# Patient Record
Sex: Female | Born: 1945 | Race: White | Hispanic: No | Marital: Single | Smoking: Current every day smoker
Health system: Southern US, Community
[De-identification: ages and names within clinical notes are randomized; demographics above are authoritative.]

## PROBLEM LIST (undated history)

## (undated) DIAGNOSIS — C439 Malignant melanoma of skin, unspecified: Secondary | ICD-10-CM

## (undated) DIAGNOSIS — I4891 Unspecified atrial fibrillation: Secondary | ICD-10-CM

## (undated) DIAGNOSIS — K589 Irritable bowel syndrome without diarrhea: Secondary | ICD-10-CM

## (undated) DIAGNOSIS — K219 Gastro-esophageal reflux disease without esophagitis: Secondary | ICD-10-CM

## (undated) DIAGNOSIS — I7091 Generalized atherosclerosis: Secondary | ICD-10-CM

## (undated) DIAGNOSIS — E048 Other specified nontoxic goiter: Secondary | ICD-10-CM

## (undated) DIAGNOSIS — F32A Depression, unspecified: Secondary | ICD-10-CM

## (undated) DIAGNOSIS — I270 Primary pulmonary hypertension: Secondary | ICD-10-CM

## (undated) DIAGNOSIS — F329 Major depressive disorder, single episode, unspecified: Secondary | ICD-10-CM

## (undated) DIAGNOSIS — E119 Type 2 diabetes mellitus without complications: Secondary | ICD-10-CM

## (undated) DIAGNOSIS — I2699 Other pulmonary embolism without acute cor pulmonale: Secondary | ICD-10-CM

## (undated) DIAGNOSIS — I5022 Chronic systolic (congestive) heart failure: Secondary | ICD-10-CM

## (undated) DIAGNOSIS — I10 Essential (primary) hypertension: Secondary | ICD-10-CM

## (undated) DIAGNOSIS — R002 Palpitations: Secondary | ICD-10-CM

## (undated) DIAGNOSIS — I639 Cerebral infarction, unspecified: Secondary | ICD-10-CM

## (undated) DIAGNOSIS — G894 Chronic pain syndrome: Secondary | ICD-10-CM

## (undated) DIAGNOSIS — M353 Polymyalgia rheumatica: Secondary | ICD-10-CM

## (undated) DIAGNOSIS — I471 Supraventricular tachycardia, unspecified: Secondary | ICD-10-CM

## (undated) HISTORY — DX: Irritable bowel syndrome, unspecified: K58.9

## (undated) HISTORY — DX: Chronic systolic (congestive) heart failure: I50.22

## (undated) HISTORY — DX: Essential (primary) hypertension: I10

## (undated) HISTORY — DX: Supraventricular tachycardia, unspecified: I47.10

## (undated) HISTORY — DX: Type 2 diabetes mellitus without complications: E11.9

## (undated) HISTORY — DX: Malignant melanoma of skin, unspecified: C43.9

## (undated) HISTORY — DX: Depression, unspecified: F32.A

## (undated) HISTORY — DX: Polymyalgia rheumatica: M35.3

## (undated) HISTORY — PX: ABDOMINAL HYSTERECTOMY: SUR658

## (undated) HISTORY — DX: Chronic pain syndrome: G89.4

## (undated) HISTORY — PX: CORONARY ANGIOPLASTY WITH STENT PLACEMENT: SHX49

## (undated) HISTORY — PX: CATARACT EXTRACTION: SUR2

## (undated) HISTORY — DX: Generalized atherosclerosis: I70.91

## (undated) HISTORY — DX: Unspecified atrial fibrillation: I48.91

## (undated) HISTORY — DX: Primary pulmonary hypertension: I27.0

## (undated) HISTORY — DX: Other specified nontoxic goiter: E04.8

## (undated) HISTORY — DX: Supraventricular tachycardia: I47.1

## (undated) HISTORY — DX: Other pulmonary embolism without acute cor pulmonale: I26.99

## (undated) HISTORY — DX: Gastro-esophageal reflux disease without esophagitis: K21.9

## (undated) HISTORY — DX: Palpitations: R00.2

## (undated) HISTORY — PX: APPENDECTOMY: SHX54

## (undated) HISTORY — DX: Cerebral infarction, unspecified: I63.9

---
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
## 2001-02-17 ENCOUNTER — Encounter: Payer: Self-pay | Admitting: Emergency Medicine

## 2001-02-17 ENCOUNTER — Encounter: Admission: RE | Admit: 2001-02-17 | Discharge: 2001-02-17 | Payer: Self-pay | Admitting: Emergency Medicine

## 2011-06-24 DIAGNOSIS — H40019 Open angle with borderline findings, low risk, unspecified eye: Secondary | ICD-10-CM | POA: Diagnosis not present

## 2011-06-24 DIAGNOSIS — H2589 Other age-related cataract: Secondary | ICD-10-CM | POA: Diagnosis not present

## 2011-07-01 DIAGNOSIS — H40019 Open angle with borderline findings, low risk, unspecified eye: Secondary | ICD-10-CM | POA: Diagnosis not present

## 2011-10-11 DIAGNOSIS — R1084 Generalized abdominal pain: Secondary | ICD-10-CM | POA: Diagnosis not present

## 2011-10-12 DIAGNOSIS — K5909 Other constipation: Secondary | ICD-10-CM | POA: Diagnosis not present

## 2011-10-12 DIAGNOSIS — R1032 Left lower quadrant pain: Secondary | ICD-10-CM | POA: Diagnosis not present

## 2011-10-12 DIAGNOSIS — K5730 Diverticulosis of large intestine without perforation or abscess without bleeding: Secondary | ICD-10-CM | POA: Diagnosis not present

## 2011-10-12 DIAGNOSIS — D1809 Hemangioma of other sites: Secondary | ICD-10-CM | POA: Diagnosis not present

## 2011-10-13 DIAGNOSIS — K5732 Diverticulitis of large intestine without perforation or abscess without bleeding: Secondary | ICD-10-CM | POA: Diagnosis not present

## 2011-10-20 DIAGNOSIS — K5732 Diverticulitis of large intestine without perforation or abscess without bleeding: Secondary | ICD-10-CM | POA: Diagnosis not present

## 2011-11-18 DIAGNOSIS — K5732 Diverticulitis of large intestine without perforation or abscess without bleeding: Secondary | ICD-10-CM | POA: Diagnosis not present

## 2011-11-18 DIAGNOSIS — R935 Abnormal findings on diagnostic imaging of other abdominal regions, including retroperitoneum: Secondary | ICD-10-CM | POA: Diagnosis not present

## 2011-11-18 DIAGNOSIS — R1084 Generalized abdominal pain: Secondary | ICD-10-CM | POA: Diagnosis not present

## 2011-11-23 DIAGNOSIS — D129 Benign neoplasm of anus and anal canal: Secondary | ICD-10-CM | POA: Diagnosis not present

## 2011-11-23 DIAGNOSIS — D126 Benign neoplasm of colon, unspecified: Secondary | ICD-10-CM | POA: Diagnosis not present

## 2011-11-23 DIAGNOSIS — D128 Benign neoplasm of rectum: Secondary | ICD-10-CM | POA: Diagnosis not present

## 2011-11-23 DIAGNOSIS — Z1211 Encounter for screening for malignant neoplasm of colon: Secondary | ICD-10-CM | POA: Diagnosis not present

## 2011-11-23 DIAGNOSIS — K5730 Diverticulosis of large intestine without perforation or abscess without bleeding: Secondary | ICD-10-CM | POA: Diagnosis not present

## 2011-11-30 LAB — HM COLONOSCOPY

## 2011-12-23 DIAGNOSIS — K589 Irritable bowel syndrome without diarrhea: Secondary | ICD-10-CM | POA: Diagnosis not present

## 2011-12-23 DIAGNOSIS — R1084 Generalized abdominal pain: Secondary | ICD-10-CM | POA: Diagnosis not present

## 2011-12-23 DIAGNOSIS — K5730 Diverticulosis of large intestine without perforation or abscess without bleeding: Secondary | ICD-10-CM | POA: Diagnosis not present

## 2012-01-08 DIAGNOSIS — H40019 Open angle with borderline findings, low risk, unspecified eye: Secondary | ICD-10-CM | POA: Diagnosis not present

## 2012-01-08 DIAGNOSIS — H5200 Hypermetropia, unspecified eye: Secondary | ICD-10-CM | POA: Diagnosis not present

## 2012-01-20 DIAGNOSIS — R112 Nausea with vomiting, unspecified: Secondary | ICD-10-CM | POA: Diagnosis not present

## 2012-01-20 DIAGNOSIS — R1013 Epigastric pain: Secondary | ICD-10-CM | POA: Diagnosis not present

## 2012-01-20 DIAGNOSIS — K589 Irritable bowel syndrome without diarrhea: Secondary | ICD-10-CM | POA: Diagnosis not present

## 2012-02-10 DIAGNOSIS — K2900 Acute gastritis without bleeding: Secondary | ICD-10-CM | POA: Diagnosis not present

## 2012-02-10 DIAGNOSIS — R1013 Epigastric pain: Secondary | ICD-10-CM | POA: Diagnosis not present

## 2012-02-10 DIAGNOSIS — K2960 Other gastritis without bleeding: Secondary | ICD-10-CM | POA: Diagnosis not present

## 2012-02-12 DIAGNOSIS — R1013 Epigastric pain: Secondary | ICD-10-CM | POA: Diagnosis not present

## 2012-05-06 DIAGNOSIS — M545 Low back pain, unspecified: Secondary | ICD-10-CM | POA: Diagnosis not present

## 2012-05-16 DIAGNOSIS — K5732 Diverticulitis of large intestine without perforation or abscess without bleeding: Secondary | ICD-10-CM | POA: Diagnosis not present

## 2012-05-16 DIAGNOSIS — K589 Irritable bowel syndrome without diarrhea: Secondary | ICD-10-CM | POA: Diagnosis not present

## 2012-05-16 DIAGNOSIS — K2900 Acute gastritis without bleeding: Secondary | ICD-10-CM | POA: Diagnosis not present

## 2012-05-23 DIAGNOSIS — K5732 Diverticulitis of large intestine without perforation or abscess without bleeding: Secondary | ICD-10-CM | POA: Diagnosis not present

## 2012-07-27 DIAGNOSIS — H40019 Open angle with borderline findings, low risk, unspecified eye: Secondary | ICD-10-CM | POA: Diagnosis not present

## 2013-01-27 DIAGNOSIS — H40019 Open angle with borderline findings, low risk, unspecified eye: Secondary | ICD-10-CM | POA: Diagnosis not present

## 2013-02-07 DIAGNOSIS — Z23 Encounter for immunization: Secondary | ICD-10-CM | POA: Diagnosis not present

## 2013-02-21 DIAGNOSIS — K589 Irritable bowel syndrome without diarrhea: Secondary | ICD-10-CM | POA: Diagnosis not present

## 2013-02-21 DIAGNOSIS — L908 Other atrophic disorders of skin: Secondary | ICD-10-CM | POA: Diagnosis not present

## 2013-02-21 DIAGNOSIS — M51379 Other intervertebral disc degeneration, lumbosacral region without mention of lumbar back pain or lower extremity pain: Secondary | ICD-10-CM | POA: Diagnosis not present

## 2013-02-21 DIAGNOSIS — Z13220 Encounter for screening for lipoid disorders: Secondary | ICD-10-CM | POA: Diagnosis not present

## 2013-02-21 DIAGNOSIS — R5381 Other malaise: Secondary | ICD-10-CM | POA: Diagnosis not present

## 2013-02-21 DIAGNOSIS — M2550 Pain in unspecified joint: Secondary | ICD-10-CM | POA: Diagnosis not present

## 2013-02-21 DIAGNOSIS — Z23 Encounter for immunization: Secondary | ICD-10-CM | POA: Diagnosis not present

## 2013-02-21 DIAGNOSIS — Z136 Encounter for screening for cardiovascular disorders: Secondary | ICD-10-CM | POA: Diagnosis not present

## 2013-02-21 DIAGNOSIS — K5730 Diverticulosis of large intestine without perforation or abscess without bleeding: Secondary | ICD-10-CM | POA: Diagnosis not present

## 2013-02-21 DIAGNOSIS — M25519 Pain in unspecified shoulder: Secondary | ICD-10-CM | POA: Diagnosis not present

## 2013-02-21 DIAGNOSIS — M545 Low back pain, unspecified: Secondary | ICD-10-CM | POA: Diagnosis not present

## 2013-02-21 DIAGNOSIS — M5137 Other intervertebral disc degeneration, lumbosacral region: Secondary | ICD-10-CM | POA: Diagnosis not present

## 2013-02-21 DIAGNOSIS — IMO0001 Reserved for inherently not codable concepts without codable children: Secondary | ICD-10-CM | POA: Diagnosis not present

## 2013-03-20 DIAGNOSIS — M25519 Pain in unspecified shoulder: Secondary | ICD-10-CM | POA: Diagnosis not present

## 2013-03-20 DIAGNOSIS — E7800 Pure hypercholesterolemia, unspecified: Secondary | ICD-10-CM | POA: Diagnosis not present

## 2013-03-20 DIAGNOSIS — M545 Low back pain, unspecified: Secondary | ICD-10-CM | POA: Diagnosis not present

## 2013-03-20 DIAGNOSIS — R5381 Other malaise: Secondary | ICD-10-CM | POA: Diagnosis not present

## 2013-03-20 DIAGNOSIS — R42 Dizziness and giddiness: Secondary | ICD-10-CM | POA: Diagnosis not present

## 2013-04-04 DIAGNOSIS — Z13820 Encounter for screening for osteoporosis: Secondary | ICD-10-CM | POA: Diagnosis not present

## 2013-04-04 DIAGNOSIS — Z0000 Encounter for general adult medical examination without abnormal findings: Secondary | ICD-10-CM | POA: Diagnosis not present

## 2013-04-04 DIAGNOSIS — Z1289 Encounter for screening for malignant neoplasm of other sites: Secondary | ICD-10-CM | POA: Diagnosis not present

## 2013-04-07 DIAGNOSIS — M545 Low back pain, unspecified: Secondary | ICD-10-CM | POA: Diagnosis not present

## 2013-04-07 DIAGNOSIS — M2550 Pain in unspecified joint: Secondary | ICD-10-CM | POA: Diagnosis not present

## 2013-04-07 DIAGNOSIS — IMO0001 Reserved for inherently not codable concepts without codable children: Secondary | ICD-10-CM | POA: Diagnosis not present

## 2013-04-07 DIAGNOSIS — M5030 Other cervical disc degeneration, unspecified cervical region: Secondary | ICD-10-CM | POA: Diagnosis not present

## 2013-04-07 DIAGNOSIS — M949 Disorder of cartilage, unspecified: Secondary | ICD-10-CM | POA: Diagnosis not present

## 2013-04-07 DIAGNOSIS — M259 Joint disorder, unspecified: Secondary | ICD-10-CM | POA: Diagnosis not present

## 2013-04-07 DIAGNOSIS — M899 Disorder of bone, unspecified: Secondary | ICD-10-CM | POA: Diagnosis not present

## 2013-04-26 DIAGNOSIS — R404 Transient alteration of awareness: Secondary | ICD-10-CM | POA: Diagnosis not present

## 2013-04-26 DIAGNOSIS — I4891 Unspecified atrial fibrillation: Secondary | ICD-10-CM | POA: Diagnosis not present

## 2013-04-26 DIAGNOSIS — R55 Syncope and collapse: Secondary | ICD-10-CM | POA: Diagnosis not present

## 2013-04-26 DIAGNOSIS — R51 Headache: Secondary | ICD-10-CM | POA: Diagnosis not present

## 2013-04-26 DIAGNOSIS — I739 Peripheral vascular disease, unspecified: Secondary | ICD-10-CM | POA: Diagnosis not present

## 2013-04-26 DIAGNOSIS — R0789 Other chest pain: Secondary | ICD-10-CM | POA: Diagnosis not present

## 2013-04-26 DIAGNOSIS — I10 Essential (primary) hypertension: Secondary | ICD-10-CM | POA: Diagnosis not present

## 2013-04-26 DIAGNOSIS — R079 Chest pain, unspecified: Secondary | ICD-10-CM | POA: Diagnosis not present

## 2013-04-26 DIAGNOSIS — I498 Other specified cardiac arrhythmias: Secondary | ICD-10-CM | POA: Diagnosis not present

## 2013-04-26 DIAGNOSIS — I471 Supraventricular tachycardia: Secondary | ICD-10-CM | POA: Diagnosis not present

## 2013-04-26 DIAGNOSIS — F17200 Nicotine dependence, unspecified, uncomplicated: Secondary | ICD-10-CM | POA: Diagnosis not present

## 2013-04-26 DIAGNOSIS — S0990XA Unspecified injury of head, initial encounter: Secondary | ICD-10-CM | POA: Diagnosis not present

## 2013-04-26 DIAGNOSIS — J449 Chronic obstructive pulmonary disease, unspecified: Secondary | ICD-10-CM | POA: Diagnosis not present

## 2013-04-27 DIAGNOSIS — I739 Peripheral vascular disease, unspecified: Secondary | ICD-10-CM | POA: Diagnosis not present

## 2013-04-27 DIAGNOSIS — I10 Essential (primary) hypertension: Secondary | ICD-10-CM | POA: Diagnosis not present

## 2013-04-27 DIAGNOSIS — I498 Other specified cardiac arrhythmias: Secondary | ICD-10-CM | POA: Diagnosis not present

## 2013-04-27 DIAGNOSIS — F17200 Nicotine dependence, unspecified, uncomplicated: Secondary | ICD-10-CM | POA: Diagnosis not present

## 2013-04-27 DIAGNOSIS — I359 Nonrheumatic aortic valve disorder, unspecified: Secondary | ICD-10-CM | POA: Diagnosis not present

## 2013-04-27 DIAGNOSIS — I369 Nonrheumatic tricuspid valve disorder, unspecified: Secondary | ICD-10-CM | POA: Diagnosis not present

## 2013-05-02 DIAGNOSIS — M1990 Unspecified osteoarthritis, unspecified site: Secondary | ICD-10-CM | POA: Diagnosis not present

## 2013-05-02 DIAGNOSIS — M353 Polymyalgia rheumatica: Secondary | ICD-10-CM | POA: Diagnosis not present

## 2013-05-02 DIAGNOSIS — M2550 Pain in unspecified joint: Secondary | ICD-10-CM | POA: Diagnosis not present

## 2013-05-02 DIAGNOSIS — M545 Low back pain, unspecified: Secondary | ICD-10-CM | POA: Diagnosis not present

## 2013-05-04 DIAGNOSIS — I471 Supraventricular tachycardia: Secondary | ICD-10-CM | POA: Diagnosis not present

## 2013-05-04 DIAGNOSIS — E782 Mixed hyperlipidemia: Secondary | ICD-10-CM | POA: Diagnosis not present

## 2013-05-04 DIAGNOSIS — M353 Polymyalgia rheumatica: Secondary | ICD-10-CM | POA: Diagnosis not present

## 2013-05-04 DIAGNOSIS — R0989 Other specified symptoms and signs involving the circulatory and respiratory systems: Secondary | ICD-10-CM | POA: Diagnosis not present

## 2013-05-16 DIAGNOSIS — E785 Hyperlipidemia, unspecified: Secondary | ICD-10-CM | POA: Diagnosis not present

## 2013-05-16 DIAGNOSIS — I498 Other specified cardiac arrhythmias: Secondary | ICD-10-CM | POA: Diagnosis not present

## 2013-05-16 DIAGNOSIS — F17200 Nicotine dependence, unspecified, uncomplicated: Secondary | ICD-10-CM | POA: Diagnosis not present

## 2013-05-26 DIAGNOSIS — I498 Other specified cardiac arrhythmias: Secondary | ICD-10-CM | POA: Diagnosis not present

## 2013-05-29 DIAGNOSIS — M1990 Unspecified osteoarthritis, unspecified site: Secondary | ICD-10-CM | POA: Diagnosis not present

## 2013-05-29 DIAGNOSIS — M353 Polymyalgia rheumatica: Secondary | ICD-10-CM | POA: Diagnosis not present

## 2013-05-29 DIAGNOSIS — M545 Low back pain, unspecified: Secondary | ICD-10-CM | POA: Diagnosis not present

## 2013-05-29 DIAGNOSIS — M2550 Pain in unspecified joint: Secondary | ICD-10-CM | POA: Diagnosis not present

## 2013-05-30 DIAGNOSIS — K589 Irritable bowel syndrome without diarrhea: Secondary | ICD-10-CM | POA: Diagnosis not present

## 2013-05-30 DIAGNOSIS — K2900 Acute gastritis without bleeding: Secondary | ICD-10-CM | POA: Diagnosis not present

## 2013-06-02 DIAGNOSIS — I471 Supraventricular tachycardia: Secondary | ICD-10-CM | POA: Diagnosis not present

## 2013-06-02 DIAGNOSIS — E782 Mixed hyperlipidemia: Secondary | ICD-10-CM | POA: Diagnosis not present

## 2013-06-02 DIAGNOSIS — R0989 Other specified symptoms and signs involving the circulatory and respiratory systems: Secondary | ICD-10-CM | POA: Diagnosis not present

## 2013-06-02 DIAGNOSIS — M353 Polymyalgia rheumatica: Secondary | ICD-10-CM | POA: Diagnosis not present

## 2013-06-13 DIAGNOSIS — R079 Chest pain, unspecified: Secondary | ICD-10-CM | POA: Diagnosis not present

## 2013-06-26 DIAGNOSIS — M2550 Pain in unspecified joint: Secondary | ICD-10-CM | POA: Diagnosis not present

## 2013-06-26 DIAGNOSIS — M545 Low back pain, unspecified: Secondary | ICD-10-CM | POA: Diagnosis not present

## 2013-06-26 DIAGNOSIS — M353 Polymyalgia rheumatica: Secondary | ICD-10-CM | POA: Diagnosis not present

## 2013-06-26 DIAGNOSIS — M1990 Unspecified osteoarthritis, unspecified site: Secondary | ICD-10-CM | POA: Diagnosis not present

## 2013-06-29 DIAGNOSIS — I10 Essential (primary) hypertension: Secondary | ICD-10-CM | POA: Diagnosis not present

## 2013-06-29 DIAGNOSIS — R079 Chest pain, unspecified: Secondary | ICD-10-CM | POA: Diagnosis not present

## 2013-06-29 DIAGNOSIS — I498 Other specified cardiac arrhythmias: Secondary | ICD-10-CM | POA: Diagnosis not present

## 2013-06-29 DIAGNOSIS — E785 Hyperlipidemia, unspecified: Secondary | ICD-10-CM | POA: Diagnosis not present

## 2013-07-13 DIAGNOSIS — I471 Supraventricular tachycardia: Secondary | ICD-10-CM | POA: Diagnosis not present

## 2013-07-13 DIAGNOSIS — Z79899 Other long term (current) drug therapy: Secondary | ICD-10-CM | POA: Diagnosis not present

## 2013-07-13 DIAGNOSIS — M353 Polymyalgia rheumatica: Secondary | ICD-10-CM | POA: Diagnosis not present

## 2013-07-13 DIAGNOSIS — E782 Mixed hyperlipidemia: Secondary | ICD-10-CM | POA: Diagnosis not present

## 2013-07-13 DIAGNOSIS — Z13820 Encounter for screening for osteoporosis: Secondary | ICD-10-CM | POA: Diagnosis not present

## 2013-07-13 DIAGNOSIS — R0989 Other specified symptoms and signs involving the circulatory and respiratory systems: Secondary | ICD-10-CM | POA: Diagnosis not present

## 2013-07-13 DIAGNOSIS — I739 Peripheral vascular disease, unspecified: Secondary | ICD-10-CM | POA: Diagnosis not present

## 2013-07-13 DIAGNOSIS — M5137 Other intervertebral disc degeneration, lumbosacral region: Secondary | ICD-10-CM | POA: Diagnosis not present

## 2013-07-13 DIAGNOSIS — E7800 Pure hypercholesterolemia, unspecified: Secondary | ICD-10-CM | POA: Diagnosis not present

## 2013-07-13 DIAGNOSIS — M545 Low back pain, unspecified: Secondary | ICD-10-CM | POA: Diagnosis not present

## 2013-07-13 DIAGNOSIS — M47817 Spondylosis without myelopathy or radiculopathy, lumbosacral region: Secondary | ICD-10-CM | POA: Diagnosis not present

## 2013-07-20 DIAGNOSIS — M353 Polymyalgia rheumatica: Secondary | ICD-10-CM | POA: Diagnosis not present

## 2013-07-21 DIAGNOSIS — R0989 Other specified symptoms and signs involving the circulatory and respiratory systems: Secondary | ICD-10-CM | POA: Diagnosis not present

## 2013-07-21 DIAGNOSIS — I745 Embolism and thrombosis of iliac artery: Secondary | ICD-10-CM | POA: Diagnosis not present

## 2013-07-21 DIAGNOSIS — I743 Embolism and thrombosis of arteries of the lower extremities: Secondary | ICD-10-CM | POA: Diagnosis not present

## 2013-07-21 DIAGNOSIS — I739 Peripheral vascular disease, unspecified: Secondary | ICD-10-CM | POA: Diagnosis not present

## 2013-07-21 DIAGNOSIS — I708 Atherosclerosis of other arteries: Secondary | ICD-10-CM | POA: Diagnosis not present

## 2013-07-21 DIAGNOSIS — I6529 Occlusion and stenosis of unspecified carotid artery: Secondary | ICD-10-CM | POA: Diagnosis not present

## 2013-07-24 DIAGNOSIS — M810 Age-related osteoporosis without current pathological fracture: Secondary | ICD-10-CM | POA: Diagnosis not present

## 2013-07-24 DIAGNOSIS — R0989 Other specified symptoms and signs involving the circulatory and respiratory systems: Secondary | ICD-10-CM | POA: Diagnosis not present

## 2013-07-24 DIAGNOSIS — Z13820 Encounter for screening for osteoporosis: Secondary | ICD-10-CM | POA: Diagnosis not present

## 2013-07-25 DIAGNOSIS — K219 Gastro-esophageal reflux disease without esophagitis: Secondary | ICD-10-CM | POA: Diagnosis not present

## 2013-07-25 DIAGNOSIS — Z886 Allergy status to analgesic agent status: Secondary | ICD-10-CM | POA: Diagnosis not present

## 2013-07-25 DIAGNOSIS — Z85828 Personal history of other malignant neoplasm of skin: Secondary | ICD-10-CM | POA: Diagnosis not present

## 2013-07-25 DIAGNOSIS — R55 Syncope and collapse: Secondary | ICD-10-CM | POA: Diagnosis not present

## 2013-07-25 DIAGNOSIS — M353 Polymyalgia rheumatica: Secondary | ICD-10-CM | POA: Diagnosis not present

## 2013-07-25 DIAGNOSIS — E785 Hyperlipidemia, unspecified: Secondary | ICD-10-CM | POA: Diagnosis not present

## 2013-07-25 DIAGNOSIS — I10 Essential (primary) hypertension: Secondary | ICD-10-CM | POA: Diagnosis not present

## 2013-07-25 DIAGNOSIS — Z79899 Other long term (current) drug therapy: Secondary | ICD-10-CM | POA: Diagnosis not present

## 2013-07-25 DIAGNOSIS — J449 Chronic obstructive pulmonary disease, unspecified: Secondary | ICD-10-CM | POA: Diagnosis not present

## 2013-07-25 DIAGNOSIS — I519 Heart disease, unspecified: Secondary | ICD-10-CM | POA: Diagnosis not present

## 2013-07-25 DIAGNOSIS — Z7982 Long term (current) use of aspirin: Secondary | ICD-10-CM | POA: Diagnosis not present

## 2013-07-25 DIAGNOSIS — M1990 Unspecified osteoarthritis, unspecified site: Secondary | ICD-10-CM | POA: Diagnosis not present

## 2013-07-25 DIAGNOSIS — I498 Other specified cardiac arrhythmias: Secondary | ICD-10-CM | POA: Diagnosis not present

## 2013-07-25 DIAGNOSIS — F17200 Nicotine dependence, unspecified, uncomplicated: Secondary | ICD-10-CM | POA: Diagnosis not present

## 2013-07-25 DIAGNOSIS — F411 Generalized anxiety disorder: Secondary | ICD-10-CM | POA: Diagnosis not present

## 2013-07-26 DIAGNOSIS — K219 Gastro-esophageal reflux disease without esophagitis: Secondary | ICD-10-CM | POA: Diagnosis not present

## 2013-07-26 DIAGNOSIS — Z79899 Other long term (current) drug therapy: Secondary | ICD-10-CM | POA: Diagnosis not present

## 2013-07-26 DIAGNOSIS — J449 Chronic obstructive pulmonary disease, unspecified: Secondary | ICD-10-CM | POA: Diagnosis not present

## 2013-07-26 DIAGNOSIS — I498 Other specified cardiac arrhythmias: Secondary | ICD-10-CM | POA: Diagnosis not present

## 2013-07-26 DIAGNOSIS — R55 Syncope and collapse: Secondary | ICD-10-CM | POA: Diagnosis not present

## 2013-07-26 DIAGNOSIS — E785 Hyperlipidemia, unspecified: Secondary | ICD-10-CM | POA: Diagnosis not present

## 2013-07-26 DIAGNOSIS — I471 Supraventricular tachycardia: Secondary | ICD-10-CM | POA: Diagnosis not present

## 2013-07-26 DIAGNOSIS — M1990 Unspecified osteoarthritis, unspecified site: Secondary | ICD-10-CM | POA: Diagnosis not present

## 2013-07-26 DIAGNOSIS — I10 Essential (primary) hypertension: Secondary | ICD-10-CM | POA: Diagnosis not present

## 2013-07-27 DIAGNOSIS — R55 Syncope and collapse: Secondary | ICD-10-CM | POA: Diagnosis not present

## 2013-07-27 DIAGNOSIS — I471 Supraventricular tachycardia: Secondary | ICD-10-CM | POA: Diagnosis not present

## 2013-08-10 DIAGNOSIS — E785 Hyperlipidemia, unspecified: Secondary | ICD-10-CM | POA: Diagnosis not present

## 2013-08-10 DIAGNOSIS — I70219 Atherosclerosis of native arteries of extremities with intermittent claudication, unspecified extremity: Secondary | ICD-10-CM | POA: Diagnosis not present

## 2013-08-10 DIAGNOSIS — I771 Stricture of artery: Secondary | ICD-10-CM | POA: Diagnosis not present

## 2013-08-10 DIAGNOSIS — I10 Essential (primary) hypertension: Secondary | ICD-10-CM | POA: Diagnosis not present

## 2013-08-10 DIAGNOSIS — I6529 Occlusion and stenosis of unspecified carotid artery: Secondary | ICD-10-CM | POA: Diagnosis not present

## 2013-08-14 DIAGNOSIS — H40019 Open angle with borderline findings, low risk, unspecified eye: Secondary | ICD-10-CM | POA: Diagnosis not present

## 2013-08-22 DIAGNOSIS — M545 Low back pain, unspecified: Secondary | ICD-10-CM | POA: Diagnosis not present

## 2013-08-22 DIAGNOSIS — M353 Polymyalgia rheumatica: Secondary | ICD-10-CM | POA: Diagnosis not present

## 2013-08-22 DIAGNOSIS — M1990 Unspecified osteoarthritis, unspecified site: Secondary | ICD-10-CM | POA: Diagnosis not present

## 2013-08-22 DIAGNOSIS — M2550 Pain in unspecified joint: Secondary | ICD-10-CM | POA: Diagnosis not present

## 2013-08-29 DIAGNOSIS — I471 Supraventricular tachycardia: Secondary | ICD-10-CM | POA: Diagnosis not present

## 2013-08-29 DIAGNOSIS — M353 Polymyalgia rheumatica: Secondary | ICD-10-CM | POA: Diagnosis not present

## 2013-08-31 DIAGNOSIS — I70219 Atherosclerosis of native arteries of extremities with intermittent claudication, unspecified extremity: Secondary | ICD-10-CM | POA: Diagnosis not present

## 2013-08-31 DIAGNOSIS — I739 Peripheral vascular disease, unspecified: Secondary | ICD-10-CM | POA: Diagnosis not present

## 2013-09-05 DIAGNOSIS — E785 Hyperlipidemia, unspecified: Secondary | ICD-10-CM | POA: Diagnosis not present

## 2013-09-05 DIAGNOSIS — I498 Other specified cardiac arrhythmias: Secondary | ICD-10-CM | POA: Diagnosis not present

## 2013-09-05 DIAGNOSIS — I70219 Atherosclerosis of native arteries of extremities with intermittent claudication, unspecified extremity: Secondary | ICD-10-CM | POA: Diagnosis not present

## 2013-09-05 DIAGNOSIS — I10 Essential (primary) hypertension: Secondary | ICD-10-CM | POA: Diagnosis not present

## 2013-09-05 DIAGNOSIS — R079 Chest pain, unspecified: Secondary | ICD-10-CM | POA: Diagnosis not present

## 2013-09-05 DIAGNOSIS — J449 Chronic obstructive pulmonary disease, unspecified: Secondary | ICD-10-CM | POA: Diagnosis not present

## 2013-09-11 DIAGNOSIS — I739 Peripheral vascular disease, unspecified: Secondary | ICD-10-CM | POA: Diagnosis not present

## 2013-09-11 DIAGNOSIS — K922 Gastrointestinal hemorrhage, unspecified: Secondary | ICD-10-CM | POA: Diagnosis not present

## 2013-09-11 DIAGNOSIS — I6529 Occlusion and stenosis of unspecified carotid artery: Secondary | ICD-10-CM | POA: Diagnosis not present

## 2013-09-11 DIAGNOSIS — I771 Stricture of artery: Secondary | ICD-10-CM | POA: Diagnosis not present

## 2013-09-11 DIAGNOSIS — Z8719 Personal history of other diseases of the digestive system: Secondary | ICD-10-CM | POA: Diagnosis not present

## 2013-09-11 DIAGNOSIS — I70219 Atherosclerosis of native arteries of extremities with intermittent claudication, unspecified extremity: Secondary | ICD-10-CM | POA: Diagnosis not present

## 2013-09-18 DIAGNOSIS — M545 Low back pain, unspecified: Secondary | ICD-10-CM | POA: Diagnosis not present

## 2013-09-18 DIAGNOSIS — M5412 Radiculopathy, cervical region: Secondary | ICD-10-CM | POA: Diagnosis not present

## 2013-09-18 DIAGNOSIS — M79609 Pain in unspecified limb: Secondary | ICD-10-CM | POA: Diagnosis not present

## 2013-09-18 DIAGNOSIS — M542 Cervicalgia: Secondary | ICD-10-CM | POA: Diagnosis not present

## 2013-09-19 DIAGNOSIS — I771 Stricture of artery: Secondary | ICD-10-CM | POA: Diagnosis not present

## 2013-09-19 DIAGNOSIS — R935 Abnormal findings on diagnostic imaging of other abdominal regions, including retroperitoneum: Secondary | ICD-10-CM | POA: Diagnosis not present

## 2013-09-19 DIAGNOSIS — I2699 Other pulmonary embolism without acute cor pulmonale: Secondary | ICD-10-CM | POA: Diagnosis not present

## 2013-09-19 DIAGNOSIS — I658 Occlusion and stenosis of other precerebral arteries: Secondary | ICD-10-CM | POA: Diagnosis not present

## 2013-09-19 DIAGNOSIS — I700 Atherosclerosis of aorta: Secondary | ICD-10-CM | POA: Diagnosis not present

## 2013-09-21 DIAGNOSIS — I771 Stricture of artery: Secondary | ICD-10-CM | POA: Diagnosis not present

## 2013-09-21 DIAGNOSIS — I10 Essential (primary) hypertension: Secondary | ICD-10-CM | POA: Diagnosis not present

## 2013-09-21 DIAGNOSIS — E785 Hyperlipidemia, unspecified: Secondary | ICD-10-CM | POA: Diagnosis not present

## 2013-09-28 DIAGNOSIS — E785 Hyperlipidemia, unspecified: Secondary | ICD-10-CM | POA: Diagnosis not present

## 2013-09-28 DIAGNOSIS — M353 Polymyalgia rheumatica: Secondary | ICD-10-CM | POA: Diagnosis not present

## 2013-09-28 DIAGNOSIS — J449 Chronic obstructive pulmonary disease, unspecified: Secondary | ICD-10-CM | POA: Diagnosis not present

## 2013-09-28 DIAGNOSIS — I10 Essential (primary) hypertension: Secondary | ICD-10-CM | POA: Diagnosis not present

## 2013-09-28 DIAGNOSIS — I771 Stricture of artery: Secondary | ICD-10-CM | POA: Diagnosis not present

## 2013-09-28 DIAGNOSIS — I498 Other specified cardiac arrhythmias: Secondary | ICD-10-CM | POA: Diagnosis not present

## 2013-10-04 DIAGNOSIS — J449 Chronic obstructive pulmonary disease, unspecified: Secondary | ICD-10-CM | POA: Diagnosis not present

## 2013-10-04 DIAGNOSIS — K219 Gastro-esophageal reflux disease without esophagitis: Secondary | ICD-10-CM | POA: Diagnosis not present

## 2013-10-04 DIAGNOSIS — M1990 Unspecified osteoarthritis, unspecified site: Secondary | ICD-10-CM | POA: Diagnosis not present

## 2013-10-04 DIAGNOSIS — F411 Generalized anxiety disorder: Secondary | ICD-10-CM | POA: Diagnosis not present

## 2013-10-04 DIAGNOSIS — E785 Hyperlipidemia, unspecified: Secondary | ICD-10-CM | POA: Diagnosis not present

## 2013-10-04 DIAGNOSIS — I70219 Atherosclerosis of native arteries of extremities with intermittent claudication, unspecified extremity: Secondary | ICD-10-CM | POA: Diagnosis not present

## 2013-10-04 DIAGNOSIS — M353 Polymyalgia rheumatica: Secondary | ICD-10-CM | POA: Diagnosis not present

## 2013-10-04 DIAGNOSIS — I771 Stricture of artery: Secondary | ICD-10-CM | POA: Diagnosis not present

## 2013-10-11 DIAGNOSIS — I771 Stricture of artery: Secondary | ICD-10-CM | POA: Diagnosis not present

## 2013-10-11 DIAGNOSIS — M353 Polymyalgia rheumatica: Secondary | ICD-10-CM | POA: Diagnosis present

## 2013-10-11 DIAGNOSIS — Z85820 Personal history of malignant melanoma of skin: Secondary | ICD-10-CM | POA: Diagnosis not present

## 2013-10-11 DIAGNOSIS — E785 Hyperlipidemia, unspecified: Secondary | ICD-10-CM | POA: Diagnosis present

## 2013-10-11 DIAGNOSIS — I70219 Atherosclerosis of native arteries of extremities with intermittent claudication, unspecified extremity: Secondary | ICD-10-CM | POA: Diagnosis not present

## 2013-10-11 DIAGNOSIS — M1990 Unspecified osteoarthritis, unspecified site: Secondary | ICD-10-CM | POA: Diagnosis present

## 2013-10-11 DIAGNOSIS — J449 Chronic obstructive pulmonary disease, unspecified: Secondary | ICD-10-CM | POA: Diagnosis present

## 2013-10-11 DIAGNOSIS — K219 Gastro-esophageal reflux disease without esophagitis: Secondary | ICD-10-CM | POA: Diagnosis present

## 2013-10-11 DIAGNOSIS — F411 Generalized anxiety disorder: Secondary | ICD-10-CM | POA: Diagnosis present

## 2013-10-17 DIAGNOSIS — R0602 Shortness of breath: Secondary | ICD-10-CM | POA: Diagnosis not present

## 2013-10-17 DIAGNOSIS — R5381 Other malaise: Secondary | ICD-10-CM | POA: Diagnosis not present

## 2013-10-17 DIAGNOSIS — R5383 Other fatigue: Secondary | ICD-10-CM | POA: Diagnosis not present

## 2013-10-17 DIAGNOSIS — R609 Edema, unspecified: Secondary | ICD-10-CM | POA: Diagnosis not present

## 2013-10-23 DIAGNOSIS — I70219 Atherosclerosis of native arteries of extremities with intermittent claudication, unspecified extremity: Secondary | ICD-10-CM | POA: Diagnosis not present

## 2013-10-23 DIAGNOSIS — I739 Peripheral vascular disease, unspecified: Secondary | ICD-10-CM | POA: Diagnosis not present

## 2013-10-23 DIAGNOSIS — I6529 Occlusion and stenosis of unspecified carotid artery: Secondary | ICD-10-CM | POA: Diagnosis not present

## 2013-10-23 DIAGNOSIS — I771 Stricture of artery: Secondary | ICD-10-CM | POA: Diagnosis not present

## 2013-11-01 DIAGNOSIS — M545 Low back pain, unspecified: Secondary | ICD-10-CM | POA: Diagnosis not present

## 2013-11-01 DIAGNOSIS — R5383 Other fatigue: Secondary | ICD-10-CM | POA: Diagnosis not present

## 2013-11-01 DIAGNOSIS — M1990 Unspecified osteoarthritis, unspecified site: Secondary | ICD-10-CM | POA: Diagnosis not present

## 2013-11-01 DIAGNOSIS — M353 Polymyalgia rheumatica: Secondary | ICD-10-CM | POA: Diagnosis not present

## 2013-11-01 DIAGNOSIS — M2550 Pain in unspecified joint: Secondary | ICD-10-CM | POA: Diagnosis not present

## 2013-11-01 DIAGNOSIS — R5381 Other malaise: Secondary | ICD-10-CM | POA: Diagnosis not present

## 2013-11-07 DIAGNOSIS — I70219 Atherosclerosis of native arteries of extremities with intermittent claudication, unspecified extremity: Secondary | ICD-10-CM | POA: Diagnosis not present

## 2013-11-07 DIAGNOSIS — J449 Chronic obstructive pulmonary disease, unspecified: Secondary | ICD-10-CM | POA: Diagnosis not present

## 2013-11-07 DIAGNOSIS — I771 Stricture of artery: Secondary | ICD-10-CM | POA: Diagnosis not present

## 2013-11-07 DIAGNOSIS — M353 Polymyalgia rheumatica: Secondary | ICD-10-CM | POA: Diagnosis not present

## 2013-11-07 DIAGNOSIS — I10 Essential (primary) hypertension: Secondary | ICD-10-CM | POA: Diagnosis not present

## 2013-11-07 DIAGNOSIS — I498 Other specified cardiac arrhythmias: Secondary | ICD-10-CM | POA: Diagnosis not present

## 2013-11-12 DIAGNOSIS — S42309A Unspecified fracture of shaft of humerus, unspecified arm, initial encounter for closed fracture: Secondary | ICD-10-CM | POA: Diagnosis not present

## 2013-11-15 DIAGNOSIS — S42209A Unspecified fracture of upper end of unspecified humerus, initial encounter for closed fracture: Secondary | ICD-10-CM | POA: Diagnosis not present

## 2013-11-15 DIAGNOSIS — M25519 Pain in unspecified shoulder: Secondary | ICD-10-CM | POA: Diagnosis not present

## 2013-11-15 DIAGNOSIS — M25619 Stiffness of unspecified shoulder, not elsewhere classified: Secondary | ICD-10-CM | POA: Diagnosis not present

## 2013-11-15 DIAGNOSIS — S40019A Contusion of unspecified shoulder, initial encounter: Secondary | ICD-10-CM | POA: Diagnosis not present

## 2013-11-21 DIAGNOSIS — I771 Stricture of artery: Secondary | ICD-10-CM | POA: Diagnosis not present

## 2013-11-21 DIAGNOSIS — I10 Essential (primary) hypertension: Secondary | ICD-10-CM | POA: Diagnosis not present

## 2013-11-21 DIAGNOSIS — I739 Peripheral vascular disease, unspecified: Secondary | ICD-10-CM | POA: Diagnosis not present

## 2013-11-21 DIAGNOSIS — M353 Polymyalgia rheumatica: Secondary | ICD-10-CM | POA: Diagnosis not present

## 2013-11-21 DIAGNOSIS — E785 Hyperlipidemia, unspecified: Secondary | ICD-10-CM | POA: Diagnosis not present

## 2013-11-21 DIAGNOSIS — J449 Chronic obstructive pulmonary disease, unspecified: Secondary | ICD-10-CM | POA: Diagnosis not present

## 2013-11-28 DIAGNOSIS — R0602 Shortness of breath: Secondary | ICD-10-CM | POA: Diagnosis not present

## 2013-11-28 DIAGNOSIS — E7800 Pure hypercholesterolemia, unspecified: Secondary | ICD-10-CM | POA: Diagnosis not present

## 2013-11-28 DIAGNOSIS — M353 Polymyalgia rheumatica: Secondary | ICD-10-CM | POA: Diagnosis not present

## 2013-11-28 DIAGNOSIS — R0601 Orthopnea: Secondary | ICD-10-CM | POA: Diagnosis not present

## 2013-11-28 DIAGNOSIS — Z79899 Other long term (current) drug therapy: Secondary | ICD-10-CM | POA: Diagnosis not present

## 2013-11-28 DIAGNOSIS — W010XXA Fall on same level from slipping, tripping and stumbling without subsequent striking against object, initial encounter: Secondary | ICD-10-CM | POA: Diagnosis not present

## 2013-11-28 DIAGNOSIS — R609 Edema, unspecified: Secondary | ICD-10-CM | POA: Diagnosis not present

## 2013-11-28 DIAGNOSIS — IMO0001 Reserved for inherently not codable concepts without codable children: Secondary | ICD-10-CM | POA: Diagnosis not present

## 2013-11-29 DIAGNOSIS — M25519 Pain in unspecified shoulder: Secondary | ICD-10-CM | POA: Diagnosis not present

## 2013-11-29 DIAGNOSIS — M25619 Stiffness of unspecified shoulder, not elsewhere classified: Secondary | ICD-10-CM | POA: Diagnosis not present

## 2013-11-29 DIAGNOSIS — Z5189 Encounter for other specified aftercare: Secondary | ICD-10-CM | POA: Diagnosis not present

## 2013-11-29 DIAGNOSIS — S42309D Unspecified fracture of shaft of humerus, unspecified arm, subsequent encounter for fracture with routine healing: Secondary | ICD-10-CM | POA: Diagnosis not present

## 2013-12-20 DIAGNOSIS — M25619 Stiffness of unspecified shoulder, not elsewhere classified: Secondary | ICD-10-CM | POA: Diagnosis not present

## 2013-12-20 DIAGNOSIS — S42309D Unspecified fracture of shaft of humerus, unspecified arm, subsequent encounter for fracture with routine healing: Secondary | ICD-10-CM | POA: Diagnosis not present

## 2013-12-20 DIAGNOSIS — M25519 Pain in unspecified shoulder: Secondary | ICD-10-CM | POA: Diagnosis not present

## 2013-12-30 DIAGNOSIS — M25519 Pain in unspecified shoulder: Secondary | ICD-10-CM | POA: Diagnosis not present

## 2014-01-02 DIAGNOSIS — M353 Polymyalgia rheumatica: Secondary | ICD-10-CM | POA: Diagnosis not present

## 2014-01-02 DIAGNOSIS — M2550 Pain in unspecified joint: Secondary | ICD-10-CM | POA: Diagnosis not present

## 2014-01-02 DIAGNOSIS — M1990 Unspecified osteoarthritis, unspecified site: Secondary | ICD-10-CM | POA: Diagnosis not present

## 2014-01-02 DIAGNOSIS — M545 Low back pain, unspecified: Secondary | ICD-10-CM | POA: Diagnosis not present

## 2014-01-04 DIAGNOSIS — M25512 Pain in left shoulder: Secondary | ICD-10-CM | POA: Diagnosis not present

## 2014-01-04 DIAGNOSIS — S40012D Contusion of left shoulder, subsequent encounter: Secondary | ICD-10-CM | POA: Diagnosis not present

## 2014-01-04 DIAGNOSIS — Z23 Encounter for immunization: Secondary | ICD-10-CM | POA: Diagnosis not present

## 2014-01-04 DIAGNOSIS — M25612 Stiffness of left shoulder, not elsewhere classified: Secondary | ICD-10-CM | POA: Diagnosis not present

## 2014-01-04 DIAGNOSIS — S42202D Unspecified fracture of upper end of left humerus, subsequent encounter for fracture with routine healing: Secondary | ICD-10-CM | POA: Diagnosis not present

## 2014-01-09 DIAGNOSIS — M25512 Pain in left shoulder: Secondary | ICD-10-CM | POA: Diagnosis not present

## 2014-01-09 DIAGNOSIS — S40012D Contusion of left shoulder, subsequent encounter: Secondary | ICD-10-CM | POA: Diagnosis not present

## 2014-01-09 DIAGNOSIS — M25612 Stiffness of left shoulder, not elsewhere classified: Secondary | ICD-10-CM | POA: Diagnosis not present

## 2014-01-09 DIAGNOSIS — S42202D Unspecified fracture of upper end of left humerus, subsequent encounter for fracture with routine healing: Secondary | ICD-10-CM | POA: Diagnosis not present

## 2014-01-11 DIAGNOSIS — S42202D Unspecified fracture of upper end of left humerus, subsequent encounter for fracture with routine healing: Secondary | ICD-10-CM | POA: Diagnosis not present

## 2014-01-11 DIAGNOSIS — M25612 Stiffness of left shoulder, not elsewhere classified: Secondary | ICD-10-CM | POA: Diagnosis not present

## 2014-01-11 DIAGNOSIS — M25512 Pain in left shoulder: Secondary | ICD-10-CM | POA: Diagnosis not present

## 2014-01-11 DIAGNOSIS — S40012D Contusion of left shoulder, subsequent encounter: Secondary | ICD-10-CM | POA: Diagnosis not present

## 2014-01-16 DIAGNOSIS — S40012D Contusion of left shoulder, subsequent encounter: Secondary | ICD-10-CM | POA: Diagnosis not present

## 2014-01-16 DIAGNOSIS — M25612 Stiffness of left shoulder, not elsewhere classified: Secondary | ICD-10-CM | POA: Diagnosis not present

## 2014-01-16 DIAGNOSIS — S42202D Unspecified fracture of upper end of left humerus, subsequent encounter for fracture with routine healing: Secondary | ICD-10-CM | POA: Diagnosis not present

## 2014-01-16 DIAGNOSIS — M25512 Pain in left shoulder: Secondary | ICD-10-CM | POA: Diagnosis not present

## 2014-01-18 DIAGNOSIS — S40012D Contusion of left shoulder, subsequent encounter: Secondary | ICD-10-CM | POA: Diagnosis not present

## 2014-01-18 DIAGNOSIS — M25612 Stiffness of left shoulder, not elsewhere classified: Secondary | ICD-10-CM | POA: Diagnosis not present

## 2014-01-18 DIAGNOSIS — S42202D Unspecified fracture of upper end of left humerus, subsequent encounter for fracture with routine healing: Secondary | ICD-10-CM | POA: Diagnosis not present

## 2014-01-18 DIAGNOSIS — M25512 Pain in left shoulder: Secondary | ICD-10-CM | POA: Diagnosis not present

## 2014-01-23 DIAGNOSIS — S40012D Contusion of left shoulder, subsequent encounter: Secondary | ICD-10-CM | POA: Diagnosis not present

## 2014-01-23 DIAGNOSIS — M25512 Pain in left shoulder: Secondary | ICD-10-CM | POA: Diagnosis not present

## 2014-01-23 DIAGNOSIS — S42202D Unspecified fracture of upper end of left humerus, subsequent encounter for fracture with routine healing: Secondary | ICD-10-CM | POA: Diagnosis not present

## 2014-01-23 DIAGNOSIS — M25612 Stiffness of left shoulder, not elsewhere classified: Secondary | ICD-10-CM | POA: Diagnosis not present

## 2014-01-25 DIAGNOSIS — S42202D Unspecified fracture of upper end of left humerus, subsequent encounter for fracture with routine healing: Secondary | ICD-10-CM | POA: Diagnosis not present

## 2014-01-25 DIAGNOSIS — M25612 Stiffness of left shoulder, not elsewhere classified: Secondary | ICD-10-CM | POA: Diagnosis not present

## 2014-01-25 DIAGNOSIS — S40012D Contusion of left shoulder, subsequent encounter: Secondary | ICD-10-CM | POA: Diagnosis not present

## 2014-01-25 DIAGNOSIS — M25512 Pain in left shoulder: Secondary | ICD-10-CM | POA: Diagnosis not present

## 2014-01-30 DIAGNOSIS — S40012D Contusion of left shoulder, subsequent encounter: Secondary | ICD-10-CM | POA: Diagnosis not present

## 2014-01-30 DIAGNOSIS — S42202D Unspecified fracture of upper end of left humerus, subsequent encounter for fracture with routine healing: Secondary | ICD-10-CM | POA: Diagnosis not present

## 2014-01-30 DIAGNOSIS — M25612 Stiffness of left shoulder, not elsewhere classified: Secondary | ICD-10-CM | POA: Diagnosis not present

## 2014-01-30 DIAGNOSIS — M25512 Pain in left shoulder: Secondary | ICD-10-CM | POA: Diagnosis not present

## 2014-02-01 DIAGNOSIS — M25512 Pain in left shoulder: Secondary | ICD-10-CM | POA: Diagnosis not present

## 2014-02-01 DIAGNOSIS — M6281 Muscle weakness (generalized): Secondary | ICD-10-CM | POA: Diagnosis not present

## 2014-02-01 DIAGNOSIS — M25612 Stiffness of left shoulder, not elsewhere classified: Secondary | ICD-10-CM | POA: Diagnosis not present

## 2014-02-01 DIAGNOSIS — S42202D Unspecified fracture of upper end of left humerus, subsequent encounter for fracture with routine healing: Secondary | ICD-10-CM | POA: Diagnosis not present

## 2014-02-19 DIAGNOSIS — H40013 Open angle with borderline findings, low risk, bilateral: Secondary | ICD-10-CM | POA: Diagnosis not present

## 2014-02-28 DIAGNOSIS — M25512 Pain in left shoulder: Secondary | ICD-10-CM | POA: Diagnosis not present

## 2014-02-28 DIAGNOSIS — G5622 Lesion of ulnar nerve, left upper limb: Secondary | ICD-10-CM | POA: Diagnosis not present

## 2014-02-28 DIAGNOSIS — M25522 Pain in left elbow: Secondary | ICD-10-CM | POA: Diagnosis not present

## 2014-02-28 DIAGNOSIS — M25612 Stiffness of left shoulder, not elsewhere classified: Secondary | ICD-10-CM | POA: Diagnosis not present

## 2014-03-06 DIAGNOSIS — I739 Peripheral vascular disease, unspecified: Secondary | ICD-10-CM | POA: Diagnosis not present

## 2014-03-06 DIAGNOSIS — M353 Polymyalgia rheumatica: Secondary | ICD-10-CM | POA: Diagnosis not present

## 2014-03-06 DIAGNOSIS — M2550 Pain in unspecified joint: Secondary | ICD-10-CM | POA: Diagnosis not present

## 2014-03-06 DIAGNOSIS — M150 Primary generalized (osteo)arthritis: Secondary | ICD-10-CM | POA: Diagnosis not present

## 2014-03-20 DIAGNOSIS — E780 Pure hypercholesterolemia: Secondary | ICD-10-CM | POA: Diagnosis not present

## 2014-03-20 DIAGNOSIS — R600 Localized edema: Secondary | ICD-10-CM | POA: Diagnosis not present

## 2014-03-20 DIAGNOSIS — M353 Polymyalgia rheumatica: Secondary | ICD-10-CM | POA: Diagnosis not present

## 2014-03-20 DIAGNOSIS — Z23 Encounter for immunization: Secondary | ICD-10-CM | POA: Diagnosis not present

## 2014-03-27 DIAGNOSIS — M25612 Stiffness of left shoulder, not elsewhere classified: Secondary | ICD-10-CM | POA: Diagnosis not present

## 2014-03-27 DIAGNOSIS — M75102 Unspecified rotator cuff tear or rupture of left shoulder, not specified as traumatic: Secondary | ICD-10-CM | POA: Diagnosis not present

## 2014-03-27 DIAGNOSIS — G5622 Lesion of ulnar nerve, left upper limb: Secondary | ICD-10-CM | POA: Diagnosis not present

## 2014-03-27 DIAGNOSIS — M25512 Pain in left shoulder: Secondary | ICD-10-CM | POA: Diagnosis not present

## 2014-03-27 DIAGNOSIS — M25522 Pain in left elbow: Secondary | ICD-10-CM | POA: Diagnosis not present

## 2014-04-07 DIAGNOSIS — M25512 Pain in left shoulder: Secondary | ICD-10-CM | POA: Diagnosis not present

## 2014-04-16 DIAGNOSIS — G5622 Lesion of ulnar nerve, left upper limb: Secondary | ICD-10-CM | POA: Diagnosis not present

## 2014-04-16 DIAGNOSIS — M25522 Pain in left elbow: Secondary | ICD-10-CM | POA: Diagnosis not present

## 2014-04-16 DIAGNOSIS — M25512 Pain in left shoulder: Secondary | ICD-10-CM | POA: Diagnosis not present

## 2014-05-04 DIAGNOSIS — R238 Other skin changes: Secondary | ICD-10-CM | POA: Diagnosis not present

## 2014-05-04 DIAGNOSIS — K219 Gastro-esophageal reflux disease without esophagitis: Secondary | ICD-10-CM | POA: Diagnosis not present

## 2014-05-04 DIAGNOSIS — R233 Spontaneous ecchymoses: Secondary | ICD-10-CM | POA: Diagnosis not present

## 2014-05-04 DIAGNOSIS — K5732 Diverticulitis of large intestine without perforation or abscess without bleeding: Secondary | ICD-10-CM | POA: Diagnosis not present

## 2014-05-04 DIAGNOSIS — K580 Irritable bowel syndrome with diarrhea: Secondary | ICD-10-CM | POA: Diagnosis not present

## 2014-05-04 DIAGNOSIS — R062 Wheezing: Secondary | ICD-10-CM | POA: Diagnosis not present

## 2014-05-04 DIAGNOSIS — R0602 Shortness of breath: Secondary | ICD-10-CM | POA: Diagnosis not present

## 2014-05-04 DIAGNOSIS — R5383 Other fatigue: Secondary | ICD-10-CM | POA: Diagnosis not present

## 2014-05-04 DIAGNOSIS — E048 Other specified nontoxic goiter: Secondary | ICD-10-CM | POA: Diagnosis not present

## 2014-05-04 DIAGNOSIS — M353 Polymyalgia rheumatica: Secondary | ICD-10-CM | POA: Diagnosis not present

## 2014-05-04 DIAGNOSIS — R05 Cough: Secondary | ICD-10-CM | POA: Diagnosis not present

## 2014-05-07 DIAGNOSIS — M353 Polymyalgia rheumatica: Secondary | ICD-10-CM | POA: Diagnosis not present

## 2014-05-07 DIAGNOSIS — I739 Peripheral vascular disease, unspecified: Secondary | ICD-10-CM | POA: Diagnosis not present

## 2014-05-07 DIAGNOSIS — M150 Primary generalized (osteo)arthritis: Secondary | ICD-10-CM | POA: Diagnosis not present

## 2014-05-07 DIAGNOSIS — M2550 Pain in unspecified joint: Secondary | ICD-10-CM | POA: Diagnosis not present

## 2014-05-10 DIAGNOSIS — E042 Nontoxic multinodular goiter: Secondary | ICD-10-CM | POA: Diagnosis not present

## 2014-05-10 DIAGNOSIS — E048 Other specified nontoxic goiter: Secondary | ICD-10-CM | POA: Diagnosis not present

## 2014-05-25 DIAGNOSIS — K5732 Diverticulitis of large intestine without perforation or abscess without bleeding: Secondary | ICD-10-CM | POA: Diagnosis not present

## 2014-05-25 DIAGNOSIS — E876 Hypokalemia: Secondary | ICD-10-CM | POA: Diagnosis not present

## 2014-05-25 DIAGNOSIS — R5383 Other fatigue: Secondary | ICD-10-CM | POA: Diagnosis not present

## 2014-05-25 DIAGNOSIS — E048 Other specified nontoxic goiter: Secondary | ICD-10-CM | POA: Diagnosis not present

## 2014-05-25 DIAGNOSIS — M353 Polymyalgia rheumatica: Secondary | ICD-10-CM | POA: Diagnosis not present

## 2014-05-28 DIAGNOSIS — G5622 Lesion of ulnar nerve, left upper limb: Secondary | ICD-10-CM | POA: Diagnosis not present

## 2014-06-25 DIAGNOSIS — E780 Pure hypercholesterolemia: Secondary | ICD-10-CM | POA: Diagnosis not present

## 2014-06-25 DIAGNOSIS — E782 Mixed hyperlipidemia: Secondary | ICD-10-CM | POA: Diagnosis not present

## 2014-06-25 DIAGNOSIS — R5383 Other fatigue: Secondary | ICD-10-CM | POA: Diagnosis not present

## 2014-06-25 DIAGNOSIS — F418 Other specified anxiety disorders: Secondary | ICD-10-CM | POA: Diagnosis not present

## 2014-06-25 DIAGNOSIS — M353 Polymyalgia rheumatica: Secondary | ICD-10-CM | POA: Diagnosis not present

## 2014-07-09 DIAGNOSIS — Z79899 Other long term (current) drug therapy: Secondary | ICD-10-CM | POA: Diagnosis not present

## 2014-07-09 DIAGNOSIS — M150 Primary generalized (osteo)arthritis: Secondary | ICD-10-CM | POA: Diagnosis not present

## 2014-07-09 DIAGNOSIS — I739 Peripheral vascular disease, unspecified: Secondary | ICD-10-CM | POA: Diagnosis not present

## 2014-07-09 DIAGNOSIS — M353 Polymyalgia rheumatica: Secondary | ICD-10-CM | POA: Diagnosis not present

## 2014-07-09 DIAGNOSIS — M2550 Pain in unspecified joint: Secondary | ICD-10-CM | POA: Diagnosis not present

## 2014-07-10 DIAGNOSIS — L570 Actinic keratosis: Secondary | ICD-10-CM | POA: Diagnosis not present

## 2014-07-10 DIAGNOSIS — L821 Other seborrheic keratosis: Secondary | ICD-10-CM | POA: Diagnosis not present

## 2014-07-10 DIAGNOSIS — L853 Xerosis cutis: Secondary | ICD-10-CM | POA: Diagnosis not present

## 2014-07-25 DIAGNOSIS — M5442 Lumbago with sciatica, left side: Secondary | ICD-10-CM | POA: Diagnosis not present

## 2014-07-25 DIAGNOSIS — M4310 Spondylolisthesis, site unspecified: Secondary | ICD-10-CM | POA: Diagnosis not present

## 2014-07-25 DIAGNOSIS — I70209 Unspecified atherosclerosis of native arteries of extremities, unspecified extremity: Secondary | ICD-10-CM | POA: Diagnosis not present

## 2014-07-25 DIAGNOSIS — M5441 Lumbago with sciatica, right side: Secondary | ICD-10-CM | POA: Diagnosis not present

## 2014-07-26 DIAGNOSIS — R5383 Other fatigue: Secondary | ICD-10-CM | POA: Diagnosis not present

## 2014-07-26 DIAGNOSIS — F418 Other specified anxiety disorders: Secondary | ICD-10-CM | POA: Diagnosis not present

## 2014-08-03 DIAGNOSIS — M4317 Spondylolisthesis, lumbosacral region: Secondary | ICD-10-CM | POA: Diagnosis not present

## 2014-08-03 DIAGNOSIS — M47816 Spondylosis without myelopathy or radiculopathy, lumbar region: Secondary | ICD-10-CM | POA: Diagnosis not present

## 2014-08-03 DIAGNOSIS — M4316 Spondylolisthesis, lumbar region: Secondary | ICD-10-CM | POA: Diagnosis not present

## 2014-08-03 DIAGNOSIS — M545 Low back pain: Secondary | ICD-10-CM | POA: Diagnosis not present

## 2014-08-03 DIAGNOSIS — M5442 Lumbago with sciatica, left side: Secondary | ICD-10-CM | POA: Diagnosis not present

## 2014-08-17 DIAGNOSIS — M5441 Lumbago with sciatica, right side: Secondary | ICD-10-CM | POA: Diagnosis not present

## 2014-08-17 DIAGNOSIS — I70209 Unspecified atherosclerosis of native arteries of extremities, unspecified extremity: Secondary | ICD-10-CM | POA: Diagnosis not present

## 2014-08-17 DIAGNOSIS — M5442 Lumbago with sciatica, left side: Secondary | ICD-10-CM | POA: Diagnosis not present

## 2014-08-17 DIAGNOSIS — M4310 Spondylolisthesis, site unspecified: Secondary | ICD-10-CM | POA: Diagnosis not present

## 2014-08-27 DIAGNOSIS — H539 Unspecified visual disturbance: Secondary | ICD-10-CM | POA: Diagnosis not present

## 2014-08-27 DIAGNOSIS — H40013 Open angle with borderline findings, low risk, bilateral: Secondary | ICD-10-CM | POA: Diagnosis not present

## 2014-09-11 DIAGNOSIS — M545 Low back pain: Secondary | ICD-10-CM | POA: Diagnosis not present

## 2014-09-11 DIAGNOSIS — M47817 Spondylosis without myelopathy or radiculopathy, lumbosacral region: Secondary | ICD-10-CM | POA: Diagnosis not present

## 2014-09-26 DIAGNOSIS — H40013 Open angle with borderline findings, low risk, bilateral: Secondary | ICD-10-CM | POA: Diagnosis not present

## 2014-09-26 DIAGNOSIS — H2513 Age-related nuclear cataract, bilateral: Secondary | ICD-10-CM | POA: Diagnosis not present

## 2014-10-04 DIAGNOSIS — Q278 Other specified congenital malformations of peripheral vascular system: Secondary | ICD-10-CM | POA: Diagnosis not present

## 2014-10-04 DIAGNOSIS — I10 Essential (primary) hypertension: Secondary | ICD-10-CM | POA: Diagnosis not present

## 2014-10-04 DIAGNOSIS — I471 Supraventricular tachycardia: Secondary | ICD-10-CM | POA: Diagnosis not present

## 2014-10-04 DIAGNOSIS — I70219 Atherosclerosis of native arteries of extremities with intermittent claudication, unspecified extremity: Secondary | ICD-10-CM | POA: Diagnosis not present

## 2014-10-16 DIAGNOSIS — I471 Supraventricular tachycardia: Secondary | ICD-10-CM | POA: Diagnosis not present

## 2014-10-16 DIAGNOSIS — R079 Chest pain, unspecified: Secondary | ICD-10-CM | POA: Diagnosis not present

## 2014-10-22 DIAGNOSIS — R55 Syncope and collapse: Secondary | ICD-10-CM | POA: Diagnosis not present

## 2014-10-22 DIAGNOSIS — I471 Supraventricular tachycardia: Secondary | ICD-10-CM | POA: Diagnosis not present

## 2014-10-25 DIAGNOSIS — E780 Pure hypercholesterolemia: Secondary | ICD-10-CM | POA: Diagnosis not present

## 2014-10-25 DIAGNOSIS — M545 Low back pain: Secondary | ICD-10-CM | POA: Diagnosis not present

## 2014-10-25 DIAGNOSIS — K219 Gastro-esophageal reflux disease without esophagitis: Secondary | ICD-10-CM | POA: Diagnosis not present

## 2014-10-25 DIAGNOSIS — M353 Polymyalgia rheumatica: Secondary | ICD-10-CM | POA: Diagnosis not present

## 2014-10-25 DIAGNOSIS — R05 Cough: Secondary | ICD-10-CM | POA: Diagnosis not present

## 2014-10-26 DIAGNOSIS — R05 Cough: Secondary | ICD-10-CM | POA: Diagnosis not present

## 2014-10-26 DIAGNOSIS — J449 Chronic obstructive pulmonary disease, unspecified: Secondary | ICD-10-CM | POA: Diagnosis not present

## 2014-10-31 DIAGNOSIS — M353 Polymyalgia rheumatica: Secondary | ICD-10-CM | POA: Diagnosis not present

## 2014-10-31 DIAGNOSIS — R7301 Impaired fasting glucose: Secondary | ICD-10-CM | POA: Diagnosis not present

## 2014-10-31 DIAGNOSIS — E782 Mixed hyperlipidemia: Secondary | ICD-10-CM | POA: Diagnosis not present

## 2014-11-08 DIAGNOSIS — F17210 Nicotine dependence, cigarettes, uncomplicated: Secondary | ICD-10-CM | POA: Diagnosis not present

## 2014-11-08 DIAGNOSIS — J449 Chronic obstructive pulmonary disease, unspecified: Secondary | ICD-10-CM | POA: Diagnosis not present

## 2014-11-08 DIAGNOSIS — R7301 Impaired fasting glucose: Secondary | ICD-10-CM | POA: Diagnosis not present

## 2014-11-20 DIAGNOSIS — H25812 Combined forms of age-related cataract, left eye: Secondary | ICD-10-CM | POA: Diagnosis not present

## 2014-11-21 DIAGNOSIS — H2512 Age-related nuclear cataract, left eye: Secondary | ICD-10-CM | POA: Diagnosis not present

## 2014-11-21 DIAGNOSIS — H25812 Combined forms of age-related cataract, left eye: Secondary | ICD-10-CM | POA: Diagnosis not present

## 2014-12-13 DIAGNOSIS — H2511 Age-related nuclear cataract, right eye: Secondary | ICD-10-CM | POA: Diagnosis not present

## 2014-12-13 DIAGNOSIS — H25811 Combined forms of age-related cataract, right eye: Secondary | ICD-10-CM | POA: Diagnosis not present

## 2014-12-25 DIAGNOSIS — F418 Other specified anxiety disorders: Secondary | ICD-10-CM | POA: Diagnosis not present

## 2014-12-25 DIAGNOSIS — Z23 Encounter for immunization: Secondary | ICD-10-CM | POA: Diagnosis not present

## 2014-12-25 DIAGNOSIS — W19XXXA Unspecified fall, initial encounter: Secondary | ICD-10-CM | POA: Diagnosis not present

## 2014-12-25 DIAGNOSIS — H9313 Tinnitus, bilateral: Secondary | ICD-10-CM | POA: Diagnosis not present

## 2014-12-25 DIAGNOSIS — R42 Dizziness and giddiness: Secondary | ICD-10-CM | POA: Diagnosis not present

## 2014-12-25 DIAGNOSIS — J449 Chronic obstructive pulmonary disease, unspecified: Secondary | ICD-10-CM | POA: Diagnosis not present

## 2014-12-25 DIAGNOSIS — F17210 Nicotine dependence, cigarettes, uncomplicated: Secondary | ICD-10-CM | POA: Diagnosis not present

## 2014-12-25 DIAGNOSIS — R2681 Unsteadiness on feet: Secondary | ICD-10-CM | POA: Diagnosis not present

## 2015-01-01 DIAGNOSIS — R2681 Unsteadiness on feet: Secondary | ICD-10-CM | POA: Diagnosis not present

## 2015-01-01 DIAGNOSIS — R42 Dizziness and giddiness: Secondary | ICD-10-CM | POA: Diagnosis not present

## 2015-01-10 DIAGNOSIS — H524 Presbyopia: Secondary | ICD-10-CM | POA: Diagnosis not present

## 2015-01-16 ENCOUNTER — Encounter: Payer: Self-pay | Admitting: Neurology

## 2015-01-16 ENCOUNTER — Ambulatory Visit (INDEPENDENT_AMBULATORY_CARE_PROVIDER_SITE_OTHER): Payer: Medicare Other | Admitting: Neurology

## 2015-01-16 VITALS — BP 130/68 | HR 60 | Ht 64.0 in | Wt 161.9 lb

## 2015-01-16 DIAGNOSIS — R296 Repeated falls: Secondary | ICD-10-CM

## 2015-01-16 DIAGNOSIS — G4486 Cervicogenic headache: Secondary | ICD-10-CM

## 2015-01-16 DIAGNOSIS — F17200 Nicotine dependence, unspecified, uncomplicated: Secondary | ICD-10-CM | POA: Insufficient documentation

## 2015-01-16 DIAGNOSIS — I10 Essential (primary) hypertension: Secondary | ICD-10-CM

## 2015-01-16 DIAGNOSIS — M5481 Occipital neuralgia: Secondary | ICD-10-CM

## 2015-01-16 DIAGNOSIS — R2681 Unsteadiness on feet: Secondary | ICD-10-CM

## 2015-01-16 DIAGNOSIS — E785 Hyperlipidemia, unspecified: Secondary | ICD-10-CM | POA: Diagnosis not present

## 2015-01-16 DIAGNOSIS — R292 Abnormal reflex: Secondary | ICD-10-CM

## 2015-01-16 DIAGNOSIS — I679 Cerebrovascular disease, unspecified: Secondary | ICD-10-CM | POA: Insufficient documentation

## 2015-01-16 DIAGNOSIS — I110 Hypertensive heart disease with heart failure: Secondary | ICD-10-CM | POA: Insufficient documentation

## 2015-01-16 DIAGNOSIS — Z720 Tobacco use: Secondary | ICD-10-CM | POA: Insufficient documentation

## 2015-01-16 DIAGNOSIS — R51 Headache: Secondary | ICD-10-CM

## 2015-01-16 MED ORDER — GABAPENTIN 100 MG PO CAPS: ORAL_CAPSULE | ORAL | Status: DC
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
## 2015-01-16 NOTE — Patient Instructions (Addendum)
Continue plavix Will check MRI of cervical spine to look for pinching of spinal cord that may be causing balance problems    01/25/2015 at [place] Will check carotid doppler       01/25/2015 at 1pm at [place] disc of MRI and bring to me to look at. For headache, start gabapentin 100mg  twice daily for 7 days, then 200mg  twice daily for 7 days, then 300mg  twice daily.  If headaches persist, call us and we can bring you in for steroid injection in the back of head Quit smoking Mediterranean diet Follow up in 3 months.

## 2015-01-25 ENCOUNTER — Ambulatory Visit (HOSPITAL_BASED_OUTPATIENT_CLINIC_OR_DEPARTMENT_OTHER)
Admission: RE | Admit: 2015-01-25 | Discharge: 2015-01-25 | Disposition: A | Discharge status: Home / Self Care | Payer: Medicare Other | Source: Ambulatory Visit | Attending: Neurology | Admitting: Neurology

## 2015-01-25 ENCOUNTER — Ambulatory Visit (HOSPITAL_COMMUNITY)
Admission: RE | Admit: 2015-01-25 | Discharge: 2015-01-25 | Disposition: A | Discharge status: Home / Self Care | Payer: Medicare Other | Source: Ambulatory Visit | Attending: Neurology | Admitting: Neurology

## 2015-01-25 ENCOUNTER — Encounter (HOSPITAL_COMMUNITY): Payer: Self-pay | Admitting: *Deleted

## 2015-01-25 DIAGNOSIS — Z720 Tobacco use: Secondary | ICD-10-CM

## 2015-01-25 DIAGNOSIS — M5481 Occipital neuralgia: Secondary | ICD-10-CM | POA: Diagnosis not present

## 2015-01-25 DIAGNOSIS — E785 Hyperlipidemia, unspecified: Secondary | ICD-10-CM

## 2015-01-25 DIAGNOSIS — G4486 Cervicogenic headache: Secondary | ICD-10-CM

## 2015-01-25 DIAGNOSIS — M4802 Spinal stenosis, cervical region: Secondary | ICD-10-CM | POA: Diagnosis not present

## 2015-01-25 DIAGNOSIS — I679 Cerebrovascular disease, unspecified: Secondary | ICD-10-CM

## 2015-01-25 DIAGNOSIS — R51 Headache: Secondary | ICD-10-CM | POA: Insufficient documentation

## 2015-01-25 DIAGNOSIS — R296 Repeated falls: Secondary | ICD-10-CM | POA: Insufficient documentation

## 2015-01-25 DIAGNOSIS — I10 Essential (primary) hypertension: Secondary | ICD-10-CM

## 2015-01-25 DIAGNOSIS — R292 Abnormal reflex: Secondary | ICD-10-CM | POA: Diagnosis not present

## 2015-01-25 DIAGNOSIS — M542 Cervicalgia: Secondary | ICD-10-CM | POA: Diagnosis not present

## 2015-01-25 DIAGNOSIS — R2681 Unsteadiness on feet: Secondary | ICD-10-CM

## 2015-01-25 DIAGNOSIS — S199XXA Unspecified injury of neck, initial encounter: Secondary | ICD-10-CM | POA: Diagnosis not present

## 2015-01-25 DIAGNOSIS — M50322 Other cervical disc degeneration at C5-C6 level: Secondary | ICD-10-CM | POA: Diagnosis not present

## 2015-01-25 NOTE — Progress Notes (Signed)
*  PRELIMINARY RESULTS* Vascular Ultrasound Carotid Duplex (Doppler) has been completed.  Preliminary findings: Right = 1-39% ICA stenosis. Left = 40-59% ICA stenosis, based on systolic velocity and ratio; 1-39% upper end of scale, based on diastolic velocity. Atypical right vertebral. Per pt, history of right arm stenting. Antegrade left vertebral.   Landry Mellow, RDMS, RVT  01/25/2015, 2:08 PM

## 2015-01-28 ENCOUNTER — Telehealth: Payer: Self-pay

## 2015-01-28 NOTE — Telephone Encounter (Signed)
-----   Message from Pieter Partridge, DO sent at 01/28/2015  3:30 PM EDT ----- Carotid doppler shows some narrowing of the left internal carotid artery.  Therefore, I we will need to monitor and repeat this study in one year.  MRI of the cervical spine does not reveal anything significant that would cause headache.

## 2015-01-28 NOTE — Telephone Encounter (Signed)
Pt notified of results

## 2015-02-06 DIAGNOSIS — R42 Dizziness and giddiness: Secondary | ICD-10-CM | POA: Diagnosis not present

## 2015-02-06 DIAGNOSIS — R2681 Unsteadiness on feet: Secondary | ICD-10-CM | POA: Diagnosis not present

## 2015-02-06 DIAGNOSIS — M353 Polymyalgia rheumatica: Secondary | ICD-10-CM | POA: Diagnosis not present

## 2015-02-06 DIAGNOSIS — E784 Other hyperlipidemia: Secondary | ICD-10-CM | POA: Diagnosis not present

## 2015-02-06 DIAGNOSIS — R600 Localized edema: Secondary | ICD-10-CM | POA: Diagnosis not present

## 2015-02-06 DIAGNOSIS — K219 Gastro-esophageal reflux disease without esophagitis: Secondary | ICD-10-CM | POA: Diagnosis not present

## 2015-02-06 DIAGNOSIS — F17210 Nicotine dependence, cigarettes, uncomplicated: Secondary | ICD-10-CM | POA: Diagnosis not present

## 2015-02-06 DIAGNOSIS — I10 Essential (primary) hypertension: Secondary | ICD-10-CM | POA: Diagnosis not present

## 2015-02-06 DIAGNOSIS — E782 Mixed hyperlipidemia: Secondary | ICD-10-CM | POA: Diagnosis not present

## 2015-02-06 DIAGNOSIS — R7301 Impaired fasting glucose: Secondary | ICD-10-CM | POA: Diagnosis not present

## 2015-04-02 ENCOUNTER — Telehealth: Payer: Self-pay | Admitting: Neurology

## 2015-04-02 NOTE — Telephone Encounter (Signed)
Pt has some questions about the Gabapentin she needs to know if he still wants her to keep taking it if so she will need a refill please call patient on [phone]

## 2015-04-04 MED ORDER — GABAPENTIN 100 MG PO CAPS: 300.0000 mg | ORAL_CAPSULE | Freq: Two times a day (BID) | ORAL | Status: DC

## 2015-04-04 NOTE — Telephone Encounter (Signed)
[facility] patient.  

## 2015-04-07 HISTORY — PX: OTHER SURGICAL HISTORY: SHX169

## 2015-04-24 DIAGNOSIS — D1803 Hemangioma of intra-abdominal structures: Secondary | ICD-10-CM | POA: Diagnosis not present

## 2015-04-24 DIAGNOSIS — R0902 Hypoxemia: Secondary | ICD-10-CM | POA: Diagnosis not present

## 2015-04-24 DIAGNOSIS — M62830 Muscle spasm of back: Secondary | ICD-10-CM | POA: Diagnosis not present

## 2015-04-24 DIAGNOSIS — K6389 Other specified diseases of intestine: Secondary | ICD-10-CM | POA: Diagnosis not present

## 2015-04-24 DIAGNOSIS — Z8673 Personal history of transient ischemic attack (TIA), and cerebral infarction without residual deficits: Secondary | ICD-10-CM | POA: Diagnosis not present

## 2015-04-24 DIAGNOSIS — Z7902 Long term (current) use of antithrombotics/antiplatelets: Secondary | ICD-10-CM | POA: Diagnosis not present

## 2015-04-24 DIAGNOSIS — J9811 Atelectasis: Secondary | ICD-10-CM | POA: Diagnosis not present

## 2015-04-24 DIAGNOSIS — K5900 Constipation, unspecified: Secondary | ICD-10-CM | POA: Diagnosis not present

## 2015-04-24 DIAGNOSIS — J432 Centrilobular emphysema: Secondary | ICD-10-CM | POA: Diagnosis not present

## 2015-04-24 DIAGNOSIS — M4806 Spinal stenosis, lumbar region: Secondary | ICD-10-CM | POA: Diagnosis not present

## 2015-04-24 DIAGNOSIS — D649 Anemia, unspecified: Secondary | ICD-10-CM | POA: Diagnosis present

## 2015-04-24 DIAGNOSIS — Z7952 Long term (current) use of systemic steroids: Secondary | ICD-10-CM | POA: Diagnosis not present

## 2015-04-24 DIAGNOSIS — R079 Chest pain, unspecified: Secondary | ICD-10-CM | POA: Diagnosis not present

## 2015-04-24 DIAGNOSIS — S22008A Other fracture of unspecified thoracic vertebra, initial encounter for closed fracture: Secondary | ICD-10-CM | POA: Diagnosis not present

## 2015-04-24 DIAGNOSIS — S22089A Unspecified fracture of T11-T12 vertebra, initial encounter for closed fracture: Secondary | ICD-10-CM | POA: Diagnosis not present

## 2015-04-24 DIAGNOSIS — M47812 Spondylosis without myelopathy or radiculopathy, cervical region: Secondary | ICD-10-CM | POA: Diagnosis not present

## 2015-04-24 DIAGNOSIS — K219 Gastro-esophageal reflux disease without esophagitis: Secondary | ICD-10-CM | POA: Diagnosis present

## 2015-04-24 DIAGNOSIS — M5136 Other intervertebral disc degeneration, lumbar region: Secondary | ICD-10-CM | POA: Diagnosis not present

## 2015-04-24 DIAGNOSIS — R0989 Other specified symptoms and signs involving the circulatory and respiratory systems: Secondary | ICD-10-CM | POA: Diagnosis not present

## 2015-04-24 DIAGNOSIS — J439 Emphysema, unspecified: Secondary | ICD-10-CM | POA: Diagnosis not present

## 2015-04-24 DIAGNOSIS — M069 Rheumatoid arthritis, unspecified: Secondary | ICD-10-CM | POA: Diagnosis present

## 2015-04-24 DIAGNOSIS — D721 Eosinophilia: Secondary | ICD-10-CM | POA: Diagnosis not present

## 2015-04-24 DIAGNOSIS — I10 Essential (primary) hypertension: Secondary | ICD-10-CM | POA: Diagnosis present

## 2015-04-24 DIAGNOSIS — I5033 Acute on chronic diastolic (congestive) heart failure: Secondary | ICD-10-CM | POA: Diagnosis not present

## 2015-04-24 DIAGNOSIS — R51 Headache: Secondary | ICD-10-CM | POA: Diagnosis not present

## 2015-04-24 DIAGNOSIS — M47814 Spondylosis without myelopathy or radiculopathy, thoracic region: Secondary | ICD-10-CM | POA: Diagnosis not present

## 2015-04-24 DIAGNOSIS — F17210 Nicotine dependence, cigarettes, uncomplicated: Secondary | ICD-10-CM | POA: Diagnosis present

## 2015-04-24 DIAGNOSIS — I700 Atherosclerosis of aorta: Secondary | ICD-10-CM | POA: Diagnosis not present

## 2015-04-24 DIAGNOSIS — K449 Diaphragmatic hernia without obstruction or gangrene: Secondary | ICD-10-CM | POA: Diagnosis not present

## 2015-04-24 DIAGNOSIS — M4854XA Collapsed vertebra, not elsewhere classified, thoracic region, initial encounter for fracture: Secondary | ICD-10-CM | POA: Diagnosis not present

## 2015-04-24 DIAGNOSIS — E871 Hypo-osmolality and hyponatremia: Secondary | ICD-10-CM | POA: Diagnosis not present

## 2015-04-24 DIAGNOSIS — Z981 Arthrodesis status: Secondary | ICD-10-CM | POA: Diagnosis not present

## 2015-04-24 DIAGNOSIS — R0602 Shortness of breath: Secondary | ICD-10-CM | POA: Diagnosis not present

## 2015-04-24 DIAGNOSIS — K567 Ileus, unspecified: Secondary | ICD-10-CM | POA: Diagnosis not present

## 2015-04-24 DIAGNOSIS — J209 Acute bronchitis, unspecified: Secondary | ICD-10-CM | POA: Diagnosis not present

## 2015-04-24 DIAGNOSIS — R0600 Dyspnea, unspecified: Secondary | ICD-10-CM | POA: Diagnosis not present

## 2015-04-24 DIAGNOSIS — R918 Other nonspecific abnormal finding of lung field: Secondary | ICD-10-CM | POA: Diagnosis not present

## 2015-04-24 DIAGNOSIS — M549 Dorsalgia, unspecified: Secondary | ICD-10-CM | POA: Diagnosis not present

## 2015-04-24 DIAGNOSIS — J90 Pleural effusion, not elsewhere classified: Secondary | ICD-10-CM | POA: Diagnosis not present

## 2015-04-24 DIAGNOSIS — J9601 Acute respiratory failure with hypoxia: Secondary | ICD-10-CM | POA: Diagnosis not present

## 2015-04-24 DIAGNOSIS — M419 Scoliosis, unspecified: Secondary | ICD-10-CM | POA: Diagnosis not present

## 2015-04-24 DIAGNOSIS — S22082A Unstable burst fracture of T11-T12 vertebra, initial encounter for closed fracture: Secondary | ICD-10-CM | POA: Diagnosis not present

## 2015-04-24 DIAGNOSIS — J440 Chronic obstructive pulmonary disease with acute lower respiratory infection: Secondary | ICD-10-CM | POA: Diagnosis present

## 2015-04-24 DIAGNOSIS — R000 Tachycardia, unspecified: Secondary | ICD-10-CM | POA: Diagnosis not present

## 2015-04-24 DIAGNOSIS — M542 Cervicalgia: Secondary | ICD-10-CM | POA: Diagnosis not present

## 2015-04-30 ENCOUNTER — Ambulatory Visit: Payer: Medicare Other | Admitting: Neurology

## 2015-05-06 DIAGNOSIS — Z5189 Encounter for other specified aftercare: Secondary | ICD-10-CM | POA: Diagnosis not present

## 2015-05-06 DIAGNOSIS — Z8673 Personal history of transient ischemic attack (TIA), and cerebral infarction without residual deficits: Secondary | ICD-10-CM | POA: Diagnosis not present

## 2015-05-06 DIAGNOSIS — F17210 Nicotine dependence, cigarettes, uncomplicated: Secondary | ICD-10-CM | POA: Diagnosis present

## 2015-05-06 DIAGNOSIS — K219 Gastro-esophageal reflux disease without esophagitis: Secondary | ICD-10-CM | POA: Diagnosis present

## 2015-05-06 DIAGNOSIS — J441 Chronic obstructive pulmonary disease with (acute) exacerbation: Secondary | ICD-10-CM | POA: Diagnosis not present

## 2015-05-06 DIAGNOSIS — S22082A Unstable burst fracture of T11-T12 vertebra, initial encounter for closed fracture: Secondary | ICD-10-CM | POA: Diagnosis not present

## 2015-05-06 DIAGNOSIS — R079 Chest pain, unspecified: Secondary | ICD-10-CM | POA: Diagnosis not present

## 2015-05-06 DIAGNOSIS — I509 Heart failure, unspecified: Secondary | ICD-10-CM | POA: Diagnosis present

## 2015-05-06 DIAGNOSIS — Z955 Presence of coronary angioplasty implant and graft: Secondary | ICD-10-CM | POA: Diagnosis not present

## 2015-05-06 DIAGNOSIS — I2510 Atherosclerotic heart disease of native coronary artery without angina pectoris: Secondary | ICD-10-CM | POA: Diagnosis present

## 2015-05-06 DIAGNOSIS — J9601 Acute respiratory failure with hypoxia: Secondary | ICD-10-CM | POA: Diagnosis not present

## 2015-05-06 DIAGNOSIS — Z7902 Long term (current) use of antithrombotics/antiplatelets: Secondary | ICD-10-CM | POA: Diagnosis not present

## 2015-05-06 DIAGNOSIS — M7989 Other specified soft tissue disorders: Secondary | ICD-10-CM | POA: Diagnosis not present

## 2015-05-06 DIAGNOSIS — R2689 Other abnormalities of gait and mobility: Secondary | ICD-10-CM | POA: Diagnosis not present

## 2015-05-06 DIAGNOSIS — I959 Hypotension, unspecified: Secondary | ICD-10-CM | POA: Diagnosis present

## 2015-05-06 DIAGNOSIS — M069 Rheumatoid arthritis, unspecified: Secondary | ICD-10-CM | POA: Diagnosis present

## 2015-05-06 DIAGNOSIS — S22081D Stable burst fracture of T11-T12 vertebra, subsequent encounter for fracture with routine healing: Secondary | ICD-10-CM | POA: Diagnosis not present

## 2015-05-06 DIAGNOSIS — S22002A Unstable burst fracture of unspecified thoracic vertebra, initial encounter for closed fracture: Secondary | ICD-10-CM | POA: Diagnosis not present

## 2015-05-06 DIAGNOSIS — I10 Essential (primary) hypertension: Secondary | ICD-10-CM | POA: Diagnosis present

## 2015-05-06 DIAGNOSIS — R0902 Hypoxemia: Secondary | ICD-10-CM | POA: Diagnosis not present

## 2015-05-06 DIAGNOSIS — F39 Unspecified mood [affective] disorder: Secondary | ICD-10-CM | POA: Diagnosis present

## 2015-05-06 DIAGNOSIS — Z7952 Long term (current) use of systemic steroids: Secondary | ICD-10-CM | POA: Diagnosis not present

## 2015-05-23 DIAGNOSIS — Z09 Encounter for follow-up examination after completed treatment for conditions other than malignant neoplasm: Secondary | ICD-10-CM | POA: Diagnosis not present

## 2015-05-23 DIAGNOSIS — M4854XD Collapsed vertebra, not elsewhere classified, thoracic region, subsequent encounter for fracture with routine healing: Secondary | ICD-10-CM | POA: Diagnosis not present

## 2015-05-23 DIAGNOSIS — M419 Scoliosis, unspecified: Secondary | ICD-10-CM | POA: Diagnosis not present

## 2015-05-23 DIAGNOSIS — M4317 Spondylolisthesis, lumbosacral region: Secondary | ICD-10-CM | POA: Diagnosis not present

## 2015-05-24 DIAGNOSIS — S22072A Unstable burst fracture of T9-T10 vertebra, initial encounter for closed fracture: Secondary | ICD-10-CM | POA: Diagnosis not present

## 2015-05-24 DIAGNOSIS — I5022 Chronic systolic (congestive) heart failure: Secondary | ICD-10-CM | POA: Diagnosis not present

## 2015-05-24 DIAGNOSIS — J410 Simple chronic bronchitis: Secondary | ICD-10-CM | POA: Diagnosis not present

## 2015-05-24 DIAGNOSIS — I10 Essential (primary) hypertension: Secondary | ICD-10-CM | POA: Diagnosis not present

## 2015-05-24 DIAGNOSIS — S32011A Stable burst fracture of first lumbar vertebra, initial encounter for closed fracture: Secondary | ICD-10-CM | POA: Diagnosis not present

## 2015-05-27 DIAGNOSIS — H26492 Other secondary cataract, left eye: Secondary | ICD-10-CM | POA: Diagnosis not present

## 2015-09-10 DIAGNOSIS — R7301 Impaired fasting glucose: Secondary | ICD-10-CM | POA: Diagnosis not present

## 2015-09-10 DIAGNOSIS — E782 Mixed hyperlipidemia: Secondary | ICD-10-CM | POA: Diagnosis not present

## 2015-09-10 DIAGNOSIS — J410 Simple chronic bronchitis: Secondary | ICD-10-CM | POA: Diagnosis not present

## 2015-09-10 DIAGNOSIS — M545 Low back pain: Secondary | ICD-10-CM | POA: Diagnosis not present

## 2015-09-10 DIAGNOSIS — F418 Other specified anxiety disorders: Secondary | ICD-10-CM | POA: Diagnosis not present

## 2015-11-19 DIAGNOSIS — Z0001 Encounter for general adult medical examination with abnormal findings: Secondary | ICD-10-CM | POA: Diagnosis not present

## 2015-11-19 DIAGNOSIS — E663 Overweight: Secondary | ICD-10-CM | POA: Diagnosis not present

## 2015-11-19 DIAGNOSIS — M545 Low back pain: Secondary | ICD-10-CM | POA: Diagnosis not present

## 2015-11-19 DIAGNOSIS — Z6827 Body mass index (BMI) 27.0-27.9, adult: Secondary | ICD-10-CM | POA: Diagnosis not present

## 2015-11-19 DIAGNOSIS — N958 Other specified menopausal and perimenopausal disorders: Secondary | ICD-10-CM | POA: Diagnosis not present

## 2015-12-13 DIAGNOSIS — E119 Type 2 diabetes mellitus without complications: Secondary | ICD-10-CM | POA: Diagnosis not present

## 2015-12-13 DIAGNOSIS — R0789 Other chest pain: Secondary | ICD-10-CM | POA: Diagnosis not present

## 2015-12-13 DIAGNOSIS — F320 Major depressive disorder, single episode, mild: Secondary | ICD-10-CM | POA: Diagnosis not present

## 2015-12-13 DIAGNOSIS — Z23 Encounter for immunization: Secondary | ICD-10-CM | POA: Diagnosis not present

## 2015-12-13 DIAGNOSIS — R7301 Impaired fasting glucose: Secondary | ICD-10-CM | POA: Diagnosis not present

## 2015-12-13 DIAGNOSIS — M545 Low back pain: Secondary | ICD-10-CM | POA: Diagnosis not present

## 2015-12-13 DIAGNOSIS — E782 Mixed hyperlipidemia: Secondary | ICD-10-CM | POA: Diagnosis not present

## 2015-12-13 DIAGNOSIS — J410 Simple chronic bronchitis: Secondary | ICD-10-CM | POA: Diagnosis not present

## 2015-12-16 DIAGNOSIS — R0781 Pleurodynia: Secondary | ICD-10-CM | POA: Diagnosis not present

## 2015-12-16 DIAGNOSIS — M810 Age-related osteoporosis without current pathological fracture: Secondary | ICD-10-CM | POA: Diagnosis not present

## 2015-12-16 DIAGNOSIS — N959 Unspecified menopausal and perimenopausal disorder: Secondary | ICD-10-CM | POA: Diagnosis not present

## 2015-12-24 DIAGNOSIS — Z981 Arthrodesis status: Secondary | ICD-10-CM | POA: Diagnosis not present

## 2015-12-24 DIAGNOSIS — M4856XA Collapsed vertebra, not elsewhere classified, lumbar region, initial encounter for fracture: Secondary | ICD-10-CM | POA: Diagnosis not present

## 2015-12-24 DIAGNOSIS — M4854XA Collapsed vertebra, not elsewhere classified, thoracic region, initial encounter for fracture: Secondary | ICD-10-CM | POA: Diagnosis not present

## 2015-12-24 DIAGNOSIS — G8929 Other chronic pain: Secondary | ICD-10-CM | POA: Diagnosis not present

## 2015-12-24 DIAGNOSIS — M545 Low back pain: Secondary | ICD-10-CM | POA: Diagnosis not present

## 2016-01-15 DIAGNOSIS — M47816 Spondylosis without myelopathy or radiculopathy, lumbar region: Secondary | ICD-10-CM | POA: Diagnosis not present

## 2016-01-15 DIAGNOSIS — M545 Low back pain: Secondary | ICD-10-CM | POA: Diagnosis not present

## 2016-01-15 DIAGNOSIS — R202 Paresthesia of skin: Secondary | ICD-10-CM | POA: Diagnosis not present

## 2016-01-15 DIAGNOSIS — M4854XA Collapsed vertebra, not elsewhere classified, thoracic region, initial encounter for fracture: Secondary | ICD-10-CM | POA: Diagnosis not present

## 2016-01-15 DIAGNOSIS — M5440 Lumbago with sciatica, unspecified side: Secondary | ICD-10-CM | POA: Diagnosis not present

## 2016-01-15 DIAGNOSIS — Z981 Arthrodesis status: Secondary | ICD-10-CM | POA: Diagnosis not present

## 2016-01-15 DIAGNOSIS — M5126 Other intervertebral disc displacement, lumbar region: Secondary | ICD-10-CM | POA: Diagnosis not present

## 2016-01-15 DIAGNOSIS — G8929 Other chronic pain: Secondary | ICD-10-CM | POA: Diagnosis not present

## 2016-01-17 DIAGNOSIS — I471 Supraventricular tachycardia: Secondary | ICD-10-CM | POA: Diagnosis not present

## 2016-01-17 DIAGNOSIS — I7091 Generalized atherosclerosis: Secondary | ICD-10-CM | POA: Diagnosis not present

## 2016-01-17 DIAGNOSIS — M545 Low back pain: Secondary | ICD-10-CM | POA: Diagnosis not present

## 2016-01-23 DIAGNOSIS — M810 Age-related osteoporosis without current pathological fracture: Secondary | ICD-10-CM | POA: Diagnosis not present

## 2016-01-31 DIAGNOSIS — I70219 Atherosclerosis of native arteries of extremities with intermittent claudication, unspecified extremity: Secondary | ICD-10-CM | POA: Diagnosis not present

## 2016-01-31 DIAGNOSIS — E784 Other hyperlipidemia: Secondary | ICD-10-CM | POA: Diagnosis not present

## 2016-01-31 DIAGNOSIS — Z6826 Body mass index (BMI) 26.0-26.9, adult: Secondary | ICD-10-CM | POA: Diagnosis not present

## 2016-01-31 DIAGNOSIS — I10 Essential (primary) hypertension: Secondary | ICD-10-CM | POA: Diagnosis not present

## 2016-01-31 DIAGNOSIS — I471 Supraventricular tachycardia: Secondary | ICD-10-CM | POA: Diagnosis not present

## 2016-01-31 DIAGNOSIS — F17200 Nicotine dependence, unspecified, uncomplicated: Secondary | ICD-10-CM | POA: Diagnosis not present

## 2016-01-31 DIAGNOSIS — J449 Chronic obstructive pulmonary disease, unspecified: Secondary | ICD-10-CM | POA: Diagnosis not present

## 2016-01-31 DIAGNOSIS — Q278 Other specified congenital malformations of peripheral vascular system: Secondary | ICD-10-CM | POA: Diagnosis not present

## 2016-02-17 DIAGNOSIS — F331 Major depressive disorder, recurrent, moderate: Secondary | ICD-10-CM | POA: Diagnosis not present

## 2016-02-17 DIAGNOSIS — S22001G Stable burst fracture of unspecified thoracic vertebra, subsequent encounter for fracture with delayed healing: Secondary | ICD-10-CM | POA: Diagnosis not present

## 2016-02-17 DIAGNOSIS — M545 Low back pain: Secondary | ICD-10-CM | POA: Diagnosis not present

## 2016-02-18 DIAGNOSIS — I70219 Atherosclerosis of native arteries of extremities with intermittent claudication, unspecified extremity: Secondary | ICD-10-CM | POA: Diagnosis not present

## 2016-02-18 DIAGNOSIS — I6523 Occlusion and stenosis of bilateral carotid arteries: Secondary | ICD-10-CM | POA: Diagnosis not present

## 2016-03-16 DIAGNOSIS — E119 Type 2 diabetes mellitus without complications: Secondary | ICD-10-CM | POA: Diagnosis not present

## 2016-03-16 DIAGNOSIS — M545 Low back pain: Secondary | ICD-10-CM | POA: Diagnosis not present

## 2016-03-16 DIAGNOSIS — K219 Gastro-esophageal reflux disease without esophagitis: Secondary | ICD-10-CM | POA: Diagnosis not present

## 2016-03-16 DIAGNOSIS — M353 Polymyalgia rheumatica: Secondary | ICD-10-CM | POA: Diagnosis not present

## 2016-03-16 DIAGNOSIS — E782 Mixed hyperlipidemia: Secondary | ICD-10-CM | POA: Diagnosis not present

## 2016-03-16 DIAGNOSIS — J410 Simple chronic bronchitis: Secondary | ICD-10-CM | POA: Diagnosis not present

## 2016-03-16 DIAGNOSIS — F320 Major depressive disorder, single episode, mild: Secondary | ICD-10-CM | POA: Diagnosis not present

## 2016-03-29 DIAGNOSIS — K529 Noninfective gastroenteritis and colitis, unspecified: Secondary | ICD-10-CM | POA: Diagnosis not present

## 2016-03-29 DIAGNOSIS — R1110 Vomiting, unspecified: Secondary | ICD-10-CM | POA: Diagnosis not present

## 2016-03-29 DIAGNOSIS — R197 Diarrhea, unspecified: Secondary | ICD-10-CM | POA: Diagnosis not present

## 2016-04-07 DIAGNOSIS — M5440 Lumbago with sciatica, unspecified side: Secondary | ICD-10-CM | POA: Diagnosis not present

## 2016-04-07 DIAGNOSIS — M545 Low back pain: Secondary | ICD-10-CM | POA: Diagnosis not present

## 2016-04-07 DIAGNOSIS — G8929 Other chronic pain: Secondary | ICD-10-CM | POA: Diagnosis not present

## 2016-04-21 DIAGNOSIS — M545 Low back pain: Secondary | ICD-10-CM | POA: Diagnosis not present

## 2016-04-21 DIAGNOSIS — M546 Pain in thoracic spine: Secondary | ICD-10-CM | POA: Diagnosis not present

## 2016-05-04 DIAGNOSIS — M545 Low back pain: Secondary | ICD-10-CM | POA: Diagnosis not present

## 2016-05-14 IMAGING — MR MR CERVICAL SPINE W/O CM
4 of 5 series · 19 of 48 positions shown · non-contrast
Comparison: None.

CLINICAL DATA: Neck pain and headaches. Gait instability. Frequent
falls. Hyper reflexia of the lower extremity. Bilateral occipital
neuralgia.

EXAM:
MRI CERVICAL SPINE WITHOUT CONTRAST
TECHNIQUE: Multiplanar, multisequence MR imaging of the cervical spine was
performed. No intravenous contrast was administered.

[Series 2: T2 · sagittal · 3.0mm · 0.43mm/px · 6 of 13 slices shown (1 of 2)]
[im 1/13]
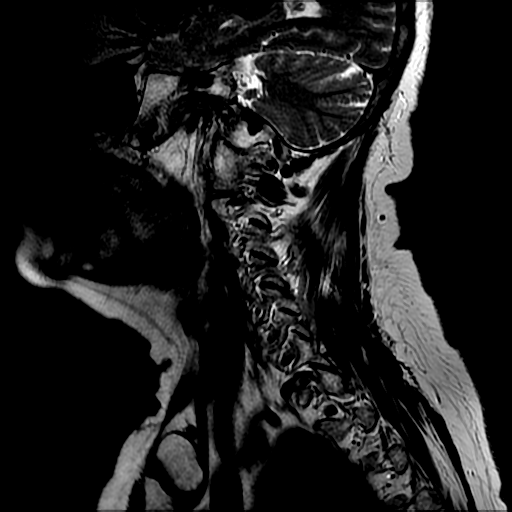
[im 3/13]
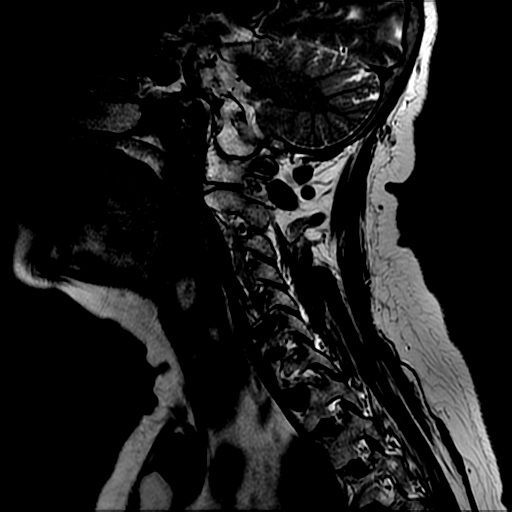
[im 5/13]
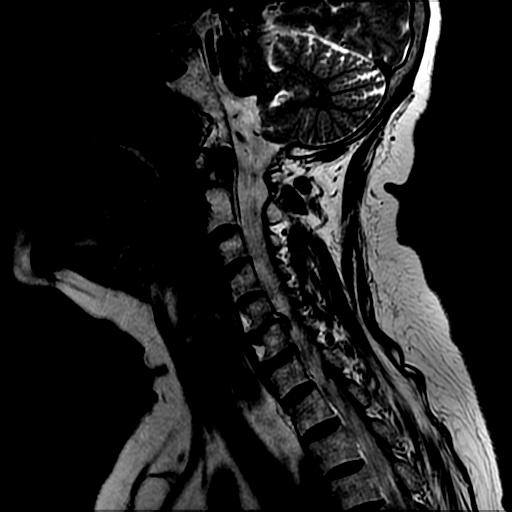
[im 8/13]
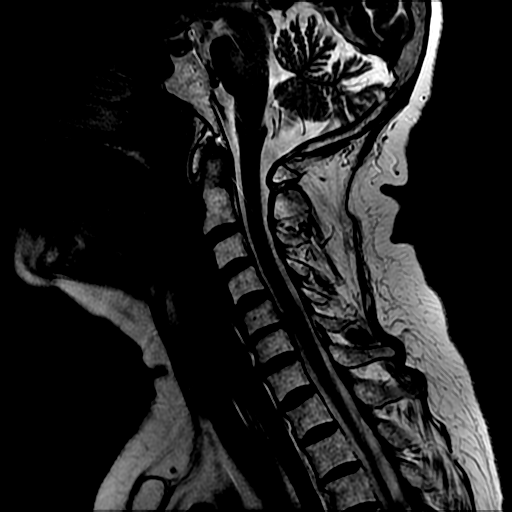
[im 10/13]
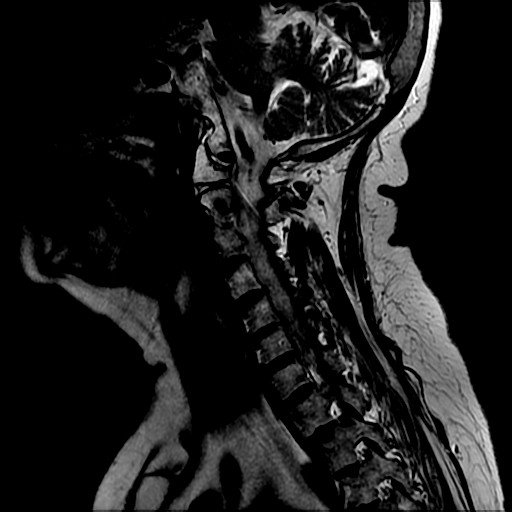
[im 13/13]
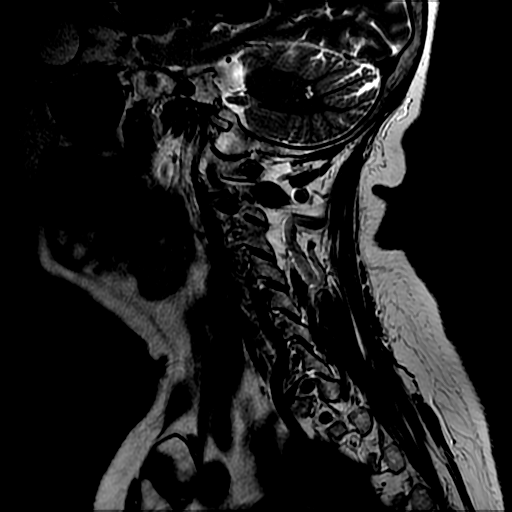

[Series 3: T1 · sagittal · 3.0mm · 0.43mm/px · 3 of 13 slices shown]
[im 3/13]
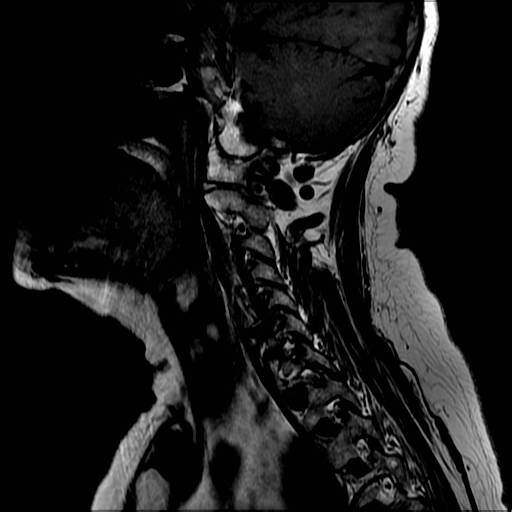
[im 7/13]
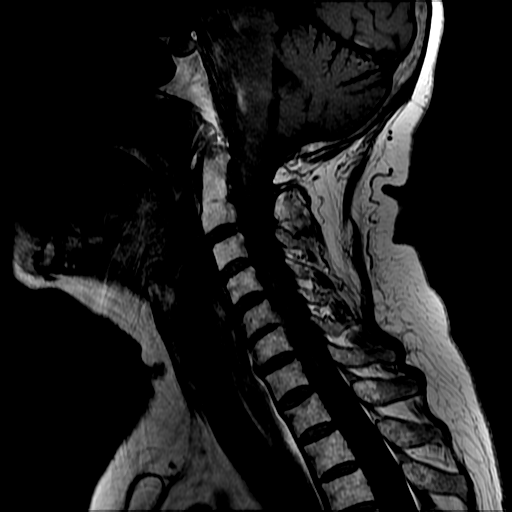
[im 11/13]
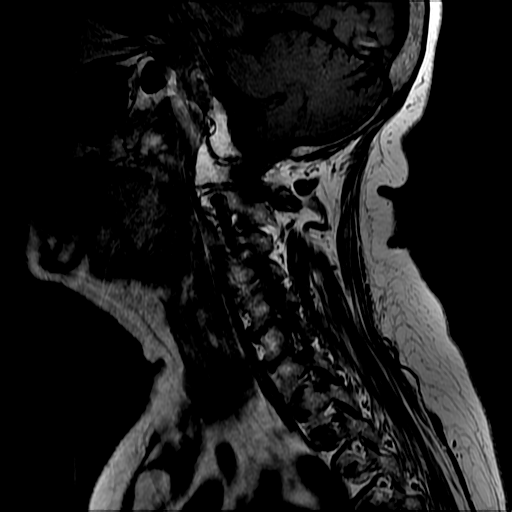

[Series 5: ax 2d merge · axial · 3.0mm · 0.39mm/px · z∈[-49,+9]mm · 3 of 26 slices shown]
[im 4/26]
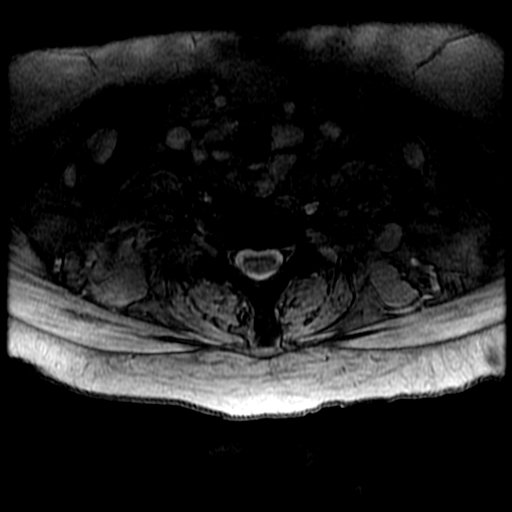
[im 14/26]
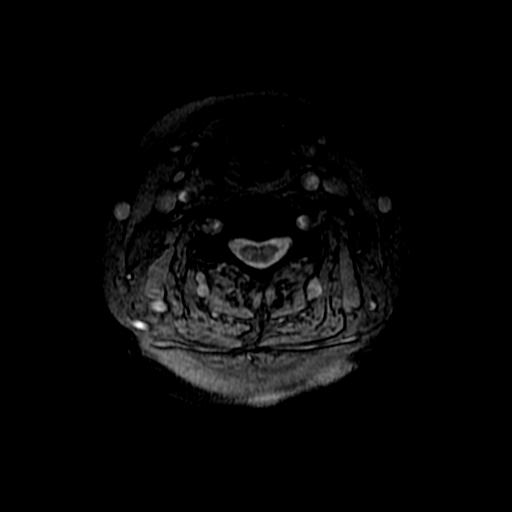
[im 22/26]
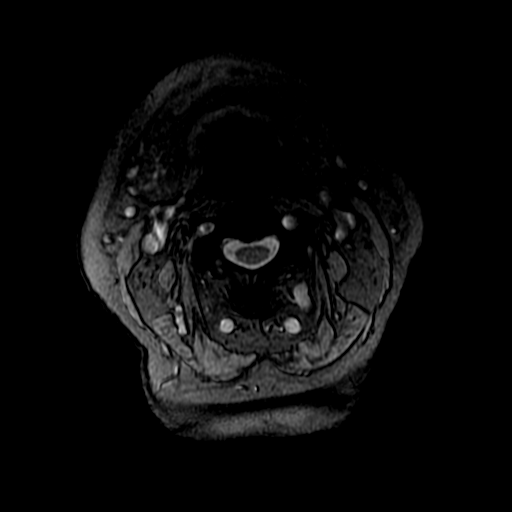

[Series 6: T2 · axial · 3.0mm · 0.39mm/px · z∈[-59,+9]mm · 7 of 26 slices shown (2 of 2)]
[im 1/26]
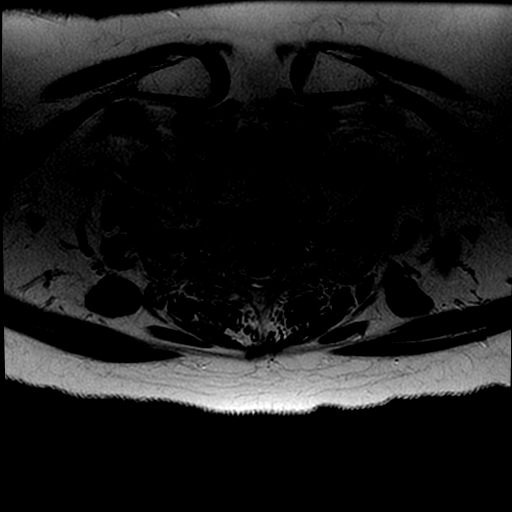
[im 4/26]
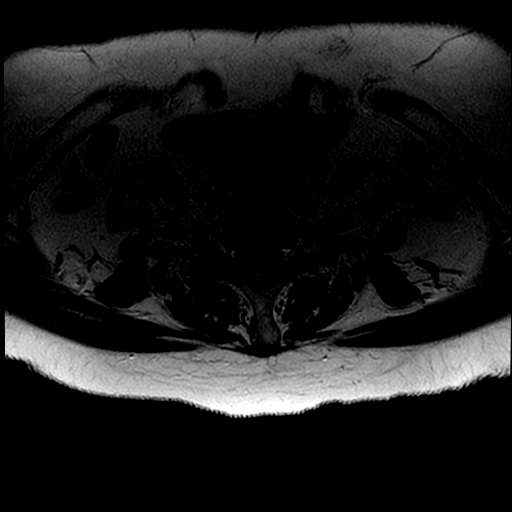
[im 8/26]
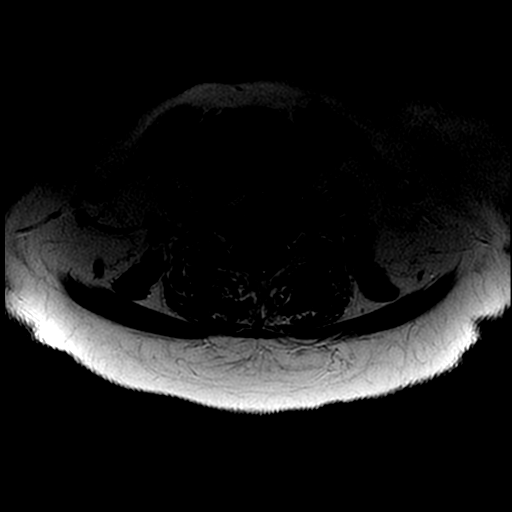
[im 12/26]
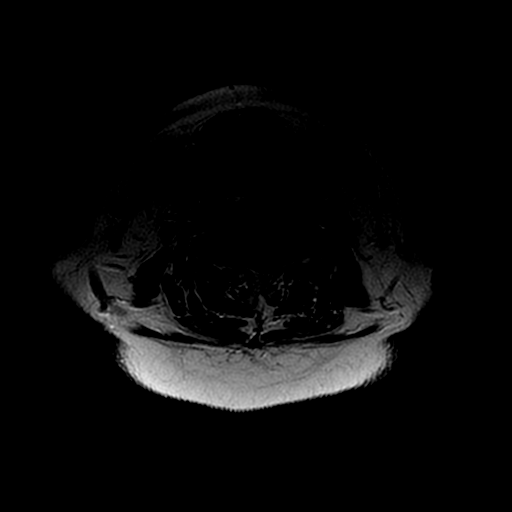
[im 14/26]
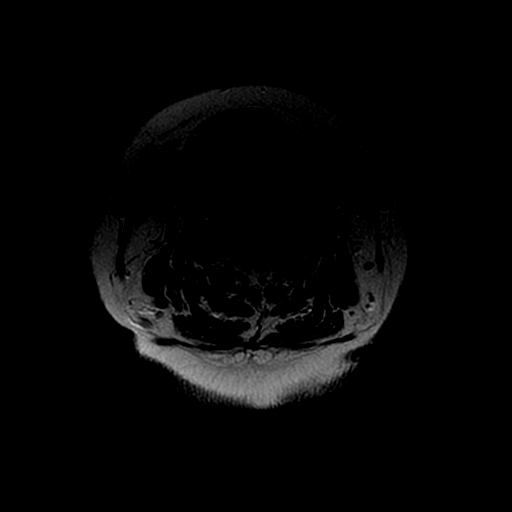
[im 18/26]
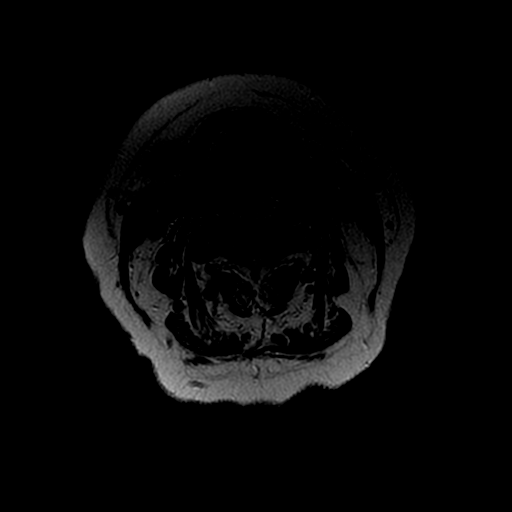
[im 22/26]
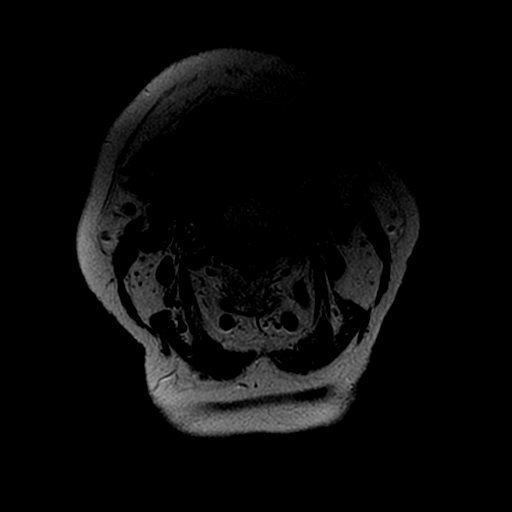

[19 of 48 positions shown; findings below may reference images not displayed]

FINDINGS: There are chronic microvascular ischemic changes in the pons. The
other visualized intracranial structures are normal. Paraspinal soft
tissues are normal. Cervical spinal cord demonstrates no mass or
myelopathy or spinal cord compression.

There is no facet arthritis in the cervical spine.

Craniocervical junction through C4-5:  Normal.

C5-6: Disc space narrowing with a small broad-based disc osteophyte
complex with more prominent osteophytes extending into both neural
foramina. The spinal cord is slightly deviated posteriorly but not
compressed.

C6-7: Tiny disc bulges to the right and left of midline with no
neural impingement.

C7-T1 through T2-3:  Normal.
IMPRESSION: 1. Degenerative disc disease at C5-6 with bilateral foraminal
narrowing which could affect the C6 nerves.
2. Otherwise, essentially normal CT scan of the cervical spine.

## 2016-05-15 ENCOUNTER — Other Ambulatory Visit: Payer: Self-pay | Admitting: Neurological Surgery

## 2016-05-15 DIAGNOSIS — M545 Low back pain: Secondary | ICD-10-CM

## 2016-05-20 ENCOUNTER — Ambulatory Visit
Admission: RE | Admit: 2016-05-20 | Discharge: 2016-05-20 | Disposition: A | Discharge status: Home / Self Care | Payer: Medicare Other | Source: Ambulatory Visit | Attending: Neurological Surgery | Admitting: Neurological Surgery

## 2016-05-20 DIAGNOSIS — M5124 Other intervertebral disc displacement, thoracic region: Secondary | ICD-10-CM | POA: Diagnosis not present

## 2016-05-20 DIAGNOSIS — M5126 Other intervertebral disc displacement, lumbar region: Secondary | ICD-10-CM | POA: Diagnosis not present

## 2016-05-20 DIAGNOSIS — M545 Low back pain: Secondary | ICD-10-CM

## 2016-05-29 DIAGNOSIS — M545 Low back pain: Secondary | ICD-10-CM | POA: Diagnosis not present

## 2016-06-22 DIAGNOSIS — H40013 Open angle with borderline findings, low risk, bilateral: Secondary | ICD-10-CM | POA: Diagnosis not present

## 2016-06-22 DIAGNOSIS — H26493 Other secondary cataract, bilateral: Secondary | ICD-10-CM | POA: Diagnosis not present

## 2016-07-30 DIAGNOSIS — E785 Hyperlipidemia, unspecified: Secondary | ICD-10-CM | POA: Diagnosis not present

## 2016-07-30 DIAGNOSIS — Z6825 Body mass index (BMI) 25.0-25.9, adult: Secondary | ICD-10-CM | POA: Diagnosis not present

## 2016-07-30 DIAGNOSIS — R002 Palpitations: Secondary | ICD-10-CM | POA: Diagnosis not present

## 2016-07-30 DIAGNOSIS — I70219 Atherosclerosis of native arteries of extremities with intermittent claudication, unspecified extremity: Secondary | ICD-10-CM | POA: Diagnosis not present

## 2016-07-30 DIAGNOSIS — I471 Supraventricular tachycardia: Secondary | ICD-10-CM | POA: Diagnosis not present

## 2016-07-30 DIAGNOSIS — I10 Essential (primary) hypertension: Secondary | ICD-10-CM | POA: Diagnosis not present

## 2016-08-05 DIAGNOSIS — M25562 Pain in left knee: Secondary | ICD-10-CM | POA: Diagnosis not present

## 2016-08-05 DIAGNOSIS — E782 Mixed hyperlipidemia: Secondary | ICD-10-CM | POA: Diagnosis not present

## 2016-08-05 DIAGNOSIS — E119 Type 2 diabetes mellitus without complications: Secondary | ICD-10-CM | POA: Diagnosis not present

## 2016-08-05 DIAGNOSIS — M545 Low back pain: Secondary | ICD-10-CM | POA: Diagnosis not present

## 2016-08-05 DIAGNOSIS — J410 Simple chronic bronchitis: Secondary | ICD-10-CM | POA: Diagnosis not present

## 2016-09-11 DIAGNOSIS — M810 Age-related osteoporosis without current pathological fracture: Secondary | ICD-10-CM | POA: Diagnosis not present

## 2016-10-26 ENCOUNTER — Other Ambulatory Visit: Payer: Self-pay

## 2016-11-09 DIAGNOSIS — Z79891 Long term (current) use of opiate analgesic: Secondary | ICD-10-CM | POA: Diagnosis not present

## 2016-11-09 DIAGNOSIS — J410 Simple chronic bronchitis: Secondary | ICD-10-CM | POA: Diagnosis not present

## 2016-11-09 DIAGNOSIS — I471 Supraventricular tachycardia: Secondary | ICD-10-CM | POA: Diagnosis not present

## 2016-11-09 DIAGNOSIS — E119 Type 2 diabetes mellitus without complications: Secondary | ICD-10-CM | POA: Diagnosis not present

## 2016-11-09 DIAGNOSIS — M545 Low back pain: Secondary | ICD-10-CM | POA: Diagnosis not present

## 2016-11-09 DIAGNOSIS — E782 Mixed hyperlipidemia: Secondary | ICD-10-CM | POA: Diagnosis not present

## 2016-11-09 DIAGNOSIS — J302 Other seasonal allergic rhinitis: Secondary | ICD-10-CM | POA: Diagnosis not present

## 2016-11-23 DIAGNOSIS — J208 Acute bronchitis due to other specified organisms: Secondary | ICD-10-CM | POA: Diagnosis not present

## 2016-11-23 DIAGNOSIS — J018 Other acute sinusitis: Secondary | ICD-10-CM | POA: Diagnosis not present

## 2016-11-26 DIAGNOSIS — T849XXA Unspecified complication of internal orthopedic prosthetic device, implant and graft, initial encounter: Secondary | ICD-10-CM | POA: Diagnosis not present

## 2016-11-26 DIAGNOSIS — S22080A Wedge compression fracture of T11-T12 vertebra, initial encounter for closed fracture: Secondary | ICD-10-CM | POA: Diagnosis not present

## 2016-11-26 DIAGNOSIS — Z95820 Peripheral vascular angioplasty status with implants and grafts: Secondary | ICD-10-CM | POA: Diagnosis not present

## 2016-11-26 DIAGNOSIS — I998 Other disorder of circulatory system: Secondary | ICD-10-CM | POA: Diagnosis not present

## 2016-11-26 DIAGNOSIS — M5135 Other intervertebral disc degeneration, thoracolumbar region: Secondary | ICD-10-CM | POA: Diagnosis not present

## 2016-11-26 DIAGNOSIS — T84296A Other mechanical complication of internal fixation device of vertebrae, initial encounter: Secondary | ICD-10-CM | POA: Diagnosis not present

## 2016-11-26 DIAGNOSIS — Z981 Arthrodesis status: Secondary | ICD-10-CM | POA: Diagnosis not present

## 2016-11-26 DIAGNOSIS — M4184 Other forms of scoliosis, thoracic region: Secondary | ICD-10-CM | POA: Diagnosis not present

## 2016-11-26 DIAGNOSIS — M8580 Other specified disorders of bone density and structure, unspecified site: Secondary | ICD-10-CM | POA: Diagnosis not present

## 2016-11-26 DIAGNOSIS — I6522 Occlusion and stenosis of left carotid artery: Secondary | ICD-10-CM | POA: Diagnosis not present

## 2016-12-14 DIAGNOSIS — Z23 Encounter for immunization: Secondary | ICD-10-CM | POA: Diagnosis not present

## 2016-12-22 DIAGNOSIS — L570 Actinic keratosis: Secondary | ICD-10-CM | POA: Diagnosis not present

## 2016-12-29 DIAGNOSIS — E785 Hyperlipidemia, unspecified: Secondary | ICD-10-CM | POA: Diagnosis not present

## 2016-12-29 DIAGNOSIS — J449 Chronic obstructive pulmonary disease, unspecified: Secondary | ICD-10-CM | POA: Diagnosis not present

## 2016-12-29 DIAGNOSIS — I9589 Other hypotension: Secondary | ICD-10-CM | POA: Diagnosis not present

## 2016-12-29 DIAGNOSIS — I509 Heart failure, unspecified: Secondary | ICD-10-CM | POA: Diagnosis not present

## 2016-12-29 DIAGNOSIS — G8918 Other acute postprocedural pain: Secondary | ICD-10-CM | POA: Diagnosis not present

## 2016-12-29 DIAGNOSIS — G8929 Other chronic pain: Secondary | ICD-10-CM | POA: Diagnosis not present

## 2016-12-29 DIAGNOSIS — T8489XA Other specified complication of internal orthopedic prosthetic devices, implants and grafts, initial encounter: Secondary | ICD-10-CM | POA: Diagnosis not present

## 2016-12-29 DIAGNOSIS — M40204 Unspecified kyphosis, thoracic region: Secondary | ICD-10-CM | POA: Diagnosis not present

## 2016-12-29 DIAGNOSIS — R9431 Abnormal electrocardiogram [ECG] [EKG]: Secondary | ICD-10-CM | POA: Diagnosis not present

## 2016-12-29 DIAGNOSIS — Z8673 Personal history of transient ischemic attack (TIA), and cerebral infarction without residual deficits: Secondary | ICD-10-CM | POA: Diagnosis not present

## 2016-12-29 DIAGNOSIS — M549 Dorsalgia, unspecified: Secondary | ICD-10-CM | POA: Diagnosis not present

## 2016-12-29 DIAGNOSIS — I2699 Other pulmonary embolism without acute cor pulmonale: Secondary | ICD-10-CM | POA: Diagnosis not present

## 2016-12-29 DIAGNOSIS — I70219 Atherosclerosis of native arteries of extremities with intermittent claudication, unspecified extremity: Secondary | ICD-10-CM | POA: Diagnosis not present

## 2016-12-29 DIAGNOSIS — F329 Major depressive disorder, single episode, unspecified: Secondary | ICD-10-CM | POA: Diagnosis not present

## 2016-12-29 DIAGNOSIS — S22088K Other fracture of T11-T12 vertebra, subsequent encounter for fracture with nonunion: Secondary | ICD-10-CM | POA: Diagnosis not present

## 2016-12-29 DIAGNOSIS — I5032 Chronic diastolic (congestive) heart failure: Secondary | ICD-10-CM | POA: Diagnosis not present

## 2016-12-29 DIAGNOSIS — I110 Hypertensive heart disease with heart failure: Secondary | ICD-10-CM | POA: Diagnosis not present

## 2016-12-29 DIAGNOSIS — I2510 Atherosclerotic heart disease of native coronary artery without angina pectoris: Secondary | ICD-10-CM | POA: Diagnosis not present

## 2016-12-29 DIAGNOSIS — R0902 Hypoxemia: Secondary | ICD-10-CM | POA: Diagnosis not present

## 2017-01-08 DIAGNOSIS — R109 Unspecified abdominal pain: Secondary | ICD-10-CM | POA: Diagnosis not present

## 2017-01-08 DIAGNOSIS — T849XXS Unspecified complication of internal orthopedic prosthetic device, implant and graft, sequela: Secondary | ICD-10-CM | POA: Diagnosis not present

## 2017-01-08 DIAGNOSIS — Z472 Encounter for removal of internal fixation device: Secondary | ICD-10-CM | POA: Diagnosis not present

## 2017-01-08 DIAGNOSIS — Z8673 Personal history of transient ischemic attack (TIA), and cerebral infarction without residual deficits: Secondary | ICD-10-CM | POA: Diagnosis not present

## 2017-01-08 DIAGNOSIS — Z4789 Encounter for other orthopedic aftercare: Secondary | ICD-10-CM | POA: Diagnosis not present

## 2017-01-08 DIAGNOSIS — G8918 Other acute postprocedural pain: Secondary | ICD-10-CM | POA: Diagnosis not present

## 2017-01-08 DIAGNOSIS — I509 Heart failure, unspecified: Secondary | ICD-10-CM | POA: Diagnosis present

## 2017-01-08 DIAGNOSIS — I70219 Atherosclerosis of native arteries of extremities with intermittent claudication, unspecified extremity: Secondary | ICD-10-CM | POA: Diagnosis present

## 2017-01-08 DIAGNOSIS — F329 Major depressive disorder, single episode, unspecified: Secondary | ICD-10-CM | POA: Diagnosis present

## 2017-01-08 DIAGNOSIS — S22088K Other fracture of T11-T12 vertebra, subsequent encounter for fracture with nonunion: Secondary | ICD-10-CM | POA: Diagnosis not present

## 2017-01-08 DIAGNOSIS — J9811 Atelectasis: Secondary | ICD-10-CM | POA: Diagnosis not present

## 2017-01-08 DIAGNOSIS — T8489XA Other specified complication of internal orthopedic prosthetic devices, implants and grafts, initial encounter: Secondary | ICD-10-CM | POA: Diagnosis present

## 2017-01-08 DIAGNOSIS — I9589 Other hypotension: Secondary | ICD-10-CM | POA: Diagnosis present

## 2017-01-08 DIAGNOSIS — R0602 Shortness of breath: Secondary | ICD-10-CM | POA: Diagnosis not present

## 2017-01-08 DIAGNOSIS — I5032 Chronic diastolic (congestive) heart failure: Secondary | ICD-10-CM | POA: Diagnosis present

## 2017-01-08 DIAGNOSIS — I2510 Atherosclerotic heart disease of native coronary artery without angina pectoris: Secondary | ICD-10-CM | POA: Diagnosis present

## 2017-01-08 DIAGNOSIS — J9601 Acute respiratory failure with hypoxia: Secondary | ICD-10-CM | POA: Diagnosis not present

## 2017-01-08 DIAGNOSIS — S22080D Wedge compression fracture of T11-T12 vertebra, subsequent encounter for fracture with routine healing: Secondary | ICD-10-CM | POA: Diagnosis not present

## 2017-01-08 DIAGNOSIS — E785 Hyperlipidemia, unspecified: Secondary | ICD-10-CM | POA: Diagnosis present

## 2017-01-08 DIAGNOSIS — I110 Hypertensive heart disease with heart failure: Secondary | ICD-10-CM | POA: Diagnosis present

## 2017-01-08 DIAGNOSIS — M532X5 Spinal instabilities, thoracolumbar region: Secondary | ICD-10-CM | POA: Diagnosis not present

## 2017-01-08 DIAGNOSIS — Z981 Arthrodesis status: Secondary | ICD-10-CM | POA: Diagnosis not present

## 2017-01-08 DIAGNOSIS — R1013 Epigastric pain: Secondary | ICD-10-CM | POA: Diagnosis not present

## 2017-01-08 DIAGNOSIS — M47816 Spondylosis without myelopathy or radiculopathy, lumbar region: Secondary | ICD-10-CM | POA: Diagnosis not present

## 2017-01-08 DIAGNOSIS — Z7902 Long term (current) use of antithrombotics/antiplatelets: Secondary | ICD-10-CM | POA: Diagnosis not present

## 2017-01-08 DIAGNOSIS — I272 Pulmonary hypertension, unspecified: Secondary | ICD-10-CM | POA: Diagnosis not present

## 2017-01-08 DIAGNOSIS — R918 Other nonspecific abnormal finding of lung field: Secondary | ICD-10-CM | POA: Diagnosis not present

## 2017-01-08 DIAGNOSIS — Z95828 Presence of other vascular implants and grafts: Secondary | ICD-10-CM | POA: Diagnosis not present

## 2017-01-08 DIAGNOSIS — M40204 Unspecified kyphosis, thoracic region: Secondary | ICD-10-CM | POA: Diagnosis present

## 2017-01-08 DIAGNOSIS — J90 Pleural effusion, not elsewhere classified: Secondary | ICD-10-CM | POA: Diagnosis not present

## 2017-01-08 DIAGNOSIS — R079 Chest pain, unspecified: Secondary | ICD-10-CM | POA: Diagnosis not present

## 2017-01-08 DIAGNOSIS — R0902 Hypoxemia: Secondary | ICD-10-CM | POA: Diagnosis not present

## 2017-01-08 DIAGNOSIS — I959 Hypotension, unspecified: Secondary | ICD-10-CM | POA: Diagnosis not present

## 2017-01-08 DIAGNOSIS — G8929 Other chronic pain: Secondary | ICD-10-CM | POA: Diagnosis not present

## 2017-01-08 DIAGNOSIS — J449 Chronic obstructive pulmonary disease, unspecified: Secondary | ICD-10-CM | POA: Diagnosis not present

## 2017-01-08 DIAGNOSIS — I10 Essential (primary) hypertension: Secondary | ICD-10-CM | POA: Diagnosis not present

## 2017-01-08 DIAGNOSIS — M549 Dorsalgia, unspecified: Secondary | ICD-10-CM | POA: Diagnosis present

## 2017-01-08 DIAGNOSIS — T849XXA Unspecified complication of internal orthopedic prosthetic device, implant and graft, initial encounter: Secondary | ICD-10-CM | POA: Diagnosis not present

## 2017-01-08 DIAGNOSIS — I998 Other disorder of circulatory system: Secondary | ICD-10-CM | POA: Diagnosis not present

## 2017-01-08 DIAGNOSIS — R1011 Right upper quadrant pain: Secondary | ICD-10-CM | POA: Diagnosis not present

## 2017-01-08 DIAGNOSIS — M960 Pseudarthrosis after fusion or arthrodesis: Secondary | ICD-10-CM | POA: Diagnosis not present

## 2017-01-08 DIAGNOSIS — I071 Rheumatic tricuspid insufficiency: Secondary | ICD-10-CM | POA: Diagnosis not present

## 2017-01-08 DIAGNOSIS — Z7901 Long term (current) use of anticoagulants: Secondary | ICD-10-CM | POA: Diagnosis not present

## 2017-01-08 DIAGNOSIS — I471 Supraventricular tachycardia: Secondary | ICD-10-CM | POA: Diagnosis not present

## 2017-01-08 DIAGNOSIS — I2699 Other pulmonary embolism without acute cor pulmonale: Secondary | ICD-10-CM | POA: Diagnosis not present

## 2017-01-08 DIAGNOSIS — M8580 Other specified disorders of bone density and structure, unspecified site: Secondary | ICD-10-CM | POA: Diagnosis not present

## 2017-01-12 DIAGNOSIS — I2699 Other pulmonary embolism without acute cor pulmonale: Secondary | ICD-10-CM | POA: Insufficient documentation

## 2017-01-18 DIAGNOSIS — I10 Essential (primary) hypertension: Secondary | ICD-10-CM | POA: Diagnosis not present

## 2017-01-18 DIAGNOSIS — T849XXD Unspecified complication of internal orthopedic prosthetic device, implant and graft, subsequent encounter: Secondary | ICD-10-CM | POA: Diagnosis not present

## 2017-01-19 DIAGNOSIS — Z4789 Encounter for other orthopedic aftercare: Secondary | ICD-10-CM | POA: Diagnosis not present

## 2017-01-19 DIAGNOSIS — Z981 Arthrodesis status: Secondary | ICD-10-CM | POA: Diagnosis not present

## 2017-01-20 DIAGNOSIS — T849XXD Unspecified complication of internal orthopedic prosthetic device, implant and graft, subsequent encounter: Secondary | ICD-10-CM | POA: Diagnosis not present

## 2017-01-20 DIAGNOSIS — I10 Essential (primary) hypertension: Secondary | ICD-10-CM | POA: Diagnosis not present

## 2017-01-21 DIAGNOSIS — M6281 Muscle weakness (generalized): Secondary | ICD-10-CM | POA: Diagnosis not present

## 2017-01-21 DIAGNOSIS — I951 Orthostatic hypotension: Secondary | ICD-10-CM | POA: Diagnosis not present

## 2017-01-21 DIAGNOSIS — I10 Essential (primary) hypertension: Secondary | ICD-10-CM | POA: Diagnosis not present

## 2017-01-21 DIAGNOSIS — I471 Supraventricular tachycardia: Secondary | ICD-10-CM | POA: Diagnosis not present

## 2017-01-21 DIAGNOSIS — M4324 Fusion of spine, thoracic region: Secondary | ICD-10-CM | POA: Diagnosis not present

## 2017-01-21 DIAGNOSIS — I2699 Other pulmonary embolism without acute cor pulmonale: Secondary | ICD-10-CM | POA: Diagnosis not present

## 2017-01-21 DIAGNOSIS — T849XXD Unspecified complication of internal orthopedic prosthetic device, implant and graft, subsequent encounter: Secondary | ICD-10-CM | POA: Diagnosis not present

## 2017-01-21 DIAGNOSIS — I270 Primary pulmonary hypertension: Secondary | ICD-10-CM | POA: Diagnosis not present

## 2017-01-21 DIAGNOSIS — R1011 Right upper quadrant pain: Secondary | ICD-10-CM | POA: Diagnosis not present

## 2017-01-22 DIAGNOSIS — I2699 Other pulmonary embolism without acute cor pulmonale: Secondary | ICD-10-CM | POA: Diagnosis not present

## 2017-01-22 DIAGNOSIS — J9620 Acute and chronic respiratory failure, unspecified whether with hypoxia or hypercapnia: Secondary | ICD-10-CM | POA: Diagnosis not present

## 2017-01-22 DIAGNOSIS — I951 Orthostatic hypotension: Secondary | ICD-10-CM | POA: Diagnosis not present

## 2017-01-22 DIAGNOSIS — M6281 Muscle weakness (generalized): Secondary | ICD-10-CM | POA: Diagnosis not present

## 2017-01-22 DIAGNOSIS — I270 Primary pulmonary hypertension: Secondary | ICD-10-CM | POA: Diagnosis not present

## 2017-01-22 DIAGNOSIS — M4324 Fusion of spine, thoracic region: Secondary | ICD-10-CM | POA: Diagnosis not present

## 2017-01-22 DIAGNOSIS — Z4889 Encounter for other specified surgical aftercare: Secondary | ICD-10-CM | POA: Diagnosis not present

## 2017-01-22 DIAGNOSIS — I471 Supraventricular tachycardia: Secondary | ICD-10-CM | POA: Diagnosis not present

## 2017-01-22 DIAGNOSIS — G8918 Other acute postprocedural pain: Secondary | ICD-10-CM | POA: Diagnosis not present

## 2017-01-22 DIAGNOSIS — R262 Difficulty in walking, not elsewhere classified: Secondary | ICD-10-CM | POA: Diagnosis not present

## 2017-01-26 DIAGNOSIS — G8918 Other acute postprocedural pain: Secondary | ICD-10-CM | POA: Diagnosis not present

## 2017-01-26 DIAGNOSIS — I2699 Other pulmonary embolism without acute cor pulmonale: Secondary | ICD-10-CM | POA: Diagnosis not present

## 2017-01-26 DIAGNOSIS — J9620 Acute and chronic respiratory failure, unspecified whether with hypoxia or hypercapnia: Secondary | ICD-10-CM | POA: Diagnosis not present

## 2017-01-26 DIAGNOSIS — R262 Difficulty in walking, not elsewhere classified: Secondary | ICD-10-CM | POA: Diagnosis not present

## 2017-02-06 DIAGNOSIS — I10 Essential (primary) hypertension: Secondary | ICD-10-CM | POA: Diagnosis not present

## 2017-02-06 DIAGNOSIS — T849XXD Unspecified complication of internal orthopedic prosthetic device, implant and graft, subsequent encounter: Secondary | ICD-10-CM | POA: Diagnosis not present

## 2017-02-08 DIAGNOSIS — R51 Headache: Secondary | ICD-10-CM | POA: Diagnosis not present

## 2017-02-08 DIAGNOSIS — F321 Major depressive disorder, single episode, moderate: Secondary | ICD-10-CM | POA: Diagnosis not present

## 2017-02-08 DIAGNOSIS — I2699 Other pulmonary embolism without acute cor pulmonale: Secondary | ICD-10-CM | POA: Diagnosis not present

## 2017-02-08 DIAGNOSIS — M545 Low back pain: Secondary | ICD-10-CM | POA: Diagnosis not present

## 2017-02-08 DIAGNOSIS — F5102 Adjustment insomnia: Secondary | ICD-10-CM | POA: Diagnosis not present

## 2017-02-09 DIAGNOSIS — I10 Essential (primary) hypertension: Secondary | ICD-10-CM | POA: Diagnosis not present

## 2017-02-09 DIAGNOSIS — T849XXD Unspecified complication of internal orthopedic prosthetic device, implant and graft, subsequent encounter: Secondary | ICD-10-CM | POA: Diagnosis not present

## 2017-02-11 DIAGNOSIS — T849XXD Unspecified complication of internal orthopedic prosthetic device, implant and graft, subsequent encounter: Secondary | ICD-10-CM | POA: Diagnosis not present

## 2017-02-11 DIAGNOSIS — I10 Essential (primary) hypertension: Secondary | ICD-10-CM | POA: Diagnosis not present

## 2017-02-12 DIAGNOSIS — I10 Essential (primary) hypertension: Secondary | ICD-10-CM | POA: Diagnosis not present

## 2017-02-12 DIAGNOSIS — T849XXD Unspecified complication of internal orthopedic prosthetic device, implant and graft, subsequent encounter: Secondary | ICD-10-CM | POA: Diagnosis not present

## 2017-02-15 DIAGNOSIS — I10 Essential (primary) hypertension: Secondary | ICD-10-CM | POA: Diagnosis not present

## 2017-02-15 DIAGNOSIS — T849XXD Unspecified complication of internal orthopedic prosthetic device, implant and graft, subsequent encounter: Secondary | ICD-10-CM | POA: Diagnosis not present

## 2017-02-16 DIAGNOSIS — I10 Essential (primary) hypertension: Secondary | ICD-10-CM | POA: Diagnosis not present

## 2017-02-16 DIAGNOSIS — T849XXD Unspecified complication of internal orthopedic prosthetic device, implant and graft, subsequent encounter: Secondary | ICD-10-CM | POA: Diagnosis not present

## 2017-02-17 DIAGNOSIS — T849XXD Unspecified complication of internal orthopedic prosthetic device, implant and graft, subsequent encounter: Secondary | ICD-10-CM | POA: Diagnosis not present

## 2017-02-17 DIAGNOSIS — I10 Essential (primary) hypertension: Secondary | ICD-10-CM | POA: Diagnosis not present

## 2017-02-18 DIAGNOSIS — T849XXD Unspecified complication of internal orthopedic prosthetic device, implant and graft, subsequent encounter: Secondary | ICD-10-CM | POA: Diagnosis not present

## 2017-02-18 DIAGNOSIS — I10 Essential (primary) hypertension: Secondary | ICD-10-CM | POA: Diagnosis not present

## 2017-02-19 DIAGNOSIS — S22080D Wedge compression fracture of T11-T12 vertebra, subsequent encounter for fracture with routine healing: Secondary | ICD-10-CM | POA: Diagnosis not present

## 2017-02-19 DIAGNOSIS — Z95828 Presence of other vascular implants and grafts: Secondary | ICD-10-CM | POA: Diagnosis not present

## 2017-02-19 DIAGNOSIS — Z981 Arthrodesis status: Secondary | ICD-10-CM | POA: Diagnosis not present

## 2017-02-19 DIAGNOSIS — I700 Atherosclerosis of aorta: Secondary | ICD-10-CM | POA: Diagnosis not present

## 2017-02-19 DIAGNOSIS — M47816 Spondylosis without myelopathy or radiculopathy, lumbar region: Secondary | ICD-10-CM | POA: Diagnosis not present

## 2017-02-19 DIAGNOSIS — Z4789 Encounter for other orthopedic aftercare: Secondary | ICD-10-CM | POA: Diagnosis not present

## 2017-02-19 DIAGNOSIS — M47812 Spondylosis without myelopathy or radiculopathy, cervical region: Secondary | ICD-10-CM | POA: Diagnosis not present

## 2017-02-22 DIAGNOSIS — T849XXD Unspecified complication of internal orthopedic prosthetic device, implant and graft, subsequent encounter: Secondary | ICD-10-CM | POA: Diagnosis not present

## 2017-02-22 DIAGNOSIS — I10 Essential (primary) hypertension: Secondary | ICD-10-CM | POA: Diagnosis not present

## 2017-02-23 DIAGNOSIS — T849XXD Unspecified complication of internal orthopedic prosthetic device, implant and graft, subsequent encounter: Secondary | ICD-10-CM | POA: Diagnosis not present

## 2017-02-23 DIAGNOSIS — I10 Essential (primary) hypertension: Secondary | ICD-10-CM | POA: Diagnosis not present

## 2017-02-24 DIAGNOSIS — T849XXD Unspecified complication of internal orthopedic prosthetic device, implant and graft, subsequent encounter: Secondary | ICD-10-CM | POA: Diagnosis not present

## 2017-02-24 DIAGNOSIS — I10 Essential (primary) hypertension: Secondary | ICD-10-CM | POA: Diagnosis not present

## 2017-03-01 DIAGNOSIS — I10 Essential (primary) hypertension: Secondary | ICD-10-CM | POA: Diagnosis not present

## 2017-03-01 DIAGNOSIS — T849XXD Unspecified complication of internal orthopedic prosthetic device, implant and graft, subsequent encounter: Secondary | ICD-10-CM | POA: Diagnosis not present

## 2017-03-02 DIAGNOSIS — J410 Simple chronic bronchitis: Secondary | ICD-10-CM | POA: Diagnosis not present

## 2017-03-02 DIAGNOSIS — I471 Supraventricular tachycardia: Secondary | ICD-10-CM | POA: Diagnosis not present

## 2017-03-02 DIAGNOSIS — E782 Mixed hyperlipidemia: Secondary | ICD-10-CM | POA: Diagnosis not present

## 2017-03-02 DIAGNOSIS — M545 Low back pain: Secondary | ICD-10-CM | POA: Diagnosis not present

## 2017-03-02 DIAGNOSIS — F320 Major depressive disorder, single episode, mild: Secondary | ICD-10-CM | POA: Diagnosis not present

## 2017-03-02 DIAGNOSIS — J302 Other seasonal allergic rhinitis: Secondary | ICD-10-CM | POA: Diagnosis not present

## 2017-03-02 DIAGNOSIS — E119 Type 2 diabetes mellitus without complications: Secondary | ICD-10-CM | POA: Diagnosis not present

## 2017-03-03 DIAGNOSIS — I10 Essential (primary) hypertension: Secondary | ICD-10-CM | POA: Diagnosis not present

## 2017-03-03 DIAGNOSIS — T849XXD Unspecified complication of internal orthopedic prosthetic device, implant and graft, subsequent encounter: Secondary | ICD-10-CM | POA: Diagnosis not present

## 2017-03-08 DIAGNOSIS — Z0001 Encounter for general adult medical examination with abnormal findings: Secondary | ICD-10-CM | POA: Diagnosis not present

## 2017-03-08 DIAGNOSIS — J441 Chronic obstructive pulmonary disease with (acute) exacerbation: Secondary | ICD-10-CM | POA: Diagnosis not present

## 2017-03-08 DIAGNOSIS — Z6824 Body mass index (BMI) 24.0-24.9, adult: Secondary | ICD-10-CM | POA: Diagnosis not present

## 2017-03-09 DIAGNOSIS — I10 Essential (primary) hypertension: Secondary | ICD-10-CM | POA: Diagnosis not present

## 2017-03-09 DIAGNOSIS — T849XXD Unspecified complication of internal orthopedic prosthetic device, implant and graft, subsequent encounter: Secondary | ICD-10-CM | POA: Diagnosis not present

## 2017-03-10 DIAGNOSIS — I10 Essential (primary) hypertension: Secondary | ICD-10-CM | POA: Diagnosis not present

## 2017-03-10 DIAGNOSIS — T849XXD Unspecified complication of internal orthopedic prosthetic device, implant and graft, subsequent encounter: Secondary | ICD-10-CM | POA: Diagnosis not present

## 2017-03-29 DIAGNOSIS — R0602 Shortness of breath: Secondary | ICD-10-CM | POA: Diagnosis not present

## 2017-03-29 DIAGNOSIS — I10 Essential (primary) hypertension: Secondary | ICD-10-CM | POA: Diagnosis not present

## 2017-03-29 DIAGNOSIS — I2690 Septic pulmonary embolism without acute cor pulmonale: Secondary | ICD-10-CM | POA: Diagnosis not present

## 2017-03-29 DIAGNOSIS — I471 Supraventricular tachycardia: Secondary | ICD-10-CM | POA: Diagnosis not present

## 2017-03-29 DIAGNOSIS — M79604 Pain in right leg: Secondary | ICD-10-CM | POA: Diagnosis not present

## 2017-03-29 DIAGNOSIS — M79605 Pain in left leg: Secondary | ICD-10-CM | POA: Diagnosis not present

## 2017-04-08 DIAGNOSIS — R200 Anesthesia of skin: Secondary | ICD-10-CM | POA: Diagnosis not present

## 2017-04-08 DIAGNOSIS — M545 Low back pain: Secondary | ICD-10-CM | POA: Diagnosis not present

## 2017-04-08 DIAGNOSIS — M79672 Pain in left foot: Secondary | ICD-10-CM | POA: Diagnosis not present

## 2017-04-08 DIAGNOSIS — Z981 Arthrodesis status: Secondary | ICD-10-CM | POA: Diagnosis not present

## 2017-04-28 DIAGNOSIS — M810 Age-related osteoporosis without current pathological fracture: Secondary | ICD-10-CM | POA: Diagnosis not present

## 2017-05-11 DIAGNOSIS — H40013 Open angle with borderline findings, low risk, bilateral: Secondary | ICD-10-CM | POA: Diagnosis not present

## 2017-05-11 DIAGNOSIS — H5213 Myopia, bilateral: Secondary | ICD-10-CM | POA: Diagnosis not present

## 2017-06-08 DIAGNOSIS — E782 Mixed hyperlipidemia: Secondary | ICD-10-CM | POA: Diagnosis not present

## 2017-06-08 DIAGNOSIS — E119 Type 2 diabetes mellitus without complications: Secondary | ICD-10-CM | POA: Diagnosis not present

## 2017-06-08 DIAGNOSIS — M545 Low back pain: Secondary | ICD-10-CM | POA: Diagnosis not present

## 2017-06-08 DIAGNOSIS — I471 Supraventricular tachycardia: Secondary | ICD-10-CM | POA: Diagnosis not present

## 2017-06-08 DIAGNOSIS — F320 Major depressive disorder, single episode, mild: Secondary | ICD-10-CM | POA: Diagnosis not present

## 2017-06-08 DIAGNOSIS — R51 Headache: Secondary | ICD-10-CM | POA: Diagnosis not present

## 2017-06-08 DIAGNOSIS — J410 Simple chronic bronchitis: Secondary | ICD-10-CM | POA: Diagnosis not present

## 2017-07-05 DIAGNOSIS — M545 Low back pain: Secondary | ICD-10-CM | POA: Diagnosis not present

## 2017-07-05 DIAGNOSIS — G894 Chronic pain syndrome: Secondary | ICD-10-CM | POA: Diagnosis not present

## 2017-07-05 DIAGNOSIS — F321 Major depressive disorder, single episode, moderate: Secondary | ICD-10-CM | POA: Diagnosis not present

## 2017-07-09 DIAGNOSIS — G894 Chronic pain syndrome: Secondary | ICD-10-CM | POA: Diagnosis not present

## 2017-07-09 DIAGNOSIS — J410 Simple chronic bronchitis: Secondary | ICD-10-CM | POA: Diagnosis not present

## 2017-07-13 DIAGNOSIS — Z86010 Personal history of colonic polyps: Secondary | ICD-10-CM | POA: Diagnosis not present

## 2017-07-14 DIAGNOSIS — K5903 Drug induced constipation: Secondary | ICD-10-CM | POA: Diagnosis not present

## 2017-07-14 DIAGNOSIS — M545 Low back pain: Secondary | ICD-10-CM | POA: Diagnosis not present

## 2017-07-14 DIAGNOSIS — J410 Simple chronic bronchitis: Secondary | ICD-10-CM | POA: Diagnosis not present

## 2017-07-14 DIAGNOSIS — G894 Chronic pain syndrome: Secondary | ICD-10-CM | POA: Diagnosis not present

## 2017-07-20 DIAGNOSIS — R51 Headache: Secondary | ICD-10-CM | POA: Diagnosis not present

## 2017-07-20 DIAGNOSIS — R0602 Shortness of breath: Secondary | ICD-10-CM | POA: Diagnosis not present

## 2017-07-20 DIAGNOSIS — Z885 Allergy status to narcotic agent status: Secondary | ICD-10-CM | POA: Diagnosis not present

## 2017-07-20 DIAGNOSIS — F419 Anxiety disorder, unspecified: Secondary | ICD-10-CM | POA: Diagnosis not present

## 2017-07-20 DIAGNOSIS — D1809 Hemangioma of other sites: Secondary | ICD-10-CM | POA: Diagnosis not present

## 2017-07-20 DIAGNOSIS — R6883 Chills (without fever): Secondary | ICD-10-CM | POA: Diagnosis not present

## 2017-07-20 DIAGNOSIS — R109 Unspecified abdominal pain: Secondary | ICD-10-CM | POA: Diagnosis not present

## 2017-07-20 DIAGNOSIS — K5730 Diverticulosis of large intestine without perforation or abscess without bleeding: Secondary | ICD-10-CM | POA: Diagnosis not present

## 2017-07-20 DIAGNOSIS — R1084 Generalized abdominal pain: Secondary | ICD-10-CM | POA: Diagnosis not present

## 2017-07-20 DIAGNOSIS — J449 Chronic obstructive pulmonary disease, unspecified: Secondary | ICD-10-CM | POA: Diagnosis not present

## 2017-07-22 DIAGNOSIS — R112 Nausea with vomiting, unspecified: Secondary | ICD-10-CM | POA: Diagnosis not present

## 2017-07-22 DIAGNOSIS — R197 Diarrhea, unspecified: Secondary | ICD-10-CM | POA: Diagnosis not present

## 2017-07-22 DIAGNOSIS — R1084 Generalized abdominal pain: Secondary | ICD-10-CM | POA: Diagnosis not present

## 2017-08-11 DIAGNOSIS — I10 Essential (primary) hypertension: Secondary | ICD-10-CM | POA: Diagnosis not present

## 2017-09-10 DIAGNOSIS — N3000 Acute cystitis without hematuria: Secondary | ICD-10-CM | POA: Diagnosis not present

## 2017-09-10 DIAGNOSIS — E119 Type 2 diabetes mellitus without complications: Secondary | ICD-10-CM | POA: Diagnosis not present

## 2017-09-10 DIAGNOSIS — I952 Hypotension due to drugs: Secondary | ICD-10-CM | POA: Diagnosis not present

## 2017-09-10 DIAGNOSIS — E782 Mixed hyperlipidemia: Secondary | ICD-10-CM | POA: Diagnosis not present

## 2017-09-10 DIAGNOSIS — M546 Pain in thoracic spine: Secondary | ICD-10-CM | POA: Diagnosis not present

## 2017-10-14 DIAGNOSIS — M545 Low back pain: Secondary | ICD-10-CM | POA: Diagnosis not present

## 2017-10-14 DIAGNOSIS — M50322 Other cervical disc degeneration at C5-C6 level: Secondary | ICD-10-CM | POA: Diagnosis not present

## 2017-10-14 DIAGNOSIS — Z981 Arthrodesis status: Secondary | ICD-10-CM | POA: Diagnosis not present

## 2017-10-14 DIAGNOSIS — M898X1 Other specified disorders of bone, shoulder: Secondary | ICD-10-CM | POA: Diagnosis not present

## 2017-10-14 DIAGNOSIS — Z9889 Other specified postprocedural states: Secondary | ICD-10-CM | POA: Diagnosis not present

## 2017-10-14 DIAGNOSIS — R200 Anesthesia of skin: Secondary | ICD-10-CM | POA: Diagnosis not present

## 2017-11-09 DIAGNOSIS — R358 Other polyuria: Secondary | ICD-10-CM | POA: Diagnosis not present

## 2017-11-09 DIAGNOSIS — R3911 Hesitancy of micturition: Secondary | ICD-10-CM | POA: Diagnosis not present

## 2017-11-09 DIAGNOSIS — M545 Low back pain: Secondary | ICD-10-CM | POA: Diagnosis not present

## 2017-11-09 DIAGNOSIS — N3000 Acute cystitis without hematuria: Secondary | ICD-10-CM | POA: Diagnosis not present

## 2017-11-22 DIAGNOSIS — M810 Age-related osteoporosis without current pathological fracture: Secondary | ICD-10-CM | POA: Diagnosis not present

## 2017-11-23 DIAGNOSIS — R350 Frequency of micturition: Secondary | ICD-10-CM | POA: Diagnosis not present

## 2017-12-16 DIAGNOSIS — E782 Mixed hyperlipidemia: Secondary | ICD-10-CM | POA: Diagnosis not present

## 2017-12-16 DIAGNOSIS — Z23 Encounter for immunization: Secondary | ICD-10-CM | POA: Diagnosis not present

## 2017-12-16 DIAGNOSIS — M546 Pain in thoracic spine: Secondary | ICD-10-CM | POA: Diagnosis not present

## 2017-12-16 DIAGNOSIS — E119 Type 2 diabetes mellitus without complications: Secondary | ICD-10-CM | POA: Diagnosis not present

## 2017-12-16 DIAGNOSIS — J410 Simple chronic bronchitis: Secondary | ICD-10-CM | POA: Diagnosis not present

## 2018-01-04 DIAGNOSIS — R233 Spontaneous ecchymoses: Secondary | ICD-10-CM | POA: Diagnosis not present

## 2018-01-04 DIAGNOSIS — R600 Localized edema: Secondary | ICD-10-CM | POA: Diagnosis not present

## 2018-01-14 DIAGNOSIS — Z86711 Personal history of pulmonary embolism: Secondary | ICD-10-CM | POA: Diagnosis not present

## 2018-01-14 DIAGNOSIS — R0602 Shortness of breath: Secondary | ICD-10-CM | POA: Diagnosis not present

## 2018-01-14 DIAGNOSIS — Z86718 Personal history of other venous thrombosis and embolism: Secondary | ICD-10-CM | POA: Diagnosis not present

## 2018-01-14 DIAGNOSIS — Z8679 Personal history of other diseases of the circulatory system: Secondary | ICD-10-CM | POA: Diagnosis not present

## 2018-01-14 DIAGNOSIS — R011 Cardiac murmur, unspecified: Secondary | ICD-10-CM | POA: Diagnosis not present

## 2018-01-14 DIAGNOSIS — Z7901 Long term (current) use of anticoagulants: Secondary | ICD-10-CM | POA: Diagnosis not present

## 2018-01-17 DIAGNOSIS — R0602 Shortness of breath: Secondary | ICD-10-CM | POA: Diagnosis not present

## 2018-02-08 DIAGNOSIS — H40013 Open angle with borderline findings, low risk, bilateral: Secondary | ICD-10-CM | POA: Diagnosis not present

## 2018-02-16 DIAGNOSIS — R9431 Abnormal electrocardiogram [ECG] [EKG]: Secondary | ICD-10-CM | POA: Diagnosis not present

## 2018-02-16 DIAGNOSIS — R079 Chest pain, unspecified: Secondary | ICD-10-CM | POA: Diagnosis not present

## 2018-02-16 DIAGNOSIS — K5730 Diverticulosis of large intestine without perforation or abscess without bleeding: Secondary | ICD-10-CM | POA: Diagnosis not present

## 2018-02-16 DIAGNOSIS — R0789 Other chest pain: Secondary | ICD-10-CM | POA: Diagnosis not present

## 2018-02-16 DIAGNOSIS — R61 Generalized hyperhidrosis: Secondary | ICD-10-CM | POA: Diagnosis not present

## 2018-02-16 DIAGNOSIS — R1032 Left lower quadrant pain: Secondary | ICD-10-CM | POA: Diagnosis not present

## 2018-02-16 DIAGNOSIS — R112 Nausea with vomiting, unspecified: Secondary | ICD-10-CM | POA: Diagnosis not present

## 2018-02-16 DIAGNOSIS — I10 Essential (primary) hypertension: Secondary | ICD-10-CM | POA: Diagnosis not present

## 2018-02-16 DIAGNOSIS — R002 Palpitations: Secondary | ICD-10-CM | POA: Diagnosis not present

## 2018-02-16 DIAGNOSIS — E876 Hypokalemia: Secondary | ICD-10-CM | POA: Diagnosis not present

## 2018-02-16 DIAGNOSIS — R0600 Dyspnea, unspecified: Secondary | ICD-10-CM | POA: Diagnosis not present

## 2018-02-16 DIAGNOSIS — R0902 Hypoxemia: Secondary | ICD-10-CM | POA: Diagnosis not present

## 2018-02-16 DIAGNOSIS — R197 Diarrhea, unspecified: Secondary | ICD-10-CM | POA: Diagnosis not present

## 2018-02-16 DIAGNOSIS — R1084 Generalized abdominal pain: Secondary | ICD-10-CM | POA: Diagnosis not present

## 2018-02-17 DIAGNOSIS — R1032 Left lower quadrant pain: Secondary | ICD-10-CM | POA: Diagnosis not present

## 2018-02-17 DIAGNOSIS — I5022 Chronic systolic (congestive) heart failure: Secondary | ICD-10-CM | POA: Diagnosis not present

## 2018-02-17 DIAGNOSIS — J09X2 Influenza due to identified novel influenza A virus with other respiratory manifestations: Secondary | ICD-10-CM | POA: Diagnosis not present

## 2018-02-21 ENCOUNTER — Other Ambulatory Visit: Payer: Self-pay

## 2018-03-02 DIAGNOSIS — J09X2 Influenza due to identified novel influenza A virus with other respiratory manifestations: Secondary | ICD-10-CM | POA: Diagnosis not present

## 2018-03-02 DIAGNOSIS — R0789 Other chest pain: Secondary | ICD-10-CM | POA: Diagnosis not present

## 2018-03-02 DIAGNOSIS — R002 Palpitations: Secondary | ICD-10-CM | POA: Diagnosis not present

## 2018-03-08 DIAGNOSIS — I471 Supraventricular tachycardia: Secondary | ICD-10-CM | POA: Diagnosis not present

## 2018-03-09 DIAGNOSIS — Z0001 Encounter for general adult medical examination with abnormal findings: Secondary | ICD-10-CM | POA: Diagnosis not present

## 2018-03-09 DIAGNOSIS — Z1211 Encounter for screening for malignant neoplasm of colon: Secondary | ICD-10-CM | POA: Diagnosis not present

## 2018-03-09 DIAGNOSIS — Z6824 Body mass index (BMI) 24.0-24.9, adult: Secondary | ICD-10-CM | POA: Diagnosis not present

## 2018-03-09 DIAGNOSIS — M818 Other osteoporosis without current pathological fracture: Secondary | ICD-10-CM | POA: Diagnosis not present

## 2018-03-09 DIAGNOSIS — Z1231 Encounter for screening mammogram for malignant neoplasm of breast: Secondary | ICD-10-CM | POA: Diagnosis not present

## 2018-03-14 DIAGNOSIS — J441 Chronic obstructive pulmonary disease with (acute) exacerbation: Secondary | ICD-10-CM | POA: Diagnosis not present

## 2018-03-14 DIAGNOSIS — M7918 Myalgia, other site: Secondary | ICD-10-CM | POA: Diagnosis not present

## 2018-03-21 DIAGNOSIS — E1169 Type 2 diabetes mellitus with other specified complication: Secondary | ICD-10-CM | POA: Diagnosis not present

## 2018-03-21 DIAGNOSIS — I471 Supraventricular tachycardia: Secondary | ICD-10-CM | POA: Diagnosis not present

## 2018-03-21 DIAGNOSIS — R42 Dizziness and giddiness: Secondary | ICD-10-CM | POA: Diagnosis not present

## 2018-03-21 DIAGNOSIS — E782 Mixed hyperlipidemia: Secondary | ICD-10-CM | POA: Diagnosis not present

## 2018-03-21 DIAGNOSIS — R3911 Hesitancy of micturition: Secondary | ICD-10-CM | POA: Diagnosis not present

## 2018-03-21 DIAGNOSIS — I739 Peripheral vascular disease, unspecified: Secondary | ICD-10-CM | POA: Diagnosis not present

## 2018-03-21 DIAGNOSIS — I10 Essential (primary) hypertension: Secondary | ICD-10-CM | POA: Diagnosis not present

## 2018-03-21 DIAGNOSIS — I2699 Other pulmonary embolism without acute cor pulmonale: Secondary | ICD-10-CM | POA: Diagnosis not present

## 2018-03-21 DIAGNOSIS — J410 Simple chronic bronchitis: Secondary | ICD-10-CM | POA: Diagnosis not present

## 2018-03-21 DIAGNOSIS — M546 Pain in thoracic spine: Secondary | ICD-10-CM | POA: Diagnosis not present

## 2018-04-22 DIAGNOSIS — M810 Age-related osteoporosis without current pathological fracture: Secondary | ICD-10-CM | POA: Diagnosis not present

## 2018-04-22 DIAGNOSIS — N959 Unspecified menopausal and perimenopausal disorder: Secondary | ICD-10-CM | POA: Diagnosis not present

## 2018-04-22 DIAGNOSIS — Z1231 Encounter for screening mammogram for malignant neoplasm of breast: Secondary | ICD-10-CM | POA: Diagnosis not present

## 2018-05-05 DIAGNOSIS — M545 Low back pain: Secondary | ICD-10-CM | POA: Diagnosis not present

## 2018-05-05 DIAGNOSIS — Z4789 Encounter for other orthopedic aftercare: Secondary | ICD-10-CM | POA: Diagnosis not present

## 2018-05-05 DIAGNOSIS — Z981 Arthrodesis status: Secondary | ICD-10-CM | POA: Diagnosis not present

## 2018-05-09 DIAGNOSIS — R921 Mammographic calcification found on diagnostic imaging of breast: Secondary | ICD-10-CM | POA: Diagnosis not present

## 2018-05-09 DIAGNOSIS — N6313 Unspecified lump in the right breast, lower outer quadrant: Secondary | ICD-10-CM | POA: Diagnosis not present

## 2018-05-09 DIAGNOSIS — N6311 Unspecified lump in the right breast, upper outer quadrant: Secondary | ICD-10-CM | POA: Diagnosis not present

## 2018-05-16 ENCOUNTER — Other Ambulatory Visit: Payer: Self-pay | Admitting: Family Medicine

## 2018-05-16 DIAGNOSIS — R928 Other abnormal and inconclusive findings on diagnostic imaging of breast: Secondary | ICD-10-CM

## 2018-05-23 ENCOUNTER — Ambulatory Visit
Admission: RE | Admit: 2018-05-23 | Discharge: 2018-05-23 | Disposition: A | Discharge status: Home / Self Care | Payer: Medicare Other | Source: Ambulatory Visit | Attending: Family Medicine | Admitting: Family Medicine

## 2018-05-23 DIAGNOSIS — R928 Other abnormal and inconclusive findings on diagnostic imaging of breast: Secondary | ICD-10-CM

## 2018-05-23 DIAGNOSIS — R921 Mammographic calcification found on diagnostic imaging of breast: Secondary | ICD-10-CM | POA: Diagnosis not present

## 2018-05-23 DIAGNOSIS — D241 Benign neoplasm of right breast: Secondary | ICD-10-CM | POA: Diagnosis not present

## 2018-05-26 DIAGNOSIS — M810 Age-related osteoporosis without current pathological fracture: Secondary | ICD-10-CM | POA: Diagnosis not present

## 2018-05-27 DIAGNOSIS — Z23 Encounter for immunization: Secondary | ICD-10-CM | POA: Diagnosis not present

## 2018-06-23 DIAGNOSIS — E1169 Type 2 diabetes mellitus with other specified complication: Secondary | ICD-10-CM | POA: Diagnosis not present

## 2018-06-23 DIAGNOSIS — E782 Mixed hyperlipidemia: Secondary | ICD-10-CM | POA: Diagnosis not present

## 2018-06-23 DIAGNOSIS — G894 Chronic pain syndrome: Secondary | ICD-10-CM | POA: Diagnosis not present

## 2018-11-22 DIAGNOSIS — L219 Seborrheic dermatitis, unspecified: Secondary | ICD-10-CM | POA: Diagnosis not present

## 2018-11-22 DIAGNOSIS — C44329 Squamous cell carcinoma of skin of other parts of face: Secondary | ICD-10-CM | POA: Diagnosis not present

## 2018-11-22 DIAGNOSIS — L853 Xerosis cutis: Secondary | ICD-10-CM | POA: Diagnosis not present

## 2018-11-25 DIAGNOSIS — M810 Age-related osteoporosis without current pathological fracture: Secondary | ICD-10-CM | POA: Diagnosis not present

## 2018-12-19 DIAGNOSIS — E1169 Type 2 diabetes mellitus with other specified complication: Secondary | ICD-10-CM | POA: Diagnosis not present

## 2018-12-19 DIAGNOSIS — E782 Mixed hyperlipidemia: Secondary | ICD-10-CM | POA: Diagnosis not present

## 2019-01-09 DIAGNOSIS — H40013 Open angle with borderline findings, low risk, bilateral: Secondary | ICD-10-CM | POA: Diagnosis not present

## 2019-01-18 DIAGNOSIS — I950 Idiopathic hypotension: Secondary | ICD-10-CM | POA: Diagnosis not present

## 2019-01-18 DIAGNOSIS — M545 Low back pain: Secondary | ICD-10-CM | POA: Diagnosis not present

## 2019-01-18 DIAGNOSIS — F331 Major depressive disorder, recurrent, moderate: Secondary | ICD-10-CM | POA: Diagnosis not present

## 2019-01-18 DIAGNOSIS — R002 Palpitations: Secondary | ICD-10-CM | POA: Diagnosis not present

## 2019-02-20 DIAGNOSIS — I950 Idiopathic hypotension: Secondary | ICD-10-CM | POA: Diagnosis not present

## 2019-02-20 DIAGNOSIS — M25511 Pain in right shoulder: Secondary | ICD-10-CM | POA: Diagnosis not present

## 2019-02-20 DIAGNOSIS — F331 Major depressive disorder, recurrent, moderate: Secondary | ICD-10-CM | POA: Diagnosis not present

## 2019-02-20 DIAGNOSIS — M545 Low back pain: Secondary | ICD-10-CM | POA: Diagnosis not present

## 2019-03-08 DIAGNOSIS — F330 Major depressive disorder, recurrent, mild: Secondary | ICD-10-CM | POA: Diagnosis not present

## 2019-03-08 DIAGNOSIS — M545 Low back pain: Secondary | ICD-10-CM | POA: Diagnosis not present

## 2019-03-14 DIAGNOSIS — Z0000 Encounter for general adult medical examination without abnormal findings: Secondary | ICD-10-CM | POA: Diagnosis not present

## 2019-04-07 HISTORY — PX: CORONARY ANGIOPLASTY WITH STENT PLACEMENT: SHX49

## 2019-04-19 DIAGNOSIS — I471 Supraventricular tachycardia: Secondary | ICD-10-CM | POA: Diagnosis not present

## 2019-04-19 DIAGNOSIS — E7849 Other hyperlipidemia: Secondary | ICD-10-CM | POA: Diagnosis not present

## 2019-04-19 DIAGNOSIS — I2609 Other pulmonary embolism with acute cor pulmonale: Secondary | ICD-10-CM | POA: Diagnosis not present

## 2019-04-19 DIAGNOSIS — G459 Transient cerebral ischemic attack, unspecified: Secondary | ICD-10-CM | POA: Diagnosis not present

## 2019-04-19 DIAGNOSIS — I2782 Chronic pulmonary embolism: Secondary | ICD-10-CM | POA: Diagnosis not present

## 2019-05-22 ENCOUNTER — Other Ambulatory Visit: Payer: Self-pay

## 2019-05-22 ENCOUNTER — Telehealth: Payer: Self-pay | Admitting: Family Medicine

## 2019-05-22 MED ORDER — MORPHINE SULFATE ER 30 MG PO TBCR: 30.0000 mg | EXTENDED_RELEASE_TABLET | Freq: Two times a day (BID) | ORAL | 0 refills | Status: DC

## 2019-05-22 NOTE — Telephone Encounter (Signed)
Patient called wanting a refill on Morphine.  Please send to [facility], [place]. Thanks, Damon

## 2019-05-22 NOTE — Telephone Encounter (Signed)
Refill request has been submitted to Dr. Tobie Poet

## 2019-06-19 NOTE — Progress Notes (Signed)
Established Patient Office Visit  Subjective:  Patient ID: Donna Little, female    DOB: 1946/03/21  Age: 73 y.o. MRN: [id number]  CC: trigger finger pain  HPI Donna Little presents for pain in her right fourth finger. Requesting a kenalog injection.   Past Medical History:  Diagnosis Date  . A-fib ([place])   . Chronic pain syndrome   . Chronic systolic heart failure ([place])   . Depression   . Generalized atherosclerosis   . GERD (gastroesophageal reflux disease)   . Hypertension   . IBS (irritable bowel syndrome)   . Melanoma ([place])   . Other specified nontoxic goiter   . Palpitations   . Polymyalgia rheumatica ([place])   . Primary pulmonary hypertension ([place])   . Pulmonary embolism ([place])   . Stroke ([place])    ministroke  . Supraventricular tachycardia ([place])   . Type 2 diabetes mellitus ([facility])       Family History  Problem Relation Age of Onset  . Diabetes Other   . Stroke Other   . Heart disease Other     Social History   Socioeconomic History  . Marital status: Single    Spouse name: Not on file  . Number of children: 2  . Years of education: Not on file  . Highest education level: Not on file  Occupational History  . Occupation: retired  Tobacco Use  . Smoking status: Former Research scientist (life sciences)  . Smokeless tobacco: Never Used  Substance and Sexual Activity  . Alcohol use: No    Alcohol/week: 0.0 standard drinks  . Drug use: No  . Sexual activity: Not on file  Other Topics Concern  . Not on file  Social History Narrative  . Not on file   Social Determinants of Health   Financial Resource Strain:   . Difficulty of Paying Living Expenses:   Food Insecurity:   . Worried About Charity fundraiser in the Last Year:   . Arboriculturist in the Last Year:   Transportation Needs:   . Film/video editor (Medical):   Marland Kitchen Lack of Transportation (Non-Medical):   Physical Activity:   . Days of Exercise per Week:   . Minutes of Exercise per Session:   Stress:   .  Feeling of Stress :   Social Connections:   . Frequency of Communication with Friends and Family:   . Frequency of Social Gatherings with Friends and Family:   . Attends Religious Services:   . Active Member of Clubs or Organizations:   . Attends Archivist Meetings:   Marland Kitchen Marital Status:   Intimate Partner Violence:   . Fear of Current or Ex-Partner:   . Emotionally Abused:   Marland Kitchen Physically Abused:   . Sexually Abused:     Outpatient Medications Prior to Visit  Medication Sig Dispense Refill  . albuterol (VENTOLIN HFA) 108 (90 Base) MCG/ACT inhaler Inhale into the lungs.    . citalopram (CELEXA) 20 MG tablet Take 20 mg by mouth daily.    . clopidogrel (PLAVIX) 75 MG tablet Take 75 mg by mouth daily.    . Fluticasone-Umeclidin-Vilant (TRELEGY ELLIPTA) 100-62.5-25 MCG/INH AEPB INHALE 1 PUFF ONCE DAILY USE AT THE SAME TIME EACH DAY    . furosemide (LASIX) 20 MG tablet Take 40 mg by mouth 2 (two) times daily.     Marland Kitchen gabapentin (NEURONTIN) 100 MG capsule Take 3 capsules (300 mg total) by mouth 2 (two) times daily. 540 capsule 0  .  metoprolol succinate (TOPROL-XL) 25 MG 24 hr tablet Take 25 mg by mouth 3 (three) times daily.    . pantoprazole (PROTONIX) 40 MG tablet Take 40 mg by mouth daily.    . pravastatin (PRAVACHOL) 20 MG tablet Take 40 mg by mouth daily.     Marland Kitchen morphine (MS CONTIN) 30 MG 12 hr tablet Take 1 tablet (30 mg total) by mouth every 12 (twelve) hours. 60 tablet 0  . DENOSUMAB [place] Inject into the skin.    Marland Kitchen meclizine (ANTIVERT) 25 MG tablet Take by mouth.    . nitroGLYCERIN (NITROSTAT) 0.4 MG SL tablet Place under the tongue.    . ondansetron (ZOFRAN-ODT) 4 MG disintegrating tablet     . Potassium Chloride ER 20 MEQ TBCR     . dicyclomine (BENTYL) 10 MG capsule Take 10 mg by mouth 4 (four) times daily -  before meals and at bedtime.    Marland Kitchen omega-3 acid ethyl esters (LOVAZA) 1 G capsule Take by mouth daily.    . predniSONE (DELTASONE) 1 MG tablet Take 1 mg by mouth daily  with breakfast.     No facility-administered medications prior to visit.    Allergies  Allergen Reactions  . Codeine   . Methocarbamol   . Sertraline     Insomnia   . Tizanidine     ROS Review of Systems  Constitutional: Positive for fatigue. Negative for chills and fever.       Baseline  HENT: Negative for congestion, ear pain, rhinorrhea and sore throat.   Respiratory: Negative for cough and shortness of breath.   Cardiovascular: Negative for chest pain.  Genitourinary: Negative for urgency.  Musculoskeletal: Positive for back pain. Negative for myalgias.       Chronic.  Psychiatric/Behavioral: Negative for dysphoric mood. The patient is not nervous/anxious.       Objective:    Physical Exam  Constitutional: She appears well-developed and well-nourished.  Musculoskeletal:     Comments: Rt fourth digit triggers at first PIP. Painful and requires manual release with other hand.  Neurological: She is alert.  Psychiatric: She has a normal mood and affect. Her behavior is normal.    BP 112/62 (BP Location: Right Arm, Patient Position: Sitting)   Pulse 65   Temp (!) 96.5 F (35.8 C) (Temporal)   Ht 5\' 3"  (1.6 m)   Wt 140 lb (63.5 kg)   SpO2 95%   BMI 24.80 kg/m  Wt Readings from Last 3 Encounters:  06/20/19 140 lb (63.5 kg)  01/16/15 161 lb 14.4 oz (73.4 kg)    Assessment & Plan:  1. Trigger finger, right ring finger Risk and benefit discussed with patient including increase in sugar, bleeding or infection. Injected in volar surface of hand at distal crease proximal to 4th digit.  - triamcinolone acetonide (KENALOG-40) injection 20 mg/0.5 cc lidocaine.  Meds ordered this encounter  Medications  . triamcinolone acetonide (KENALOG-40) injection 20 mg    Follow-up: Return if symptoms worsen or fail to improve, for repeat injection with Dr. Henrene Pastor if it has not helped at all or fully in 1-2 weeks.[name], MD

## 2019-06-20 ENCOUNTER — Ambulatory Visit (INDEPENDENT_AMBULATORY_CARE_PROVIDER_SITE_OTHER): Payer: Medicare Other | Admitting: Family Medicine

## 2019-06-20 ENCOUNTER — Encounter: Payer: Self-pay | Admitting: Family Medicine

## 2019-06-20 ENCOUNTER — Other Ambulatory Visit: Payer: Self-pay

## 2019-06-20 VITALS — BP 112/62 | HR 65 | Temp 96.5°F | Ht 63.0 in | Wt 140.0 lb

## 2019-06-20 DIAGNOSIS — M65341 Trigger finger, right ring finger: Secondary | ICD-10-CM

## 2019-06-20 MED ORDER — TRIAMCINOLONE ACETONIDE 40 MG/ML IJ SUSP
20.0000 mg | Freq: Once | INTRAMUSCULAR | Status: AC
  Administered 2019-06-20: 20 mg via INTRA_ARTICULAR

## 2019-06-22 ENCOUNTER — Other Ambulatory Visit: Payer: Self-pay

## 2019-06-22 MED ORDER — MORPHINE SULFATE ER 30 MG PO TBCR: 30.0000 mg | EXTENDED_RELEASE_TABLET | Freq: Two times a day (BID) | ORAL | 0 refills | Status: DC

## 2019-06-26 ENCOUNTER — Encounter: Payer: Self-pay | Admitting: Family Medicine

## 2019-06-26 DIAGNOSIS — M65341 Trigger finger, right ring finger: Secondary | ICD-10-CM | POA: Insufficient documentation

## 2019-07-21 ENCOUNTER — Telehealth: Payer: Self-pay | Admitting: Family Medicine

## 2019-07-21 ENCOUNTER — Other Ambulatory Visit: Payer: Self-pay

## 2019-07-21 NOTE — Progress Notes (Signed)
  Chronic Care Management   Note  07/21/2019 Name: Donna Little MRN: [id number] DOB: 06/22/1945  Donna Little is a 74 y.o. year old female who is a primary care patient of Cox, Kirsten, MD. I reached out to [name] by phone today in response to a referral sent by Donna Little PCP, Cox, Kirsten, MD.   Donna Little was given information about Chronic Care Management services today including:  1. CCM service includes personalized support from designated clinical staff supervised by her physician, including individualized plan of care and coordination with other care providers 2. 24/7 contact phone numbers for assistance for urgent and routine care needs. 3. Service will only be billed when office clinical staff spend 20 minutes or more in a month to coordinate care. 4. Only one practitioner may furnish and bill the service in a calendar month. 5. The patient may stop CCM services at any time (effective at the end of the month) by phone call to the office staff.   Patient agreed to services and verbal consent obtained.   Follow up plan:   Donna Little Upstream Scheduler

## 2019-07-22 MED ORDER — MORPHINE SULFATE ER 30 MG PO TBCR: 30.0000 mg | EXTENDED_RELEASE_TABLET | Freq: Two times a day (BID) | ORAL | 0 refills | Status: DC

## 2019-07-24 NOTE — Progress Notes (Signed)
No showed. Kc  

## 2019-07-25 ENCOUNTER — Encounter: Payer: Self-pay | Admitting: Family Medicine

## 2019-07-25 ENCOUNTER — Ambulatory Visit (INDEPENDENT_AMBULATORY_CARE_PROVIDER_SITE_OTHER): Payer: Medicare Other | Admitting: Family Medicine

## 2019-07-25 DIAGNOSIS — I10 Essential (primary) hypertension: Secondary | ICD-10-CM

## 2019-07-25 DIAGNOSIS — Z1231 Encounter for screening mammogram for malignant neoplasm of breast: Secondary | ICD-10-CM | POA: Diagnosis not present

## 2019-07-25 DIAGNOSIS — E782 Mixed hyperlipidemia: Secondary | ICD-10-CM

## 2019-07-25 DIAGNOSIS — E119 Type 2 diabetes mellitus without complications: Secondary | ICD-10-CM

## 2019-07-25 LAB — HM MAMMOGRAPHY

## 2019-08-08 ENCOUNTER — Other Ambulatory Visit: Payer: Self-pay | Admitting: Family Medicine

## 2019-08-14 NOTE — Chronic Care Management (AMB) (Deleted)
Chronic Care Management Pharmacy  Name: Donna Little  MRN: [id number] DOB: 1945/11/14   Chief Complaint/ HPI  Donna Little,  74 y.o. , female presents for their Initial CCM visit with the clinical pharmacist via telephone due to COVID-19 Pandemic.  PCP : [name], MD  Their chronic conditions include: HTN, Pulmonary embolism, Cerebrovascular disease, HLD, Primary Pulmonary HTN, GERD, Depression, IBS.   Office Visits:***  Consult Visit:***  Medications: Outpatient Encounter Medications as of 08/15/2019  Medication Sig  . albuterol (VENTOLIN HFA) 108 (90 Base) MCG/ACT inhaler Inhale into the lungs.  . citalopram (CELEXA) 20 MG tablet Take 20 mg by mouth daily.  . clopidogrel (PLAVIX) 75 MG tablet Take 75 mg by mouth daily.  . DENOSUMAB [place] Inject into the skin.  Marland Kitchen Fluticasone-Umeclidin-Vilant (TRELEGY ELLIPTA) 100-62.5-25 MCG/INH AEPB INHALE 1 PUFF ONCE DAILY USE AT THE SAME TIME EACH DAY  . furosemide (LASIX) 20 MG tablet Take 40 mg by mouth 2 (two) times daily.   Marland Kitchen gabapentin (NEURONTIN) 100 MG capsule Take 3 capsules (300 mg total) by mouth 2 (two) times daily.  . meclizine (ANTIVERT) 25 MG tablet Take by mouth.  . metoprolol succinate (TOPROL-XL) 25 MG 24 hr tablet Take 25 mg by mouth 3 (three) times daily.  Marland Kitchen morphine (MS CONTIN) 30 MG 12 hr tablet Take 1 tablet (30 mg total) by mouth every 12 (twelve) hours.  . nitroGLYCERIN (NITROSTAT) 0.4 MG SL tablet Place under the tongue.  . ondansetron (ZOFRAN-ODT) 4 MG disintegrating tablet   . pantoprazole (PROTONIX) 40 MG tablet Take 40 mg by mouth daily.  . Potassium Chloride ER 20 MEQ TBCR Take 1 tablet by mouth twice daily with food  . pravastatin (PRAVACHOL) 20 MG tablet Take 1 tablet by mouth once daily   No facility-administered encounter medications on file as of 08/15/2019.     Current Diagnosis/Assessment:  Goals Addressed   None     COPD / Asthma / Tobacco   Last spirometry score: ***  Gold Grade: {CHL  HP Upstream Pharm COPD Gold [id number] Current COPD Classification:  {CHL HP Upstream Pharm COPD Classification:[phone]}  Eosinophil count:  No results found for: EOSPCT%                               Eos (Absolute): No results found for: EOSABS  Tobacco Status:  Social History   Tobacco Use  Smoking Status Former Smoker  Smokeless Tobacco Never Used    Patient has failed these meds in past: *** Patient is currently {CHL Controlled/[phone]} on the following medications: *** Using maintenance inhaler regularly? {yes/no:20286} Frequency of rescue inhaler use:  {CHL HP Upstream Pharm Inhaler [id number]  We discussed:  {CHL HP Upstream Pharmacy [phone]}  Plan  Continue {CHL HP Upstream Pharmacy Plans:[phone]} ,  Hypertension   BP today is:  {CHL HP UPSTREAM Pharmacist BP ranges:[phone]}  Office blood pressures are  BP Readings from Last 3 Encounters:  06/20/19 112/62  01/16/15 130/68    Patient has failed these meds in the past: ***  Patient checks BP at home {CHL HP BP Monitoring Frequency:[phone]}  Patient home BP readings are ranging: ***  We discussed {CHL HP Upstream Pharmacy [phone]}  Plan  Continue {CHL HP Upstream Pharmacy Plans:[phone]}     and  Other Diagnosis:Depression    Patient has failed these meds in past: *** Patient is currently {CHL Controlled/[phone]} on the following medications: ***  We discussed:  {  CHL HP Upstream Pharmacy [phone]}  Plan  Continue {CHL HP Upstream Pharmacy [id number]

## 2019-08-15 ENCOUNTER — Other Ambulatory Visit: Payer: Self-pay

## 2019-08-15 ENCOUNTER — Encounter: Payer: Self-pay | Admitting: Family Medicine

## 2019-08-15 ENCOUNTER — Ambulatory Visit (INDEPENDENT_AMBULATORY_CARE_PROVIDER_SITE_OTHER): Payer: Medicare Other | Admitting: Family Medicine

## 2019-08-15 ENCOUNTER — Telehealth: Payer: Medicare Other

## 2019-08-15 VITALS — BP 108/52 | HR 64 | Temp 96.2°F | Resp 16 | Ht 63.0 in | Wt 140.0 lb

## 2019-08-15 DIAGNOSIS — M549 Dorsalgia, unspecified: Secondary | ICD-10-CM | POA: Diagnosis not present

## 2019-08-15 DIAGNOSIS — M5430 Sciatica, unspecified side: Secondary | ICD-10-CM | POA: Diagnosis not present

## 2019-08-15 DIAGNOSIS — R32 Unspecified urinary incontinence: Secondary | ICD-10-CM

## 2019-08-15 MED ORDER — GABAPENTIN 300 MG PO CAPS: 300.0000 mg | ORAL_CAPSULE | Freq: Three times a day (TID) | ORAL | 0 refills | Status: DC

## 2019-08-15 MED ORDER — MORPHINE SULFATE ER 30 MG PO TBCR: 30.0000 mg | EXTENDED_RELEASE_TABLET | Freq: Three times a day (TID) | ORAL | 0 refills | Status: DC

## 2019-08-15 MED ORDER — PREDNISONE 50 MG PO TABS: 50.0000 mg | ORAL_TABLET | Freq: Every day | ORAL | 0 refills | Status: DC

## 2019-08-15 NOTE — Patient Instructions (Signed)
Increase gabapentin to 300 mg 3 times a day. Increase MS Contin to 30 milligrams 3 times a day. Course of prednisone 50 mg once daily for 5 days. Get lumbar x-ray. Follow-up in 1 month.  Call sooner if pain is intolerable. Order a manual wheelchair.
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
## 2019-08-16 ENCOUNTER — Encounter: Payer: Self-pay | Admitting: Family Medicine

## 2019-08-16 DIAGNOSIS — M4854XA Collapsed vertebra, not elsewhere classified, thoracic region, initial encounter for fracture: Secondary | ICD-10-CM | POA: Diagnosis not present

## 2019-08-16 DIAGNOSIS — I700 Atherosclerosis of aorta: Secondary | ICD-10-CM | POA: Diagnosis not present

## 2019-08-16 DIAGNOSIS — M545 Low back pain: Secondary | ICD-10-CM | POA: Diagnosis not present

## 2019-08-21 ENCOUNTER — Telehealth: Payer: Self-pay

## 2019-08-21 DIAGNOSIS — R32 Unspecified urinary incontinence: Secondary | ICD-10-CM | POA: Insufficient documentation

## 2019-08-21 DIAGNOSIS — M549 Dorsalgia, unspecified: Secondary | ICD-10-CM | POA: Insufficient documentation

## 2019-08-21 DIAGNOSIS — M5430 Sciatica, unspecified side: Secondary | ICD-10-CM | POA: Insufficient documentation

## 2019-08-21 DIAGNOSIS — S32020A Wedge compression fracture of second lumbar vertebra, initial encounter for closed fracture: Secondary | ICD-10-CM | POA: Diagnosis not present

## 2019-08-21 DIAGNOSIS — Z981 Arthrodesis status: Secondary | ICD-10-CM | POA: Diagnosis not present

## 2019-08-21 DIAGNOSIS — S32040A Wedge compression fracture of fourth lumbar vertebra, initial encounter for closed fracture: Secondary | ICD-10-CM | POA: Diagnosis not present

## 2019-08-21 DIAGNOSIS — M8448XA Pathological fracture, other site, initial encounter for fracture: Secondary | ICD-10-CM | POA: Diagnosis not present

## 2019-08-21 DIAGNOSIS — M4856XA Collapsed vertebra, not elsewhere classified, lumbar region, initial encounter for fracture: Secondary | ICD-10-CM | POA: Diagnosis not present

## 2019-08-21 DIAGNOSIS — M48061 Spinal stenosis, lumbar region without neurogenic claudication: Secondary | ICD-10-CM | POA: Diagnosis not present

## 2019-08-21 MED ORDER — GABAPENTIN 800 MG PO TABS: 800.0000 mg | ORAL_TABLET | Freq: Three times a day (TID) | ORAL | 3 refills | Status: DC

## 2019-08-21 MED ORDER — MORPHINE SULFATE ER 30 MG PO TBCR: 30.0000 mg | EXTENDED_RELEASE_TABLET | Freq: Three times a day (TID) | ORAL | 0 refills | Status: DC

## 2019-08-21 NOTE — Telephone Encounter (Signed)
Donna Little called to report that he symptoms have worsened. Donna Little is having increased pain in the low back and loss of bladder control.  Dr. Tobie Poet advised that Donna Little go immediately to the ED since Donna Little needs an MRI but patient refused.  Donna Little wants to be scheduled for an outpatient MRI.  Dr. Tobie Poet agreed to try to get a STAT MRI of the lumbar spine w/contrast.

## 2019-08-22 ENCOUNTER — Other Ambulatory Visit: Payer: Self-pay

## 2019-08-22 DIAGNOSIS — M5430 Sciatica, unspecified side: Secondary | ICD-10-CM

## 2019-08-22 DIAGNOSIS — M5416 Radiculopathy, lumbar region: Secondary | ICD-10-CM

## 2019-08-22 DIAGNOSIS — M549 Dorsalgia, unspecified: Secondary | ICD-10-CM

## 2019-08-22 DIAGNOSIS — R32 Unspecified urinary incontinence: Secondary | ICD-10-CM

## 2019-08-24 ENCOUNTER — Other Ambulatory Visit: Payer: Self-pay

## 2019-08-24 DIAGNOSIS — I10 Essential (primary) hypertension: Secondary | ICD-10-CM

## 2019-08-24 DIAGNOSIS — E782 Mixed hyperlipidemia: Secondary | ICD-10-CM

## 2019-08-24 DIAGNOSIS — E119 Type 2 diabetes mellitus without complications: Secondary | ICD-10-CM

## 2019-08-24 NOTE — Progress Notes (Signed)
Appt on 08/15/2019 with Sara Beth Winton, PharmD. 

## 2019-08-25 ENCOUNTER — Telehealth: Payer: Self-pay | Admitting: Family Medicine

## 2019-08-25 NOTE — Chronic Care Management (AMB) (Signed)
  Chronic Care Management   Note  08/25/2019 Name: Donna Little MRN: [id number] DOB: Mar 09, 1946  [name] is a 74 y.o. year old female who is a primary care patient of Cox, Kirsten, MD. I reached out to [name] by phone today in response to a referral sent by [name] PCP, Cox, Kirsten, MD.   [name] was given information about Chronic Care Management services today including:  1. CCM service includes personalized support from designated clinical staff supervised by her physician, including individualized plan of care and coordination with other care providers 2. 24/7 contact phone numbers for assistance for urgent and routine care needs. 3. Service will only be billed when office clinical staff spend 20 minutes or more in a month to coordinate care. 4. Only one practitioner may furnish and bill the service in a calendar month. 5. The patient may stop CCM services at any time (effective at the end of the month) by phone call to the office staff.   Patient agreed to services and verbal consent obtained.   This note is not being shared with the patient for the following reason: To respect privacy (The patient or proxy has requested that the information not be shared).  Follow up plan:   [name] Upstream Scheduler

## 2019-09-07 DIAGNOSIS — M545 Low back pain: Secondary | ICD-10-CM | POA: Diagnosis not present

## 2019-09-07 DIAGNOSIS — M8588 Other specified disorders of bone density and structure, other site: Secondary | ICD-10-CM | POA: Diagnosis not present

## 2019-09-07 DIAGNOSIS — M5416 Radiculopathy, lumbar region: Secondary | ICD-10-CM | POA: Diagnosis not present

## 2019-09-07 DIAGNOSIS — Z4789 Encounter for other orthopedic aftercare: Secondary | ICD-10-CM | POA: Diagnosis not present

## 2019-09-07 DIAGNOSIS — M4856XA Collapsed vertebra, not elsewhere classified, lumbar region, initial encounter for fracture: Secondary | ICD-10-CM | POA: Diagnosis not present

## 2019-09-07 DIAGNOSIS — M546 Pain in thoracic spine: Secondary | ICD-10-CM | POA: Diagnosis not present

## 2019-09-07 DIAGNOSIS — Z8781 Personal history of (healed) traumatic fracture: Secondary | ICD-10-CM | POA: Diagnosis not present

## 2019-09-07 DIAGNOSIS — M5136 Other intervertebral disc degeneration, lumbar region: Secondary | ICD-10-CM | POA: Diagnosis not present

## 2019-09-07 DIAGNOSIS — M48061 Spinal stenosis, lumbar region without neurogenic claudication: Secondary | ICD-10-CM | POA: Diagnosis not present

## 2019-09-07 DIAGNOSIS — M5430 Sciatica, unspecified side: Secondary | ICD-10-CM | POA: Diagnosis not present

## 2019-09-07 DIAGNOSIS — Z981 Arthrodesis status: Secondary | ICD-10-CM | POA: Diagnosis not present

## 2019-09-07 DIAGNOSIS — I700 Atherosclerosis of aorta: Secondary | ICD-10-CM | POA: Diagnosis not present

## 2019-09-07 DIAGNOSIS — Z95828 Presence of other vascular implants and grafts: Secondary | ICD-10-CM | POA: Diagnosis not present

## 2019-09-07 DIAGNOSIS — M549 Dorsalgia, unspecified: Secondary | ICD-10-CM | POA: Diagnosis not present

## 2019-09-07 DIAGNOSIS — M47816 Spondylosis without myelopathy or radiculopathy, lumbar region: Secondary | ICD-10-CM | POA: Diagnosis not present

## 2019-09-07 DIAGNOSIS — Z87891 Personal history of nicotine dependence: Secondary | ICD-10-CM | POA: Diagnosis not present

## 2019-09-08 ENCOUNTER — Other Ambulatory Visit: Payer: Self-pay

## 2019-09-08 DIAGNOSIS — M5416 Radiculopathy, lumbar region: Secondary | ICD-10-CM

## 2019-09-08 LAB — HM DEXA SCAN

## 2019-09-11 ENCOUNTER — Telehealth: Payer: Self-pay

## 2019-09-11 NOTE — Telephone Encounter (Signed)
Donna Little called to report that her back pain has changed some and she is concerned that she needs her urine checked.  She is experiencing groin pain and she is concerned that she could have a UTI.  She is unable to come in.  Dr. Tobie Poet approved that a family member bring in a urine specimen to be checked tomorrow morning.

## 2019-09-12 ENCOUNTER — Other Ambulatory Visit (INDEPENDENT_AMBULATORY_CARE_PROVIDER_SITE_OTHER): Payer: Medicare Other

## 2019-09-12 ENCOUNTER — Other Ambulatory Visit: Payer: Self-pay | Admitting: Family Medicine

## 2019-09-12 ENCOUNTER — Ambulatory Visit: Payer: Medicare Other

## 2019-09-12 ENCOUNTER — Other Ambulatory Visit: Payer: Self-pay

## 2019-09-12 DIAGNOSIS — N3000 Acute cystitis without hematuria: Secondary | ICD-10-CM

## 2019-09-12 LAB — POCT URINALYSIS DIP (CLINITEK)
Bilirubin, UA: NEGATIVE
Blood, UA: NEGATIVE
Glucose, UA: NEGATIVE mg/dL
Ketones, POC UA: NEGATIVE mg/dL
Nitrite, UA: NEGATIVE
POC PROTEIN,UA: NEGATIVE
Spec Grav, UA: 1.02 (ref 1.010–1.025)
Urobilinogen, UA: 0.2 E.U./dL
pH, UA: 6 (ref 5.0–8.0)

## 2019-09-12 MED ORDER — CIPROFLOXACIN HCL 500 MG PO TABS: 500.0000 mg | ORAL_TABLET | Freq: Two times a day (BID) | ORAL | 0 refills | Status: DC

## 2019-09-14 ENCOUNTER — Other Ambulatory Visit: Payer: Self-pay | Admitting: Family Medicine

## 2019-09-15 LAB — URINE CULTURE

## 2019-09-19 ENCOUNTER — Telehealth: Payer: Self-pay

## 2019-09-19 NOTE — Telephone Encounter (Signed)
Donna Little called with concerns about her frequent falls and jerking of her hands.  She has fallen twice in the last week .  She is concerned that her symptoms could be coming from her medication.

## 2019-09-19 NOTE — Telephone Encounter (Signed)
If she can take morphine twice a day instead of three times a day. Kc

## 2019-09-20 NOTE — Chronic Care Management (AMB) (Deleted)
Chronic Care Management Pharmacy  Name: Donna Little  MRN: [id number] DOB: [birth year]  Chief Complaint/ HPI  Donna Little,  74 y.o. , female presents for their Initial CCM visit with the clinical pharmacist {CHL HP Upstream Pharm visit YHCW:[id number]}.  PCP : [name], MD  Their chronic conditions include: CVD, HTN, PE, HLD.   Office Visits:*** 09/19/2019 - patient falling and having some jerking in hands. Dr. Tobie Poet recommended morphine bid vs tid.  09/12/2019 - UTI - s/t Cipro given.  08/15/2019 - back pain acute - prednisone, increase morphine and gabapentin.  06/20/2019 - Trigger finger treated with kenalog.  Consult Visit:*** 09/07/2019 - ortho visit assessing bone scan for sciatica and lumbar compression. 04/19/2019 - Cardiology - no change.  Medications: Outpatient Encounter Medications as of 09/25/2019  Medication Sig  . albuterol (VENTOLIN HFA) 108 (90 Base) MCG/ACT inhaler Inhale into the lungs.  . ciprofloxacin (CIPRO) 500 MG tablet Take 1 tablet (500 mg total) by mouth 2 (two) times daily.  . citalopram (CELEXA) 20 MG tablet Take 1 tablet by mouth once daily  . clopidogrel (PLAVIX) 75 MG tablet Take 75 mg by mouth daily.  Marland Kitchen dicyclomine (BENTYL) 10 MG capsule Take 10 mg by mouth 4 (four) times daily -  before meals and at bedtime.  . Fluticasone-Umeclidin-Vilant (TRELEGY ELLIPTA) 100-62.5-25 MCG/INH AEPB INHALE 1 PUFF ONCE DAILY USE AT THE SAME TIME EACH DAY  . furosemide (LASIX) 20 MG tablet Take 40 mg by mouth 2 (two) times daily.   . furosemide (LASIX) 40 MG tablet Take 2 tablets by mouth twice daily  . gabapentin (NEURONTIN) 800 MG tablet Take 1 tablet (800 mg total) by mouth 3 (three) times daily.  . meclizine (ANTIVERT) 25 MG tablet Take 25 mg by mouth 3 (three) times daily as needed.   . metoprolol succinate (TOPROL-XL) 25 MG 24 hr tablet Take 25 mg by mouth in the morning and at bedtime.   Marland Kitchen morphine (MS CONTIN) 30 MG 12 hr tablet Take 1 tablet (30 mg total)  by mouth in the morning, at noon, and at bedtime.  . nitroGLYCERIN (NITROSTAT) 0.4 MG SL tablet Place under the tongue.  . ondansetron (ZOFRAN-ODT) 4 MG disintegrating tablet   . pantoprazole (PROTONIX) 40 MG tablet Take 40 mg by mouth daily.  . Potassium Chloride ER 20 MEQ TBCR Take 1 tablet by mouth twice daily with food  . pravastatin (PRAVACHOL) 20 MG tablet Take 1 tablet by mouth once daily  . predniSONE (DELTASONE) 50 MG tablet Take 1 tablet (50 mg total) by mouth daily with breakfast.   No facility-administered encounter medications on file as of 09/25/2019.   Allergies  Allergen Reactions  . Codeine   . Methocarbamol   . Sertraline     Insomnia   . Tizanidine    SDOH Screenings   Alcohol Screen:   . Last Alcohol Screening Score (AUDIT):   Depression (PHQ2-9):   . PHQ-2 Score:   Financial Resource Strain:   . Difficulty of Paying Living Expenses:   Food Insecurity:   . Worried About Charity fundraiser in the Last Year:   . [place] in the Last Year:   Housing:   . Last Housing Risk Score:   Physical Activity:   . Days of Exercise per Week:   . Minutes of Exercise per Session:   Social Connections:   . Frequency of Communication with Friends and Family:   . Frequency of Social Gatherings with  Friends and Family:   . Attends Religious Services:   . Active Member of Clubs or Organizations:   . Attends Archivist Meetings:   Marland Kitchen Marital Status:   Stress:   . Feeling of Stress :   Tobacco Use: Medium Risk  . Smoking Tobacco Use: Former Smoker  . Smokeless Tobacco Use: Never Used  Transportation Needs:   . Film/video editor (Medical):   Marland Kitchen Lack of Transportation (Non-Medical):      Current Diagnosis/Assessment:  Goals Addressed   None     Hypertension   BP today is:  {CHL HP UPSTREAM Pharmacist BP ranges:[phone]}  Office blood pressures are  BP Readings from Last 3 Encounters:  08/15/19 (!) 108/52  06/20/19 112/62  01/16/15  130/68    Patient has failed these meds in the past: *** Patient is currently controlled/uncontrolled*** on the following medications: Furosemide *** bid, Metoprolol succinate xl 25 mg bid Patient checks BP at home {CHL HP BP Monitoring Frequency:[phone]}  Patient home BP readings are ranging: ***  We discussed {CHL HP Upstream Pharmacy [phone]}  Plan  Continue {CHL HP Upstream Pharmacy Plans:[phone]}   Hyperlipidemia/Hx of Stroke/CVD   Lipid Panel  No results found for: CHOL, TRIG, HDL, LDLCALC, LDLDIRECT  No flowsheet data found.   The ASCVD Risk score Mikey Bussing DC Jr., et al., 2013) failed to calculate for the following reasons:   Cannot find a previous HDL lab   Cannot find a previous total cholesterol lab   Patient has failed these meds in past: omega 3 1g daily Patient is currently {CHL Controlled/Uncontrolled:[phone]} on the following medications:  . Pravastatin 20 mg daily . NTG 0.4 mg prn . Clopidogrel 75 mg daily  We discussed:  {CHL HP Upstream Pharmacy [phone]}  Plan  Continue {CHL HP Upstream Pharmacy Plans:[phone]}   Diabetes   Recent Relevant Labs: No results found for: HGBA1C, EGFR, MICROALBUR   Checking BG: {CHL HP Blood Glucose Monitoring Frequency:[phone]}  Recent FBG Readings: Recent pre-meal BG readings: *** Recent 2hr PP BG readings:  *** Recent HS BG readings: *** Patient has failed these meds in past: *** Patient is currently {CHL Controlled/Uncontrolled:[phone]} on the following medications: ***  Last diabetic Foot exam: No results found for: HMDIABEYEEXA  Last diabetic Eye exam: No results found for: HMDIABFOOTEX   We discussed: {CHL HP Upstream Pharmacy [phone]}  Plan  Continue {CHL HP Upstream Pharmacy Plans:[phone]}   COPD / Asthma / Tobacco   Last spirometry score: ***  Gold Grade: {CHL HP Upstream Pharm COPD Gold XBLTJ:[id number]} Current COPD Classification:   {CHL HP Upstream Pharm COPD Classification:[phone]}  Eosinophil count:  No results found for: EOSPCT%                               Eos (Absolute): No results found for: EOSABS  Tobacco Status:  Social History   Tobacco Use  Smoking Status Former Smoker  Smokeless Tobacco Never Used    Patient has failed these meds in past: *** Patient is currently {CHL Controlled/Uncontrolled:[phone]} on the following medications: Albuterol prn, Trelegy Ellipta 1 puff once daily Using maintenance inhaler regularly? {yes/no:20286} Frequency of rescue inhaler use:  {CHL HP Upstream Pharm Inhaler TMAU:[id number]}  We discussed:  {CHL HP Upstream Pharmacy [phone]}  Plan  Continue {CHL HP Upstream Pharmacy WLSLH:[id number]}   ***GERD/IBS   Patient has failed these meds in past: *** Patient is currently {CHL Controlled/Uncontrolled:[phone]} on the following medications:  .  Dicyclomine 10 mg qid  . Ondansetron ODT 4 mg *** . Pantoprazole 40 mg daily   We discussed:  ***  Plan  Continue {CHL HP Upstream Pharmacy Plans:[phone]}      and  Other Diagnosis:Back Pain    Patient has failed these meds in past: *** Patient is currently {CHL Controlled/[phone]} on the following medications: ms contin 30 mg tid, gabapentin 800 mg tid  We discussed:  {CHL HP Upstream Pharmacy discussion:[phone]}  Plan  Continue {CHL HP Upstream Pharmacy WEXHB:[id number]}  ***Depression   Patient has failed these meds in past: *** Patient is currently {CHL Controlled/[phone]} on the following medications:  . Citalopram 20 mg daily  We discussed:  ***  Plan  Continue {CHL HP Upstream Pharmacy Plans:[phone]}   Osteopenia / Osteoporosis   Last DEXA Scan: 04/22/2018  T-Score femoral neck: -2.8  T-Score forearm radius: -3.3    No results found for: VD25OH   Patient is a candidate for pharmacologic treatment due to T-Score < -2.5 in  femoral neck  Patient has failed these meds in past: *** Patient is currently {CHL Controlled/[phone]} on the following medications: ***  We discussed:  {Osteoporosis Counseling:23892}  Plan  Continue {CHL HP Upstream Pharmacy BOFBP:[id number]}   Health Maintenance   Patient is currently {CHL Controlled/[phone]} on the following medications:  Marland Kitchen Meclizine 25 mg tid prn . Potassium Chloride ER 20 meq bid  We discussed:  ***  Plan  Continue {CHL HP Upstream Pharmacy EUMPN:[id number]}  Vaccines   Reviewed and discussed patient's vaccination history.    Immunization History  Administered Date(s) Administered  . Influenza-Unspecified 12/19/2018  . Pneumococcal Polysaccharide-23 05/27/2018  . Zoster Recombinat (Shingrix) 12/16/2017, 05/27/2018    Plan  Recommended patient receive *** vaccine in *** office/pharmacy.   Medication Management   Pt uses *** pharmacy for all medications Uses pill box? {Yes or If no, why not?:20788} Pt endorses ***% compliance  We discussed: ***  Plan  {[facility] GQQP:61950}    Follow up: *** month phone visit  ***

## 2019-09-21 ENCOUNTER — Encounter: Payer: Self-pay | Admitting: Family Medicine

## 2019-09-21 ENCOUNTER — Ambulatory Visit (INDEPENDENT_AMBULATORY_CARE_PROVIDER_SITE_OTHER): Payer: Medicare Other | Admitting: Family Medicine

## 2019-09-21 ENCOUNTER — Other Ambulatory Visit: Payer: Self-pay

## 2019-09-21 VITALS — BP 120/60 | HR 80 | Temp 97.5°F | Resp 16 | Ht 63.0 in | Wt 132.0 lb

## 2019-09-21 DIAGNOSIS — S0003XA Contusion of scalp, initial encounter: Secondary | ICD-10-CM | POA: Diagnosis not present

## 2019-09-21 DIAGNOSIS — M25551 Pain in right hip: Secondary | ICD-10-CM | POA: Insufficient documentation

## 2019-09-21 DIAGNOSIS — N390 Urinary tract infection, site not specified: Secondary | ICD-10-CM | POA: Diagnosis not present

## 2019-09-21 DIAGNOSIS — R410 Disorientation, unspecified: Secondary | ICD-10-CM | POA: Insufficient documentation

## 2019-09-21 DIAGNOSIS — R21 Rash and other nonspecific skin eruption: Secondary | ICD-10-CM | POA: Insufficient documentation

## 2019-09-21 DIAGNOSIS — B9689 Other specified bacterial agents as the cause of diseases classified elsewhere: Secondary | ICD-10-CM | POA: Diagnosis not present

## 2019-09-21 DIAGNOSIS — W1830XA Fall on same level, unspecified, initial encounter: Secondary | ICD-10-CM | POA: Diagnosis not present

## 2019-09-21 DIAGNOSIS — R441 Visual hallucinations: Secondary | ICD-10-CM | POA: Diagnosis not present

## 2019-09-21 NOTE — Telephone Encounter (Signed)
Patient notified.  She is going to try to decrease as instructed.

## 2019-09-21 NOTE — Progress Notes (Addendum)
Acute Office Visit  Subjective:    Patient ID: Donna Little, female    DOB: [birth year], 74 y.o.   MRN: [id number]  Chief Complaint  Patient presents with  . Rash  . Hip Pain  Contusion of head.  HPI Patient is in today for rash and hip pain. Diffuse itchy rash. In privates. Not on bottom of feet or on her palms. No one else has rash similar. She did just come off cipro Sunday, but the rash started after completion. She has some new lotion and soap she is using.  Right hip pain resulted from fall. She fell backwards hitting her head. No LOC. She does not know why she fell. Per the patient she was confused following the fall at least through Monday. She is currently taking plavix. She is having significant rt hip pain. Pt has osteoporosis and has had numerous problems with her spine.    Past Medical History:  Diagnosis Date  . A-fib ([place])   . Chronic pain syndrome   . Chronic systolic heart failure ([place])   . Depression   . Generalized atherosclerosis   . GERD (gastroesophageal reflux disease)   . Hypertension   . IBS (irritable bowel syndrome)   . Melanoma ([place])   . Other specified nontoxic goiter   . Palpitations   . Polymyalgia rheumatica ([place])   . Primary pulmonary hypertension ([place])   . Pulmonary embolism ([place])   . Stroke ([place])    ministroke  . Supraventricular tachycardia ([place])   . Type 2 diabetes mellitus ([place])     Past Surgical History:  Procedure Laterality Date  . ABDOMINAL HYSTERECTOMY    . APPENDECTOMY    . CATARACT EXTRACTION    . CORONARY ANGIOPLASTY WITH STENT PLACEMENT      Family History  Problem Relation Age of Onset  . Diabetes Other   . Stroke Other   . Heart disease Other     Social History   Socioeconomic History  . Marital status: Single    Spouse name: Not on file  . Number of children: 2  . Years of education: Not on file  . Highest education level: Not on file  Occupational History  . Occupation: retired  Tobacco Use  .  Smoking status: Former Research scientist (life sciences)  . Smokeless tobacco: Never Used  Substance and Sexual Activity  . Alcohol use: No    Alcohol/week: 0.0 standard drinks  . Drug use: No  . Sexual activity: Not on file  Other Topics Concern  . Not on file  Social History Narrative  . Not on file   Social Determinants of Health   Financial Resource Strain:   . Difficulty of Paying Living Expenses:   Food Insecurity:   . Worried About Charity fundraiser in the Last Year:   . Arboriculturist in the Last Year:   Transportation Needs:   . Film/video editor (Medical):   Marland Kitchen Lack of Transportation (Non-Medical):   Physical Activity:   . Days of Exercise per Week:   . Minutes of Exercise per Session:   Stress:   . Feeling of Stress :   Social Connections:   . Frequency of Communication with Friends and Family:   . Frequency of Social Gatherings with Friends and Family:   . Attends Religious Services:   . Active Member of Clubs or Organizations:   . Attends Archivist Meetings:   Marland Kitchen Marital Status:   Intimate Partner Violence:   .  Fear of Current or Ex-Partner:   . Emotionally Abused:   Marland Kitchen Physically Abused:   . Sexually Abused:     Outpatient Medications Prior to Visit  Medication Sig Dispense Refill  . albuterol (VENTOLIN HFA) 108 (90 Base) MCG/ACT inhaler Inhale into the lungs.    . citalopram (CELEXA) 20 MG tablet Take 1 tablet by mouth once daily 90 tablet 0  . clopidogrel (PLAVIX) 75 MG tablet Take 75 mg by mouth daily.    Marland Kitchen dicyclomine (BENTYL) 10 MG capsule Take 10 mg by mouth 4 (four) times daily -  before meals and at bedtime.    . Fluticasone-Umeclidin-Vilant (TRELEGY ELLIPTA) 100-62.5-25 MCG/INH AEPB INHALE 1 PUFF ONCE DAILY USE AT THE SAME TIME EACH DAY    . furosemide (LASIX) 20 MG tablet Take 40 mg by mouth 2 (two) times daily.     . furosemide (LASIX) 40 MG tablet Take 2 tablets by mouth twice daily 360 tablet 0  . gabapentin (NEURONTIN) 800 MG tablet Take 1 tablet  (800 mg total) by mouth 3 (three) times daily. 90 tablet 3  . meclizine (ANTIVERT) 25 MG tablet Take 25 mg by mouth 3 (three) times daily as needed.     . metoprolol succinate (TOPROL-XL) 25 MG 24 hr tablet Take 25 mg by mouth in the morning and at bedtime.     Marland Kitchen morphine (MS CONTIN) 30 MG 12 hr tablet Take 1 tablet (30 mg total) by mouth in the morning, at noon, and at bedtime. 90 tablet 0  . nitroGLYCERIN (NITROSTAT) 0.4 MG SL tablet Place under the tongue.    . ondansetron (ZOFRAN-ODT) 4 MG disintegrating tablet     . pantoprazole (PROTONIX) 40 MG tablet Take 40 mg by mouth daily.    . Potassium Chloride ER 20 MEQ TBCR Take 1 tablet by mouth twice daily with food 180 tablet 0  . pravastatin (PRAVACHOL) 20 MG tablet Take 1 tablet by mouth once daily 90 tablet 0  . predniSONE (DELTASONE) 50 MG tablet Take 1 tablet (50 mg total) by mouth daily with breakfast. 5 tablet 0  . ciprofloxacin (CIPRO) 500 MG tablet Take 1 tablet (500 mg total) by mouth 2 (two) times daily. 14 tablet 0   No facility-administered medications prior to visit.    Allergies  Allergen Reactions  . Codeine   . Methocarbamol   . Sertraline     Insomnia   . Tizanidine     Review of Systems  Constitutional: Positive for fatigue. Negative for chills and fever.  HENT: Negative for congestion, ear pain, rhinorrhea and sore throat.   Respiratory: Positive for cough and shortness of breath.   Cardiovascular: Negative for chest pain and palpitations.  Gastrointestinal: Negative for abdominal pain, constipation, diarrhea and nausea.  Genitourinary: Positive for dysuria and frequency. Negative for hematuria.  Musculoskeletal: Positive for arthralgias, back pain and myalgias.  Skin: Positive for rash.  Neurological: Positive for dizziness and headaches. Negative for weakness.  Psychiatric/Behavioral: Positive for dysphoric mood. The patient is nervous/anxious.        Objective:    Physical Exam Vitals reviewed.    Constitutional:      Appearance: Normal appearance. She is normal weight.  Neck:     Vascular: No carotid bruit.  Cardiovascular:     Rate and Rhythm: Normal rate and regular rhythm.     Pulses: Normal pulses.     Heart sounds: Normal heart sounds.  Pulmonary:     Effort: Pulmonary effort is normal.  No respiratory distress.     Breath sounds: Normal breath sounds.  Abdominal:     General: Abdomen is flat. Bowel sounds are normal.     Palpations: Abdomen is soft.     Tenderness: There is no abdominal tenderness.  Musculoskeletal:     Comments: Right hip able to flex and extend with significant discomfort.   Skin:    Findings: Rash (macular rash over trunk, dorsum of hands and top of feet. on legs. face spared. ) present.     Comments: SECOND DEGREE BURN ON POSTERIOR RIGHT SIDE OF HER BACK.  Neurological:     Mental Status: She is alert and oriented to person, place, and time.     Comments: Tenderness posterior scalp.  Psychiatric:        Mood and Affect: Mood normal.        Behavior: Behavior normal.   MMSE; 30/30.  BP 120/60   Pulse 80   Temp (!) 97.5 F (36.4 C)   Resp 16   Ht _0  (1.6 m)   Wt 132 lb (59.9 kg)   BMI 23.38 kg/m  Wt Readings from Last 3 Encounters:  09/21/19 132 lb (59.9 kg)  08/15/19 140 lb (63.5 kg)  06/20/19 140 lb (63.5 kg)    Health Maintenance Due  Topic Date Due  . Hepatitis C Screening  Never done  . URINE MICROALBUMIN  Never done  . COVID-19 Vaccine (1) Never done  . TETANUS/TDAP  Never done  . PNA vac Low Risk Adult (2 of 2 - PCV13) 05/28/2019    There are no preventive care reminders to display for this patient.   No results found for: TSH No results found for: WBC, HGB, HCT, MCV, PLT No results found for: NA, K, CHLORIDE, CO2, GLUCOSE, BUN, CREATININE, BILITOT, ALKPHOS, AST, ALT, PROT, ALBUMIN, CALCIUM, ANIONGAP, EGFR, GFR No results found for: CHOL No results found for: HDL No results found for: LDLCALC No results found  for: TRIG No results found for: CHOLHDL No results found for: HGBA1C     Assessment & Plan:  1. Confusion  refer to hospital for evaluation in ED.  Although confusion has improved, it is still concerning for possible subdural hemorrhage.   2. Contusion of scalp, initial encounter Rule out subdural hemorrhage  3. Right hip pain -  needs hip and pelvic xray to be sure she has not fractured anything.   4. Rash - looks like a drug rash. I considered scabies, but I see no pustules or excoriations.  Plan was to give kenalog, but now am sending to the ED. Would appreciate treatment of rash.    Follow-up: Return if symptoms worsen or fail to improve.  An After Visit Summary was printed and given to the patient.  [name] [facility] [phone]

## 2019-09-22 ENCOUNTER — Telehealth: Payer: Self-pay

## 2019-09-22 NOTE — Telephone Encounter (Signed)
Donna Little called to report that her Mom checked out well in ED with no fractures.  She is concerned that her Mom's anti-depressant is actuallly making her "loopy" and she has had more problems concentrating and she seems to struggle with getting her words out.  She also has been depressed and the medication does not seem to be working very effectively.

## 2019-09-24 NOTE — Telephone Encounter (Signed)
Not sure why celexa would be making her loopy. It is more likely that it is the higher dose of morphine, but I feel like she needs this due to fractures in her back. I am happy to stop the citalopram and put her on something else, if her depression has worsened.  I would recommend we start effexor xr 75 mg once in am, then increase to 75 mg 2 daily in am. Continue citaloprahm the firs tweek then stop it the second week. Kc

## 2019-09-25 ENCOUNTER — Telehealth: Payer: Medicare Other

## 2019-09-25 ENCOUNTER — Other Ambulatory Visit: Payer: Self-pay | Admitting: Family Medicine

## 2019-09-25 ENCOUNTER — Other Ambulatory Visit: Payer: Self-pay

## 2019-09-25 MED ORDER — MORPHINE SULFATE ER 30 MG PO TBCR: 30.0000 mg | EXTENDED_RELEASE_TABLET | Freq: Three times a day (TID) | ORAL | 0 refills | Status: DC

## 2019-09-29 ENCOUNTER — Other Ambulatory Visit: Payer: Self-pay

## 2019-09-29 ENCOUNTER — Ambulatory Visit: Payer: Medicare Other

## 2019-09-29 DIAGNOSIS — I10 Essential (primary) hypertension: Secondary | ICD-10-CM

## 2019-09-29 DIAGNOSIS — E119 Type 2 diabetes mellitus without complications: Secondary | ICD-10-CM

## 2019-09-29 DIAGNOSIS — E782 Mixed hyperlipidemia: Secondary | ICD-10-CM

## 2019-09-29 MED ORDER — DICYCLOMINE HCL 10 MG PO CAPS: 10.0000 mg | ORAL_CAPSULE | Freq: Three times a day (TID) | ORAL | 0 refills | Status: DC

## 2019-09-29 MED ORDER — VENLAFAXINE HCL ER 75 MG PO CP24: 75.0000 mg | ORAL_CAPSULE | Freq: Every day | ORAL | 0 refills | Status: DC

## 2019-09-29 MED ORDER — TRELEGY ELLIPTA 100-62.5-25 MCG/INH IN AEPB: INHALATION_SPRAY | RESPIRATORY_TRACT | 1 refills | Status: DC
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
## 2019-09-29 NOTE — Patient Instructions (Addendum)
Visit Information  Thank you for your time discussing your medications. I look forward to working with you to achieve your health care goals. Below is a summary of what we talked about during our visit.   Goals Addressed            This Visit's Progress   . Pharmacy Care Plan       CARE PLAN ENTRY (see longitudinal plan of care for additional care plan information)  Current Barriers:  . Chronic Disease Management support, education, and care coordination needs related to Hypertension, Hyperlipidemia, and COPD   Hypertension BP Readings from Last 3 Encounters:  09/21/19 120/60  08/15/19 (!) 108/52  06/20/19 112/62   . Pharmacist Clinical Goal(s): o Over the next 90 days, patient will work with PharmD and providers to maintain BP goal <140/90 . Current regimen:  o Furosemide 80 mg bid . Interventions: o Recommend patient work on improving diet and avoid salting food.  . Patient self care activities - Over the next 90 days, patient will: o Ensure daily salt intake < 2300 mg/day  Hyperlipidemia No results found for: LDLCALC, LDLDIRECT . Pharmacist Clinical Goal(s): o Over the next 90 days, patient will work with PharmD and providers to maintain LDL goal < 100 . Current regimen:  o Pravastatin 20 mg daily  . Interventions: o Recommend patient watch diet and focus on incorporating lean meats and vegetables.  . Patient self care activities - Over the next 90 days, patient will: o Continue to take pravastatin as precribed.   Depression . Pharmacist Clinical Goal(s): o Over the next 90 days, patient will work with PharmD and providers to achieve goal of improved management of symptoms of crying . Current regimen:  o N/a . Interventions: o Begin taking Effexor as prescribed . Patient self care activities - Over the next 90 days, patient will: o Contact provider with any concerns of questions  COPD . Pharmacist Clinical Goal(s) o Over the next 90 days, patient will work  with PharmD and providers to reduce risk of COPD flares.  . Current regimen:  o Albuterol prn  . Interventions: o Resume Trelegy inhaler daily for prevention of shortness of breath or wheezine.  . Patient self care activities - Over the next 90 days, patient will: o Take Trelegy daily as prescribed.   Medication management . Pharmacist Clinical Goal(s): o Over the next 90 days, patient will work with PharmD and providers to achieve optimal medication adherence . Current pharmacy: [facility] . Interventions o Comprehensive medication review performed. o Continue current medication management strategy . Patient self care activities - Over the next 90 days, patient will: o Focus on medication adherence by continuing to use pill box.  o Take medications as prescribed o Report any questions or concerns to PharmD and/or provider(s)  Initial goal documentation        Ms. Burr was given information about Chronic Care Management services today including:  1. CCM service includes personalized support from designated clinical staff supervised by her physician, including individualized plan of care and coordination with other care providers 2. 24/7 contact phone numbers for assistance for urgent and routine care needs. 3. Standard insurance, coinsurance, copays and deductibles apply for chronic care management only during months in which we provide at least 20 minutes of these services. Most insurances cover these services at 100%, however patients may be responsible for any copay, coinsurance and/or deductible if applicable. This service may help you avoid the need for more  expensive face-to-face services. 4. Only one practitioner may furnish and bill the service in a calendar month. 5. The patient may stop CCM services at any time (effective at the end of the month) by phone call to the office staff.  Patient agreed to services and verbal consent obtained.   The patient verbalized  understanding of instructions provided today and agreed to receive a mailed copy of patient instruction and/or educational materials. Telephone follow up appointment with pharmacy team member scheduled for: July 2021  Sherre Poot, PharmD Clinical Pharmacist [facility] [phone] (office) [phone] (mobile)   DASH Eating Plan DASH stands for "Dietary Approaches to Stop Hypertension." The DASH eating plan is a healthy eating plan that has been shown to reduce high blood pressure (hypertension). It may also reduce your risk for type 2 diabetes, heart disease, and stroke. The DASH eating plan may also help with weight loss. What are tips for following this plan?  General guidelines  Avoid eating more than 2,300 mg (milligrams) of salt (sodium) a day. If you have hypertension, you may need to reduce your sodium intake to 1,500 mg a day.  Limit alcohol intake to no more than 1 drink a day for nonpregnant women and 2 drinks a day for men. One drink equals 12 oz of beer, 5 oz of wine, or 1 oz of hard liquor.  Work with your health care provider to maintain a healthy body weight or to lose weight. Ask what an ideal weight is for you.  Get at least 30 minutes of exercise that causes your heart to beat faster (aerobic exercise) most days of the week. Activities may include walking, swimming, or biking.  Work with your health care provider or diet and nutrition specialist (dietitian) to adjust your eating plan to your individual calorie needs. Reading food labels   Check food labels for the amount of sodium per serving. Choose foods with less than 5 percent of the Daily Value of sodium. Generally, foods with less than 300 mg of sodium per serving fit into this eating plan.  To find whole grains, look for the word "whole" as the first word in the ingredient list. Shopping  Buy products labeled as "low-sodium" or "no salt added."  Buy fresh foods. Avoid canned foods and  premade or frozen meals. Cooking  Avoid adding salt when cooking. Use salt-free seasonings or herbs instead of table salt or sea salt. Check with your health care provider or pharmacist before using salt substitutes.  Do not fry foods. Cook foods using healthy methods such as baking, boiling, grilling, and broiling instead.  Cook with heart-healthy oils, such as olive, canola, soybean, or sunflower oil. Meal planning  Eat a balanced diet that includes: ? 5 or more servings of fruits and vegetables each day. At each meal, try to fill half of your plate with fruits and vegetables. ? Up to 6-8 servings of whole grains each day. ? Less than 6 oz of lean meat, poultry, or fish each day. A 3-oz serving of meat is about the same size as a deck of cards. One egg equals 1 oz. ? 2 servings of low-fat dairy each day. ? A serving of nuts, seeds, or beans 5 times each week. ? Heart-healthy fats. Healthy fats called Omega-3 fatty acids are found in foods such as flaxseeds and coldwater fish, like sardines, salmon, and mackerel.  Limit how much you eat of the following: ? Canned or prepackaged foods. ? Food that is high  in trans fat, such as fried foods. ? Food that is high in saturated fat, such as fatty meat. ? Sweets, desserts, sugary drinks, and other foods with added sugar. ? Full-fat dairy products.  Do not salt foods before eating.  Try to eat at least 2 vegetarian meals each week.  Eat more home-cooked food and less restaurant, buffet, and fast food.  When eating at a restaurant, ask that your food be prepared with less salt or no salt, if possible. What foods are recommended? The items listed may not be a complete list. Talk with your dietitian about what dietary choices are best for you. Grains Whole-grain or whole-wheat bread. Whole-grain or whole-wheat pasta. [place]inski rice. [place]. Bulgur. Whole-grain and low-sodium cereals. Pita bread. Low-fat, low-sodium crackers. Whole-wheat  flour tortillas. Vegetables Fresh or frozen vegetables (raw, steamed, roasted, or grilled). Low-sodium or reduced-sodium tomato and vegetable juice. Low-sodium or reduced-sodium tomato sauce and tomato paste. Low-sodium or reduced-sodium canned vegetables. Fruits All fresh, dried, or frozen fruit. Canned fruit in natural juice (without added sugar). Meat and other protein foods Skinless chicken or [place]. Ground chicken or [place]. Pork with fat trimmed off. Fish and seafood. Egg whites. Dried beans, peas, or lentils. Unsalted nuts, nut butters, and seeds. Unsalted canned beans. Lean cuts of beef with fat trimmed off. Low-sodium, lean deli meat. Dairy Low-fat (1%) or fat-free (skim) milk. Fat-free, low-fat, or reduced-fat cheeses. Nonfat, low-sodium ricotta or cottage cheese. Low-fat or nonfat yogurt. Low-fat, low-sodium cheese. Fats and oils Soft margarine without trans fats. Vegetable oil. Low-fat, reduced-fat, or light mayonnaise and salad dressings (reduced-sodium). Canola, safflower, olive, soybean, and sunflower oils. Avocado. Seasoning and other foods Herbs. Spices. Seasoning mixes without salt. Unsalted popcorn and pretzels. Fat-free sweets. What foods are not recommended? The items listed may not be a complete list. Talk with your dietitian about what dietary choices are best for you. Grains Baked goods made with fat, such as croissants, muffins, or some breads. Dry pasta or rice meal packs. Vegetables Creamed or fried vegetables. Vegetables in a cheese sauce. Regular canned vegetables (not low-sodium or reduced-sodium). Regular canned tomato sauce and paste (not low-sodium or reduced-sodium). Regular tomato and vegetable juice (not low-sodium or reduced-sodium). Angie Fava. Olives. Fruits Canned fruit in a light or heavy syrup. Fried fruit. Fruit in cream or butter sauce. Meat and other protein foods Fatty cuts of meat. Ribs. Fried meat. Berniece Salines. Sausage. Bologna and other processed lunch  meats. Salami. Fatback. Hotdogs. Bratwurst. Salted nuts and seeds. Canned beans with added salt. Canned or smoked fish. Whole eggs or egg yolks. Chicken or [place] with skin. Dairy Whole or 2% milk, cream, and half-and-half. Whole or full-fat cream cheese. Whole-fat or sweetened yogurt. Full-fat cheese. Nondairy creamers. Whipped toppings. Processed cheese and cheese spreads. Fats and oils Butter. Stick margarine. Lard. Shortening. Ghee. Bacon fat. Tropical oils, such as coconut, palm kernel, or palm oil. Seasoning and other foods Salted popcorn and pretzels. Onion salt, garlic salt, seasoned salt, table salt, and sea salt. Worcestershire sauce. Tartar sauce. Barbecue sauce. Teriyaki sauce. Soy sauce, including reduced-sodium. Steak sauce. Canned and packaged gravies. Fish sauce. Oyster sauce. Cocktail sauce. Horseradish that you find on the shelf. Ketchup. Mustard. Meat flavorings and tenderizers. Bouillon cubes. Hot sauce and Tabasco sauce. Premade or packaged marinades. Premade or packaged taco seasonings. Relishes. Regular salad dressings. Where to find more information:  National Heart, Lung, and [place]: https://wilson-eaton.com/  American Heart Association: www.heart.org Summary  The DASH eating plan is a healthy eating plan  that has been shown to reduce high blood pressure (hypertension). It may also reduce your risk for type 2 diabetes, heart disease, and stroke.  With the DASH eating plan, you should limit salt (sodium) intake to 2,300 mg a day. If you have hypertension, you may need to reduce your sodium intake to 1,500 mg a day.  When on the DASH eating plan, aim to eat more fresh fruits and vegetables, whole grains, lean proteins, low-fat dairy, and heart-healthy fats.  Work with your health care provider or diet and nutrition specialist (dietitian) to adjust your eating plan to your individual calorie needs. This information is not intended to replace advice given to you by your  health care provider. Make sure you discuss any questions you have with your health care provider. Document Revised: 03/05/2017 Document Reviewed: 03/16/2016 Elsevier Patient Education  2020 [employer].

## 2019-09-29 NOTE — Telephone Encounter (Signed)
Patient informed.  She does want to change to Effexor.  She has been feeling more alert but she has been having crying episodes and she does not feel that the celexa is working well for her.

## 2019-10-05 ENCOUNTER — Ambulatory Visit (INDEPENDENT_AMBULATORY_CARE_PROVIDER_SITE_OTHER): Payer: Medicare Other | Admitting: Legal Medicine

## 2019-10-05 ENCOUNTER — Other Ambulatory Visit: Payer: Self-pay

## 2019-10-05 ENCOUNTER — Encounter: Payer: Self-pay | Admitting: Legal Medicine

## 2019-10-05 VITALS — BP 110/60 | HR 75 | Temp 97.0°F | Resp 16 | Ht 63.0 in | Wt 132.0 lb

## 2019-10-05 DIAGNOSIS — R410 Disorientation, unspecified: Secondary | ICD-10-CM

## 2019-10-05 DIAGNOSIS — M25551 Pain in right hip: Secondary | ICD-10-CM

## 2019-10-05 DIAGNOSIS — S0003XD Contusion of scalp, subsequent encounter: Secondary | ICD-10-CM

## 2019-10-05 MED ORDER — PREDNISONE 50 MG PO TABS: 50.0000 mg | ORAL_TABLET | Freq: Every day | ORAL | 0 refills | Status: DC

## 2019-10-05 NOTE — Progress Notes (Signed)
Subjective:  Patient ID: Donna Little, female    DOB: 1945/05/15  Age: 74 y.o. MRN: [id number]  Chief Complaint  Patient presents with  . Hip Pain    HPI: patient fell 3 weeks a go on back and hit hip and head.  She was seen by Dr. Tobie Poet.  She had x-rays. In past.  She fell again yesterday. No LOC.  She is having trouble walking.  And uses walker. I am unable to get into Dr. Tobie Poet records- Patient has chronic back pain and is on morphine.  She improved on prednisone.   Current Outpatient Medications on File Prior to Visit  Medication Sig Dispense Refill  . albuterol (VENTOLIN HFA) 108 (90 Base) MCG/ACT inhaler Inhale 2 puffs into the lungs every 6 (six) hours as needed.     . citalopram (CELEXA) 20 MG tablet Take 1 tablet by mouth once daily 90 tablet 0  . clopidogrel (PLAVIX) 75 MG tablet Take 75 mg by mouth daily.    Marland Kitchen denosumab (PROLIA) 60 MG/ML SOSY injection Inject 60 mg into the skin every 6 (six) months.    . diclofenac Sodium (VOLTAREN) 1 % GEL     . dicyclomine (BENTYL) 10 MG capsule Take 1 capsule (10 mg total) by mouth 4 (four) times daily -  before meals and at bedtime. 90 capsule 0  . EC-NAPROXEN 500 MG EC tablet     . Fluticasone-Umeclidin-Vilant (TRELEGY ELLIPTA) 100-62.5-25 MCG/INH AEPB INHALE 1 PUFF ONCE DAILY USE AT THE SAME TIME EACH DAY 1 each 1  . furosemide (LASIX) 20 MG tablet Take 40 mg by mouth 2 (two) times daily.     . furosemide (LASIX) 40 MG tablet Take 2 tablets by mouth twice daily 360 tablet 0  . gabapentin (NEURONTIN) 800 MG tablet Take 1 tablet (800 mg total) by mouth 3 (three) times daily. 90 tablet 3  . meclizine (ANTIVERT) 25 MG tablet Take 25 mg by mouth 3 (three) times daily as needed.     . metoprolol succinate (TOPROL-XL) 25 MG 24 hr tablet Take 25 mg by mouth in the morning and at bedtime.     . metoprolol tartrate (LOPRESSOR) 25 MG tablet TAKE 1/2 TO 1 (ONE-HALF TO ONE) TABLET BY MOUTH TWICE DAILY    . morphine (MS CONTIN) 30 MG 12 hr tablet Take 1  tablet (30 mg total) by mouth in the morning, at noon, and at bedtime. 90 tablet 0  . nitroGLYCERIN (NITROSTAT) 0.4 MG SL tablet Place under the tongue.    . ondansetron (ZOFRAN-ODT) 4 MG disintegrating tablet Take 4 mg by mouth every 8 (eight) hours as needed.     . pantoprazole (PROTONIX) 40 MG tablet Take 40 mg by mouth daily.    . Potassium Chloride ER 20 MEQ TBCR Take 1 tablet by mouth twice daily with food 180 tablet 0  . pravastatin (PRAVACHOL) 20 MG tablet Take 1 tablet by mouth once daily 90 tablet 0  . venlafaxine XR (EFFEXOR-XR) 75 MG 24 hr capsule Take 1 capsule (75 mg total) by mouth daily. Take one daily for 7 days then increase to two daily 49 capsule 0   No current facility-administered medications on file prior to visit.   Past Medical History:  Diagnosis Date  . A-fib ([place])   . Chronic pain syndrome   . Chronic systolic heart failure ([place])   . Depression   . Generalized atherosclerosis   . GERD (gastroesophageal reflux disease)   . Hypertension   .  IBS (irritable bowel syndrome)   . Melanoma ([place])   . Other specified nontoxic goiter   . Palpitations   . Polymyalgia rheumatica ([place])   . Primary pulmonary hypertension ([place])   . Pulmonary embolism ([place])   . Stroke ([place])    ministroke  . Supraventricular tachycardia ([place])   . Type 2 diabetes mellitus ([place])    Past Surgical History:  Procedure Laterality Date  . ABDOMINAL HYSTERECTOMY    . APPENDECTOMY    . CATARACT EXTRACTION    . CORONARY ANGIOPLASTY WITH STENT PLACEMENT      Family History  Problem Relation Age of Onset  . Diabetes Other   . Stroke Other   . Heart disease Other    Social History   Socioeconomic History  . Marital status: Single    Spouse name: Not on file  . Number of children: 2  . Years of education: Not on file  . Highest education level: Not on file  Occupational History  . Occupation: retired  Tobacco Use  . Smoking status: Former Research scientist (life sciences)  . Smokeless tobacco: Never Used    Substance and Sexual Activity  . Alcohol use: No    Alcohol/week: 0.0 standard drinks  . Drug use: No  . Sexual activity: Not on file  Other Topics Concern  . Not on file  Social History Narrative  . Not on file   Social Determinants of Health   Financial Resource Strain:   . Difficulty of Paying Living Expenses:   Food Insecurity: No Food Insecurity  . Worried About Charity fundraiser in the Last Year: Never true  . Ran Out of Food in the Last Year: Never true  Transportation Needs:   . Lack of Transportation (Medical):   Marland Kitchen Lack of Transportation (Non-Medical):   Physical Activity:   . Days of Exercise per Week:   . Minutes of Exercise per Session:   Stress:   . Feeling of Stress :   Social Connections:   . Frequency of Communication with Friends and Family:   . Frequency of Social Gatherings with Friends and Family:   . Attends Religious Services:   . Active Member of Clubs or Organizations:   . Attends Archivist Meetings:   Marland Kitchen Marital Status:     Review of Systems  Constitutional: Negative.   HENT: Negative.   Eyes: Negative.   Respiratory: Negative.   Cardiovascular: Negative.   Gastrointestinal: Negative.   Musculoskeletal: Positive for arthralgias and back pain.     Objective:  BP 110/60   Pulse 75   Temp (!) 97 F (36.1 C)   Resp 16   Ht 5\' 3"  (1.6 m)   Wt 132 lb (59.9 kg)   SpO2 90%   BMI 23.38 kg/m   BP/Weight 10/05/2019 09/21/2019 09/17/4313  Systolic BP 400 867 619  Diastolic BP 60 60 52  Wt. (Lbs) 132 132 140  BMI 23.38 23.38 24.8    Physical Exam Vitals reviewed.  Constitutional:      Appearance: Normal appearance.  HENT:     Head: Normocephalic and atraumatic.  Cardiovascular:     Rate and Rhythm: Normal rate and regular rhythm.     Pulses: Normal pulses.     Heart sounds: Normal heart sounds.  Pulmonary:     Effort: Pulmonary effort is normal.     Breath sounds: Normal breath sounds.  Musculoskeletal:     Thoracic  back: Spasms and tenderness present. Decreased range of motion.  Lumbar back: Spasms and tenderness present. Decreased range of motion. Negative right straight leg raise test and negative left straight leg raise test.       Back:     Right hip: Normal.     Left hip: Normal.  Neurological:     Mental Status: She is alert.     Diabetic Foot Exam - Simple   No data filed       No results found for: WBC, HGB, HCT, PLT, GLUCOSE, CHOL, TRIG, HDL, LDLDIRECT, LDLCALC, ALT, AST, NA, K, CL, CREATININE, BUN, CO2, TSH, PSA, INR, GLUF, HGBA1C, MICROALBUR    Assessment & Plan:   1. Right hip pain This is chronic associated with her back pain and extensive back surgerie.  Start predneisone for 10 days.  2. Contusion of scalp, subsequent encounter Scalp has healed but she still has some occipital pain at times. Negative CT in emergency room.  3. Confusion Patient has no confusion today and walking with rollator         Follow-up: Return in about 2 weeks (around 10/19/2019) for for back.  An After Visit Summary was printed and given to the patient.  [place] [phone]

## 2019-10-10 ENCOUNTER — Telehealth: Payer: Self-pay

## 2019-10-10 NOTE — Telephone Encounter (Signed)
Donna Little called requesting a referral to [facility].  She has been seen by Dr. Trudie Reed in the past and she is having increased leg pain.  She is going to call [phone] for an appointment.

## 2019-10-17 ENCOUNTER — Ambulatory Visit: Payer: Medicare Other

## 2019-10-17 DIAGNOSIS — E782 Mixed hyperlipidemia: Secondary | ICD-10-CM

## 2019-10-17 DIAGNOSIS — I10 Essential (primary) hypertension: Secondary | ICD-10-CM

## 2019-10-17 NOTE — Patient Instructions (Signed)
Visit Information  Goals Addressed            This Visit's Progress   . Pharmacy Care Plan       CARE PLAN ENTRY (see longitudinal plan of care for additional care plan information)  Current Barriers:  . Chronic Disease Management support, education, and care coordination needs related to Hypertension, Hyperlipidemia, and COPD   Hypertension BP Readings from Last 3 Encounters:  10/05/19 110/60  09/21/19 120/60  08/15/19 (!) 108/52   . Pharmacist Clinical Goal(s): o Over the next 90 days, patient will work with PharmD and providers to maintain BP goal <140/90 . Current regimen:  o Furosemide 80 mg bid . Interventions: o Recommend patient work on improving diet and avoid salting food.  . Patient self care activities - Over the next 90 days, patient will: o Ensure daily salt intake < 2300 mg/day  Hyperlipidemia No results found for: LDLCALC, LDLDIRECT . Pharmacist Clinical Goal(s): o Over the next 90 days, patient will work with PharmD and providers to maintain LDL goal < 100 . Current regimen:  o Pravastatin 20 mg daily  . Interventions: o Recommend patient watch diet and focus on incorporating lean meats and vegetables.  . Patient self care activities - Over the next 90 days, patient will: o Continue to take pravastatin as precribed.   Depression . Pharmacist Clinical Goal(s): o Over the next 90 days, patient will work with PharmD and providers to achieve goal of improved management of symptoms of crying . Current regimen:  o N/a . Interventions: o Continue taking Effexor as prescribed. Will discuss possible increase in dosage at next visit.  . Patient self care activities - Over the next 90 days, patient will: o Contact provider with any concerns of questions  COPD . Pharmacist Clinical Goal(s) o Over the next 90 days, patient will work with PharmD and providers to reduce risk of COPD flares.  . Current regimen:  o Albuterol prn  . Interventions: o Continue  Trelegy inhaler daily for prevention of shortness of breath or wheezing.  . Patient self care activities - Over the next 90 days, patient will: o Take Trelegy daily as prescribed.   Medication management . Pharmacist Clinical Goal(s): o Over the next 90 days, patient will work with PharmD and providers to achieve optimal medication adherence . Current pharmacy: [facility] . Interventions o Comprehensive medication review performed. o Continue current medication management strategy . Patient self care activities - Over the next 90 days, patient will: o Focus on medication adherence by continuing to use pill box.  o Take medications as prescribed o Report any questions or concerns to PharmD and/or provider(s)  Please see past updates related to this goal by clicking on the "Past Updates" button in the selected goal         The patient verbalized understanding of instructions provided today and declined a print copy of patient instruction materials.   Telephone follow up appointment with pharmacy team member scheduled for: August 2021  Sherre Poot, PharmD, [facility] Clinical Pharmacist [facility] [phone] (office) [phone] (mobile)   Managing Pain Without Opioids Opioids are strong medicines used to treat moderate to severe pain. For some people, especially those who have long-term (chronic) pain, opioids may not be the best choice for pain management due to:  Side effects like nausea, constipation, and sleepiness.  The risk of addiction (opioid use disorder). The longer you take opioids, the greater your risk of addiction. Pain that lasts  for more than 3 months is called chronic pain. Managing chronic pain usually requires more than one approach and is often provided by a team of health care providers working together (multidisciplinary approach). Pain management may be done at a pain management center or pain clinic. Types of pain management without  opioids Managing pain without opioids can involve:  Non-opioid medicines.  Exercises to help relieve pain and improve strength and range of motion (physical therapy).  Therapy to help with everyday tasks and activities (occupational therapy).  Therapy to help you find ways to relieve pain by doing things you enjoy (recreational therapy).  Talk therapy (psychotherapy) and other mental health therapies.  Medical treatments such as injections or devices.  Making lifestyle changes. Pain management options Non-opioid medicines Non-opioid medicines for pain may include medicines taken by mouth (oral medicines), such as:  Over-the-counter or prescription NSAIDs. These may be the first medicines used for pain. They work well for muscle and bone pain, and they reduce swelling.  Acetaminophen. This over-the-counter medicine may work well for milder pain but not swelling.  Antidepressants. These may be used to treat chronic pain. A certain type of antidepressant (tricyclics) is often used. These medicines are given in lower doses for pain than when used for depression.  Anticonvulsants. These are usually used to treat seizures but may also reduce nerve (neuropathic) pain.  Muscle relaxants. These relieve pain caused by sudden muscle tightening (spasms). You may also use a type of pain medicine that is applied to the skin as a patch, cream, or gel (topical analgesic), such as a numbing medicine. These may cause fewer side effects than oral medicines. Therapy Physical therapy involves doing exercises to gain strength and flexibility. A physical therapist may teach you exercises to move and stretch parts of your body that are weak, stiff, or painful. You can learn these exercises at physical therapy visits and practice them at home. Physical therapy may also involve:  Massage.  Heat wraps or applying heat or cold to affected areas.  Sending electrical signals through the skin to interrupt pain  signals (transcutaneous electrical nerve stimulation, TENS).  Sending weak lasers through the skin to reduce pain and swelling (low-level laser therapy).  Using signals from your body to help you learn to regulate pain (biofeedback). Occupational therapy helps you learn ways to function at home and work with less pain. Recreational therapy may involve trying new activities or hobbies, such as drawing or a physical activity. Types of mental health therapy for pain include:  Cognitive behavioral therapy (CBT) to help you learn coping skills for dealing with pain.  Acceptance and commitment therapy (ACT) to change the way you think and react to pain.  Relaxation therapies, including muscle relaxation exercises and focusing your mind on the present moment to lower stress (mindfulness-based stress reduction).  Pain management counseling. This may be individual, family, or group counseling.  Medical treatments Medical treatments for pain management include:  Nerve block injections. These may include a pain blocker and anti-inflammatory medicines. You may have injections: ? Near the spine to relieve chronic back or neck pain. ? Into joints to relieve back or joint pain. ? Into nerve areas that supply a painful area to relieve body pain. ? Into muscles (trigger point injections) to relieve some painful muscle conditions.  A medical device placed near your spine to help block pain signals and relieve nerve pain or chronic back pain (spinal cord stimulation device).  Acupuncture. Follow these instructions at home Medicines  Take over-the-counter and prescription medicines only as told by your health care provider.  If you are taking pain medicine, ask your health care providers about possible side effects to watch out for.  Do not drive or use heavy machinery while taking prescription pain medicine. Lifestyle   Do not use drugs or alcohol to reduce pain. Limit alcohol intake to no more  than 1 drink a day for nonpregnant women and 2 drinks a day for men. One drink equals 12 oz of beer, 5 oz of wine, or 1 oz of hard liquor.  Do not use any products that contain nicotine or tobacco, such as cigarettes and e-cigarettes. These can delay healing. If you need help quitting, ask your health care provider.  Eat a healthy diet and maintain a healthy weight. Poor diet and excess weight may make pain worse. ? Eat foods that are high in fiber. These include fresh fruits and vegetables, whole grains, and beans. ? Limit foods that are high in fat and processed sugars, such as fried and sweet foods.  Exercise regularly. Exercise lowers stress and may help relieve pain. ? Ask your health care provider what activities and exercises are safe for you. ? If your health care provider approves, join an exercise class that combines movement and stress reduction. Examples include yoga and tai chi.  Get enough sleep. Lack of sleep may make pain worse.  Lower stress as much as possible. Practice stress reduction techniques as told by your therapist. General instructions  Work with all your pain management providers to find the treatments that work best for you. You are an important member of your pain management team. There are many things you can do to reduce pain on your own.  Consider joining an online or in-person support group for people who have chronic pain.  Keep all follow-up visits as told by your health care providers. This is important. Where to find more information You can find more information about managing pain without opioids from:  American Academy of Pain Medicine: painmed.[place] for Chronic Pain: instituteforchronicpain.org  American Chronic Pain Association: theacpa.org Contact a health care provider if:  You have side effects from pain medicine.  Your pain gets worse or does not get better with treatments or home care.  You are struggling with anxiety or  depression. Summary  Many types of pain can be managed without opioids. Chronic pain may respond better to pain management without opioids.  Pain is best managed with a team of providers working together.  Pain management without opioids may include non-opioid medicines, medical treatments, physical therapy, mental health therapy, and lifestyle changes.  Tell your health care providers if your pain gets worse or is not being managed well enough. This information is not intended to replace advice given to you by your health care provider. Make sure you discuss any questions you have with your health care provider. Document Revised: 12/14/2018 Document Reviewed: 01/12/2017 Elsevier Patient Education  [place].
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
## 2019-10-19 DIAGNOSIS — S32040D Wedge compression fracture of fourth lumbar vertebra, subsequent encounter for fracture with routine healing: Secondary | ICD-10-CM | POA: Diagnosis not present

## 2019-10-19 DIAGNOSIS — M47898 Other spondylosis, sacral and sacrococcygeal region: Secondary | ICD-10-CM | POA: Diagnosis not present

## 2019-10-19 DIAGNOSIS — M160 Bilateral primary osteoarthritis of hip: Secondary | ICD-10-CM | POA: Diagnosis not present

## 2019-10-19 DIAGNOSIS — Z981 Arthrodesis status: Secondary | ICD-10-CM | POA: Diagnosis not present

## 2019-10-19 DIAGNOSIS — M25551 Pain in right hip: Secondary | ICD-10-CM | POA: Diagnosis not present

## 2019-10-19 DIAGNOSIS — M545 Low back pain: Secondary | ICD-10-CM | POA: Diagnosis not present

## 2019-10-20 ENCOUNTER — Other Ambulatory Visit: Payer: Self-pay

## 2019-10-20 NOTE — Telephone Encounter (Signed)
Khamiya and Sharyn Lull called to report that she was seen yesterday by Dr. Prince Rome and they are going to schedule a kyphoplasty.  He wanted her to check with Dr. Tobie Poet about how long he should be off the Plavix before the procedure.  Dr. Tobie Poet advised 5-7 days prior to procedure.

## 2019-10-25 ENCOUNTER — Other Ambulatory Visit: Payer: Self-pay

## 2019-10-25 MED ORDER — MORPHINE SULFATE ER 30 MG PO TBCR: 30.0000 mg | EXTENDED_RELEASE_TABLET | Freq: Three times a day (TID) | ORAL | 0 refills | Status: DC

## 2019-10-30 ENCOUNTER — Other Ambulatory Visit: Payer: Self-pay | Admitting: Physician Assistant

## 2019-10-30 ENCOUNTER — Other Ambulatory Visit: Payer: Self-pay

## 2019-10-30 ENCOUNTER — Other Ambulatory Visit: Payer: Self-pay | Admitting: Family Medicine

## 2019-10-30 DIAGNOSIS — E119 Type 2 diabetes mellitus without complications: Secondary | ICD-10-CM

## 2019-10-30 DIAGNOSIS — E782 Mixed hyperlipidemia: Secondary | ICD-10-CM

## 2019-10-30 DIAGNOSIS — I10 Essential (primary) hypertension: Secondary | ICD-10-CM

## 2019-10-30 NOTE — Progress Notes (Signed)
Patient had an appt on [phone] with Sherre Poot, PharmD

## 2019-11-15 ENCOUNTER — Telehealth: Payer: Self-pay

## 2019-11-15 DIAGNOSIS — R9431 Abnormal electrocardiogram [ECG] [EKG]: Secondary | ICD-10-CM | POA: Diagnosis not present

## 2019-11-15 DIAGNOSIS — Z87891 Personal history of nicotine dependence: Secondary | ICD-10-CM | POA: Diagnosis not present

## 2019-11-15 DIAGNOSIS — I10 Essential (primary) hypertension: Secondary | ICD-10-CM | POA: Diagnosis not present

## 2019-11-15 DIAGNOSIS — Z01818 Encounter for other preprocedural examination: Secondary | ICD-10-CM | POA: Diagnosis not present

## 2019-11-15 NOTE — Progress Notes (Addendum)
Chronic Care Management Pharmacy Assistant   Name: Donna Little  MRN: [id number] DOB: November 23, 1945  Reason for Encounter: Medication review   Patient Questions:  1.  Have you seen any other providers since your last visit? No  2.  Any changes in your medicines or health? No     PCP : [name], MD  Allergies:   Allergies  Allergen Reactions  . Codeine   . Methocarbamol   . Sertraline     Insomnia   . Tizanidine     Medications: Outpatient Encounter Medications as of 11/15/2019  Medication Sig  . albuterol (VENTOLIN HFA) 108 (90 Base) MCG/ACT inhaler Inhale 2 puffs into the lungs every 6 (six) hours as needed.   . citalopram (CELEXA) 20 MG tablet Take 1 tablet by mouth once daily  . clopidogrel (PLAVIX) 75 MG tablet Take 75 mg by mouth daily.  Marland Kitchen denosumab (PROLIA) 60 MG/ML SOSY injection Inject 60 mg into the skin every 6 (six) months.  . diclofenac Sodium (VOLTAREN) 1 % GEL   . dicyclomine (BENTYL) 10 MG capsule Take 1 capsule (10 mg total) by mouth 4 (four) times daily -  before meals and at bedtime.  Marland Kitchen EC-NAPROXEN 500 MG EC tablet   . Fluticasone-Umeclidin-Vilant (TRELEGY ELLIPTA) 100-62.5-25 MCG/INH AEPB INHALE 1 PUFF ONCE DAILY USE AT THE SAME TIME EACH DAY  . furosemide (LASIX) 20 MG tablet Take 40 mg by mouth 2 (two) times daily.   . furosemide (LASIX) 40 MG tablet Take 2 tablets by mouth twice daily  . gabapentin (NEURONTIN) 800 MG tablet Take 1 tablet (800 mg total) by mouth 3 (three) times daily.  . meclizine (ANTIVERT) 25 MG tablet Take 25 mg by mouth 3 (three) times daily as needed.   . metoprolol succinate (TOPROL-XL) 25 MG 24 hr tablet Take 25 mg by mouth in the morning and at bedtime.   . metoprolol tartrate (LOPRESSOR) 25 MG tablet TAKE 1/2 TO 1 (ONE-HALF TO ONE) TABLET BY MOUTH TWICE DAILY  . morphine (MS CONTIN) 30 MG 12 hr tablet Take 1 tablet (30 mg total) by mouth in the morning, at noon, and at bedtime.  . nitroGLYCERIN (NITROSTAT) 0.4 MG SL  tablet Place under the tongue.  . ondansetron (ZOFRAN-ODT) 4 MG disintegrating tablet Take 4 mg by mouth every 8 (eight) hours as needed.   . pantoprazole (PROTONIX) 40 MG tablet Take 40 mg by mouth daily.  . Potassium Chloride ER 20 MEQ TBCR Take 1 tablet by mouth twice daily with food  . pravastatin (PRAVACHOL) 20 MG tablet Take 1 tablet by mouth once daily  . predniSONE (DELTASONE) 50 MG tablet Take 1 tablet (50 mg total) by mouth daily with breakfast.  . venlafaxine XR (EFFEXOR-XR) 75 MG 24 hr capsule TAKE 1 CAPSULE BY MOUTH ONCE DAILY FOR 7 DAYS THEN  INCREASE  TO  2  CAPSULES  DAILY   No facility-administered encounter medications on file as of 11/15/2019.    Current Diagnosis: Patient Active Problem List   Diagnosis Date Noted  . Right hip pain 09/21/2019  . Contusion of scalp 09/21/2019  . Rash 09/21/2019  . Confusion 09/21/2019  . Back pain with sciatica 08/21/2019  . Incontinence of urine in female 08/21/2019  . Trigger finger, right ring finger 06/26/2019  . Pulmonary embolism ([place]) 01/12/2017  . Cerebrovascular disease 01/16/2015  . Hyperlipidemia 01/16/2015  . Essential hypertension 01/16/2015  . Tobacco abuse 01/16/2015    Goals Addressed   None  Follow-Up:  Coordination of Enhanced Pharmacy Services   Reached out to patient for medication adherence call. Patient states today that she is taking Venlafaxine 2 times a day and she thinks the Celexa worked just as good and she only had to take it once a day. She would rather go back to Celexa if possible. Will send a message to Sherre Poot, CPP regarding this request.    Martinique Uselman, [place] Pharmacist Assistant  [phone]  Pharmacist scheduled call with patient post-op to address medication change.   Sherre Poot, PharmD, [facility] Clinical Pharmacist [facility] [phone] (office) [phone] (mobile)

## 2019-11-21 ENCOUNTER — Other Ambulatory Visit: Payer: Self-pay | Admitting: Physician Assistant

## 2019-11-21 ENCOUNTER — Telehealth: Payer: Medicare Other

## 2019-11-22 ENCOUNTER — Telehealth: Payer: Medicare Other

## 2019-11-22 DIAGNOSIS — F329 Major depressive disorder, single episode, unspecified: Secondary | ICD-10-CM | POA: Diagnosis not present

## 2019-11-22 DIAGNOSIS — J449 Chronic obstructive pulmonary disease, unspecified: Secondary | ICD-10-CM | POA: Diagnosis not present

## 2019-11-22 DIAGNOSIS — Z8673 Personal history of transient ischemic attack (TIA), and cerebral infarction without residual deficits: Secondary | ICD-10-CM | POA: Diagnosis not present

## 2019-11-22 DIAGNOSIS — Z955 Presence of coronary angioplasty implant and graft: Secondary | ICD-10-CM | POA: Diagnosis not present

## 2019-11-22 DIAGNOSIS — K219 Gastro-esophageal reflux disease without esophagitis: Secondary | ICD-10-CM | POA: Diagnosis not present

## 2019-11-22 DIAGNOSIS — Z9889 Other specified postprocedural states: Secondary | ICD-10-CM | POA: Diagnosis not present

## 2019-11-22 DIAGNOSIS — Z86711 Personal history of pulmonary embolism: Secondary | ICD-10-CM | POA: Diagnosis not present

## 2019-11-22 DIAGNOSIS — R600 Localized edema: Secondary | ICD-10-CM | POA: Diagnosis not present

## 2019-11-22 DIAGNOSIS — Z79899 Other long term (current) drug therapy: Secondary | ICD-10-CM | POA: Diagnosis not present

## 2019-11-22 DIAGNOSIS — F17210 Nicotine dependence, cigarettes, uncomplicated: Secondary | ICD-10-CM | POA: Diagnosis not present

## 2019-11-22 DIAGNOSIS — I2510 Atherosclerotic heart disease of native coronary artery without angina pectoris: Secondary | ICD-10-CM | POA: Diagnosis not present

## 2019-11-22 DIAGNOSIS — F419 Anxiety disorder, unspecified: Secondary | ICD-10-CM | POA: Diagnosis not present

## 2019-11-22 DIAGNOSIS — Z7902 Long term (current) use of antithrombotics/antiplatelets: Secondary | ICD-10-CM | POA: Diagnosis not present

## 2019-11-22 DIAGNOSIS — S32010A Wedge compression fracture of first lumbar vertebra, initial encounter for closed fracture: Secondary | ICD-10-CM | POA: Diagnosis not present

## 2019-11-22 DIAGNOSIS — M4856XA Collapsed vertebra, not elsewhere classified, lumbar region, initial encounter for fracture: Secondary | ICD-10-CM | POA: Diagnosis not present

## 2019-11-22 DIAGNOSIS — M810 Age-related osteoporosis without current pathological fracture: Secondary | ICD-10-CM | POA: Diagnosis not present

## 2019-11-30 ENCOUNTER — Other Ambulatory Visit: Payer: Self-pay

## 2019-12-01 MED ORDER — MORPHINE SULFATE ER 30 MG PO TBCR: 30.0000 mg | EXTENDED_RELEASE_TABLET | Freq: Three times a day (TID) | ORAL | 0 refills | Status: DC

## 2019-12-06 DIAGNOSIS — Z4789 Encounter for other orthopedic aftercare: Secondary | ICD-10-CM | POA: Diagnosis not present

## 2019-12-06 DIAGNOSIS — Z4800 Encounter for change or removal of nonsurgical wound dressing: Secondary | ICD-10-CM | POA: Diagnosis not present

## 2019-12-06 DIAGNOSIS — Z9889 Other specified postprocedural states: Secondary | ICD-10-CM | POA: Diagnosis not present

## 2019-12-19 ENCOUNTER — Other Ambulatory Visit: Payer: Self-pay | Admitting: Family Medicine

## 2019-12-20 ENCOUNTER — Other Ambulatory Visit: Payer: Self-pay | Admitting: Family Medicine

## 2019-12-20 ENCOUNTER — Ambulatory Visit (INDEPENDENT_AMBULATORY_CARE_PROVIDER_SITE_OTHER): Payer: Medicare Other

## 2019-12-20 DIAGNOSIS — Z23 Encounter for immunization: Secondary | ICD-10-CM | POA: Diagnosis not present
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
## 2019-12-26 ENCOUNTER — Ambulatory Visit: Payer: Medicare Other

## 2019-12-26 ENCOUNTER — Ambulatory Visit (INDEPENDENT_AMBULATORY_CARE_PROVIDER_SITE_OTHER): Payer: Medicare Other | Admitting: Family Medicine

## 2019-12-26 ENCOUNTER — Other Ambulatory Visit: Payer: Self-pay

## 2019-12-26 ENCOUNTER — Encounter: Payer: Self-pay | Admitting: Family Medicine

## 2019-12-26 VITALS — BP 110/70 | HR 86 | Temp 96.9°F | Ht 63.0 in | Wt 122.0 lb

## 2019-12-26 DIAGNOSIS — R5383 Other fatigue: Secondary | ICD-10-CM

## 2019-12-26 DIAGNOSIS — I10 Essential (primary) hypertension: Secondary | ICD-10-CM

## 2019-12-26 DIAGNOSIS — M546 Pain in thoracic spine: Secondary | ICD-10-CM | POA: Diagnosis not present

## 2019-12-26 DIAGNOSIS — M545 Low back pain, unspecified: Secondary | ICD-10-CM

## 2019-12-26 DIAGNOSIS — G8929 Other chronic pain: Secondary | ICD-10-CM

## 2019-12-26 DIAGNOSIS — M791 Myalgia, unspecified site: Secondary | ICD-10-CM | POA: Diagnosis not present

## 2019-12-26 DIAGNOSIS — E782 Mixed hyperlipidemia: Secondary | ICD-10-CM

## 2019-12-26 NOTE — Progress Notes (Signed)
Acute Office Visit  Subjective:    Patient ID: Donna Little, female    DOB: [birth year], 74 y.o.   MRN: [id number]  Chief Complaint  Patient presents with  . Generalized Body Aches    HPI Patient is in today for body aches x 3 weeks. Morphine nor tylenol have helped. Patient seems very uncomfortable while sitting. She is requesting a rx for prednisone states while she is on it she does not hurt.   Patient states her brother is currently hospitalized due to [facility] and her son is also positive for covid. Patient has not been in contact with either of them.  Past Medical History:  Diagnosis Date  . A-fib ([place])   . Chronic pain syndrome   . Chronic systolic heart failure ([place])   . Depression   . Generalized atherosclerosis   . GERD (gastroesophageal reflux disease)   . Hypertension   . IBS (irritable bowel syndrome)   . Melanoma ([place])   . Other specified nontoxic goiter   . Palpitations   . Polymyalgia rheumatica ([place])   . Primary pulmonary hypertension ([place])   . Pulmonary embolism ([place])   . Stroke ([place])    ministroke  . Supraventricular tachycardia ([place])   . Type 2 diabetes mellitus ([place])     Past Surgical History:  Procedure Laterality Date  . ABDOMINAL HYSTERECTOMY    . APPENDECTOMY    . CATARACT EXTRACTION    . CORONARY ANGIOPLASTY WITH STENT PLACEMENT      Family History  Problem Relation Age of Onset  . Diabetes Other   . Stroke Other   . Heart disease Other     Social History   Socioeconomic History  . Marital status: Single    Spouse name: Not on file  . Number of children: 2  . Years of education: Not on file  . Highest education level: Not on file  Occupational History  . Occupation: retired  Tobacco Use  . Smoking status: Former Research scientist (life sciences)  . Smokeless tobacco: Never Used  Substance and Sexual Activity  . Alcohol use: No    Alcohol/week: 0.0 standard drinks  . Drug use: No  . Sexual activity: Not on file  Other Topics Concern  . Not on file    Social History Narrative  . Not on file   Social Determinants of Health   Financial Resource Strain:   . Difficulty of Paying Living Expenses: Not on file  Food Insecurity: No Food Insecurity  . Worried About Charity fundraiser in the Last Year: Never true  . Ran Out of Food in the Last Year: Never true  Transportation Needs:   . Lack of Transportation (Medical): Not on file  . Lack of Transportation (Non-Medical): Not on file  Physical Activity:   . Days of Exercise per Week: Not on file  . Minutes of Exercise per Session: Not on file  Stress:   . Feeling of Stress : Not on file  Social Connections:   . Frequency of Communication with Friends and Family: Not on file  . Frequency of Social Gatherings with Friends and Family: Not on file  . Attends Religious Services: Not on file  . Active Member of Clubs or Organizations: Not on file  . Attends Archivist Meetings: Not on file  . Marital Status: Not on file  Intimate Partner Violence:   . Fear of Current or Ex-Partner: Not on file  . Emotionally Abused: Not on file  . Physically  Abused: Not on file  . Sexually Abused: Not on file    Outpatient Medications Prior to Visit  Medication Sig Dispense Refill  . albuterol (VENTOLIN HFA) 108 (90 Base) MCG/ACT inhaler Inhale 2 puffs into the lungs every 6 (six) hours as needed.     . citalopram (CELEXA) 20 MG tablet Take 1 tablet by mouth once daily 90 tablet 0  . clopidogrel (PLAVIX) 75 MG tablet Take 75 mg by mouth daily.    Marland Kitchen denosumab (PROLIA) 60 MG/ML SOSY injection Inject 60 mg into the skin every 6 (six) months.    . diclofenac Sodium (VOLTAREN) 1 % GEL Apply 2 g topically 4 (four) times daily as needed.     . dicyclomine (BENTYL) 10 MG capsule TAKE 1 CAPSULE BY MOUTH 4 TIMES DAILY BEFORE MEAL(S) AND AT BEDTIME 90 capsule 0  . Fluticasone-Umeclidin-Vilant (TRELEGY ELLIPTA) 100-62.5-25 MCG/INH AEPB INHALE 1 PUFF ONCE DAILY USE AT THE SAME TIME EACH DAY 1 each 1  .  furosemide (LASIX) 40 MG tablet Take 2 tablets by mouth twice daily 360 tablet 0  . gabapentin (NEURONTIN) 800 MG tablet Take 1 tablet (800 mg total) by mouth 3 (three) times daily. 90 tablet 3  . meclizine (ANTIVERT) 25 MG tablet Take 25 mg by mouth 3 (three) times daily as needed.     . metoprolol succinate (TOPROL-XL) 25 MG 24 hr tablet Take 25 mg by mouth in the morning and at bedtime.     Marland Kitchen morphine (MS CONTIN) 30 MG 12 hr tablet Take 1 tablet (30 mg total) by mouth in the morning, at noon, and at bedtime. 90 tablet 0  . nitroGLYCERIN (NITROSTAT) 0.4 MG SL tablet Place under the tongue.    . ondansetron (ZOFRAN-ODT) 4 MG disintegrating tablet Take 4 mg by mouth every 8 (eight) hours as needed.     . pantoprazole (PROTONIX) 40 MG tablet Take 1 tablet by mouth once daily 90 tablet 1  . Potassium Chloride ER 20 MEQ TBCR Take 1 tablet by mouth twice daily with food 180 tablet 0  . pravastatin (PRAVACHOL) 20 MG tablet Take 1 tablet by mouth once daily 90 tablet 0  . EC-NAPROXEN 500 MG EC tablet  (Patient not taking: Reported on 12/26/2019)    . furosemide (LASIX) 20 MG tablet Take 40 mg by mouth 2 (two) times daily.  (Patient not taking: Reported on 12/26/2019)    . metoprolol tartrate (LOPRESSOR) 25 MG tablet TAKE 1/2 TO 1 (ONE-HALF TO ONE) TABLET BY MOUTH TWICE DAILY (Patient not taking: Reported on 12/26/2019)    . predniSONE (DELTASONE) 50 MG tablet Take 1 tablet (50 mg total) by mouth daily with breakfast. (Patient not taking: Reported on 12/26/2019) 10 tablet 0  . venlafaxine XR (EFFEXOR-XR) 75 MG 24 hr capsule TAKE 1 CAPSULE BY MOUTH ONCE DAILY FOR 7 DAYS THEN  INCREASE  TO  2  CAPSULES  DAILY (Patient not taking: Reported on 12/26/2019) 49 capsule 0   No facility-administered medications prior to visit.    Allergies  Allergen Reactions  . Codeine   . Methocarbamol   . Sertraline     Insomnia   . Tizanidine     Review of Systems  Constitutional: Positive for fatigue. Negative for  chills and fever.  HENT: Negative for congestion, ear pain, rhinorrhea and sore throat.   Respiratory: Positive for shortness of breath.   Cardiovascular: Negative for chest pain.  Gastrointestinal: Negative for abdominal pain, constipation, diarrhea, nausea and vomiting.  Genitourinary:  Negative for dysuria and urgency.  Musculoskeletal: Positive for arthralgias, back pain, myalgias and neck pain.  Neurological: Positive for weakness. Negative for dizziness and headaches.  Psychiatric/Behavioral: Positive for dysphoric mood. The patient is nervous/anxious.        Objective:    Physical Exam Vitals reviewed.  Constitutional:      Appearance: Normal appearance. She is normal weight.  Cardiovascular:     Rate and Rhythm: Normal rate and regular rhythm.     Pulses: Normal pulses.     Heart sounds: Normal heart sounds.  Pulmonary:     Effort: Pulmonary effort is normal. No respiratory distress.     Breath sounds: Normal breath sounds.  Abdominal:     General: Abdomen is flat. Bowel sounds are normal.     Palpations: Abdomen is soft.     Tenderness: There is no abdominal tenderness.  Musculoskeletal:     Comments: Antalgic gait. Stooped. Tender lumbar spine. Tender thoracic spine. No FM trigger points.   Neurological:     Mental Status: She is alert and oriented to person, place, and time.  Psychiatric:        Mood and Affect: Mood normal.        Behavior: Behavior normal.     Pulse 86   Temp (!) 96.9 F (36.1 C)   Ht 5\' 3"  (1.6 m)   Wt 122 lb (55.3 kg)   SpO2 93%   BMI 21.61 kg/m  Wt Readings from Last 3 Encounters:  12/26/19 122 lb (55.3 kg)  10/05/19 132 lb (59.9 kg)  09/21/19 132 lb (59.9 kg)    Health Maintenance Due  Topic Date Due  . Hepatitis C Screening  Never done  . URINE MICROALBUMIN  Never done  . COVID-19 Vaccine (1) Never done  . TETANUS/TDAP  Never done  . PNA vac Low Risk Adult (2 of 2 - PCV13) 05/28/2019    There are no preventive care  reminders to display for this patient.   Lab Results  Component Value Date   TSH 1.050 12/26/2019   Lab Results  Component Value Date   WBC 7.5 12/26/2019   HGB 13.5 12/26/2019   HCT 41.3 12/26/2019   MCV 94 12/26/2019   PLT 236 12/26/2019   Lab Results  Component Value Date   NA 138 12/26/2019   K 4.8 12/26/2019   CO2 27 12/26/2019   GLUCOSE 88 12/26/2019   BUN 17 12/26/2019   CREATININE 0.77 12/26/2019   BILITOT <0.2 12/26/2019   ALKPHOS 125 (H) 12/26/2019   AST 16 12/26/2019   ALT 8 12/26/2019   PROT 6.8 12/26/2019   ALBUMIN 3.9 12/26/2019   CALCIUM 9.6 12/26/2019   No results found for: CHOL No results found for: HDL No results found for: LDLCALC No results found for: TRIG No results found for: CHOLHDL No results found for: HGBA1C     Assessment & Plan:  1. Muscle pain - Sedimentation rate  2. Other fatigue - Sedimentation rate - CBC with Differential/Platelet - Comprehensive metabolic panel - TSH  3. Chronic midline thoracic back pain The current medical regimen is partially effective;  continue present plan and medications.  4. Chronic midline low back pain without sciatica The current medical regimen is partially effective;  continue present plan and medications.   Follow-up: Return in about 3 weeks (around 01/16/2020) for fasting.  An After Visit Summary was printed and given to the patient.  [name] [phone]

## 2019-12-26 NOTE — Patient Instructions (Signed)
Visit Information  Goals Addressed            This Visit's Progress   . Pharmacy Care Plan       CARE PLAN ENTRY (see longitudinal plan of care for additional care plan information)  Current Barriers:  . Chronic Disease Management support, education, and care coordination needs related to Hypertension, Hyperlipidemia, and COPD   Hypertension BP Readings from Last 3 Encounters:  10/05/19 110/60  09/21/19 120/60  08/15/19 (!) 108/52   . Pharmacist Clinical Goal(s): o Over the next 90 days, patient will work with PharmD and providers to maintain BP goal <140/90 . Current regimen:  o Furosemide 80 mg bid . Interventions: o Reviewed medication list.  o Recommend patient work on improving diet and avoid salting food.  . Patient self care activities - Over the next 90 days, patient will: o Ensure daily salt intake < 2300 mg/day  Hyperlipidemia No results found for: LDLCALC, LDLDIRECT . Pharmacist Clinical Goal(s): o Over the next 90 days, patient will work with PharmD and providers to maintain LDL goal < 100 . Current regimen:  o Pravastatin 20 mg daily  . Interventions: o Recommend patient watch diet and focus on incorporating lean meats and vegetables.  . Patient self care activities - Over the next 90 days, patient will: o Continue to take pravastatin as precribed.   Depression . Pharmacist Clinical Goal(s): o Over the next 90 days, patient will work with PharmD and providers to achieve goal of improved management of symptoms of crying . Current regimen:  o N/a . Interventions: o Continue taking Effexor as prescribed. Will discuss possible increase in dosage at next visit.  . Patient self care activities - Over the next 90 days, patient will: o Contact provider with any concerns of questions  COPD . Pharmacist Clinical Goal(s) o Over the next 90 days, patient will work with PharmD and providers to reduce risk of COPD flares.  . Current regimen:  o Albuterol prn   . Interventions: o Continue Trelegy inhaler daily for prevention of shortness of breath or wheezing.  o Discussed eligibility for Trelegy patient assistance. If not eligible, could consider Breztri.  . Patient self care activities - Over the next 90 days, patient will: o Take Trelegy daily as prescribed.   Medication management . Pharmacist Clinical Goal(s): o Over the next 90 days, patient will work with PharmD and providers to achieve optimal medication adherence . Current pharmacy: [facility] . Interventions o Comprehensive medication review performed. o Continue current medication management strategy . Patient self care activities - Over the next 90 days, patient will: o Focus on medication adherence by continuing to use pill box.  o Take medications as prescribed o Report any questions or concerns to PharmD and/or provider(s)  Please see past updates related to this goal by clicking on the "Past Updates" button in the selected goal         The patient verbalized understanding of instructions provided today and declined a print copy of patient instruction materials.   Telephone follow up appointment with pharmacy team member scheduled for:03/2020  Sherre Poot, PharmD, [facility] Clinical Pharmacist [facility] [phone] (office) [phone] (mobile)   Managing Pain Without Opioids Opioids are strong medicines used to treat moderate to severe pain. For some people, especially those who have long-term (chronic) pain, opioids may not be the best choice for pain management due to:  Side effects like nausea, constipation, and sleepiness.  The risk of addiction (  opioid use disorder). The longer you take opioids, the greater your risk of addiction. Pain that lasts for more than 3 months is called chronic pain. Managing chronic pain usually requires more than one approach and is often provided by a team of health care providers working together  (multidisciplinary approach). Pain management may be done at a pain management center or pain clinic. Types of pain management without opioids Managing pain without opioids can involve:  Non-opioid medicines.  Exercises to help relieve pain and improve strength and range of motion (physical therapy).  Therapy to help with everyday tasks and activities (occupational therapy).  Therapy to help you find ways to relieve pain by doing things you enjoy (recreational therapy).  Talk therapy (psychotherapy) and other mental health therapies.  Medical treatments such as injections or devices.  Making lifestyle changes. Pain management options Non-opioid medicines Non-opioid medicines for pain may include medicines taken by mouth (oral medicines), such as:  Over-the-counter or prescription NSAIDs. These may be the first medicines used for pain. They work well for muscle and bone pain, and they reduce swelling.  Acetaminophen. This over-the-counter medicine may work well for milder pain but not swelling.  Antidepressants. These may be used to treat chronic pain. A certain type of antidepressant (tricyclics) is often used. These medicines are given in lower doses for pain than when used for depression.  Anticonvulsants. These are usually used to treat seizures but may also reduce nerve (neuropathic) pain.  Muscle relaxants. These relieve pain caused by sudden muscle tightening (spasms). You may also use a type of pain medicine that is applied to the skin as a patch, cream, or gel (topical analgesic), such as a numbing medicine. These may cause fewer side effects than oral medicines. Therapy Physical therapy involves doing exercises to gain strength and flexibility. A physical therapist may teach you exercises to move and stretch parts of your body that are weak, stiff, or painful. You can learn these exercises at physical therapy visits and practice them at home. Physical therapy may also  involve:  Massage.  Heat wraps or applying heat or cold to affected areas.  Sending electrical signals through the skin to interrupt pain signals (transcutaneous electrical nerve stimulation, TENS).  Sending weak lasers through the skin to reduce pain and swelling (low-level laser therapy).  Using signals from your body to help you learn to regulate pain (biofeedback). Occupational therapy helps you learn ways to function at home and work with less pain. Recreational therapy may involve trying new activities or hobbies, such as drawing or a physical activity. Types of mental health therapy for pain include:  Cognitive behavioral therapy (CBT) to help you learn coping skills for dealing with pain.  Acceptance and commitment therapy (ACT) to change the way you think and react to pain.  Relaxation therapies, including muscle relaxation exercises and focusing your mind on the present moment to lower stress (mindfulness-based stress reduction).  Pain management counseling. This may be individual, family, or group counseling.  Medical treatments Medical treatments for pain management include:  Nerve block injections. These may include a pain blocker and anti-inflammatory medicines. You may have injections: ? Near the spine to relieve chronic back or neck pain. ? Into joints to relieve back or joint pain. ? Into nerve areas that supply a painful area to relieve body pain. ? Into muscles (trigger point injections) to relieve some painful muscle conditions.  A medical device placed near your spine to help block pain signals and relieve nerve  pain or chronic back pain (spinal cord stimulation device).  Acupuncture. Follow these instructions at home Medicines  Take over-the-counter and prescription medicines only as told by your health care provider.  If you are taking pain medicine, ask your health care providers about possible side effects to watch out for.  Do not drive or use heavy  machinery while taking prescription pain medicine. Lifestyle   Do not use drugs or alcohol to reduce pain. Limit alcohol intake to no more than 1 drink a day for nonpregnant women and 2 drinks a day for men. One drink equals 12 oz of beer, 5 oz of wine, or 1 oz of hard liquor.  Do not use any products that contain nicotine or tobacco, such as cigarettes and e-cigarettes. These can delay healing. If you need help quitting, ask your health care provider.  Eat a healthy diet and maintain a healthy weight. Poor diet and excess weight may make pain worse. ? Eat foods that are high in fiber. These include fresh fruits and vegetables, whole grains, and beans. ? Limit foods that are high in fat and processed sugars, such as fried and sweet foods.  Exercise regularly. Exercise lowers stress and may help relieve pain. ? Ask your health care provider what activities and exercises are safe for you. ? If your health care provider approves, join an exercise class that combines movement and stress reduction. Examples include yoga and tai chi.  Get enough sleep. Lack of sleep may make pain worse.  Lower stress as much as possible. Practice stress reduction techniques as told by your therapist. General instructions  Work with all your pain management providers to find the treatments that work best for you. You are an important member of your pain management team. There are many things you can do to reduce pain on your own.  Consider joining an online or in-person support group for people who have chronic pain.  Keep all follow-up visits as told by your health care providers. This is important. Where to find more information You can find more information about managing pain without opioids from:  American Academy of Pain Medicine: painmed.[place] for Chronic Pain: instituteforchronicpain.org  American Chronic Pain Association: theacpa.org Contact a health care provider if:  You have side  effects from pain medicine.  Your pain gets worse or does not get better with treatments or home care.  You are struggling with anxiety or depression. Summary  Many types of pain can be managed without opioids. Chronic pain may respond better to pain management without opioids.  Pain is best managed with a team of providers working together.  Pain management without opioids may include non-opioid medicines, medical treatments, physical therapy, mental health therapy, and lifestyle changes.  Tell your health care providers if your pain gets worse or is not being managed well enough. This information is not intended to replace advice given to you by your health care provider. Make sure you discuss any questions you have with your health care provider. Document Revised: 12/14/2018 Document Reviewed: 01/12/2017 Elsevier Patient Education  [place].

## 2019-12-27 ENCOUNTER — Encounter: Payer: Self-pay | Admitting: Family Medicine

## 2019-12-27 LAB — CBC WITH DIFFERENTIAL/PLATELET
Basophils Absolute: 0.1 10*3/uL (ref 0.0–0.2)
Basos: 1 %
EOS (ABSOLUTE): 0.2 10*3/uL (ref 0.0–0.4)
Eos: 3 %
Hematocrit: 41.3 % (ref 34.0–46.6)
Hemoglobin: 13.5 g/dL (ref 11.1–15.9)
Immature Grans (Abs): 0 10*3/uL (ref 0.0–0.1)
Immature Granulocytes: 0 %
Lymphocytes Absolute: 1.8 10*3/uL (ref 0.7–3.1)
Lymphs: 24 %
MCH: 30.6 pg (ref 26.6–33.0)
MCHC: 32.7 g/dL (ref 31.5–35.7)
MCV: 94 fL (ref 79–97)
Monocytes Absolute: 0.6 10*3/uL (ref 0.1–0.9)
Monocytes: 8 %
Neutrophils Absolute: 4.7 10*3/uL (ref 1.4–7.0)
Neutrophils: 64 %
Platelets: 236 10*3/uL (ref 150–450)
RBC: 4.41 x10E6/uL (ref 3.77–5.28)
RDW: 13.1 % (ref 11.7–15.4)
WBC: 7.5 10*3/uL (ref 3.4–10.8)

## 2019-12-27 LAB — COMPREHENSIVE METABOLIC PANEL
ALT: 8 IU/L (ref 0–32)
AST: 16 IU/L (ref 0–40)
Albumin/Globulin Ratio: 1.3 (ref 1.2–2.2)
Albumin: 3.9 g/dL (ref 3.7–4.7)
Alkaline Phosphatase: 125 IU/L — ABNORMAL HIGH (ref 44–121)
BUN/Creatinine Ratio: 22 (ref 12–28)
BUN: 17 mg/dL (ref 8–27)
Bilirubin Total: <0.2 mg/dL (ref 0.0–1.2)
CO2: 27 mmol/L (ref 20–29)
Calcium: 9.6 mg/dL (ref 8.7–10.3)
Chloride: 97 mmol/L (ref 96–106)
Creatinine, Ser: 0.77 mg/dL (ref 0.57–1.00)
GFR calc Af Amer: 87 mL/min/{1.73_m2} (ref >59)
GFR calc non Af Amer: 75 mL/min/{1.73_m2} (ref >59)
Globulin, Total: 2.9 g/dL (ref 1.5–4.5)
Glucose: 88 mg/dL (ref 65–99)
Potassium: 4.8 mmol/L (ref 3.5–5.2)
Sodium: 138 mmol/L (ref 134–144)
Total Protein: 6.8 g/dL (ref 6.0–8.5)

## 2019-12-27 LAB — SEDIMENTATION RATE: Sed Rate: 24 mm/hr (ref 0–40)

## 2019-12-27 LAB — TSH: TSH: 1.05 u[IU]/mL (ref 0.450–4.500)

## 2019-12-28 ENCOUNTER — Other Ambulatory Visit: Payer: Self-pay

## 2019-12-28 ENCOUNTER — Ambulatory Visit (INDEPENDENT_AMBULATORY_CARE_PROVIDER_SITE_OTHER): Payer: Medicare Other

## 2019-12-28 DIAGNOSIS — Z23 Encounter for immunization: Secondary | ICD-10-CM

## 2019-12-28 NOTE — Progress Notes (Signed)
   Covid-19 Vaccination Clinic  Name:  Donna Little    MRN: [id number] DOB: May 14, 1945  12/28/2019  Donna Little was observed post Covid-19 immunization for 15 minutes without incident. She was provided with Vaccine Information Sheet and instruction to access the V-Safe system.   Donna Little was instructed to call 911 with any severe reactions post vaccine: Marland Kitchen Difficulty breathing  . Swelling of face and throat  . A fast heartbeat  . A bad rash all over body  . Dizziness and weakness   Immunizations Administered    Name Date Dose VIS Date Route   Pfizer COVID-19 Vaccine 12/28/2019 10:30 AM 0.3 mL 05/31/2018 Intramuscular   Manufacturer: [place]   Lot: [id number]   [place]: [id number]

## 2020-01-03 ENCOUNTER — Other Ambulatory Visit: Payer: Self-pay | Admitting: Family Medicine

## 2020-01-03 MED ORDER — MORPHINE SULFATE ER 30 MG PO TBCR
30.0000 mg | EXTENDED_RELEASE_TABLET | Freq: Three times a day (TID) | ORAL | 0 refills | Status: DC
Start: 2020-01-03 — End: 2020-02-05

## 2020-01-04 DIAGNOSIS — R29890 Loss of height: Secondary | ICD-10-CM | POA: Diagnosis not present

## 2020-01-04 DIAGNOSIS — M549 Dorsalgia, unspecified: Secondary | ICD-10-CM | POA: Diagnosis not present

## 2020-01-04 DIAGNOSIS — M47816 Spondylosis without myelopathy or radiculopathy, lumbar region: Secondary | ICD-10-CM | POA: Diagnosis not present

## 2020-01-04 DIAGNOSIS — M545 Low back pain: Secondary | ICD-10-CM | POA: Diagnosis not present

## 2020-01-04 DIAGNOSIS — Z4789 Encounter for other orthopedic aftercare: Secondary | ICD-10-CM | POA: Diagnosis not present

## 2020-01-04 DIAGNOSIS — M439 Deforming dorsopathy, unspecified: Secondary | ICD-10-CM | POA: Diagnosis not present

## 2020-01-04 DIAGNOSIS — Z981 Arthrodesis status: Secondary | ICD-10-CM | POA: Diagnosis not present

## 2020-01-04 DIAGNOSIS — I999 Unspecified disorder of circulatory system: Secondary | ICD-10-CM | POA: Diagnosis not present

## 2020-01-04 DIAGNOSIS — S22089D Unspecified fracture of T11-T12 vertebra, subsequent encounter for fracture with routine healing: Secondary | ICD-10-CM | POA: Diagnosis not present

## 2020-01-04 DIAGNOSIS — S32059A Unspecified fracture of fifth lumbar vertebra, initial encounter for closed fracture: Secondary | ICD-10-CM | POA: Diagnosis not present

## 2020-01-04 DIAGNOSIS — Z9889 Other specified postprocedural states: Secondary | ICD-10-CM | POA: Diagnosis not present

## 2020-01-15 DIAGNOSIS — H40013 Open angle with borderline findings, low risk, bilateral: Secondary | ICD-10-CM | POA: Diagnosis not present

## 2020-01-15 LAB — HM DIABETES EYE EXAM

## 2020-01-16 ENCOUNTER — Ambulatory Visit (INDEPENDENT_AMBULATORY_CARE_PROVIDER_SITE_OTHER): Payer: Medicare Other | Admitting: Family Medicine

## 2020-01-16 ENCOUNTER — Encounter: Payer: Self-pay | Admitting: Family Medicine

## 2020-01-16 ENCOUNTER — Other Ambulatory Visit: Payer: Self-pay

## 2020-01-16 VITALS — BP 144/68 | HR 74 | Temp 97.6°F | Resp 18 | Ht 63.0 in | Wt 125.6 lb

## 2020-01-16 DIAGNOSIS — G8929 Other chronic pain: Secondary | ICD-10-CM

## 2020-01-16 DIAGNOSIS — I110 Hypertensive heart disease with heart failure: Secondary | ICD-10-CM

## 2020-01-16 DIAGNOSIS — F330 Major depressive disorder, recurrent, mild: Secondary | ICD-10-CM

## 2020-01-16 DIAGNOSIS — E1169 Type 2 diabetes mellitus with other specified complication: Secondary | ICD-10-CM | POA: Diagnosis not present

## 2020-01-16 DIAGNOSIS — M546 Pain in thoracic spine: Secondary | ICD-10-CM | POA: Diagnosis not present

## 2020-01-16 DIAGNOSIS — E782 Mixed hyperlipidemia: Secondary | ICD-10-CM | POA: Diagnosis not present

## 2020-01-16 DIAGNOSIS — I679 Cerebrovascular disease, unspecified: Secondary | ICD-10-CM

## 2020-01-16 DIAGNOSIS — M545 Low back pain, unspecified: Secondary | ICD-10-CM | POA: Diagnosis not present

## 2020-01-16 NOTE — Progress Notes (Signed)
Subjective:  Patient ID: Donna Little, female    DOB: 12/02/1945  Age: 74 y.o. MRN: [id number]  Chief Complaint  Patient presents with  . Hyperlipidemia  . Hypertension    HPI Chronic pain syndrome: Pt had kyphoplasty, which did not help. Pt is only comfortable if she is leaning over at 90 degree flexion of waist. On gabapentin, Ms contin 30 mg one three times a day. Pain is 7/10 sitting. With walking pain increases to 8/10. Patient has not gotten a wheelchair since her last visit.    Diabetes. well controlled. Poor appetite. 2 meals per day. Unable to exercise.   Hyperlipidemia and hypertension with stroke. Pt is currently on plavix, pravastatin, metoprolol, and furosemide.   Osteoporosis. Pt is on prolia.  Patient has calcium with vitamin d, but has not started.   Hypokalemia: On potassium chloride daily.   Depression: citalopram 20 mg once daily.   COPD: Breathing good. On trelegy one inhalation daily Using ventolin twice a week.   Current Outpatient Medications on File Prior to Visit  Medication Sig Dispense Refill  . albuterol (VENTOLIN HFA) 108 (90 Base) MCG/ACT inhaler Inhale 2 puffs into the lungs every 6 (six) hours as needed.     . citalopram (CELEXA) 20 MG tablet Take 1 tablet by mouth once daily 90 tablet 0  . clopidogrel (PLAVIX) 75 MG tablet Take 75 mg by mouth daily.    Marland Kitchen denosumab (PROLIA) 60 MG/ML SOSY injection Inject 60 mg into the skin every 6 (six) months.    . diclofenac Sodium (VOLTAREN) 1 % GEL Apply 2 g topically 4 (four) times daily as needed.     . dicyclomine (BENTYL) 10 MG capsule TAKE 1 CAPSULE BY MOUTH 4 TIMES DAILY BEFORE MEAL(S) AND AT BEDTIME 90 capsule 0  . Fluticasone-Umeclidin-Vilant (TRELEGY ELLIPTA) 100-62.5-25 MCG/INH AEPB INHALE 1 PUFF ONCE DAILY USE AT THE SAME TIME EACH DAY 1 each 1  . furosemide (LASIX) 40 MG tablet Take 2 tablets by mouth twice daily 360 tablet 0  . gabapentin (NEURONTIN) 800 MG tablet TAKE 1 TABLET BY MOUTH THREE  TIMES DAILY 90 tablet 0  . meclizine (ANTIVERT) 25 MG tablet Take 25 mg by mouth 3 (three) times daily as needed.     . metoprolol succinate (TOPROL-XL) 25 MG 24 hr tablet Take 25 mg by mouth in the morning and at bedtime.     Marland Kitchen morphine (MS CONTIN) 30 MG 12 hr tablet Take 1 tablet (30 mg total) by mouth in the morning, at noon, and at bedtime. 90 tablet 0  . nitroGLYCERIN (NITROSTAT) 0.4 MG SL tablet Place under the tongue.    . ondansetron (ZOFRAN-ODT) 4 MG disintegrating tablet Take 4 mg by mouth every 8 (eight) hours as needed.     . pantoprazole (PROTONIX) 40 MG tablet Take 1 tablet by mouth once daily 90 tablet 1  . Potassium Chloride ER 20 MEQ TBCR Take 1 tablet by mouth twice daily with food 180 tablet 0  . pravastatin (PRAVACHOL) 20 MG tablet Take 1 tablet by mouth once daily 90 tablet 0   No current facility-administered medications on file prior to visit.   Past Medical History:  Diagnosis Date  . A-fib ([place])   . Chronic pain syndrome   . Chronic systolic heart failure ([place])   . Depression   . Generalized atherosclerosis   . GERD (gastroesophageal reflux disease)   . Hypertension   . IBS (irritable bowel syndrome)   . Melanoma ([place])   .  Other specified nontoxic goiter   . Palpitations   . Polymyalgia rheumatica ([place])   . Primary pulmonary hypertension ([place])   . Pulmonary embolism ([place])   . Stroke ([place])    ministroke  . Supraventricular tachycardia ([place])   . Type 2 diabetes mellitus ([place])    Past Surgical History:  Procedure Laterality Date  . ABDOMINAL HYSTERECTOMY    . APPENDECTOMY    . CATARACT EXTRACTION    . CORONARY ANGIOPLASTY WITH STENT PLACEMENT      Family History  Problem Relation Age of Onset  . Diabetes Other   . Stroke Other   . Heart disease Other    Social History   Socioeconomic History  . Marital status: Single    Spouse name: Not on file  . Number of children: 2  . Years of education: Not on file  . Highest education level: Not on file   Occupational History  . Occupation: retired  Tobacco Use  . Smoking status: Former Smoker    Packs/day: 0.50    Years: 50.00    Pack years: 25.00  . Smokeless tobacco: Never Used  Substance and Sexual Activity  . Alcohol use: No    Alcohol/week: 0.0 standard drinks  . Drug use: No  . Sexual activity: Not on file  Other Topics Concern  . Not on file  Social History Narrative  . Not on file   Social Determinants of Health   Financial Resource Strain:   . Difficulty of Paying Living Expenses: Not on file  Food Insecurity: No Food Insecurity  . Worried About Charity fundraiser in the Last Year: Never true  . Ran Out of Food in the Last Year: Never true  Transportation Needs:   . Lack of Transportation (Medical): Not on file  . Lack of Transportation (Non-Medical): Not on file  Physical Activity:   . Days of Exercise per Week: Not on file  . Minutes of Exercise per Session: Not on file  Stress:   . Feeling of Stress : Not on file  Social Connections:   . Frequency of Communication with Friends and Family: Not on file  . Frequency of Social Gatherings with Friends and Family: Not on file  . Attends Religious Services: Not on file  . Active Member of Clubs or Organizations: Not on file  . Attends Archivist Meetings: Not on file  . Marital Status: Not on file    Review of Systems  Constitutional: Negative for chills, fatigue and fever.  HENT: Positive for sinus pressure. Negative for congestion, ear pain, rhinorrhea and sore throat.   Respiratory: Positive for cough and shortness of breath.   Cardiovascular: Negative for chest pain, palpitations and leg swelling.  Gastrointestinal: Negative for abdominal pain, constipation, diarrhea, nausea and vomiting.  Endocrine: Negative for polydipsia, polyphagia and polyuria.  Genitourinary: Negative for dysuria and urgency.  Musculoskeletal: Positive for back pain. Negative for myalgias.  Neurological: Positive for  weakness. Negative for dizziness, light-headedness and headaches.  Psychiatric/Behavioral: Positive for dysphoric mood. The patient is not nervous/anxious.    Objective:  BP (!) 144/68   Pulse 74   Temp 97.6 F (36.4 C)   Resp 18   Ht 5\' 3"  (1.6 m)   Wt 125 lb 9.6 oz (57 kg)   BMI 22.25 kg/m   BP/Weight 01/16/2020 7/82/9562 04/09/863  Systolic BP 784 696 295  Diastolic BP 68 70 60  Wt. (Lbs) 125.6 122 132  BMI 22.25 21.61 23.38  Physical Exam Vitals reviewed.  Constitutional:      Appearance: Normal appearance. She is normal weight.  Neck:     Vascular: No carotid bruit.  Cardiovascular:     Rate and Rhythm: Normal rate and regular rhythm.     Pulses: Normal pulses.     Heart sounds: Normal heart sounds.  Pulmonary:     Effort: Pulmonary effort is normal. No respiratory distress.     Breath sounds: Normal breath sounds.  Abdominal:     General: Abdomen is flat. Bowel sounds are normal.     Palpations: Abdomen is soft.     Tenderness: There is no abdominal tenderness.  Musculoskeletal:        General: Tenderness (lumbar. flexed at lumbar spine. ) present.  Neurological:     Mental Status: She is alert and oriented to person, place, and time.  Psychiatric:        Mood and Affect: Mood normal.        Behavior: Behavior normal.    Diabetic Foot Exam - Simple   No data filed       Lab Results  Component Value Date   WBC 7.5 12/26/2019   HGB 13.5 12/26/2019   HCT 41.3 12/26/2019   PLT 236 12/26/2019   GLUCOSE 86 01/16/2020   CHOL 157 01/16/2020   TRIG 136 01/16/2020   HDL 35 (L) 01/16/2020   LDLCALC 98 01/16/2020   ALT 8 01/16/2020   AST 20 01/16/2020   NA 139 01/16/2020   K 4.5 01/16/2020   CL 100 01/16/2020   CREATININE 0.77 01/16/2020   BUN 13 01/16/2020   CO2 27 01/16/2020   TSH 1.050 12/26/2019   HGBA1C 5.8 (H) 01/16/2020      Assessment & Plan:   1. Combined hyperlipidemia associated with type 2 diabetes mellitus (HCC) Control:  good Recommend check feet daily. Recommend annual eye exams. Medicines:continue same dose. Continue to work on eating a healthy diet and exercise.  Labs drawn today.   - Hemoglobin A1c - POCT UA - Microalbumin  2. Mild recurrent major depression (HCC) The current medical regimen is effective;  continue present plan and medications.  3. Hypertensive heart disease with heart failure ([place]) Well controlled.  No changes to medicines.  Continue to work on eating a healthy diet and exercise.  Labs drawn today.   4. Cerebrovascular disease The current medical regimen is effective;  continue present plan and medications.  5. Mixed hyperlipidemia Well controlled.  No changes to medicines.  Continue to work on eating a healthy diet and exercise.  Labs drawn today.  - Lipid panel - Comprehensive metabolic panel  6. Chronic midline thoracic back pain The current medical regimen is effective;  continue present plan and medications.  7. Chronic midline low back pain without sciatica  The current medical regimen is effective;  continue present plan and medications.  Orders Placed This Encounter  Procedures  . Hemoglobin A1c  . Lipid panel  . Comprehensive metabolic panel  . Cardiovascular Risk Assessment  . POCT UA - Microalbumin     Follow-up: Return in about 2 months (around 03/17/2020) for AWV WITH ME.Marland Kitchen  An After Visit Summary was printed and given to the patient.  [name] [facility] [phone]

## 2020-01-17 LAB — COMPREHENSIVE METABOLIC PANEL
ALT: 8 IU/L (ref 0–32)
AST: 20 IU/L (ref 0–40)
Albumin/Globulin Ratio: 1.4 (ref 1.2–2.2)
Albumin: 4.2 g/dL (ref 3.7–4.7)
Alkaline Phosphatase: 123 IU/L — ABNORMAL HIGH (ref 44–121)
BUN/Creatinine Ratio: 17 (ref 12–28)
BUN: 13 mg/dL (ref 8–27)
Bilirubin Total: 0.3 mg/dL (ref 0.0–1.2)
CO2: 27 mmol/L (ref 20–29)
Calcium: 8.9 mg/dL (ref 8.7–10.3)
Chloride: 100 mmol/L (ref 96–106)
Creatinine, Ser: 0.77 mg/dL (ref 0.57–1.00)
GFR calc Af Amer: 87 mL/min/{1.73_m2} (ref >59)
GFR calc non Af Amer: 75 mL/min/{1.73_m2} (ref >59)
Globulin, Total: 3 g/dL (ref 1.5–4.5)
Glucose: 86 mg/dL (ref 65–99)
Potassium: 4.5 mmol/L (ref 3.5–5.2)
Sodium: 139 mmol/L (ref 134–144)
Total Protein: 7.2 g/dL (ref 6.0–8.5)

## 2020-01-17 LAB — HEMOGLOBIN A1C
Est. average glucose Bld gHb Est-mCnc: 120 mg/dL
Hgb A1c MFr Bld: 5.8 % — ABNORMAL HIGH (ref 4.8–5.6)

## 2020-01-17 LAB — LIPID PANEL
Chol/HDL Ratio: 4.5 ratio — ABNORMAL HIGH (ref 0.0–4.4)
Cholesterol, Total: 157 mg/dL (ref 100–199)
HDL: 35 mg/dL — ABNORMAL LOW (ref >39)
LDL Chol Calc (NIH): 98 mg/dL (ref 0–99)
Triglycerides: 136 mg/dL (ref 0–149)
VLDL Cholesterol Cal: 24 mg/dL (ref 5–40)

## 2020-01-17 LAB — CARDIOVASCULAR RISK ASSESSMENT

## 2020-01-20 LAB — POCT UA - MICROALBUMIN: Microalbumin Ur, POC: 30 mg/L

## 2020-01-22 ENCOUNTER — Other Ambulatory Visit: Payer: Self-pay | Admitting: Family Medicine

## 2020-01-22 ENCOUNTER — Other Ambulatory Visit: Payer: Self-pay | Admitting: Physician Assistant

## 2020-01-31 ENCOUNTER — Telehealth: Payer: Self-pay

## 2020-01-31 ENCOUNTER — Other Ambulatory Visit: Payer: Self-pay | Admitting: Family Medicine

## 2020-01-31 MED ORDER — FLUCONAZOLE 150 MG PO TABS: 150.0000 mg | ORAL_TABLET | Freq: Once | ORAL | 0 refills | Status: AC

## 2020-01-31 NOTE — Telephone Encounter (Signed)
Fluconazole sent

## 2020-01-31 NOTE — Telephone Encounter (Signed)
Donna Little called with a scant vaginal discharge but no itching or burning.  She is convinced that she has a yeast infection.  She is requesting that you send something into the pharmacy.

## 2020-02-01 ENCOUNTER — Ambulatory Visit (INDEPENDENT_AMBULATORY_CARE_PROVIDER_SITE_OTHER): Payer: Medicare Other

## 2020-02-01 DIAGNOSIS — M810 Age-related osteoporosis without current pathological fracture: Secondary | ICD-10-CM | POA: Diagnosis not present

## 2020-02-01 MED ORDER — DENOSUMAB 60 MG/ML ~~LOC~~ SOSY
60.0000 mg | PREFILLED_SYRINGE | Freq: Once | SUBCUTANEOUS | Status: AC
  Administered 2020-02-01: 60 mg via SUBCUTANEOUS

## 2020-02-01 NOTE — Telephone Encounter (Signed)
Patient notified to pick up RX.

## 2020-02-01 NOTE — Progress Notes (Signed)
Donna Little comes in for her 6 month Prolia injection.

## 2020-02-05 ENCOUNTER — Other Ambulatory Visit: Payer: Self-pay

## 2020-02-05 MED ORDER — MORPHINE SULFATE ER 30 MG PO TBCR
30.0000 mg | EXTENDED_RELEASE_TABLET | Freq: Three times a day (TID) | ORAL | 0 refills | Status: DC
Start: 2020-02-05 — End: 2020-03-05

## 2020-02-05 MED ORDER — PRAVASTATIN SODIUM 20 MG PO TABS
20.0000 mg | ORAL_TABLET | Freq: Every day | ORAL | 0 refills | Status: DC
Start: 2020-02-05 — End: 2020-05-30

## 2020-02-22 DIAGNOSIS — S32050A Wedge compression fracture of fifth lumbar vertebra, initial encounter for closed fracture: Secondary | ICD-10-CM | POA: Diagnosis not present

## 2020-02-23 ENCOUNTER — Telehealth: Payer: Self-pay

## 2020-02-23 NOTE — Progress Notes (Signed)
Chronic Care Management Pharmacy Assistant   Name: Donna Little  MRN: [id number] DOB: 11/05/1945  Reason for Encounter:COPD Disease State Call.   PCP : [name], MD  Allergies:   Allergies  Allergen Reactions  . Codeine   . Methocarbamol   . Sertraline     Insomnia   . Tizanidine     Medications: Outpatient Encounter Medications as of 02/23/2020  Medication Sig  . albuterol (VENTOLIN HFA) 108 (90 Base) MCG/ACT inhaler Inhale 2 puffs into the lungs every 6 (six) hours as needed.   . citalopram (CELEXA) 20 MG tablet Take 1 tablet by mouth once daily  . clopidogrel (PLAVIX) 75 MG tablet Take 75 mg by mouth daily.  Marland Kitchen denosumab (PROLIA) 60 MG/ML SOSY injection Inject 60 mg into the skin every 6 (six) months.  . diclofenac Sodium (VOLTAREN) 1 % GEL Apply 2 g topically 4 (four) times daily as needed.   . dicyclomine (BENTYL) 10 MG capsule TAKE 1 CAPSULE BY MOUTH 4 TIMES DAILY BEFORE MEAL(S) AND AT BEDTIME  . Fluticasone-Umeclidin-Vilant (TRELEGY ELLIPTA) 100-62.5-25 MCG/INH AEPB INHALE 1 PUFF ONCE DAILY USE AT THE SAME TIME EACH DAY  . furosemide (LASIX) 40 MG tablet Take 2 tablets by mouth twice daily  . gabapentin (NEURONTIN) 800 MG tablet TAKE 1 TABLET BY MOUTH THREE TIMES DAILY  . meclizine (ANTIVERT) 25 MG tablet Take 25 mg by mouth 3 (three) times daily as needed.   . metoprolol succinate (TOPROL-XL) 25 MG 24 hr tablet Take 25 mg by mouth in the morning and at bedtime.   Marland Kitchen morphine (MS CONTIN) 30 MG 12 hr tablet Take 1 tablet (30 mg total) by mouth in the morning, at noon, and at bedtime.  . nitroGLYCERIN (NITROSTAT) 0.4 MG SL tablet Place under the tongue.  . ondansetron (ZOFRAN-ODT) 4 MG disintegrating tablet DISSOLVE 1 TO 2 TABLETS IN MOUTH EVERY 8 HOURS AS NEEDED FOR NAUSEA  . pantoprazole (PROTONIX) 40 MG tablet Take 1 tablet by mouth once daily  . Potassium Chloride ER 20 MEQ TBCR Take 1 tablet by mouth twice daily with food  . pravastatin (PRAVACHOL) 20 MG  tablet Take 1 tablet (20 mg total) by mouth daily.   No facility-administered encounter medications on file as of 02/23/2020.    Current Diagnosis: Patient Active Problem List   Diagnosis Date Noted  . Right hip pain 09/21/2019  . Contusion of scalp 09/21/2019  . Rash 09/21/2019  . Confusion 09/21/2019  . Back pain with sciatica 08/21/2019  . Incontinence of urine in female 08/21/2019  . Trigger finger, right ring finger 06/26/2019  . Pulmonary embolism ([place]) 01/12/2017  . Cerebrovascular disease 01/16/2015  . Hyperlipidemia 01/16/2015  . Hypertensive heart disease with heart failure ([place]) 01/16/2015  . Tobacco abuse 01/16/2015    Follow-Up:  Pharmacist Review   . Current COPD regimen:  ? Albuterol 108 MCG/ACT 2 Puffs PRN  ? Trelegy 100-62.5 MCG/INH 1 PUFF ONCE DAILY USE AT THE SAME TIME EACH DAY . No flowsheet data found. . Any recent hospitalizations or ED visits since last visit with CPP? No . Reports COPD symptoms, including Increased shortness of breath , Rescue medicine is not helping, Shortness of breath at rest, Symptoms worse at night and Wheezing . What recent interventions/DTPs have been made by any provider to improve breathing since last visit o None ID . Have you had exacerbation/flare-up since last visit? No . What do you do when you are short of breath?  Rescue medication (  Albuterol)  Respiratory Devices/Equipment . Do you have a nebulizer? Yes  . Do you use a Peak Flow Meter? Yes . Do you use a maintenance inhaler? Yes . How often do you forget to use your daily inhaler?  o Patient states she use her inhaler daily, she tries to do it at the same time. . Do you use a rescue inhaler? Yes . How often do you use your rescue inhaler?  1-2x per week . Do you use a spacer with your inhaler? No  Adherence Review: . Does the patient have >5 day gap between last estimated fill date for maintenance inhaler medications? Yes   Patient states she does not want to  pursue patient assistance for Trelegy at this time.  Patient reports she is waiting for a call from [facility] in [facility] for back surgery.  [facility] Pharmacist Assistant [phone]

## 2020-03-01 ENCOUNTER — Other Ambulatory Visit: Payer: Self-pay | Admitting: Family Medicine

## 2020-03-01 ENCOUNTER — Other Ambulatory Visit: Payer: Self-pay | Admitting: Physician Assistant

## 2020-03-05 ENCOUNTER — Other Ambulatory Visit: Payer: Self-pay

## 2020-03-05 MED ORDER — MORPHINE SULFATE ER 30 MG PO TBCR: 30.0000 mg | EXTENDED_RELEASE_TABLET | Freq: Three times a day (TID) | ORAL | 0 refills | Status: DC

## 2020-03-14 ENCOUNTER — Telehealth: Payer: Medicare Other
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
## 2020-03-18 ENCOUNTER — Ambulatory Visit (INDEPENDENT_AMBULATORY_CARE_PROVIDER_SITE_OTHER): Payer: Medicare Other | Admitting: Family Medicine

## 2020-03-18 ENCOUNTER — Other Ambulatory Visit: Payer: Self-pay

## 2020-03-18 VITALS — BP 124/66 | HR 83 | Temp 97.3°F | Ht 63.0 in | Wt 126.0 lb

## 2020-03-18 DIAGNOSIS — Z0001 Encounter for general adult medical examination with abnormal findings: Secondary | ICD-10-CM

## 2020-03-18 NOTE — Progress Notes (Signed)
Subjective:  Patient ID: Donna Little, female    DOB: 13-Oct-1945  Age: 73 y.o. MRN: [id number]  Chief Complaint  Patient presents with  . Annual Exam    HPI Encounter for general adult medical examination without abnormal findings  Physical ("At Risk" items are starred): Patient's last physical exam was 1 year ago .  Smoking: smoker-smokes occasionally ;  Physical Activity: Unable to exercise. Alcohol/Drug Use: Is a non-drinker ; No illicit drug use ;  Patient is not afflicted from Stress Incontinence and Urge Incontinence  Safety: reviewed ; Patient wears a seat belt, has smoke detectors, has carbon monoxide detectors, practices appropriate gun safety, and does not wear sunscreen with extended sun exposure. Dental Care: brushes-has dentures. Ophthalmology/Optometry: Annual visit.  Hearing loss: none Vision impairments: none  DEXA-09/08/2019 COLONOSCOPY 11/30/2011 MAMMOFINDINGS 07/25/2019   Depression screen [facility] 2/9 03/18/2020 01/16/2020 09/21/2019  Decreased Interest 3 1 2   Down, Depressed, Hopeless 3 1 3   PHQ - 2 Score 6 2 5   Altered sleeping 3 1 3   Tired, decreased energy 3 1 3   Change in appetite 3 3 3   Feeling bad or failure about yourself  0 0 0  Trouble concentrating 0 0 0  Moving slowly or fidgety/restless 0 0 3  Suicidal thoughts 0 0 0  PHQ-9 Score 15 7 17   Difficult doing work/chores Very difficult - Very difficult     Functional Status Survey: Is the patient deaf or have difficulty hearing?: No Does the patient have difficulty seeing, even when wearing glasses/contacts?: No Does the patient have difficulty concentrating, remembering, or making decisions?: No Does the patient have difficulty walking or climbing stairs?: Yes Does the patient have difficulty dressing or bathing?: No Does the patient have difficulty doing errands alone such as visiting a doctor's office or shopping?: Yes  Has problems bathing. Getting in the bathtub and uses shower chair.  Showers daily.  Social Hx   Social History   Socioeconomic History  . Marital status: Single    Spouse name: Not on file  . Number of children: 2  . Years of education: Not on file  . Highest education level: Not on file  Occupational History  . Occupation: retired  Tobacco Use  . Smoking status: Former Smoker    Packs/day: 0.50    Years: 50.00    Pack years: 25.00  . Smokeless tobacco: Never Used  Substance and Sexual Activity  . Alcohol use: No    Alcohol/week: 0.0 standard drinks  . Drug use: No  . Sexual activity: Not on file  Other Topics Concern  . Not on file  Social History Narrative  . Not on file   Social Determinants of Health   Financial Resource Strain: Not on file  Food Insecurity: No Food Insecurity  . Worried About Charity fundraiser in the Last Year: Never true  . Ran Out of Food in the Last Year: Never true  Transportation Needs: Not on file  Physical Activity: Not on file  Stress: Not on file  Social Connections: Not on file   Past Medical History:  Diagnosis Date  . A-fib ([place])   . Chronic pain syndrome   . Chronic systolic heart failure ([place])   . Depression   . Generalized atherosclerosis   . GERD (gastroesophageal reflux disease)   . Hypertension   . IBS (irritable bowel syndrome)   . Melanoma ([place])   . Other specified nontoxic goiter   . Palpitations   . Polymyalgia  rheumatica ([place])   . Primary pulmonary hypertension ([place])   . Pulmonary embolism ([place])   . Stroke ([place])    ministroke  . Supraventricular tachycardia ([place])   . Type 2 diabetes mellitus (H[facility])    Family History  Problem Relation Age of Onset  . Diabetes Other   . Stroke Other   . Heart disease Other     Review of Systems  Constitutional: Negative for chills, fatigue and fever.  HENT: Negative for congestion, ear pain, rhinorrhea and sore throat.   Respiratory: Positive for cough and shortness of breath.   Cardiovascular: Negative for chest pain.   Gastrointestinal: Negative for abdominal pain, constipation, diarrhea, nausea and vomiting.  Genitourinary: Negative for dysuria and urgency.  Musculoskeletal: Positive for arthralgias (rt 3rd finger triggers. Left wrist.) and back pain. Negative for myalgias.  Neurological: Negative for dizziness, weakness, light-headedness and headaches.  Psychiatric/Behavioral: Positive for dysphoric mood. The patient is nervous/anxious.      Objective:  BP 124/66   Pulse 83   Temp (!) 97.3 F (36.3 C)   Ht 5\' 3"  (1.6 m)   Wt 126 lb (57.2 kg)   SpO2 96%   BMI 22.32 kg/m   BP/Weight 03/19/2020 03/18/2020 98/92/1194  Systolic BP 174 081 448  Diastolic BP 60 66 68  Wt. (Lbs) 124 126 125.6  BMI 21.97 22.32 22.25    Physical Exam Constitutional:      Appearance: Normal appearance.  Cardiovascular:     Rate and Rhythm: Normal rate and regular rhythm.     Heart sounds: Normal heart sounds.  Pulmonary:     Effort: Pulmonary effort is normal.     Breath sounds: Normal breath sounds.  Musculoskeletal:     Comments: Right hand 3rd digit trigger finger  Neurological:     Mental Status: She is alert.     Lab Results  Component Value Date   WBC 7.5 12/26/2019   HGB 13.5 12/26/2019   HCT 41.3 12/26/2019   PLT 236 12/26/2019   GLUCOSE 86 01/16/2020   CHOL 157 01/16/2020   TRIG 136 01/16/2020   HDL 35 (L) 01/16/2020   LDLCALC 98 01/16/2020   ALT 8 01/16/2020   AST 20 01/16/2020   NA 139 01/16/2020   K 4.5 01/16/2020   CL 100 01/16/2020   CREATININE 0.77 01/16/2020   BUN 13 01/16/2020   CO2 27 01/16/2020   TSH 1.050 12/26/2019   HGBA1C 5.8 (H) 01/16/2020   MICROALBUR 30 01/20/2020      Assessment & Plan:  1. Encounter for general adult medical examination with abnormal findings  Discussed advanced directives.  Recommend continue to work on eating healthy diet and exercise. dexa and mammogram UTD.    No orders of the defined types were placed in this encounter.    This  is a list of the screening recommended for you and due dates:  Health Maintenance  Topic Date Due  .  Hepatitis C: One time screening is recommended by Center for Disease Control  (CDC) for  adults born from [birth year] through 1967.   Never done  . Tetanus Vaccine  Never done  . Urine Protein Check  01/19/2021  . Flu Shot  Completed  . DEXA scan (bone density measurement)  Completed  . COVID-19 Vaccine  Completed  . Pneumonia vaccines  Completed     AN INDIVIDUALIZED CARE PLAN: was established or reinforced today.   SELF MANAGEMENT: The patient and I together assessed ways to personally work  towards obtaining the recommended goals  Support needs The patient and/or family needs were assessed and services were offered and not necessary at this time.    Follow-up: Return today (on 03/18/2020) for AWV.  [name], MD [facility] [phone]

## 2020-03-18 NOTE — Patient Instructions (Signed)
Preventive Care 42 Years and Older, Female Preventive care refers to lifestyle choices and visits with your health care provider that can promote health and wellness. This includes:  A yearly physical exam. This is also called an annual well check.  Regular dental and eye exams.  Immunizations.  Screening for certain conditions.  Healthy lifestyle choices, such as diet and exercise. What can I expect for my preventive care visit? Physical exam Your health care provider will check:  Height and weight. These may be used to calculate body mass index (BMI), which is a measurement that tells if you are at a healthy weight.  Heart rate and blood pressure.  Your skin for abnormal spots. Counseling Your health care provider may ask you questions about:  Alcohol, tobacco, and drug use.  Emotional well-being.  Home and relationship well-being.  Sexual activity.  Eating habits.  History of falls.  Memory and ability to understand (cognition).  Work and work Statistician.  Pregnancy and menstrual history. What immunizations do I need?  Influenza (flu) vaccine  This is recommended every year. Tetanus, diphtheria, and pertussis (Tdap) vaccine  You may need a Td booster every 10 years. Varicella (chickenpox) vaccine  You may need this vaccine if you have not already been vaccinated. Zoster (shingles) vaccine  You may need this after age 76. Pneumococcal conjugate (PCV13) vaccine  One dose is recommended after age 30. Pneumococcal polysaccharide (PPSV23) vaccine  One dose is recommended after age 64. Measles, mumps, and rubella (MMR) vaccine  You may need at least one dose of MMR if you were born in 1957 or later. You may also need a second dose. Meningococcal conjugate (MenACWY) vaccine  You may need this if you have certain conditions. Hepatitis A vaccine  You may need this if you have certain conditions or if you travel or work in places where you may be exposed  to hepatitis A. Hepatitis B vaccine  You may need this if you have certain conditions or if you travel or work in places where you may be exposed to hepatitis B. Haemophilus influenzae type b (Hib) vaccine  You may need this if you have certain conditions. You may receive vaccines as individual doses or as more than one vaccine together in one shot (combination vaccines). Talk with your health care provider about the risks and benefits of combination vaccines. What tests do I need? Blood tests  Lipid and cholesterol levels. These may be checked every 5 years, or more frequently depending on your overall health.  Hepatitis C test.  Hepatitis B test. Screening  Lung cancer screening. You may have this screening every year starting at age 60 if you have a 30-pack-year history of smoking and currently smoke or have quit within the past 15 years.  Colorectal cancer screening. All adults should have this screening starting at age 22 and continuing until age 35. Your health care provider may recommend screening at age 28 if you are at increased risk. You will have tests every 1-10 years, depending on your results and the type of screening test.  Diabetes screening. This is done by checking your blood sugar (glucose) after you have not eaten for a while (fasting). You may have this done every 1-3 years.  Mammogram. This may be done every 1-2 years. Talk with your health care provider about how often you should have regular mammograms.  BRCA-related cancer screening. This may be done if you have a family history of breast, ovarian, tubal, or peritoneal cancers.  Other tests  Sexually transmitted disease (STD) testing.  Bone density scan. This is done to screen for osteoporosis. You may have this done starting at age 44. Follow these instructions at home: Eating and drinking  Eat a diet that includes fresh fruits and vegetables, whole grains, lean protein, and low-fat dairy products. Limit  your intake of foods with high amounts of sugar, saturated fats, and salt.  Take vitamin and mineral supplements as recommended by your health care provider.  Do not drink alcohol if your health care provider tells you not to drink.  If you drink alcohol: ? Limit how much you have to 0-1 drink a day. ? Be aware of how much alcohol is in your drink. In the [place] one drink equals one 12 oz bottle of beer (355 mL), one 5 oz glass of wine (148 mL), or one 1 oz glass of hard liquor (44 mL). Lifestyle  Take daily care of your teeth and gums.  Stay active. Exercise for at least 30 minutes on 5 or more days each week.  Do not use any products that contain nicotine or tobacco, such as cigarettes, e-cigarettes, and chewing tobacco. If you need help quitting, ask your health care provider.  If you are sexually active, practice safe sex. Use a condom or other form of protection in order to prevent STIs (sexually transmitted infections).  Talk with your health care provider about taking a low-dose aspirin or statin. What's next?  Go to your health care provider once a year for a well check visit.  Ask your health care provider how often you should have your eyes and teeth checked.  Stay up to date on all vaccines. This information is not intended to replace advice given to you by your health care provider. Make sure you discuss any questions you have with your health care provider. Document Revised: 03/17/2018 Document Reviewed: 03/17/2018 Elsevier Patient Education  2020 [employer].

## 2020-03-19 ENCOUNTER — Encounter: Payer: Self-pay | Admitting: Family Medicine

## 2020-03-19 ENCOUNTER — Ambulatory Visit (INDEPENDENT_AMBULATORY_CARE_PROVIDER_SITE_OTHER): Payer: Medicare Other | Admitting: Family Medicine

## 2020-03-19 VITALS — BP 110/60 | HR 76 | Temp 97.4°F | Ht 63.0 in | Wt 124.0 lb

## 2020-03-19 DIAGNOSIS — M65331 Trigger finger, right middle finger: Secondary | ICD-10-CM | POA: Diagnosis not present

## 2020-03-19 MED ORDER — TRIAMCINOLONE ACETONIDE 40 MG/ML IJ SUSP: 20.0000 mg | Freq: Once | INTRAMUSCULAR | Status: DC

## 2020-03-19 NOTE — Progress Notes (Signed)
Subjective:  Patient ID: Donna Little, female    DOB: [birth year]  Age: 74 y.o. MRN: [id number]  Chief Complaint  Patient presents with  . Hand Pain    HPI  Patient is here for a finger injection on her right third finger. States that she ahs  A trigger finger, it has been hurting for the past 3-4 weeks.   Current Outpatient Medications on File Prior to Visit  Medication Sig Dispense Refill  . albuterol (VENTOLIN HFA) 108 (90 Base) MCG/ACT inhaler Inhale 2 puffs into the lungs every 6 (six) hours as needed.     . citalopram (CELEXA) 20 MG tablet Take 1 tablet by mouth once daily 90 tablet 0  . clopidogrel (PLAVIX) 75 MG tablet Take 75 mg by mouth daily.    Marland Kitchen denosumab (PROLIA) 60 MG/ML SOSY injection Inject 60 mg into the skin every 6 (six) months.    . diclofenac Sodium (VOLTAREN) 1 % GEL Apply 2 g topically 4 (four) times daily as needed.     . dicyclomine (BENTYL) 10 MG capsule TAKE 1 CAPSULE BY MOUTH 4 TIMES DAILY BEFORE MEAL(S) AND AT BEDTIME 90 capsule 0  . Fluticasone-Umeclidin-Vilant (TRELEGY ELLIPTA) 100-62.5-25 MCG/INH AEPB INHALE 1 PUFF ONCE DAILY USE AT THE SAME TIME EACH DAY 1 each 1  . furosemide (LASIX) 40 MG tablet Take 2 tablets by mouth twice daily 360 tablet 0  . gabapentin (NEURONTIN) 800 MG tablet TAKE 1 TABLET BY MOUTH THREE TIMES DAILY 270 tablet 1  . meclizine (ANTIVERT) 25 MG tablet Take 25 mg by mouth 3 (three) times daily as needed.     . metoprolol succinate (TOPROL-XL) 25 MG 24 hr tablet Take 25 mg by mouth in the morning and at bedtime.     Marland Kitchen morphine (MS CONTIN) 30 MG 12 hr tablet Take 1 tablet (30 mg total) by mouth in the morning, at noon, and at bedtime. 90 tablet 0  . nitroGLYCERIN (NITROSTAT) 0.4 MG SL tablet Place under the tongue.    . ondansetron (ZOFRAN-ODT) 4 MG disintegrating tablet DISSOLVE 1 TO 2 TABLETS IN MOUTH EVERY 8 HOURS AS NEEDED FOR NAUSEA 40 tablet 0  . pantoprazole (PROTONIX) 40 MG tablet Take 1 tablet by mouth once daily 90 tablet 1   . Potassium Chloride ER 20 MEQ TBCR Take 1 tablet by mouth twice daily with food 180 tablet 0  . pravastatin (PRAVACHOL) 20 MG tablet Take 1 tablet (20 mg total) by mouth daily. 90 tablet 0   No current facility-administered medications on file prior to visit.   Past Medical History:  Diagnosis Date  . A-fib ([place])   . Chronic pain syndrome   . Chronic systolic heart failure ([place])   . Depression   . Generalized atherosclerosis   . GERD (gastroesophageal reflux disease)   . Hypertension   . IBS (irritable bowel syndrome)   . Melanoma ([place])   . Other specified nontoxic goiter   . Palpitations   . Polymyalgia rheumatica ([place])   . Primary pulmonary hypertension ([place])   . Pulmonary embolism ([place])   . Stroke ([place])    ministroke  . Supraventricular tachycardia ([place])   . Type 2 diabetes mellitus ([place])    Past Surgical History:  Procedure Laterality Date  . ABDOMINAL HYSTERECTOMY    . APPENDECTOMY    . CATARACT EXTRACTION    . CORONARY ANGIOPLASTY WITH STENT PLACEMENT      Family History  Problem Relation Age of Onset  . Diabetes  Other   . Stroke Other   . Heart disease Other    Social History   Socioeconomic History  . Marital status: Single    Spouse name: Not on file  . Number of children: 2  . Years of education: Not on file  . Highest education level: Not on file  Occupational History  . Occupation: retired  Tobacco Use  . Smoking status: Former Smoker    Packs/day: 0.50    Years: 50.00    Pack years: 25.00  . Smokeless tobacco: Never Used  Substance and Sexual Activity  . Alcohol use: No    Alcohol/week: 0.0 standard drinks  . Drug use: No  . Sexual activity: Not on file  Other Topics Concern  . Not on file  Social History Narrative  . Not on file   Social Determinants of Health   Financial Resource Strain: Not on file  Food Insecurity: No Food Insecurity  . Worried About Charity fundraiser in the Last Year: Never true  . Ran Out of Food in the  Last Year: Never true  Transportation Needs: Not on file  Physical Activity: Not on file  Stress: Not on file  Social Connections: Not on file    Review of Systems  Constitutional: Negative for chills, fatigue and fever.  HENT: Negative for congestion, ear pain, postnasal drip, rhinorrhea, sinus pressure, sinus pain and sore throat.   Respiratory: Negative for cough and shortness of breath.   Cardiovascular: Negative for chest pain.  Gastrointestinal: Negative for diarrhea and nausea.  Neurological: Negative for dizziness and headaches.     Objective:  BP 110/60   Pulse 76   Temp (!) 97.4 F (36.3 C)   Ht 5\' 3"  (1.6 m)   Wt 124 lb (56.2 kg)   SpO2 98%   BMI 21.97 kg/m   BP/Weight 03/19/2020 03/18/2020 80/16/5537  Systolic BP 482 707 867  Diastolic BP 60 66 68  Wt. (Lbs) 124 126 125.6  BMI 21.97 22.32 22.25    Physical Exam Third digit of right hand triggering.   Diabetic Foot Exam - Simple   No data filed      Lab Results  Component Value Date   WBC 7.5 12/26/2019   HGB 13.5 12/26/2019   HCT 41.3 12/26/2019   PLT 236 12/26/2019   GLUCOSE 86 01/16/2020   CHOL 157 01/16/2020   TRIG 136 01/16/2020   HDL 35 (L) 01/16/2020   LDLCALC 98 01/16/2020   ALT 8 01/16/2020   AST 20 01/16/2020   NA 139 01/16/2020   K 4.5 01/16/2020   CL 100 01/16/2020   CREATININE 0.77 01/16/2020   BUN 13 01/16/2020   CO2 27 01/16/2020   TSH 1.050 12/26/2019   HGBA1C 5.8 (H) 01/16/2020   MICROALBUR 30 01/20/2020      Assessment & Plan:   1. Trigger middle finger of right hand Risks were discussed including bleeding, infection, increase in sugars if diabetic, atrophy at site of injection, and increased pain.  After consent was obtained, using sterile technique the area was prepped withalcohol.  Kenalog 20 mg and 0.5 ml plain Lidocaine was then injected and the needle withdrawn.  The procedure was well tolerated.   The patient is asked to continue to rest the joint for a few  more days before resuming regular activities.  It may be more painful for the first 1-2 days.  Watch for fever, or increased swelling or persistent pain in the joint. Call or  return to clinic prn if such symptoms occur or there is failure to improve as anticipated.  Follow-up: Return if symptoms worsen or fail to improve.  An After Visit Summary was printed and given to the patient.  [name], MD [facility] [phone]

## 2020-03-21 ENCOUNTER — Other Ambulatory Visit: Payer: Self-pay

## 2020-03-21 ENCOUNTER — Ambulatory Visit: Payer: Medicare Other

## 2020-03-21 ENCOUNTER — Other Ambulatory Visit: Payer: Self-pay | Admitting: Family Medicine

## 2020-03-21 DIAGNOSIS — E782 Mixed hyperlipidemia: Secondary | ICD-10-CM

## 2020-03-21 DIAGNOSIS — I110 Hypertensive heart disease with heart failure: Secondary | ICD-10-CM

## 2020-03-21 NOTE — Chronic Care Management (AMB) (Signed)
Chronic Care Management Pharmacy  Name: Donna Little  MRN: [id number] DOB: Sep 15, 1945  Chief Complaint/ HPI  Donna Little,  74 y.o. , female presents for their Follow-Up CCM visit with the clinical pharmacist via telephone due to COVID-19 Pandemic.  PCP : [name], MD  Their chronic conditions include: CVD, HTN, PE, HLD.   Office Visits: 02/01/2020 - Prolia given.  01/16/2020 - no changes to medications. Labs drawn.  12/28/2019 - Pfizer Covid shot.  12/26/2019 - labs drawn to assess muscle pain.  Consult Visit: 02/22/2020 - Ortho - patient proceeding with kyphoplasty. Pain increased from compression fracture.  01/04/2020 - Ortho - post-op visit.  Medications: Outpatient Encounter Medications as of 03/21/2020  Medication Sig  . albuterol (VENTOLIN HFA) 108 (90 Base) MCG/ACT inhaler Inhale 2 puffs into the lungs every 6 (six) hours as needed.   . citalopram (CELEXA) 20 MG tablet Take 1 tablet by mouth once daily  . clopidogrel (PLAVIX) 75 MG tablet Take 75 mg by mouth daily.  Marland Kitchen denosumab (PROLIA) 60 MG/ML SOSY injection Inject 60 mg into the skin every 6 (six) months.  . diclofenac Sodium (VOLTAREN) 1 % GEL Apply 2 g topically 4 (four) times daily as needed.   . Fluticasone-Umeclidin-Vilant (TRELEGY ELLIPTA) 100-62.5-25 MCG/INH AEPB INHALE 1 PUFF ONCE DAILY USE AT THE SAME TIME EACH DAY  . furosemide (LASIX) 40 MG tablet Take 2 tablets by mouth twice daily  . gabapentin (NEURONTIN) 800 MG tablet TAKE 1 TABLET BY MOUTH THREE TIMES DAILY  . meclizine (ANTIVERT) 25 MG tablet Take 25 mg by mouth 3 (three) times daily as needed.   . metoprolol succinate (TOPROL-XL) 25 MG 24 hr tablet Take 25 mg by mouth in the morning and at bedtime.   Marland Kitchen morphine (MS CONTIN) 30 MG 12 hr tablet Take 1 tablet (30 mg total) by mouth in the morning, at noon, and at bedtime.  . nitroGLYCERIN (NITROSTAT) 0.4 MG SL tablet Place under the tongue.  . ondansetron (ZOFRAN-ODT) 4 MG disintegrating tablet  DISSOLVE 1 TO 2 TABLETS IN MOUTH EVERY 8 HOURS AS NEEDED FOR NAUSEA  . pantoprazole (PROTONIX) 40 MG tablet Take 1 tablet by mouth once daily  . Potassium Chloride ER 20 MEQ TBCR Take 1 tablet by mouth twice daily with food  . pravastatin (PRAVACHOL) 20 MG tablet Take 1 tablet (20 mg total) by mouth daily.  . [DISCONTINUED] dicyclomine (BENTYL) 10 MG capsule TAKE 1 CAPSULE BY MOUTH 4 TIMES DAILY BEFORE MEAL(S) AND AT BEDTIME   Facility-Administered Encounter Medications as of 03/21/2020  Medication  . triamcinolone acetonide (KENALOG-40) injection 20 mg   Allergies  Allergen Reactions  . Codeine   . Methocarbamol   . Sertraline     Insomnia   . Tizanidine    SDOH Screenings   Alcohol Screen: Not on file  Depression (PHQ2-9): Medium Risk  . PHQ-2 Score: 15  Financial Resource Strain: Not on file  Food Insecurity: No Food Insecurity  . Worried About Charity fundraiser in the Last Year: Never true  . Ran Out of Food in the Last Year: Never true  Housing: Low Risk   . Last Housing Risk Score: 0  Physical Activity: Not on file  Social Connections: Not on file  Stress: Not on file  Tobacco Use: Medium Risk  . Smoking Tobacco Use: Former Smoker  . Smokeless Tobacco Use: Never Used  Transportation Needs: Not on file     Current Diagnosis/Assessment:  Goals Addressed  This Visit's Progress   . Pharmacy Care Plan       CARE PLAN ENTRY (see longitudinal plan of care for additional care plan information)  Current Barriers:  . Chronic Disease Management support, education, and care coordination needs related to Hypertension, Hyperlipidemia, and COPD   Hypertension BP Readings from Last 3 Encounters:  03/19/20 110/60  03/18/20 124/66  01/16/20 (!) 144/68   . Pharmacist Clinical Goal(s): o Over the next 90 days, patient will work with PharmD and providers to maintain BP goal <140/90 . Current regimen:  o Furosemide 80 mg bid . Interventions: o Reviewed  medication list.  o Recommend patient work on improving diet and avoid salting food.  o Recommended elevating feet to minimize swelling.  . Patient self care activities - Over the next 90 days, patient will: o Ensure daily salt intake < 2300 mg/day  Hyperlipidemia Lab Results  Component Value Date/Time   LDLCALC 98 01/16/2020 09:21 AM   . Pharmacist Clinical Goal(s): o Over the next 90 days, patient will work with PharmD and providers to maintain LDL goal < 100 . Current regimen:  o Pravastatin 20 mg daily  . Interventions: o Recommend patient watch diet and focus on incorporating lean meats and vegetables.  o Patient unable to exercise due to back. Has upcoming surgery in February.  . Patient self care activities - Over the next 90 days, patient will: o Continue to take pravastatin as precribed.   Depression . Pharmacist Clinical Goal(s): o Over the next 90 days, patient will work with PharmD and providers to achieve goal of improved management of symptoms of crying . Current regimen:  o Citalopram 20 mg daily  . Interventions: o Discussed taking medication daily as prescribed.  . Patient self care activities - Over the next 90 days, patient will: o Contact provider with any concerns of questions  COPD . Pharmacist Clinical Goal(s) o Over the next 90 days, patient will work with PharmD and providers to reduce risk of COPD flares.  . Current regimen:  o Albuterol prn  o Trelegy inhaler daily  . Interventions: o Continue Trelegy inhaler daily for prevention of shortness of breath or wheezing.  . Patient self care activities - Over the next 90 days, patient will: o Take Trelegy daily as prescribed.   Medication management . Pharmacist Clinical Goal(s): o Over the next 90 days, patient will work with PharmD and providers to achieve optimal medication adherence . [facility] . Interventions o Comprehensive medication review performed. o Continue  current medication management strategy . Patient self care activities - Over the next 90 days, patient will: o Focus on medication adherence by continuing to use pill box.  o Take medications as prescribed o Report any questions or concerns to PharmD and/or provider(s)  Please see past updates related to this goal by clicking on the "Past Updates" button in the selected goal        AFIB   CHA2DS2-VASc Score = 7  The patient's score is based upon: CHF History: No HTN History: Yes Diabetes History: No Stroke History: Yes Vascular Disease History: Yes  { Patient is currently rate controlled. HR 97 BPM  Patient has failed these meds in past: Eliquis Patient is currently controlled on the following medications:   metoprolol succinate 25 mg bid  Clopidogrel 75 mg daily  We discussed:  Patient denies recent symptoms of afib. Concerned with another surgery/anesthesia so close but needs relief and improved posture.   Plan  Continue current medications    Hypertension   BP today is:  <130/80  Office blood pressures are  BP Readings from Last 3 Encounters:  03/19/20 110/60  03/18/20 124/66  01/16/20 (!) 144/68    Patient has failed these meds in the past: n/a Patient is currently controlled on the following medications:   Furosemide 80 mg bid  Metoprolol Succinate 25 mg bid   We discussed diet and exercise extensively. Patient is unable to exercise due to back issues.   Plan  Continue current medications   Hyperlipidemia/Hx of Stroke/CVD   Lipid Panel     Component Value Date/Time   CHOL 157 01/16/2020 0921   TRIG 136 01/16/2020 0921   HDL 35 (L) 01/16/2020 0921   LDLCALC 98 01/16/2020 0921    Hepatic Function Latest Ref Rng & Units 01/16/2020 12/26/2019  Total Protein 6.0 - 8.5 g/dL 7.2 6.8  Albumin 3.7 - 4.7 g/dL 4.2 3.9  AST 0 - 40 IU/L 20 16  ALT 0 - 32 IU/L 8 8  Alk Phosphatase 44 - 121 IU/L 123(H) 125(H)  Total Bilirubin 0.0 - 1.2 mg/dL 0.3  <0.2     The 10-year ASCVD risk score Mikey Bussing DC Jr., et al., 2013) is: 40.7%   Values used to calculate the score:     Age: 39 years     Sex: Female     Is Non-Hispanic African American: No     Diabetic: Yes     Tobacco smoker: Yes     Systolic Blood Pressure: 154 mmHg     Is BP treated: Yes     HDL Cholesterol: 35 mg/dL     Total Cholesterol: 157 mg/dL   Patient has failed these meds in past: omega 3 1g daily Patient is currently controlled on the following medications:  . Pravastatin 20 mg daily . NTG 0.4 mg prn  We discussed:  diet and exercise extensively. After upcoming surgery, consider changing patient's cholesterol medication to atorvastatin or rosuvastatin due to ASCVD risk.   Plan  Continue current medications   COPD / Asthma / Tobacco   Last spirometry score: 69%  Gold Grade: Gold 2 (FEV1 50-79%)  Eosinophil count:  No results found for: EOSPCT%                               Eos (Absolute):  Lab Results  Component Value Date/Time   EOSABS 0.2 12/26/2019 03:46 PM    Tobacco Status:  Social History   Tobacco Use  Smoking Status Former Smoker  . Packs/day: 0.50  . Years: 50.00  . Pack years: 25.00  Smokeless Tobacco Never Used    Patient has failed these meds in past:  Patient is currently uncontrolled on the following medications:   Albuterol prn  Trelegy Ellipta 1 puff once daily Using maintenance inhaler regularly? No Frequency of rescue inhaler use:  infrequently  We discussed:  proper inhaler technique. Patient denies symptoms or concerns with breathing at this time. Feels that Trelegy is working well.   Plan  ContinueTrelegy inhaler daily as a preventative.   GERD/IBS   Patient has failed these meds in past: n/a Patient is currently uncontrolled on the following medications:  . Dicyclomine 10 mg qid  . Ondansetron ODT 4 mg prn . Pantoprazole 40 mg daily   We discussed:  Patient has not followed up with GI due to back  pain/immobility. Will revisit after February surgery.  Plan  Recommend discussing symptoms at next visit after pain/immobility issue improved.     and  Other Diagnosis:Back Pain    Patient has failed these meds in past: n/a Patient is currently uncontrolled on the following medications:   ms contin 30 mg tid  gabapentin 800 mg tid  We discussed:  Patient hoping that pain control will improve after upcoming surgery February.   Plan  Recommend patient schedule a visit Rheumatology after surgery with Dr. Prince Rome.   Depression   Patient has failed these meds in past: venlafaxine  Patient is currently uncontrolled on the following medications:  . Citalopram 20 mg daily  PHQ9 SCORE ONLY 03/18/2020 01/16/2020 09/21/2019  PHQ-9 Total Score $RemoveBef'15 7 17     'vRAFrPbYYW$ We discussed: Patient is very disappointed that recent back surgery "made her worse". She is trying to remain positive but concerned for upcoming surgery.   Plan  Continue current medication.   Osteopenia / Osteoporosis   Last DEXA Scan: 04/22/2018  T-Score femoral neck: -2.8  T-Score forearm radius: -3.3    No results found for: VD25OH   Patient is a candidate for pharmacologic treatment due to T-Score < -2.5 in femoral neck  Patient has failed these meds in past: calcium  Patient is currently controlled on the following medications:   prolia q6 months  We discussed:  Recommend [phone] units of vitamin D daily. Recommend 1200 mg of calcium daily from dietary and supplemental sources. Recommend weight-bearing and muscle strengthening exercises for building and maintaining bone density.   Due to stomach issues she was unable to tolerate calcium in the past. Patient's dexa scan is stable at this time. Discussed incorporating calcium in diet due to inability to tolerate oral calcium supplementation.   Plan  Continue current medications   Health Maintenance   Patient is currently controlled on the following  medications:  Marland Kitchen Meclizine 25 mg tid prn - dizziness . Potassium Chloride ER 20 meq bid - supplementation  We discussed:  Patient reports that she has not had to use meclizine for dizziness lately but likes to have it on hand.   Plan  Continue current medications  Vaccines   Reviewed and discussed patient's vaccination history. Have taken both COVID vaccines.   Immunization History  Administered Date(s) Administered  . Fluad Quad(high Dose 65+) 12/20/2019  . Influenza-Unspecified 12/19/2018  . PFIZER SARS-COV-2 Vaccination 05/13/2019, 06/04/2019, 12/28/2019  . Pneumococcal Conjugate-13 03/21/2014  . Pneumococcal Polysaccharide-23 05/27/2018  . Zoster Recombinat (Shingrix) 12/16/2017, 05/27/2018    Plan  Recommend annual flu shot.   Medication Management   Pt uses [facility] for all medications Uses pill box? Yes Pt endorses good compliance  We discussed: Using inhaler as prescribed for prevention of respiratory symptoms.   Plan  Continue current medication management strategy    Follow up: 6 month phone visit

## 2020-03-22 NOTE — Patient Instructions (Signed)
Visit Information  Goals Addressed            This Visit's Progress   . Pharmacy Care Plan       CARE PLAN ENTRY (see longitudinal plan of care for additional care plan information)  Current Barriers:  . Chronic Disease Management support, education, and care coordination needs related to Hypertension, Hyperlipidemia, and COPD   Hypertension BP Readings from Last 3 Encounters:  03/19/20 110/60  03/18/20 124/66  01/16/20 (!) 144/68   . Pharmacist Clinical Goal(s): o Over the next 90 days, patient will work with PharmD and providers to maintain BP goal <140/90 . Current regimen:  o Furosemide 80 mg bid o Metoprolol Succinate 25 mg bid  . Interventions: o Reviewed medication list.  o Recommend patient work on improving diet and avoid salting food.  o Recommended elevating feet to minimize swelling.  . Patient self care activities - Over the next 90 days, patient will: o Ensure daily salt intake < 2300 mg/day  Hyperlipidemia Lab Results  Component Value Date/Time   LDLCALC 98 01/16/2020 09:21 AM   . Pharmacist Clinical Goal(s): o Over the next 90 days, patient will work with PharmD and providers to maintain LDL goal < 100 . Current regimen:  o Pravastatin 20 mg daily  . Interventions: o Recommend patient watch diet and focus on incorporating lean meats and vegetables.  o Patient unable to exercise due to back. Has upcoming surgery in February.  . Patient self care activities - Over the next 90 days, patient will: o Continue to take pravastatin as precribed.   Depression . Pharmacist Clinical Goal(s): o Over the next 90 days, patient will work with PharmD and providers to achieve goal of improved management of symptoms of crying . Current regimen:  o Citalopram 20 mg daily  . Interventions: o Discussed taking medication daily as prescribed.  . Patient self care activities - Over the next 90 days, patient will: o Contact provider with any concerns of  questions  COPD . Pharmacist Clinical Goal(s) o Over the next 90 days, patient will work with PharmD and providers to reduce risk of COPD flares.  . Current regimen:  o Albuterol prn  o Trelegy inhaler daily  . Interventions: o Continue Trelegy inhaler daily for prevention of shortness of breath or wheezing.  . Patient self care activities - Over the next 90 days, patient will: o Take Trelegy daily as prescribed.   Medication management . Pharmacist Clinical Goal(s): o Over the next 90 days, patient will work with PharmD and providers to achieve optimal medication adherence . Current pharmacy: [facility] . Interventions o Comprehensive medication review performed. o Continue current medication management strategy . Patient self care activities - Over the next 90 days, patient will: o Focus on medication adherence by continuing to use pill box.  o Take medications as prescribed o Report any questions or concerns to PharmD and/or provider(s)  Please see past updates related to this goal by clicking on the "Past Updates" button in the selected goal         The patient verbalized understanding of instructions, educational materials, and care plan provided today and declined offer to receive copy of patient instructions, educational materials, and care plan.   Telephone follow up appointment with pharmacy team member scheduled for: June 2022  Donna Little, [facility]  Living With Depression Everyone experiences occasional disappointment, sadness, and loss in their lives. When you are feeling down, blue, or sad for at least  2 weeks in a row, it may mean that you have depression. Depression can affect your thoughts and feelings, relationships, daily activities, and physical health. It is caused by changes in the way your brain functions. If you receive a diagnosis of depression, your health care provider will tell you which type of depression you have and what treatment options are  available to you. If you are living with depression, there are ways to help you recover from it and also ways to prevent it from coming back. How to cope with lifestyle changes Coping with stress     Stress is your body's reaction to life changes and events, both good and bad. Stressful situations may include:  Getting married.  The death of a spouse.  Losing a job.  Retiring.  Having a baby. Stress can last just a few hours or it can be ongoing. Stress can play a major role in depression, so it is important to learn both how to cope with stress and how to think about it differently. Talk with your health care provider or a counselor if you would like to learn more about stress reduction. He or she may suggest some stress reduction techniques, such as:  Music therapy. This can include creating music or listening to music. Choose music that you enjoy and that inspires you.  Mindfulness-based meditation. This kind of meditation can be done while sitting or walking. It involves being aware of your normal breaths, rather than trying to control your breathing.  Centering prayer. This is a kind of meditation that involves focusing on a spiritual word or phrase. Choose a word, phrase, or sacred image that is meaningful to you and that brings you peace.  Deep breathing. To do this, expand your stomach and inhale slowly through your nose. Hold your breath for 3-5 seconds, then exhale slowly, allowing your stomach muscles to relax.  Muscle relaxation. This involves intentionally tensing muscles then relaxing them. Choose a stress reduction technique that fits your lifestyle and personality. Stress reduction techniques take time and practice to develop. Set aside 5-15 minutes a day to do them. Therapists can offer training in these techniques. The training may be covered by some insurance plans. Other things you can do to manage stress include:  Keeping a stress diary. This can help you learn  what triggers your stress and ways to control your response.  Understanding what your limits are and saying no to requests or events that lead to a schedule that is too full.  Thinking about how you respond to certain situations. You may not be able to control everything, but you can control how you react.  Adding humor to your life by watching funny films or TV shows.  Making time for activities that help you relax and not feeling guilty about spending your time this way.  Medicines Your health care provider may suggest certain medicines if he or she feels that they will help improve your condition. Avoid using alcohol and other substances that may prevent your medicines from working properly (may interact). It is also important to:  Talk with your pharmacist or health care provider about all the medicines that you take, their possible side effects, and what medicines are safe to take together.  Make it your goal to take part in all treatment decisions (shared decision-making). This includes giving input on the side effects of medicines. It is best if shared decision-making with your health care provider is part of your total treatment plan.  If your health care provider prescribes a medicine, you may not notice the full benefits of it for 4-8 weeks. Most people who are treated for depression need to be on medicine for at least 6-12 months after they feel better. If you are taking medicines as part of your treatment, do not stop taking medicines without first talking to your health care provider. You may need to have the medicine slowly decreased (tapered) over time to decrease the risk of harmful side effects. Relationships Your health care provider may suggest family therapy along with individual therapy and drug therapy. While there may not be family problems that are causing you to feel depressed, it is still important to make sure your family learns as much as they can about your mental  health. Having your family's support can help make your treatment successful. How to recognize changes in your condition Everyone has a different response to treatment for depression. Recovery from major depression happens when you have not had signs of major depression for two months. This may mean that you will start to:  Have more interest in doing activities.  Feel less hopeless than you did 2 months ago.  Have more energy.  Overeat less often, or have better or improving appetite.  Have better concentration. Your health care provider will work with you to decide the next steps in your recovery. It is also important to recognize when your condition is getting worse. Watch for these signs:  Having fatigue or low energy.  Eating too much or too little.  Sleeping too much or too little.  Feeling restless, agitated, or hopeless.  Having trouble concentrating or making decisions.  Having unexplained physical complaints.  Feeling irritable, angry, or aggressive. Get help as soon as you or your family members notice these symptoms coming back. How to get support and help from others How to talk with friends and family members about your condition  Talking to friends and family members about your condition can provide you with one way to get support and guidance. Reach out to trusted friends or family members, explain your symptoms to them, and let them know that you are working with a health care provider to treat your depression. Financial resources Not all insurance plans cover mental health care, so it is important to check with your insurance carrier. If paying for co-pays or counseling services is a problem, search for a local or county mental health care center. They may be able to offer public mental health care services at low or no cost when you are not able to see a private health care provider. If you are taking medicine for depression, you may be able to get the generic  form, which may be less expensive. Some makers of prescription medicines also offer help to patients who cannot afford the medicines they need. Follow these instructions at home:   Get the right amount and quality of sleep.  Cut down on using caffeine, tobacco, alcohol, and other potentially harmful substances.  Try to exercise, such as walking or lifting small weights.  Take over-the-counter and prescription medicines only as told by your health care provider.  Eat a healthy diet that includes plenty of vegetables, fruits, whole grains, low-fat dairy products, and lean protein. Do not eat a lot of foods that are high in solid fats, added sugars, or salt.  Keep all follow-up visits as told by your health care provider. This is important. Contact a health care provider if:  You  stop taking your antidepressant medicines, and you have any of these symptoms: ? Nausea. ? Headache. ? Feeling lightheaded. ? Chills and body aches. ? Not being able to sleep (insomnia).  You or your friends and family think your depression is getting worse. Get help right away if:  You have thoughts of hurting yourself or others. If you ever feel like you may hurt yourself or others, or have thoughts about taking your own life, get help right away. You can go to your nearest emergency department or call:  Your local emergency services (911 in the [place]  A suicide crisis helpline, such as the [facility] at 9105558410. This is open 24-hours a day. Summary  If you are living with depression, there are ways to help you recover from it and also ways to prevent it from coming back.  Work with your health care team to create a management plan that includes counseling, stress management techniques, and healthy lifestyle habits. This information is not intended to replace advice given to you by your health care provider. Make sure you discuss any questions you have with your health  care provider. Document Revised: 07/15/2018 Document Reviewed: 02/24/2016 Elsevier Patient Education  [place].

## 2020-03-25 ENCOUNTER — Encounter: Payer: Self-pay | Admitting: Family Medicine

## 2020-04-02 ENCOUNTER — Ambulatory Visit: Payer: Medicare Other

## 2020-04-03 NOTE — Progress Notes (Signed)
Subjective:  Patient ID: Donna Little, female    DOB: Jan 29, 1946  Age: 73 y.o. MRN: [id number]  Chief Complaint  Patient presents with  . Shoulder Pain    HPI Right patient presents complaining of issues with her right hand swelling being painful when she awakens in the morning.  This been going on for 2 months.  In addition last night her right shoulder began hurting.  Her anterior shoulder is numb. She has a history of needing a vascular stent to her rt arm several years ago.   Current Outpatient Medications on File Prior to Visit  Medication Sig Dispense Refill  . albuterol (VENTOLIN HFA) 108 (90 Base) MCG/ACT inhaler Inhale 2 puffs into the lungs every 6 (six) hours as needed.     . citalopram (CELEXA) 20 MG tablet Take 1 tablet by mouth once daily 90 tablet 0  . clopidogrel (PLAVIX) 75 MG tablet Take 75 mg by mouth daily.    Marland Kitchen denosumab (PROLIA) 60 MG/ML SOSY injection Inject 60 mg into the skin every 6 (six) months.    . diclofenac Sodium (VOLTAREN) 1 % GEL Apply 2 g topically 4 (four) times daily as needed.     . dicyclomine (BENTYL) 10 MG capsule TAKE 1 CAPSULE BY MOUTH 4 TIMES DAILY BEFORE MEAL(S) AND AT BEDTIME 90 capsule 0  . Fluticasone-Umeclidin-Vilant (TRELEGY ELLIPTA) 100-62.5-25 MCG/INH AEPB INHALE 1 PUFF ONCE DAILY USE AT THE SAME TIME EACH DAY 1 each 1  . furosemide (LASIX) 40 MG tablet Take 2 tablets by mouth twice daily 360 tablet 0  . gabapentin (NEURONTIN) 800 MG tablet TAKE 1 TABLET BY MOUTH THREE TIMES DAILY 270 tablet 1  . meclizine (ANTIVERT) 25 MG tablet Take 25 mg by mouth 3 (three) times daily as needed.     . metoprolol succinate (TOPROL-XL) 25 MG 24 hr tablet Take 25 mg by mouth in the morning and at bedtime.     Marland Kitchen morphine (MS CONTIN) 30 MG 12 hr tablet Take 1 tablet (30 mg total) by mouth in the morning, at noon, and at bedtime. 90 tablet 0  . nitroGLYCERIN (NITROSTAT) 0.4 MG SL tablet Place under the tongue.    . ondansetron (ZOFRAN-ODT) 4 MG  disintegrating tablet DISSOLVE 1 TO 2 TABLETS IN MOUTH EVERY 8 HOURS AS NEEDED FOR NAUSEA 40 tablet 0  . pantoprazole (PROTONIX) 40 MG tablet Take 1 tablet by mouth once daily 90 tablet 1  . Potassium Chloride ER 20 MEQ TBCR Take 1 tablet by mouth twice daily with food 180 tablet 0  . pravastatin (PRAVACHOL) 20 MG tablet Take 1 tablet (20 mg total) by mouth daily. 90 tablet 0   No current facility-administered medications on file prior to visit.   Past Medical History:  Diagnosis Date  . A-fib ([place])   . Chronic pain syndrome   . Chronic systolic heart failure ([place])   . Depression   . Generalized atherosclerosis   . GERD (gastroesophageal reflux disease)   . Hypertension   . IBS (irritable bowel syndrome)   . Melanoma ([place])   . Other specified nontoxic goiter   . Palpitations   . Polymyalgia rheumatica ([place])   . Primary pulmonary hypertension ([place])   . Pulmonary embolism ([place])   . Stroke ([place])    ministroke  . Supraventricular tachycardia ([place])   . Type 2 diabetes mellitus ([place])    Past Surgical History:  Procedure Laterality Date  . ABDOMINAL HYSTERECTOMY    . APPENDECTOMY    .  CATARACT EXTRACTION    . CORONARY ANGIOPLASTY WITH STENT PLACEMENT      Family History  Problem Relation Age of Onset  . Diabetes Other   . Stroke Other   . Heart disease Other    Social History   Socioeconomic History  . Marital status: Single    Spouse name: Not on file  . Number of children: 2  . Years of education: Not on file  . Highest education level: Not on file  Occupational History  . Occupation: retired  Tobacco Use  . Smoking status: Former Smoker    Packs/day: 0.50    Years: 50.00    Pack years: 25.00  . Smokeless tobacco: Never Used  Substance and Sexual Activity  . Alcohol use: No    Alcohol/week: 0.0 standard drinks  . Drug use: No  . Sexual activity: Not on file  Other Topics Concern  . Not on file  Social History Narrative  . Not on file   Social Determinants  of Health   Financial Resource Strain: Not on file  Food Insecurity: No Food Insecurity  . Worried About Programme researcher, broadcasting/film/video in the Last Year: Never true  . Ran Out of Food in the Last Year: Never true  Transportation Needs: Not on file  Physical Activity: Not on file  Stress: Not on file  Social Connections: Not on file    Review of Systems  Constitutional: Negative for chills, fatigue and fever.  HENT: Negative for congestion, ear pain and sore throat.   Respiratory: Negative for cough and shortness of breath.   Cardiovascular: Negative for chest pain.     Objective:  BP (!) 92/58   Pulse 72   Temp 97.7 F (36.5 C)   Resp 16   Ht 5\' 3"  (1.6 m)   Wt 127 lb (57.6 kg)   BMI 22.50 kg/m   BP/Weight 04/04/2020 03/19/2020 03/18/2020  Systolic BP 92 110 124  Diastolic BP 58 60 66  Wt. (Lbs) 127 124 126  BMI 22.5 21.97 22.32    Physical Exam Vitals reviewed.  Constitutional:      Appearance: Normal appearance. She is normal weight.  Cardiovascular:     Pulses: Normal pulses.  Musculoskeletal:     Comments: Abnormal ROM. Only able to abduct to 90 degrees on left and right arm. Unable to fully externally rotate rt shoulder. Internal rotation is normal.  NOntender over neck.  Pt is flexed at her lumbar spine due to chronic back pain and compression fractures.   Neurological:     Mental Status: She is alert and oriented to person, place, and time.     Sensory: Sensory deficit (paresthesias of right hand.) present.  Psychiatric:        Mood and Affect: Mood normal.        Behavior: Behavior normal.     Lab Results  Component Value Date   WBC 7.5 12/26/2019   HGB 13.5 12/26/2019   HCT 41.3 12/26/2019   PLT 236 12/26/2019   GLUCOSE 86 01/16/2020   CHOL 157 01/16/2020   TRIG 136 01/16/2020   HDL 35 (L) 01/16/2020   LDLCALC 98 01/16/2020   ALT 8 01/16/2020   AST 20 01/16/2020   NA 139 01/16/2020   K 4.5 01/16/2020   CL 100 01/16/2020   CREATININE 0.77  01/16/2020   BUN 13 01/16/2020   CO2 27 01/16/2020   TSH 1.050 12/26/2019   HGBA1C 5.8 (H) 01/16/2020   MICROALBUR 30  01/20/2020      Assessment & Plan:   1. Acute pain of right shoulder - triamcinolone acetonide (KENALOG-40) injection 40 mg Risks were discussed including bleeding, infection, increase in sugars if diabetic, atrophy at site of injection, and increased pain.  After consent was obtained, using sterile technique the posterior shoulder was prepped with  alcohol.  The joint was entered and kenalog 40 mg and 5 ml plain Lidocaine was then injected and the needle withdrawn.  The procedure was well tolerated.   The patient is asked to continue to rest the joint for a few more days before resuming regular activities.  It may be more painful for the first 1-2 days.  Watch for fever, or increased swelling or persistent pain in the joint. Call or return to clinic prn if such symptoms occur or there is failure to improve as anticipated.  2. Paresthesia of right arm  If no improvement in 1 week, pt to take course of prednisone as it may be cervical radiculopathy.  Pt to call back if not better and will order cspine xray or have her address with Dr. Prince Rome her neurosurgery.   Follow-up: No follow-ups on file.  An After Visit Summary was printed and given to the patient.  [name], MD [facility] [phone]

## 2020-04-04 ENCOUNTER — Other Ambulatory Visit: Payer: Self-pay

## 2020-04-04 ENCOUNTER — Other Ambulatory Visit: Payer: Self-pay | Admitting: Family Medicine

## 2020-04-04 ENCOUNTER — Encounter: Payer: Self-pay | Admitting: Family Medicine

## 2020-04-04 ENCOUNTER — Ambulatory Visit (INDEPENDENT_AMBULATORY_CARE_PROVIDER_SITE_OTHER): Payer: Medicare Other | Admitting: Family Medicine

## 2020-04-04 VITALS — BP 92/58 | HR 72 | Temp 97.7°F | Resp 16 | Ht 63.0 in | Wt 127.0 lb

## 2020-04-04 DIAGNOSIS — R202 Paresthesia of skin: Secondary | ICD-10-CM

## 2020-04-04 DIAGNOSIS — M25511 Pain in right shoulder: Secondary | ICD-10-CM

## 2020-04-04 MED ORDER — PREDNISONE 50 MG PO TABS: 50.0000 mg | ORAL_TABLET | Freq: Every day | ORAL | 0 refills | Status: DC

## 2020-04-04 MED ORDER — TRIAMCINOLONE ACETONIDE 40 MG/ML IJ SUSP: 40.0000 mg | Freq: Once | INTRAMUSCULAR | Status: DC

## 2020-04-04 NOTE — Progress Notes (Deleted)
Acute Office Visit  Subjective:    Patient ID: Donna Little, female    DOB: 31-Jul-1945, 74 y.o.   MRN: [id number]  No chief complaint on file.   HPI Patient is in today for ***  Past Medical History:  Diagnosis Date  . A-fib ([place])   . Chronic pain syndrome   . Chronic systolic heart failure ([place])   . Depression   . Generalized atherosclerosis   . GERD (gastroesophageal reflux disease)   . Hypertension   . IBS (irritable bowel syndrome)   . Melanoma ([place])   . Other specified nontoxic goiter   . Palpitations   . Polymyalgia rheumatica ([place])   . Primary pulmonary hypertension ([place])   . Pulmonary embolism ([place])   . Stroke ([place])    ministroke  . Supraventricular tachycardia ([place])   . Type 2 diabetes mellitus ([place])     Past Surgical History:  Procedure Laterality Date  . ABDOMINAL HYSTERECTOMY    . APPENDECTOMY    . CATARACT EXTRACTION    . CORONARY ANGIOPLASTY WITH STENT PLACEMENT      Family History  Problem Relation Age of Onset  . Diabetes Other   . Stroke Other   . Heart disease Other     Social History   Socioeconomic History  . Marital status: Single    Spouse name: Not on file  . Number of children: 2  . Years of education: Not on file  . Highest education level: Not on file  Occupational History  . Occupation: retired  Tobacco Use  . Smoking status: Former Smoker    Packs/day: 0.50    Years: 50.00    Pack years: 25.00  . Smokeless tobacco: Never Used  Substance and Sexual Activity  . Alcohol use: No    Alcohol/week: 0.0 standard drinks  . Drug use: No  . Sexual activity: Not on file  Other Topics Concern  . Not on file  Social History Narrative  . Not on file   Social Determinants of Health   Financial Resource Strain: Not on file  Food Insecurity: No Food Insecurity  . Worried About Charity fundraiser in the Last Year: Never true  . Ran Out of Food in the Last Year: Never true  Transportation Needs: Not on file  Physical  Activity: Not on file  Stress: Not on file  Social Connections: Not on file  Intimate Partner Violence: Not on file    Outpatient Medications Prior to Visit  Medication Sig Dispense Refill  . albuterol (VENTOLIN HFA) 108 (90 Base) MCG/ACT inhaler Inhale 2 puffs into the lungs every 6 (six) hours as needed.     . citalopram (CELEXA) 20 MG tablet Take 1 tablet by mouth once daily 90 tablet 0  . clopidogrel (PLAVIX) 75 MG tablet Take 75 mg by mouth daily.    Marland Kitchen denosumab (PROLIA) 60 MG/ML SOSY injection Inject 60 mg into the skin every 6 (six) months.    . diclofenac Sodium (VOLTAREN) 1 % GEL Apply 2 g topically 4 (four) times daily as needed.     . dicyclomine (BENTYL) 10 MG capsule TAKE 1 CAPSULE BY MOUTH 4 TIMES DAILY BEFORE MEAL(S) AND AT BEDTIME 90 capsule 0  . Fluticasone-Umeclidin-Vilant (TRELEGY ELLIPTA) 100-62.5-25 MCG/INH AEPB INHALE 1 PUFF ONCE DAILY USE AT THE SAME TIME EACH DAY 1 each 1  . furosemide (LASIX) 40 MG tablet Take 2 tablets by mouth twice daily 360 tablet 0  . gabapentin (NEURONTIN) 800 MG  tablet TAKE 1 TABLET BY MOUTH THREE TIMES DAILY 270 tablet 1  . meclizine (ANTIVERT) 25 MG tablet Take 25 mg by mouth 3 (three) times daily as needed.     . metoprolol succinate (TOPROL-XL) 25 MG 24 hr tablet Take 25 mg by mouth in the morning and at bedtime.     Marland Kitchen morphine (MS CONTIN) 30 MG 12 hr tablet Take 1 tablet (30 mg total) by mouth in the morning, at noon, and at bedtime. 90 tablet 0  . nitroGLYCERIN (NITROSTAT) 0.4 MG SL tablet Place under the tongue.    . ondansetron (ZOFRAN-ODT) 4 MG disintegrating tablet DISSOLVE 1 TO 2 TABLETS IN MOUTH EVERY 8 HOURS AS NEEDED FOR NAUSEA 40 tablet 0  . pantoprazole (PROTONIX) 40 MG tablet Take 1 tablet by mouth once daily 90 tablet 1  . Potassium Chloride ER 20 MEQ TBCR Take 1 tablet by mouth twice daily with food 180 tablet 0  . pravastatin (PRAVACHOL) 20 MG tablet Take 1 tablet (20 mg total) by mouth daily. 90 tablet 0    Facility-Administered Medications Prior to Visit  Medication Dose Route Frequency Provider Last Rate Last Admin  . triamcinolone acetonide (KENALOG-40) injection 20 mg  20 mg Intra-articular Once [name], MD        Allergies  Allergen Reactions  . Codeine   . Methocarbamol   . Sertraline     Insomnia   . Tizanidine     Review of Systems  Constitutional: Positive for fatigue. Negative for chills and fever.  HENT: Negative for congestion, rhinorrhea and sore throat.   Respiratory: Positive for cough and shortness of breath.   Cardiovascular: Negative for chest pain, palpitations and leg swelling.  Gastrointestinal: Negative for abdominal pain, constipation, diarrhea, nausea and vomiting.  Genitourinary: Negative for dysuria and urgency.  Musculoskeletal: Positive for arthralgias, back pain and myalgias.  Neurological: Negative for dizziness, weakness, light-headedness and headaches.  Psychiatric/Behavioral: Positive for dysphoric mood. The patient is not nervous/anxious.        Objective:    Physical Exam  BP (!) 92/58   Pulse 72   Temp 97.7 F (36.5 C)   Resp 16   Ht 5\' 3"  (1.6 m)   Wt 127 lb (57.6 kg)   BMI 22.50 kg/m  Wt Readings from Last 3 Encounters:  04/04/20 127 lb (57.6 kg)  03/19/20 124 lb (56.2 kg)  03/18/20 126 lb (57.2 kg)    Health Maintenance Due  Topic Date Due  . Hepatitis C Screening  Never done  . TETANUS/TDAP  Never done    There are no preventive care reminders to display for this patient.   Lab Results  Component Value Date   TSH 1.050 12/26/2019   Lab Results  Component Value Date   WBC 7.5 12/26/2019   HGB 13.5 12/26/2019   HCT 41.3 12/26/2019   MCV 94 12/26/2019   PLT 236 12/26/2019   Lab Results  Component Value Date   NA 139 01/16/2020   K 4.5 01/16/2020   CO2 27 01/16/2020   GLUCOSE 86 01/16/2020   BUN 13 01/16/2020   CREATININE 0.77 01/16/2020   BILITOT 0.3 01/16/2020   ALKPHOS 123 (H) 01/16/2020   AST  20 01/16/2020   ALT 8 01/16/2020   PROT 7.2 01/16/2020   ALBUMIN 4.2 01/16/2020   CALCIUM 8.9 01/16/2020   Lab Results  Component Value Date   CHOL 157 01/16/2020   Lab Results  Component Value Date   HDL 35 (  L) 01/16/2020   Lab Results  Component Value Date   LDLCALC 98 01/16/2020   Lab Results  Component Value Date   TRIG 136 01/16/2020   Lab Results  Component Value Date   CHOLHDL 4.5 (H) 01/16/2020   Lab Results  Component Value Date   HGBA1C 5.8 (H) 01/16/2020       Assessment & Plan:  1. Acute pain of right shoulder  2. Paresthesia of right arm    No orders of the defined types were placed in this encounter.   No orders of the defined types were placed in this encounter.    I spent < time > minutes dedicated to the care of this patient on the date of this encounter to include face-to-face time with the patient, as well as: ***  Follow-up: No follow-ups on file.  An After Visit Summary was printed and given to the patient.  Blane Ohara, MD [facility] [phone]

## 2020-04-06 DIAGNOSIS — I6523 Occlusion and stenosis of bilateral carotid arteries: Secondary | ICD-10-CM

## 2020-04-06 DIAGNOSIS — J439 Emphysema, unspecified: Secondary | ICD-10-CM

## 2020-04-06 HISTORY — DX: Occlusion and stenosis of bilateral carotid arteries: I65.23

## 2020-04-06 HISTORY — DX: Emphysema, unspecified: J43.9

## 2020-04-15 ENCOUNTER — Other Ambulatory Visit: Payer: Self-pay

## 2020-04-15 ENCOUNTER — Ambulatory Visit: Payer: Medicare Other | Admitting: Family Medicine

## 2020-04-15 MED ORDER — MORPHINE SULFATE ER 30 MG PO TBCR: 30.0000 mg | EXTENDED_RELEASE_TABLET | Freq: Three times a day (TID) | ORAL | 0 refills | Status: DC

## 2020-04-15 MED ORDER — CITALOPRAM HYDROBROMIDE 20 MG PO TABS
20.0000 mg | ORAL_TABLET | Freq: Every day | ORAL | 0 refills | Status: DC
Start: 2020-04-15 — End: 2020-06-25

## 2020-04-29 ENCOUNTER — Telehealth: Payer: Self-pay

## 2020-04-29 NOTE — Progress Notes (Signed)
Chronic Care Management Pharmacy Assistant   Name: Donna Little  MRN: [id number] DOB: [birth year]  Reason for Encounter: Disease State call for COPD  Patient Questions:  1.  Have you seen any other providers since your last visit?  Yes, PCP on 04/04/20 for right shoulder/hand pain, she got a Kenalog injection  2.  Any changes in your medicines or health? yes, Prednisone 50mg  (if injection didn't iimprove pain) Triamcinolone 40mg .    PCP : [name], MD  Allergies:   Allergies  Allergen Reactions  . Codeine   . Methocarbamol   . Sertraline     Insomnia   . Tizanidine     Medications: Outpatient Encounter Medications as of 04/29/2020  Medication Sig  . albuterol (VENTOLIN HFA) 108 (90 Base) MCG/ACT inhaler Inhale 2 puffs into the lungs every 6 (six) hours as needed.   . citalopram (CELEXA) 20 MG tablet Take 1 tablet (20 mg total) by mouth daily.  . clopidogrel (PLAVIX) 75 MG tablet Take 75 mg by mouth daily.  Marland Kitchen denosumab (PROLIA) 60 MG/ML SOSY injection Inject 60 mg into the skin every 6 (six) months.  . diclofenac Sodium (VOLTAREN) 1 % GEL Apply 2 g topically 4 (four) times daily as needed.   . dicyclomine (BENTYL) 10 MG capsule TAKE 1 CAPSULE BY MOUTH 4 TIMES DAILY BEFORE MEAL(S) AND AT BEDTIME  . Fluticasone-Umeclidin-Vilant (TRELEGY ELLIPTA) 100-62.5-25 MCG/INH AEPB INHALE 1 PUFF ONCE DAILY USE AT THE SAME TIME EACH DAY  . furosemide (LASIX) 40 MG tablet Take 2 tablets by mouth twice daily  . gabapentin (NEURONTIN) 800 MG tablet TAKE 1 TABLET BY MOUTH THREE TIMES DAILY  . meclizine (ANTIVERT) 25 MG tablet Take 25 mg by mouth 3 (three) times daily as needed.   . metoprolol succinate (TOPROL-XL) 25 MG 24 hr tablet Take 25 mg by mouth in the morning and at bedtime.   Marland Kitchen morphine (MS CONTIN) 30 MG 12 hr tablet Take 1 tablet (30 mg total) by mouth in the morning, at noon, and at bedtime.  . nitroGLYCERIN (NITROSTAT) 0.4 MG SL tablet Place under the tongue.  . ondansetron  (ZOFRAN-ODT) 4 MG disintegrating tablet DISSOLVE 1 TO 2 TABLETS IN MOUTH EVERY 8 HOURS AS NEEDED FOR NAUSEA  . pantoprazole (PROTONIX) 40 MG tablet Take 1 tablet by mouth once daily  . Potassium Chloride ER 20 MEQ TBCR Take 1 tablet by mouth twice daily with food  . pravastatin (PRAVACHOL) 20 MG tablet Take 1 tablet (20 mg total) by mouth daily.  . predniSONE (DELTASONE) 50 MG tablet Take 1 tablet (50 mg total) by mouth daily with breakfast.   Facility-Administered Encounter Medications as of 04/29/2020  Medication  . triamcinolone acetonide (KENALOG-40) injection 40 mg    Current Diagnosis: Patient Active Problem List   Diagnosis Date Noted  . Right hip pain 09/21/2019  . Contusion of scalp 09/21/2019  . Rash 09/21/2019  . Confusion 09/21/2019  . Back pain with sciatica 08/21/2019  . Incontinence of urine in female 08/21/2019  . Trigger finger, right ring finger 06/26/2019  . Pulmonary embolism ([place]) 01/12/2017  . Cerebrovascular disease 01/16/2015  . Hyperlipidemia 01/16/2015  . 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 Tobacco abuse 01/16/2015   Spoke to patient and she is doing ok.  She uses her inhaler every other day, due to when she was using it daily it caused a sore throat.  She stated she has not had any recent flare-ups.  Patient  stated her shoulder has improved some, still has numbness and tingling.  She is also scheduled for a back procedure in Feb. 2022 to have cement put into her vertebrae.   Patient stated there are days she can't take the pain, but then it will ease off.  Patient stated she is taking her other medications as directed and there are no current problems with any of those.   Follow-Up:  Pharmacist Review  Donette Larry, CPP notified  Clarita Leber, [place] Pharmacist Assistant [phone]

## 2020-05-07 HISTORY — PX: KYPHOPLASTY: SHX5884

## 2020-05-08 DIAGNOSIS — Z86711 Personal history of pulmonary embolism: Secondary | ICD-10-CM | POA: Diagnosis not present

## 2020-05-08 DIAGNOSIS — J449 Chronic obstructive pulmonary disease, unspecified: Secondary | ICD-10-CM | POA: Diagnosis not present

## 2020-05-08 DIAGNOSIS — K219 Gastro-esophageal reflux disease without esophagitis: Secondary | ICD-10-CM | POA: Diagnosis not present

## 2020-05-08 DIAGNOSIS — I739 Peripheral vascular disease, unspecified: Secondary | ICD-10-CM | POA: Diagnosis not present

## 2020-05-08 DIAGNOSIS — Z955 Presence of coronary angioplasty implant and graft: Secondary | ICD-10-CM | POA: Diagnosis not present

## 2020-05-08 DIAGNOSIS — Z79899 Other long term (current) drug therapy: Secondary | ICD-10-CM | POA: Diagnosis not present

## 2020-05-08 DIAGNOSIS — F17210 Nicotine dependence, cigarettes, uncomplicated: Secondary | ICD-10-CM | POA: Diagnosis not present

## 2020-05-08 DIAGNOSIS — I5032 Chronic diastolic (congestive) heart failure: Secondary | ICD-10-CM | POA: Diagnosis not present

## 2020-05-08 DIAGNOSIS — E7849 Other hyperlipidemia: Secondary | ICD-10-CM | POA: Diagnosis not present

## 2020-05-08 DIAGNOSIS — M8008XA Age-related osteoporosis with current pathological fracture, vertebra(e), initial encounter for fracture: Secondary | ICD-10-CM | POA: Diagnosis not present

## 2020-05-08 DIAGNOSIS — F32A Depression, unspecified: Secondary | ICD-10-CM | POA: Diagnosis not present

## 2020-05-08 DIAGNOSIS — F419 Anxiety disorder, unspecified: Secondary | ICD-10-CM | POA: Diagnosis not present

## 2020-05-08 DIAGNOSIS — I110 Hypertensive heart disease with heart failure: Secondary | ICD-10-CM | POA: Diagnosis not present

## 2020-05-08 DIAGNOSIS — S32050A Wedge compression fracture of fifth lumbar vertebra, initial encounter for closed fracture: Secondary | ICD-10-CM | POA: Diagnosis not present

## 2020-05-08 DIAGNOSIS — Z8673 Personal history of transient ischemic attack (TIA), and cerebral infarction without residual deficits: Secondary | ICD-10-CM | POA: Diagnosis not present

## 2020-05-08 DIAGNOSIS — Z7901 Long term (current) use of anticoagulants: Secondary | ICD-10-CM | POA: Diagnosis not present

## 2020-05-08 DIAGNOSIS — Z9889 Other specified postprocedural states: Secondary | ICD-10-CM | POA: Diagnosis not present

## 2020-05-08 DIAGNOSIS — I2510 Atherosclerotic heart disease of native coronary artery without angina pectoris: Secondary | ICD-10-CM | POA: Diagnosis not present

## 2020-05-09 DIAGNOSIS — M8008XA Age-related osteoporosis with current pathological fracture, vertebra(e), initial encounter for fracture: Secondary | ICD-10-CM | POA: Diagnosis not present

## 2020-05-09 DIAGNOSIS — E7849 Other hyperlipidemia: Secondary | ICD-10-CM | POA: Diagnosis not present

## 2020-05-09 DIAGNOSIS — I5032 Chronic diastolic (congestive) heart failure: Secondary | ICD-10-CM | POA: Diagnosis not present

## 2020-05-09 DIAGNOSIS — I110 Hypertensive heart disease with heart failure: Secondary | ICD-10-CM | POA: Diagnosis not present

## 2020-05-09 DIAGNOSIS — Z955 Presence of coronary angioplasty implant and graft: Secondary | ICD-10-CM | POA: Diagnosis not present

## 2020-05-09 DIAGNOSIS — I2510 Atherosclerotic heart disease of native coronary artery without angina pectoris: Secondary | ICD-10-CM | POA: Diagnosis not present

## 2020-05-14 ENCOUNTER — Telehealth: Payer: Self-pay

## 2020-05-14 DIAGNOSIS — S199XXA Unspecified injury of neck, initial encounter: Secondary | ICD-10-CM | POA: Diagnosis not present

## 2020-05-14 DIAGNOSIS — S3991XA Unspecified injury of abdomen, initial encounter: Secondary | ICD-10-CM | POA: Diagnosis not present

## 2020-05-14 DIAGNOSIS — S0993XA Unspecified injury of face, initial encounter: Secondary | ICD-10-CM | POA: Diagnosis not present

## 2020-05-14 DIAGNOSIS — S22089A Unspecified fracture of T11-T12 vertebra, initial encounter for closed fracture: Secondary | ICD-10-CM | POA: Diagnosis not present

## 2020-05-14 DIAGNOSIS — S32009A Unspecified fracture of unspecified lumbar vertebra, initial encounter for closed fracture: Secondary | ICD-10-CM | POA: Diagnosis not present

## 2020-05-14 DIAGNOSIS — R4182 Altered mental status, unspecified: Secondary | ICD-10-CM | POA: Diagnosis not present

## 2020-05-14 DIAGNOSIS — S2249XA Multiple fractures of ribs, unspecified side, initial encounter for closed fracture: Secondary | ICD-10-CM | POA: Diagnosis not present

## 2020-05-14 NOTE — Telephone Encounter (Signed)
Freda Munro called this morning stating that Donna Little fell yesterday and hit her head. She is on blood thinner and now has a black eye. Per Dr. Tobie Poet pt needs to go to the hospital. Freda Munro was notified.

## 2020-05-16 ENCOUNTER — Other Ambulatory Visit: Payer: Self-pay

## 2020-05-16 ENCOUNTER — Encounter: Payer: Self-pay | Admitting: Nurse Practitioner

## 2020-05-16 ENCOUNTER — Other Ambulatory Visit: Payer: Self-pay | Admitting: Family Medicine

## 2020-05-16 ENCOUNTER — Telehealth: Payer: Self-pay

## 2020-05-16 ENCOUNTER — Ambulatory Visit (INDEPENDENT_AMBULATORY_CARE_PROVIDER_SITE_OTHER): Payer: Medicare Other | Admitting: Nurse Practitioner

## 2020-05-16 VITALS — BP 122/68 | HR 86 | Temp 97.4°F | Ht 63.0 in | Wt 116.0 lb

## 2020-05-16 DIAGNOSIS — I700 Atherosclerosis of aorta: Secondary | ICD-10-CM

## 2020-05-16 DIAGNOSIS — M8000XD Age-related osteoporosis with current pathological fracture, unspecified site, subsequent encounter for fracture with routine healing: Secondary | ICD-10-CM | POA: Diagnosis not present

## 2020-05-16 DIAGNOSIS — W19XXXD Unspecified fall, subsequent encounter: Secondary | ICD-10-CM

## 2020-05-16 DIAGNOSIS — R4701 Aphasia: Secondary | ICD-10-CM | POA: Diagnosis not present

## 2020-05-16 DIAGNOSIS — Z79899 Other long term (current) drug therapy: Secondary | ICD-10-CM

## 2020-05-16 DIAGNOSIS — R4182 Altered mental status, unspecified: Secondary | ICD-10-CM | POA: Diagnosis not present

## 2020-05-16 DIAGNOSIS — R410 Disorientation, unspecified: Secondary | ICD-10-CM

## 2020-05-16 DIAGNOSIS — R443 Hallucinations, unspecified: Secondary | ICD-10-CM | POA: Diagnosis not present

## 2020-05-16 DIAGNOSIS — S0990XA Unspecified injury of head, initial encounter: Secondary | ICD-10-CM | POA: Diagnosis not present

## 2020-05-16 DIAGNOSIS — Z7901 Long term (current) use of anticoagulants: Secondary | ICD-10-CM | POA: Diagnosis not present

## 2020-05-16 NOTE — Patient Instructions (Addendum)
Take Pantoprazole daily for GERD Take Zofran 4mg  30 minutes prior to Morphine pain medication We will call you with appointment with pharmacist for evaluation of medication Stat CT of head today at [facility] at 1:30 today Return in 2 weeks with Dr Cox  CT Scan  A CT scan (computed tomography scan) is an imaging scan. It uses X-rays and a computer to make detailed pictures of different areas inside the body. A CT scan can give more information than a regular X-ray exam. A CT scan provides data about internal organs, soft tissue structures, blood vessels, and bones. In this procedure, the pictures will be taken in a large machine that has an opening (CT scanner). Tell a health care provider about:  Any allergies you have.  All medicines you are taking, including vitamins, herbs, eye drops, creams, and over-the-counter medicines.  Any blood disorders you have.  Any surgeries you have had.  Any medical conditions you have.  Whether you are pregnant or may be pregnant. What are the risks? Generally, this is a safe procedure. However, problems may occur, including:  An allergic reaction to dyes.  Development of cancer from excessive exposure to radiation from multiple CT scans. This is rare. What happens before the procedure? Staying hydrated Follow instructions from your health care provider about hydration, which may include:  Up to 2 hours before the procedure - you may continue to drink clear liquids, such as water, clear fruit juice, black coffee, and plain tea. Eating and drinking restrictions Follow instructions from your health care provider about eating and drinking, which may include:  24 hours before the procedure - stop drinking caffeinated beverages, such as energy drinks, tea, soda, coffee, and hot chocolate.  8 hours before the procedure - stop eating heavy meals or foods such as meat, fried foods, or fatty foods.  6 hours before the procedure - stop eating  light meals or foods, such as toast or cereal.  6 hours before the procedure - stop drinking milk or drinks that contain milk.  2 hours before the procedure - stop drinking clear liquids. General instructions  Remove any jewelry.  Ask your health care provider about changing or stopping your regular medicines. This is especially important if you are taking diabetes medicines or blood thinners. What happens during the procedure?  You will lie on a table with your arms above your head.  An IV tube may be inserted into one of your veins.  The contrast dye may be injected into the IV tube. You may feel warm or have a metallic taste in your mouth.  The table you will be lying on will move into the CT scanner.  You will be able to see, hear, and talk to the person running the machine while you are in it. Follow that person's instructions.  The CT scanner will move around you to take pictures. Do not move while it is scanning. Staying still helps the scanner to get a good image.  When the best possible pictures have been taken, the machine will be turned off. The table will be moved out of the machine.  The IV tube will be removed. The procedure may vary among health care providers and hospitals. What happens after the procedure?  It is up to you to get the results of your procedure. Ask your health care provider, or the department that is doing the procedure, when your results will be ready. Summary  A CT scan is an imaging scan.  A CT scan uses X-rays and a computer to make detailed pictures of different areas of your body.  Follow instructions from your health care provider about eating and drinking before the procedure.  You will be able to see, hear, and talk to the person running the machine while you are in it. Follow that person's instructions. This information is not intended to replace advice given to you by your health care provider. Make sure you discuss any questions you  have with your health care provider. Document Revised: 08/08/2018 Document Reviewed: 04/25/2016 Elsevier Patient Education  2021 [place] of Medicine Management Taking your medicines correctly is an important part of managing or preventing medical problems. Make sure you know what disease or condition your medicine is treating, and how and when to take it. If you do not take your medicine correctly, it may not work well and may cause unpleasant side effects, including serious health problems. What should I do when I am taking medicines?  Read all the labels and inserts that come with your medicines. Review the information often.  Talk with your pharmacist if you get a refill and notice a change in the size, color, or shape of your medicines.  Know the potential side effects for each medicine that you take.  Try to get all your medicines from the same pharmacy. The pharmacist will have all your information and will understand how your medicines will affect each other (interact).  Tell your health care provider about all your medicines, including over-the-counter medicines, vitamins, and herbal or dietary supplements. He or she will make sure that nothing will interact with any of your prescribed medicines.   How can I take my medicines safely?  Take medicines only as told by your health care provider. ? Do not take more of your medicine than instructed. ? Do not take anyone else's medicines. ? Do not share your medicines with others. ? Do not stop taking your medicines unless your health care provider tells you to do so. ? You may need to avoid alcohol or certain foods or liquids when taking certain medicines. Follow your health care provider's instructions.  Do not split, mash, or chew your medicines unless your health care provider tells you to do so. Tell your health care provider if you have trouble swallowing your medicines.  For liquid medicine, use the dosing  container that was provided. How should I organize my medicines? Know your medicines  Know what each of your medicines looks like. This includes size, color, and shape. Tell your health care provider if you are having trouble recognizing all the medicines that you are taking.  If you cannot tell your medicines apart because they look similar, keep them in original bottles.  If you cannot read the labels on the bottles, tell your pharmacist to put your medicines in containers with large print.  Review your medicines and your schedule with family members, a friend, or a caregiver. Use a pill organizer  Use a tool to organize your medicine schedule. Tools include a weekly pillbox, a written chart, a notebook, or a calendar.  Your tool should help you remember the following things about each medicine: ? The name of the medicine. ? The amount (dose) to take. ? The schedule. This is the day and time the medicine should be taken. ? The appearance. This includes color, shape, size, and stamp. ? How to take your medicines. This includes instructions to take them with food, without  food, with fluids, or with other medicines.  Create reminders for taking your medicines. Use sticky notes, or alarms on your watch, mobile device, or phone calendar.  You may choose to use a more advanced management system. These systems have storage, alarms, and visual and audio prompts.  Some medicines can be taken on an "as-needed" basis. These include medicines for nausea or pain. If you take an as-needed medicine, write down the name and dose, as well as the date and time that you took it.   How should I plan for travel?  Take your pillbox, medicines, and organization system with you when traveling.  Have your medicines refilled before you travel. This will ensure that you do not run out of your medicines while you are away from home.  Always carry an updated list of your medicines with you. If there is an  emergency, a first responder can quickly see what medicines you are taking.  Do not pack your medicines in checked luggage in case your luggage is lost or delayed.  If any of your medicines is considered a controlled substance, make sure you bring a letter from your health care provider with you. How should I store and discard my medicines? For safe storage:  Store medicines in a cool, dry area away from light, or as directed by your health care provider. Do not store medicines in the bathroom. Heat and humidity will affect them.  Do not store your medicines with other chemicals, or with medicines for pets or other household members.  Keep medicines away from children and pets. Do not leave them on counters or bedside tables. Store them in high cabinets or on high shelves. For safe disposal:  Check expiration dates regularly. Do not take expired medicines. Discard medicines that are older than the expiration date.  Learn a safe way to dispose of your medicines. You may: ? Use a local government, hospital, or pharmacy medicine-take-back program. ? Mix the medicines with inedible substances, put them in a sealed bag or empty container, and throw them in the trash. What should I remember?  Tell your health care provider if you: ? Experience side effects. ? Have new symptoms. ? Have other concerns about taking your medicines.  Review your medicines regularly with your health care provider. Other medicines, diet, medical conditions, weight changes, and daily habits can all affect how medicines work. Ask if you need to continue taking each medicine, and discuss how well each one is working.  Refill your medicines early to avoid running out of them.  In case of an accidental overdose, call your local [place] at [phone] or visit your local emergency department immediately. This is important. Summary  Taking your medicines correctly is an important part of managing or  preventing medical problems.  You need to make sure that you understand what you are taking a medicine for, as well as how and when you need to take it.  Know your medicines and use a pill organizer to help you take your medicines correctly.  In case of an accidental overdose, call your local [place] at [phone] or visit your local emergency department immediately. This is important. This information is not intended to replace advice given to you by your health care provider. Make sure you discuss any questions you have with your health care provider. Document Revised: 03/18/2017 Document Reviewed: 03/18/2017 Elsevier Patient Education  2021 [place] Prevention in the Home, Adult Falls can cause injuries  and can happen to people of all ages. There are many things you can do to make your home safe and to help prevent falls. Ask for help when making these changes. What actions can I take to prevent falls? General Instructions  Use good lighting in all rooms. Replace any light bulbs that burn out.  Turn on the lights in dark areas. Use night-lights.  Keep items that you use often in easy-to-reach places. Lower the shelves around your home if needed.  Set up your furniture so you have a clear path. Avoid moving your furniture around.  Do not have throw rugs or other things on the floor that can make you trip.  Avoid walking on wet floors.  If any of your floors are uneven, fix them.  Add color or contrast paint or tape to clearly mark and help you see: ? Grab bars or handrails. ? First and last steps of staircases. ? Where the edge of each step is.  If you use a stepladder: ? Make sure that it is fully opened. Do not climb a closed stepladder. ? Make sure the sides of the stepladder are locked in place. ? Ask someone to hold the stepladder while you use it.  Know where your pets are when moving through your home. What can I do in the  bathroom?  Keep the floor dry. Clean up any water on the floor right away.  Remove soap buildup in the tub or shower.  Use nonskid mats or decals on the floor of the tub or shower.  Attach bath mats securely with double-sided, nonslip rug tape.  If you need to sit down in the shower, use a plastic, nonslip stool.  Install grab bars by the toilet and in the tub and shower. Do not use towel bars as grab bars.      What can I do in the bedroom?  Make sure that you have a light by your bed that is easy to reach.  Do not use any sheets or blankets for your bed that hang to the floor.  Have a firm chair with side arms that you can use for support when you get dressed. What can I do in the kitchen?  Clean up any spills right away.  If you need to reach something above you, use a step stool with a grab bar.  Keep electrical cords out of the way.  Do not use floor polish or wax that makes floors slippery. What can I do with my stairs?  Do not leave any items on the stairs.  Make sure that you have a light switch at the top and the bottom of the stairs.  Make sure that there are handrails on both sides of the stairs. Fix handrails that are broken or loose.  Install nonslip stair treads on all your stairs.  Avoid having throw rugs at the top or bottom of the stairs.  Choose a carpet that does not hide the edge of the steps on the stairs.  Check carpeting to make sure that it is firmly attached to the stairs. Fix carpet that is loose or worn. What can I do on the outside of my home?  Use bright outdoor lighting.  Fix the edges of walkways and driveways and fix any cracks.  Remove anything that might make you trip as you walk through a door, such as a raised step or threshold.  Trim any bushes or trees on paths to your home.  Check  to see if handrails are loose or broken and that both sides of all steps have handrails.  Install guardrails along the edges of any raised  decks and porches.  Clear paths of anything that can make you trip, such as tools or rocks.  Have leaves, snow, or ice cleared regularly.  Use sand or salt on paths during winter.  Clean up any spills in your garage right away. This includes grease or oil spills. What other actions can I take?  Wear shoes that: ? Have a low heel. Do not wear high heels. ? Have rubber bottoms. ? Feel good on your feet and fit well. ? Are closed at the toe. Do not wear open-toe sandals.  Use tools that help you move around if needed. These include: ? Canes. ? Walkers. ? Scooters. ? Crutches.  Review your medicines with your doctor. Some medicines can make you feel dizzy. This can increase your chance of falling. Ask your doctor what else you can do to help prevent falls. Where to find more information  Centers for Disease Control and Prevention, [employer]: http://www.wolf.info/  National Institute on Aging: http://kim-miller.com/ Contact a doctor if:  You are afraid of falling at home.  You feel weak, drowsy, or dizzy at home.  You fall at home. Summary  There are many simple things that you can do to make your home safe and to help prevent falls.  Ways to make your home safe include removing things that can make you trip and installing grab bars in the bathroom.  Ask for help when making these changes in your home. This information is not intended to replace advice given to you by your health care provider. Make sure you discuss any questions you have with your health care provider. Document Revised: 10/25/2019 Document Reviewed: 10/25/2019 Elsevier Patient Education  2021 [place], Adult  A concussion is a brain injury from a hard, direct hit (trauma) to your head or body. This direct hit causes your brain to quickly shake back and forth inside your skull. A concussion may also be called a mild traumatic brain injury (TBI). Healing from this injury can take time. What are the  causes? This condition is caused by:  A direct hit to your head, such as: ? Running into a player during a game. ? Being hit in a fight. ? Hitting your head on a hard surface.  A quick and sudden movement of the head or neck, such as in a car crash. What are the signs or symptoms? The signs of a concussion can be hard to notice. They may be missed by you, family members, and doctors. You may look fine on the outside but may not act or feel normal. Physical symptoms  Headaches.  Being dizzy.  Problems with body balance.  Being sensitive to light or noise.  Vomiting or feeling like you may vomit.  Being tired.  Problems seeing or hearing.  Not sleeping or eating as you used to.  Seizure. Mental and emotional symptoms  Feeling grouchy (irritable).  Having mood changes.  Problems remembering things.  Trouble focusing your mind (concentrating), organizing, or making decisions.  Being slow to think, act, react, speak, or read.  Feeling worried or nervous (anxious).  Feeling sad (depressed). How is this treated? This condition may be treated by:  Stopping sports or activity if you are injured. If you hit your head or have signs of concussion: ? Do not return to sports or activities the  same day. ? Get checked by a doctor before you return to your activities.  Resting your body and your mind.  Being watched carefully, often at home.  Medicines to help with symptoms such as: ? Headaches. ? Feeling like you may vomit. ? Problems with sleep.  Avoiding alcohol and drugs.  Being asked to go to a concussion clinic or a place to help you recover (rehabilitation center). Recovery from a concussion can take time. Return to activities only:  When you are fully healed.  When your doctor says it is safe. Avoid taking strong pain medicines (opioids) for a concussion. Follow these instructions at home: Activity  Limit activities that need a lot of thought or focus,  such as: ? Homework or work for your job. ? Watching TV. ? Using the computer or phone. ? Playing memory games and puzzles.  Rest. Rest helps your brain heal. Make sure you: ? Get plenty of sleep. Most adults should get 7-9 hours of sleep each night. ? Rest during the day. Take naps or breaks when you feel tired.  Avoid activity like exercise until your doctor says its safe. Stop any activity that makes symptoms worse.  Do not do activities that could cause a second concussion, such as riding a bike or playing sports.  Ask your doctor when you can return to your normal activities, such as school, work, sports, and driving. Your ability to react may be slower. Do not do these activities if you are dizzy. General instructions  Take over-the-counter and prescription medicines only as told by your doctor.  Do not drink alcohol until your doctor says you can.  Watch your symptoms and tell other people to do the same. Other problems can occur after a concussion. Older adults have a higher risk of serious problems.  Tell your work Freight forwarder, teachers, Government social research officer, school counselor, coach, or Product/process development scientist about your injury and symptoms. Tell them about what you can or cannot do.  Keep all follow-up visits as told by your doctor. This is important.   How is this prevented? It is very important that you do not get another brain injury. In rare cases, another injury can cause brain damage that will not go away, brain swelling, or death. The risk of this is greatest in the first 7-10 days after a head injury. To avoid injuries:  Stop activities that could lead to a second concussion, such as contact sports, until your doctor says it is okay.  When you return to sports or activities: ? Do not crash into other players. This is how most concussions happen. ? Follow the rules. ? Respect other players. Do not engage in violent behavior while playing.  Get regular exercise. Do strength and  balance training.  Wear a helmet that fits you well during sports, biking, or other activities.  Helmets can help protect you from serious skull and brain injuries, but they do not protect you from a concussion. Even when wearing a helmet, you should avoid being hit in the head. Contact a doctor if:  Your symptoms do not get better.  You have new symptoms.  You have another injury. Get help right away if:  You have bad headaches or your headaches get worse.  You feel weak or numb in any part of your body.  You feel mixed up (confused).  Your balance gets worse.  You vomit often.  You feel more sleepy than normal.  You cannot speak well, or have slurred speech.  You have a seizure.  Others have trouble waking you up.  You have changes in how you act.  You have changes in how you see (vision).  You pass out (lose consciousness). These symptoms may be an emergency. Do not wait to see if the symptoms will go away. Get medical help right away. Call your local emergency services (911 in the [place] Do not drive yourself to the hospital. Summary  A concussion is a brain injury from a hard, direct hit (trauma) to your head or body.  This condition is treated with rest and careful watching of symptoms.  Ask your doctor when you can return to your normal activities, such as school, work, or driving.  Get help right away if you have a very bad headache, feel weak in any part of your body, have a seizure, have changes in how you act or see, or if you are mixed up or more sleepy than normal. This information is not intended to replace advice given to you by your health care provider. Make sure you discuss any questions you have with your health care provider. Document Revised: 02/02/2019 Document Reviewed: 02/02/2019 Elsevier Patient Education  [place].

## 2020-05-16 NOTE — Progress Notes (Signed)
Subjective:  Patient ID: Donna Little, female    DOB: [birth year]  Age: 75 y.o. MRN: [id number]  Chief Complaint  Patient presents with  . Confusion    HPI Donna Little is a 75 year old Caucasian female that presents with her two adult children for evaluation of confusion. She is accompanied by two adult children who assist with medical historyJoyce's children state she has been asking for her deceased mother and having difficulty forming sentences.  Pt had a far from bar stool on 05/13/20.She is taking Plavix 75 mg. She was taken to the [facility] ED by family member on 05/14/20 for evaluation. CT of head negative for intracranial hemorrhage.CT of maxillofacial reveal no fracture or mandibular dislocation. CT of cervical spine revealed C5-C6 DDD with facet arthropathy and foraminal stenosis. CT abd and pelvis revealed minimally displaced subacute  fractures right transverse processes of L1-L4  Fracture at T12. Kyphoplasty of L4-L5  Aortic atherosclerosis and emphysema noted per CT. Current Outpatient Medications on File Prior to Visit  Medication Sig Dispense Refill  . acetaminophen (TYLENOL) 325 MG tablet Take by mouth.    Marland Kitchen albuterol (VENTOLIN HFA) 108 (90 Base) MCG/ACT inhaler Inhale 2 puffs into the lungs every 6 (six) hours as needed.     . citalopram (CELEXA) 20 MG tablet Take 1 tablet (20 mg total) by mouth daily. 90 tablet 0  . clopidogrel (PLAVIX) 75 MG tablet Take 75 mg by mouth daily.    Marland Kitchen denosumab (PROLIA) 60 MG/ML SOSY injection Inject 60 mg into the skin every 6 (six) months.    . diclofenac Sodium (VOLTAREN) 1 % GEL Apply 2 g topically 4 (four) times daily as needed.     . dicyclomine (BENTYL) 10 MG capsule TAKE 1 CAPSULE BY MOUTH 4 TIMES DAILY BEFORE MEAL(S) AND AT BEDTIME 90 capsule 0  . Fluticasone-Umeclidin-Vilant (TRELEGY ELLIPTA) 100-62.5-25 MCG/INH AEPB INHALE 1 PUFF ONCE DAILY USE AT THE SAME TIME EACH DAY 1 each 1  . furosemide (LASIX) 40 MG tablet Take 2 tablets by mouth  twice daily 360 tablet 0  . gabapentin (NEURONTIN) 800 MG tablet TAKE 1 TABLET BY MOUTH THREE TIMES DAILY 270 tablet 1  . meclizine (ANTIVERT) 25 MG tablet Take 25 mg by mouth 3 (three) times daily as needed.     . metoprolol succinate (TOPROL-XL) 25 MG 24 hr tablet Take 25 mg by mouth in the morning and at bedtime.     Marland Kitchen morphine (MS CONTIN) 30 MG 12 hr tablet Take 1 tablet (30 mg total) by mouth in the morning, at noon, and at bedtime. 90 tablet 0  . nitroGLYCERIN (NITROSTAT) 0.4 MG SL tablet Place under the tongue.    . ondansetron (ZOFRAN-ODT) 4 MG disintegrating tablet DISSOLVE 1 TO 2 TABLETS IN MOUTH EVERY 8 HOURS AS NEEDED FOR NAUSEA 40 tablet 0  . oxyCODONE-acetaminophen (PERCOCET) 10-325 MG tablet     . pantoprazole (PROTONIX) 40 MG tablet Take 1 tablet by mouth once daily 90 tablet 1  . Potassium Chloride ER 20 MEQ TBCR Take 1 tablet by mouth twice daily with food 180 tablet 0  . pravastatin (PRAVACHOL) 20 MG tablet Take 1 tablet (20 mg total) by mouth daily. 90 tablet 0  . predniSONE (DELTASONE) 50 MG tablet Take 1 tablet (50 mg total) by mouth daily with breakfast. 5 tablet 0   No current facility-administered medications on file prior to visit.   Past Medical History:  Diagnosis Date  . A-fib ([place])   . Chronic pain  syndrome   . Chronic systolic heart failure ([place])   . Depression   . Generalized atherosclerosis   . GERD (gastroesophageal reflux disease)   . Hypertension   . IBS (irritable bowel syndrome)   . Melanoma ([place])   . Other specified nontoxic goiter   . Palpitations   . Polymyalgia rheumatica ([place])   . Primary pulmonary hypertension ([place])   . Pulmonary embolism ([place])   . Stroke ([place])    ministroke  . Supraventricular tachycardia ([place])   . Type 2 diabetes mellitus ([place])    Past Surgical History:  Procedure Laterality Date  . ABDOMINAL HYSTERECTOMY    . APPENDECTOMY    . CATARACT EXTRACTION    . CORONARY ANGIOPLASTY WITH STENT PLACEMENT      Family  History  Problem Relation Age of Onset  . Diabetes Other   . Stroke Other   . Heart disease Other    Social History   Socioeconomic History  . Marital status: Single    Spouse name: Not on file  . Number of children: 2  . Years of education: Not on file  . Highest education level: Not on file  Occupational History  . Occupation: retired  Tobacco Use  . Smoking status: Former Smoker    Packs/day: 0.50    Years: 50.00    Pack years: 25.00  . Smokeless tobacco: Never Used  Substance and Sexual Activity  . Alcohol use: No    Alcohol/week: 0.0 standard drinks  . Drug use: No  . Sexual activity: Not on file  Other Topics Concern  . Not on file  Social History Narrative  . Not on file   Social Determinants of Health   Financial Resource Strain: Not on file  Food Insecurity: No Food Insecurity  . Worried About Charity fundraiser in the Last Year: Never true  . Ran Out of Food in the Last Year: Never true  Transportation Needs: Not on file  Physical Activity: Not on file  Stress: Not on file  Social Connections: Not on file    Review of Systems  Constitutional: Negative for fatigue and fever.  HENT: Negative for congestion, ear pain, sinus pressure and sore throat.   Eyes: Negative for pain.  Respiratory: Positive for shortness of breath. Negative for cough, chest tightness and wheezing.   Cardiovascular: Negative for chest pain and palpitations.  Gastrointestinal: Negative for abdominal pain, constipation, diarrhea, nausea and vomiting.  Genitourinary: Negative for dysuria and hematuria.  Musculoskeletal: Negative for arthralgias, back pain, joint swelling and myalgias.  Skin: Negative for rash.  Neurological: Positive for dizziness and headaches. Negative for weakness.  Psychiatric/Behavioral: Negative for dysphoric mood. The patient is nervous/anxious.      Objective:  BP 122/68 (BP Location: Left Arm, Patient Position: Sitting)   Pulse 86   Temp (!) 97.4 F  (36.3 C) (Temporal)   Ht 5\' 3"  (1.6 m)   Wt 116 lb (52.6 kg)   SpO2 96%   BMI 20.55 kg/m   BP/Weight 05/16/2020 04/04/2020 42/68/3419  Systolic BP 622 92 297  Diastolic BP 68 58 60  Wt. (Lbs) 116 127 124  BMI 20.55 22.5 21.97    Physical Exam Vitals reviewed.  HENT:     Head: Normocephalic.     Right Ear: Tympanic membrane, ear canal and external ear normal.     Left Ear: Tympanic membrane, ear canal and external ear normal.     Nose: Nose normal.     Mouth/Throat:  Mouth: Mucous membranes are moist.  Eyes:     Extraocular Movements: Extraocular movements intact.     Pupils: Pupils are equal, round, and reactive to light.  Cardiovascular:     Rate and Rhythm: Normal rate and regular rhythm.     Pulses: Normal pulses.     Heart sounds: Normal heart sounds.  Pulmonary:     Effort: Pulmonary effort is normal.     Breath sounds: Normal breath sounds.  Abdominal:     General: Bowel sounds are normal.     Palpations: Abdomen is soft.  Musculoskeletal:        General: Tenderness (BACK) present.  Skin:    General: Skin is warm and dry.     Capillary Refill: Capillary refill takes less than 2 seconds.     Findings: Bruising (right eye, cheek, temple) present.  Neurological:     Mental Status: She is alert and oriented to person, place, and time. Mental status is at baseline.           Lab Results  Component Value Date   WBC 7.5 12/26/2019   HGB 13.5 12/26/2019   HCT 41.3 12/26/2019   PLT 236 12/26/2019   GLUCOSE 86 01/16/2020   CHOL 157 01/16/2020   TRIG 136 01/16/2020   HDL 35 (L) 01/16/2020   LDLCALC 98 01/16/2020   ALT 8 01/16/2020   AST 20 01/16/2020   NA 139 01/16/2020   K 4.5 01/16/2020   CL 100 01/16/2020   CREATININE 0.77 01/16/2020   BUN 13 01/16/2020   CO2 27 01/16/2020   TSH 1.050 12/26/2019   HGBA1C 5.8 (H) 01/16/2020   MICROALBUR 30 01/20/2020      Assessment & Plan:   1. Fall, subsequent encounter - CT Head Wo Contrast  2.  Polypharmacy - CCM pharmacy monitoring  3. Disorientation - CT Head Wo Contrast  4. Hallucinations - CT Head Wo Contrast  5. Global aphasia - CT Head Wo Contrast  6. Long term (current) use of anticoagulants -Monitor for signs/symptoms of bleeding -Seek emergency medical care for any falls  7. Age-related osteoporosis with current pathological fracture with routine healing, subsequent encounter -Tylenol as needed for pain -Continue Prolia as directed  8. Atherosclerosis of aorta (HCC) -Continue Plavix 75 mg daily   Take Pantoprazole daily for GERD Take Zofran 4mg  30 minutes prior to Morphine pain medication We will call you with appointment with pharmacist for evaluation of medication Stat CT of head today at [facility] at 1:30 today Return in 2 weeks with Dr Tobie Poet    Follow-up: 05/30/20  An After Visit Summary was printed and given to the patient.  Rip Harbour, NP [place] [phone]

## 2020-05-16 NOTE — Telephone Encounter (Signed)
Donna Little called and left VM stating Pt fell Monday and spent Tuesday in ED. She hit head, but found nothing wrong in ED with head. She has "a couple" broke ribs and "broke bones in her back." Daughter is concerned because she is not acting right. She is not eating and not acting right.

## 2020-05-20 ENCOUNTER — Encounter: Payer: Self-pay | Admitting: Nurse Practitioner

## 2020-05-20 ENCOUNTER — Inpatient Hospital Stay: Payer: Medicare Other | Admitting: Family Medicine

## 2020-05-29 ENCOUNTER — Other Ambulatory Visit: Payer: Self-pay

## 2020-05-29 MED ORDER — MORPHINE SULFATE ER 30 MG PO TBCR: 30.0000 mg | EXTENDED_RELEASE_TABLET | Freq: Three times a day (TID) | ORAL | 0 refills | Status: DC

## 2020-05-29 NOTE — Telephone Encounter (Signed)
Pt called requesting refill on morphine. Please advise.   Harrell Lark 05/29/20 7:58 AM

## 2020-05-30 ENCOUNTER — Other Ambulatory Visit: Payer: Self-pay

## 2020-05-30 ENCOUNTER — Encounter: Payer: Self-pay | Admitting: Family Medicine

## 2020-05-30 ENCOUNTER — Ambulatory Visit (INDEPENDENT_AMBULATORY_CARE_PROVIDER_SITE_OTHER): Payer: Medicare Other | Admitting: Family Medicine

## 2020-05-30 VITALS — BP 100/52 | HR 60 | Temp 97.5°F | Resp 18 | Ht 63.0 in | Wt 125.6 lb

## 2020-05-30 DIAGNOSIS — M25532 Pain in left wrist: Secondary | ICD-10-CM

## 2020-05-30 DIAGNOSIS — M545 Low back pain, unspecified: Secondary | ICD-10-CM

## 2020-05-30 DIAGNOSIS — G8929 Other chronic pain: Secondary | ICD-10-CM | POA: Diagnosis not present

## 2020-05-30 DIAGNOSIS — F0781 Postconcussional syndrome: Secondary | ICD-10-CM

## 2020-05-30 NOTE — Progress Notes (Signed)
Subjective:  Patient ID: Donna Little, female    DOB: 01/29/1946  Age: 75 y.o. MRN: [id number]  Chief Complaint  Patient presents with  . Follow-up    HPI Had head contusion with a fall om 05/14/2020: headaches, dizziness, nausea, and hallucinations are gone. Work up was negative for acute bleeds or fractures Back is stable. Dr. Prince Rome has encouraged her. Has improved since her second kyphoplasty.   Current Outpatient Medications on File Prior to Visit  Medication Sig Dispense Refill  . pravastatin (PRAVACHOL) 40 MG tablet Take 1 tablet by mouth daily.    Marland Kitchen acetaminophen (TYLENOL) 325 MG tablet Take by mouth.    Marland Kitchen albuterol (VENTOLIN HFA) 108 (90 Base) MCG/ACT inhaler Inhale 2 puffs into the lungs every 6 (six) hours as needed.     . citalopram (CELEXA) 20 MG tablet Take 1 tablet (20 mg total) by mouth daily. 90 tablet 0  . clopidogrel (PLAVIX) 75 MG tablet Take 75 mg by mouth daily.    Marland Kitchen denosumab (PROLIA) 60 MG/ML SOSY injection Inject 60 mg into the skin every 6 (six) months.    . diclofenac Sodium (VOLTAREN) 1 % GEL Apply 2 g topically 4 (four) times daily as needed.     . dicyclomine (BENTYL) 10 MG capsule TAKE 1 CAPSULE BY MOUTH 4 TIMES DAILY BEFORE MEAL(S) AND AT BEDTIME 90 capsule 0  . Fluticasone-Umeclidin-Vilant (TRELEGY ELLIPTA) 100-62.5-25 MCG/INH AEPB INHALE 1 PUFF ONCE DAILY USE AT THE SAME TIME EACH DAY 1 each 1  . furosemide (LASIX) 40 MG tablet Take 2 tablets by mouth twice daily 360 tablet 0  . gabapentin (NEURONTIN) 800 MG tablet TAKE 1 TABLET BY MOUTH THREE TIMES DAILY 270 tablet 1  . meclizine (ANTIVERT) 25 MG tablet Take 25 mg by mouth 3 (three) times daily as needed.     . metoprolol succinate (TOPROL-XL) 25 MG 24 hr tablet Take 25 mg by mouth in the morning and at bedtime.     Marland Kitchen morphine (MS CONTIN) 30 MG 12 hr tablet Take 1 tablet (30 mg total) by mouth in the morning, at noon, and at bedtime. 90 tablet 0  . nitroGLYCERIN (NITROSTAT) 0.4 MG SL tablet Place under  the tongue.    . ondansetron (ZOFRAN-ODT) 4 MG disintegrating tablet DISSOLVE 1 TO 2 TABLETS IN MOUTH EVERY 8 HOURS AS NEEDED FOR NAUSEA 40 tablet 0  . oxyCODONE-acetaminophen (PERCOCET) 10-325 MG tablet     . pantoprazole (PROTONIX) 40 MG tablet Take 1 tablet by mouth once daily 90 tablet 1  . Potassium Chloride ER 20 MEQ TBCR Take 1 tablet by mouth twice daily with food 180 tablet 0  . predniSONE (DELTASONE) 50 MG tablet Take 1 tablet (50 mg total) by mouth daily with breakfast. 5 tablet 0   No current facility-administered medications on file prior to visit.   Past Medical History:  Diagnosis Date  . A-fib ([place])   . Atherosclerosis of both carotid arteries 2022   noted per CT on 05/14/20  . Chronic pain syndrome   . Chronic systolic heart failure ([place])   . Depression   . Emphysema of lung ([place]) 2022   noted per CT scan on 05/14/20  . Generalized atherosclerosis   . GERD (gastroesophageal reflux disease)   . Hypertension   . IBS (irritable bowel syndrome)   . Melanoma ([place])   . Other specified nontoxic goiter   . Palpitations   . Polymyalgia rheumatica ([place])   . Primary pulmonary hypertension ([place])   .  Pulmonary embolism ([place])   . Stroke ([place])    ministroke  . Supraventricular tachycardia ([place])   . Type 2 diabetes mellitus ([place])    Past Surgical History:  Procedure Laterality Date  . ABDOMINAL HYSTERECTOMY    . APPENDECTOMY    . CATARACT EXTRACTION    . CORONARY ANGIOPLASTY WITH STENT PLACEMENT    . KYPHOPLASTY  05/2020   L4-L5    Family History  Problem Relation Age of Onset  . Diabetes Other   . Stroke Other   . Heart disease Other    Social History   Socioeconomic History  . Marital status: Single    Spouse name: Not on file  . Number of children: 2  . Years of education: Not on file  . Highest education level: Not on file  Occupational History  . Occupation: retired  Tobacco Use  . Smoking status: Former Smoker    Packs/day: 0.50    Years: 50.00     Pack years: 25.00  . Smokeless tobacco: Never Used  Substance and Sexual Activity  . Alcohol use: No    Alcohol/week: 0.0 standard drinks  . Drug use: No  . Sexual activity: Not on file  Other Topics Concern  . Not on file  Social History Narrative  . Not on file   Social Determinants of Health   Financial Resource Strain: Not on file  Food Insecurity: No Food Insecurity  . Worried About Charity fundraiser in the Last Year: Never true  . Ran Out of Food in the Last Year: Never true  Transportation Needs: Not on file  Physical Activity: Not on file  Stress: Not on file  Social Connections: Not on file    Review of Systems  Constitutional: Positive for fatigue. Negative for chills and fever.  HENT: Negative for congestion, rhinorrhea and sore throat.   Respiratory: Positive for cough and shortness of breath.   Cardiovascular: Negative for chest pain and palpitations.  Gastrointestinal: Positive for diarrhea (lasted 4 days after hospital discharge but then resolved). Negative for abdominal pain, constipation, nausea and vomiting.  Genitourinary: Negative for dysuria and urgency.  Musculoskeletal: Positive for arthralgias (left wrist tender since fall. having some issues lifting items.), back pain and myalgias.  Neurological: Positive for dizziness and headaches. Negative for weakness and light-headedness.  Psychiatric/Behavioral: Positive for dysphoric mood. The patient is not nervous/anxious.      Objective:  BP (!) 100/52   Pulse 60   Temp (!) 97.5 F (36.4 C)   Resp 18   Ht 5\' 3"  (1.6 m)   Wt 125 lb 9.6 oz (57 kg)   BMI 22.25 kg/m   BP/Weight 05/30/2020 05/16/2020 51/05/5850  Systolic BP 778 242 92  Diastolic BP 52 68 58  Wt. (Lbs) 125.6 116 127  BMI 22.25 20.55 22.5    Physical Exam Vitals reviewed.  Constitutional:      Appearance: Normal appearance.  Cardiovascular:     Rate and Rhythm: Normal rate and regular rhythm.     Heart sounds: Normal heart  sounds.  Pulmonary:     Effort: Pulmonary effort is normal.     Breath sounds: Normal breath sounds.  Musculoskeletal:     Comments: Left wrist nontender. Full ROM. Back: flexed forward.  Skin:    Findings: Bruising present.     Comments: Diffuse brusises.  Neurological:     Mental Status: She is alert.  Psychiatric:        Mood and Affect: Mood  normal.        Behavior: Behavior normal.     Diabetic Foot Exam - Simple   No data filed      Lab Results  Component Value Date   WBC 7.5 12/26/2019   HGB 13.5 12/26/2019   HCT 41.3 12/26/2019   PLT 236 12/26/2019   GLUCOSE 86 01/16/2020   CHOL 157 01/16/2020   TRIG 136 01/16/2020   HDL 35 (L) 01/16/2020   LDLCALC 98 01/16/2020   ALT 8 01/16/2020   AST 20 01/16/2020   NA 139 01/16/2020   K 4.5 01/16/2020   CL 100 01/16/2020   CREATININE 0.77 01/16/2020   BUN 13 01/16/2020   CO2 27 01/16/2020   TSH 1.050 12/26/2019   HGBA1C 5.8 (H) 01/16/2020   MICROALBUR 30 01/20/2020      Assessment & Plan:   1. Postconcussion syndrome Resolved  2. Chronic bilateral low back pain without sciatica  The current medical regimen is effective;  continue present plan and medications.  3. Left wrist pain. Pt to monitor.   Follow-up: Return in about 3 months (around 09/03/2020).  An After Visit Summary was printed and given to the patient.  [name], MD [facility] [phone]

## 2020-06-03 ENCOUNTER — Other Ambulatory Visit: Payer: Self-pay | Admitting: Physician Assistant

## 2020-06-24 ENCOUNTER — Other Ambulatory Visit: Payer: Self-pay | Admitting: Family Medicine

## 2020-06-27 ENCOUNTER — Encounter: Payer: Self-pay | Admitting: Legal Medicine

## 2020-06-27 ENCOUNTER — Other Ambulatory Visit: Payer: Self-pay

## 2020-06-27 ENCOUNTER — Telehealth: Payer: Self-pay

## 2020-06-27 ENCOUNTER — Ambulatory Visit (INDEPENDENT_AMBULATORY_CARE_PROVIDER_SITE_OTHER): Payer: Medicare Other | Admitting: Legal Medicine

## 2020-06-27 VITALS — BP 110/70 | HR 65 | Temp 97.2°F | Resp 18 | Ht 63.0 in | Wt 124.4 lb

## 2020-06-27 DIAGNOSIS — F330 Major depressive disorder, recurrent, mild: Secondary | ICD-10-CM | POA: Diagnosis not present

## 2020-06-27 DIAGNOSIS — R32 Unspecified urinary incontinence: Secondary | ICD-10-CM | POA: Diagnosis not present

## 2020-06-27 DIAGNOSIS — I2699 Other pulmonary embolism without acute cor pulmonale: Secondary | ICD-10-CM | POA: Diagnosis not present

## 2020-06-27 DIAGNOSIS — R7303 Prediabetes: Secondary | ICD-10-CM | POA: Insufficient documentation

## 2020-06-27 DIAGNOSIS — R410 Disorientation, unspecified: Secondary | ICD-10-CM

## 2020-06-27 DIAGNOSIS — I110 Hypertensive heart disease with heart failure: Secondary | ICD-10-CM | POA: Diagnosis not present

## 2020-06-27 DIAGNOSIS — E756 Lipid storage disorder, unspecified: Secondary | ICD-10-CM

## 2020-06-27 DIAGNOSIS — R300 Dysuria: Secondary | ICD-10-CM | POA: Diagnosis not present

## 2020-06-27 DIAGNOSIS — E782 Mixed hyperlipidemia: Secondary | ICD-10-CM | POA: Diagnosis not present

## 2020-06-27 DIAGNOSIS — E1169 Type 2 diabetes mellitus with other specified complication: Secondary | ICD-10-CM | POA: Insufficient documentation

## 2020-06-27 MED ORDER — NITROFURANTOIN MONOHYD MACRO 100 MG PO CAPS
100.0000 mg | ORAL_CAPSULE | Freq: Two times a day (BID) | ORAL | 0 refills | Status: DC
Start: 2020-06-27 — End: 2020-07-29

## 2020-06-27 NOTE — Progress Notes (Signed)
Subjective:  Patient ID: Donna Little, female    DOB: Dec 01, 1945  Age: 75 y.o. MRN: [id number]  Chief Complaint  Patient presents with  . Balance problems    Patient states she will start to stumble when she walks, does not feel dizzy. Symptoms started Saturday.     HPI: feels confused for 2 days.  No true vertigo.  She is on MS contin 90g a day.  Generalized weakness.  No dysuria.  Feels dyspnea with walking.  o2 sat 93% now, no chest pain.  No edema. Trouble breathing at night.  This is how she felt when she had last pulmonary embolus. She became confused that people were there at home that was not there.  Head feel strange. Patient symptoms are vague and change when asked a second time.  She says this does not feel like pulmonary embolus which came on suddenly she is not coughing or wheezing we are unable to get a urine specimen although UTI is a common cause of mild confusion.  She is on her chronic medicines with no changes.  She has had no further falls.  EKG showed minor changes only and I will check her blood work since has not been checked since 6 months ago. Current Outpatient Medications on File Prior to Visit  Medication Sig Dispense Refill  . acetaminophen (TYLENOL) 325 MG tablet Take by mouth.    Marland Kitchen albuterol (VENTOLIN HFA) 108 (90 Base) MCG/ACT inhaler Inhale 2 puffs into the lungs every 6 (six) hours as needed.     . citalopram (CELEXA) 20 MG tablet Take 1 tablet by mouth once daily 90 tablet 0  . clopidogrel (PLAVIX) 75 MG tablet Take 75 mg by mouth daily.    Marland Kitchen denosumab (PROLIA) 60 MG/ML SOSY injection Inject 60 mg into the skin every 6 (six) months.    . diclofenac Sodium (VOLTAREN) 1 % GEL Apply 2 g topically 4 (four) times daily as needed.     . dicyclomine (BENTYL) 10 MG capsule TAKE 1 CAPSULE BY MOUTH 4 TIMES DAILY BEFORE MEAL(S) AND AT BEDTIME 90 capsule 0  . Fluticasone-Umeclidin-Vilant (TRELEGY ELLIPTA) 100-62.5-25 MCG/INH AEPB INHALE 1 PUFF ONCE DAILY USE AT THE SAME  TIME EACH DAY 1 each 1  . furosemide (LASIX) 40 MG tablet Take 2 tablets by mouth twice daily 360 tablet 0  . gabapentin (NEURONTIN) 800 MG tablet TAKE 1 TABLET BY MOUTH THREE TIMES DAILY 270 tablet 1  . meclizine (ANTIVERT) 25 MG tablet Take 25 mg by mouth 3 (three) times daily as needed.     . metoprolol succinate (TOPROL-XL) 25 MG 24 hr tablet Take 25 mg by mouth in the morning and at bedtime.     Marland Kitchen morphine (MS CONTIN) 30 MG 12 hr tablet Take 1 tablet (30 mg total) by mouth in the morning, at noon, and at bedtime. 90 tablet 0  . nitroGLYCERIN (NITROSTAT) 0.4 MG SL tablet Place under the tongue.    . ondansetron (ZOFRAN-ODT) 4 MG disintegrating tablet DISSOLVE 1 TO 2 TABLETS IN MOUTH EVERY 8 HOURS AS NEEDED FOR NAUSEA 40 tablet 0  . oxyCODONE-acetaminophen (PERCOCET) 10-325 MG tablet     . pantoprazole (PROTONIX) 40 MG tablet Take 1 tablet by mouth once daily 90 tablet 1  . Potassium Chloride ER 20 MEQ TBCR Take 1 tablet by mouth twice daily with food 180 tablet 0  . pravastatin (PRAVACHOL) 40 MG tablet Take 1 tablet by mouth daily.     No current  facility-administered medications on file prior to visit.   Past Medical History:  Diagnosis Date  . A-fib ([place])   . Atherosclerosis of both carotid arteries 2022   noted per CT on 05/14/20  . Chronic pain syndrome   . Chronic systolic heart failure ([place])   . Depression   . Emphysema of lung ([place]) 2022   noted per CT scan on 05/14/20  . Generalized atherosclerosis   . GERD (gastroesophageal reflux disease)   . Hypertension   . IBS (irritable bowel syndrome)   . Melanoma ([place])   . Other specified nontoxic goiter   . Palpitations   . Polymyalgia rheumatica ([place])   . Primary pulmonary hypertension ([place])   . Pulmonary embolism ([place])   . Stroke ([place])    ministroke  . Supraventricular tachycardia ([place])   . Type 2 diabetes mellitus ([place])    Past Surgical History:  Procedure Laterality Date  . ABDOMINAL HYSTERECTOMY    . APPENDECTOMY     . CATARACT EXTRACTION    . CORONARY ANGIOPLASTY WITH STENT PLACEMENT    . KYPHOPLASTY  05/2020   L4-L5    Family History  Problem Relation Age of Onset  . Diabetes Other   . Stroke Other   . Heart disease Other    Social History   Socioeconomic History  . Marital status: Single    Spouse name: Not on file  . Number of children: 2  . Years of education: Not on file  . Highest education level: Not on file  Occupational History  . Occupation: retired  Tobacco Use  . Smoking status: Light Tobacco Smoker    Years: 50.00  . Smokeless tobacco: Never Used  Substance and Sexual Activity  . Alcohol use: No    Alcohol/week: 0.0 standard drinks  . Drug use: No  . Sexual activity: Not on file  Other Topics Concern  . Not on file  Social History Narrative  . Not on file   Social Determinants of Health   Financial Resource Strain: Not on file  Food Insecurity: No Food Insecurity  . Worried About Charity fundraiser in the Last Year: Never true  . Ran Out of Food in the Last Year: Never true  Transportation Needs: Not on file  Physical Activity: Not on file  Stress: Not on file  Social Connections: Not on file    Review of Systems  Constitutional: Negative for activity change and appetite change.  HENT: Negative.   Eyes: Negative for visual disturbance.  Respiratory: Positive for shortness of breath.   Cardiovascular: Negative for chest pain, palpitations and leg swelling.  Gastrointestinal: Negative for abdominal distention and abdominal pain.  Genitourinary: Negative for difficulty urinating and urgency.  Musculoskeletal: Positive for arthralgias and back pain.  Skin: Negative.   Neurological: Positive for weakness.  Psychiatric/Behavioral: Negative.      Objective:  BP 110/70 (BP Location: Right Arm, Patient Position: Sitting, Cuff Size: Normal)   Pulse 65   Temp (!) 97.2 F (36.2 C) (Temporal)   Resp 18   Ht 5\' 3"  (1.6 m)   Wt 124 lb 6.4 oz (56.4 kg)   SpO2  93%   BMI 22.04 kg/m   BP/Weight 06/27/2020 05/30/2020 2/42/3536  Systolic BP 144 315 400  Diastolic BP 70 52 68  Wt. (Lbs) 124.4 125.6 116  BMI 22.04 22.25 20.55    Physical Exam Vitals reviewed.  Constitutional:      Appearance: Normal appearance. She is ill-appearing.  HENT:  Right Ear: Tympanic membrane normal.     Left Ear: Tympanic membrane normal.     Nose: Nose normal.     Mouth/Throat:     Mouth: Mucous membranes are moist.  Eyes:     Extraocular Movements: Extraocular movements intact.     Conjunctiva/sclera: Conjunctivae normal.     Pupils: Pupils are equal, round, and reactive to light.  Cardiovascular:     Rate and Rhythm: Normal rate and regular rhythm.     Pulses: Normal pulses.     Heart sounds: Normal heart sounds. No murmur heard. No gallop.   Pulmonary:     Effort: No respiratory distress.     Breath sounds: Normal breath sounds. No rales.     Comments: Mild dyspnea Abdominal:     General: Abdomen is flat. Bowel sounds are normal.     Palpations: Abdomen is soft.  Musculoskeletal:        General: Tenderness (chroic back pain) present.     Cervical back: Normal range of motion.     Right lower leg: No edema.     Left lower leg: No edema.  Skin:    General: Skin is warm.     Capillary Refill: Capillary refill takes less than 2 seconds.  Neurological:     General: No focal deficit present.     Mental Status: She is alert and oriented to person, place, and time. Mental status is at baseline.   EKG: sinus brady rate 55, low voltage, PR 154 msec, QRS 24msec, QTc 432 msec, no ST-T wave changes but low voltage.    Lab Results  Component Value Date   WBC 7.5 12/26/2019   HGB 13.5 12/26/2019   HCT 41.3 12/26/2019   PLT 236 12/26/2019   GLUCOSE 86 01/16/2020   CHOL 157 01/16/2020   TRIG 136 01/16/2020   HDL 35 (L) 01/16/2020   LDLCALC 98 01/16/2020   ALT 8 01/16/2020   AST 20 01/16/2020   NA 139 01/16/2020   K 4.5 01/16/2020   CL 100  01/16/2020   CREATININE 0.77 01/16/2020   BUN 13 01/16/2020   CO2 27 01/16/2020   TSH 1.050 12/26/2019   HGBA1C 5.8 (H) 01/16/2020   MICROALBUR 30 01/20/2020      Assessment & Plan:   Diagnoses and all orders for this visit: Confusion Patient has had confusion for the last 2 at least 48 hours in his head some visual hallucinations at home.  She has no true dizziness no chest pain denies dysuria but has had urine infections in the past we will get lab work since none has been done for about 6 months.   Mild recurrent major depression ([place]) Patient has had some minor depression but does not feel depressed at all at the present time He has  Combined hyperlipidemia associated with type 2 diabetes mellitus ([place]) -     Lipid panel Patient has had a history of diabetes mellitus but is on no medication for this  Acute pulmonary embolism, unspecified pulmonary  embolism type, unspecified whether acute cor pulmonale present [facility]) Patient has a history of acute pulmonary embolism but is off all anticoagulants and is not feeling any chest pain or dyspnea.  Hypertensive heart disease with heart failure (HCC) -     EKG 12-Lead -     CBC with Differential/Platelet -     Comprehensive metabolic panel An individual hypertension care plan was established and reinforced today.  The patient's status was assessed using clinical  findings on exam and labs or diagnostic tests. The patient's success at meeting treatment goals on disease specific evidence-based guidelines and found to be fair controlled. SELF MANAGEMENT: The patient and I together assessed ways to personally work towards obtaining the recommended goals. RECOMMENDATIONS: avoid decongestants found in common cold remedies, decrease consumption of alcohol, perform routine monitoring of BP with home BP cuff, exercise, reduction of dietary salt, take medicines as prescribed, try not to miss doses and quit smoking.  Regular exercise and  maintaining a healthy weight is needed.  Stress reduction may help. A CLINICAL SUMMARY including written plan identify barriers to care unique to individual due to social or financial issues.  We attempt to mutually creat solutions for individual and family understanding. Incontinence of urine in female  -     POCT URINALYSIS DIP ([facility]) Patient has had incontinence of urine we will check a urinalysis strongly suspect that she has some urinary infection.  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  Dysuria -     nitrofurantoin, macrocrystal-monohydrate, (MACROBID) 100 MG capsule; Take 1 capsule (100 mg total) by mouth 2 (two) times daily. Patient had some dysuria and discharge but was unable to get a urine at present time she will bring 1 back in the present time I will treat her as a possible UTI which is the most common cause of confusion at this age group.  Mixed hyperlipidemia AN INDIVIDUAL CARE PLAN for hyperlipidemia/ cholesterol was established and reinforced today.  The patient's status was assessed using clinical findings on exam, lab and other diagnostic tests. The patient's disease status was assessed based on evidence-based guidelines and found to be fair controlled. MEDICATIONS were reviewed. SELF MANAGEMENT GOALS have been discussed and patient's success at attaining the goal of low cholesterol was assessed. RECOMMENDATION given include regular exercise 3 days a week and low cholesterol/low fat diet. CLINICAL SUMMARY including written plan to identify barriers unique to the patient due to social or economic  reasons was discussed.        I spent 35 minutes dedicated to the care of this patient on the date of this encounter to include face-to-face time with the patient, as well as: review old records  Follow-up: Return in about 1 week (around 07/04/2020) for follow up  confusion.  An After Visit Summary was printed and given to the patient.  Reinaldo Meeker, MD [facility] [phone]

## 2020-06-27 NOTE — Progress Notes (Signed)
    Chronic Care Management Pharmacy Assistant   Name: KERIANA SARSFIELD  MRN: [id number] DOB: 07-17-1945   Reason for Encounter: Disease State for COPD   Recent office visits:  05/30/20- PCP - Dr. Tobie Poet- Pravastatin changed from 20mg  to 40mg  daily   Recent consult visits:  05/08/20-Kyphoplasty   Hospital visits:  05/08/20- Kyphoplasty   Medications: Outpatient Encounter Medications as of 06/27/2020  Medication Sig  . acetaminophen (TYLENOL) 325 MG tablet Take by mouth.  Marland Kitchen albuterol (VENTOLIN HFA) 108 (90 Base) MCG/ACT inhaler Inhale 2 puffs into the lungs every 6 (six) hours as needed.   . citalopram (CELEXA) 20 MG tablet Take 1 tablet by mouth once daily  . clopidogrel (PLAVIX) 75 MG tablet Take 75 mg by mouth daily.  Marland Kitchen denosumab (PROLIA) 60 MG/ML SOSY injection Inject 60 mg into the skin every 6 (six) months.  . diclofenac Sodium (VOLTAREN) 1 % GEL Apply 2 g topically 4 (four) times daily as needed.   . dicyclomine (BENTYL) 10 MG capsule TAKE 1 CAPSULE BY MOUTH 4 TIMES DAILY BEFORE MEAL(S) AND AT BEDTIME  . Fluticasone-Umeclidin-Vilant (TRELEGY ELLIPTA) 100-62.5-25 MCG/INH AEPB INHALE 1 PUFF ONCE DAILY USE AT THE SAME TIME EACH DAY  . furosemide (LASIX) 40 MG tablet Take 2 tablets by mouth twice daily  . gabapentin (NEURONTIN) 800 MG tablet TAKE 1 TABLET BY MOUTH THREE TIMES DAILY  . meclizine (ANTIVERT) 25 MG tablet Take 25 mg by mouth 3 (three) times daily as needed.   . metoprolol succinate (TOPROL-XL) 25 MG 24 hr tablet Take 25 mg by mouth in the morning and at bedtime.   Marland Kitchen morphine (MS CONTIN) 30 MG 12 hr tablet Take 1 tablet (30 mg total) by mouth in the morning, at noon, and at bedtime.  . nitroGLYCERIN (NITROSTAT) 0.4 MG SL tablet Place under the tongue.  . ondansetron (ZOFRAN-ODT) 4 MG disintegrating tablet DISSOLVE 1 TO 2 TABLETS IN MOUTH EVERY 8 HOURS AS NEEDED FOR NAUSEA  . oxyCODONE-acetaminophen (PERCOCET) 10-325 MG tablet   . pantoprazole (PROTONIX) 40 MG tablet Take 1  tablet by mouth once daily  . Potassium Chloride ER 20 MEQ TBCR Take 1 tablet by mouth twice daily with food  . pravastatin (PRAVACHOL) 40 MG tablet Take 1 tablet by mouth daily.  . predniSONE (DELTASONE) 50 MG tablet Take 1 tablet (50 mg total) by mouth daily with breakfast.   No facility-administered encounter medications on file as of 06/27/2020.    I called the patient to talk about her COPD.  She stated she was not feeling well, she was having some dizziness and was SOB.  She declined wanting me to call EMS.   I notified Hurley Blevins, CPP of the situation, and asked if the patient could get a acute appointment today.  She was going to notify reception and nurse, but the patient would need to call and make appointment.  I notified the patient I was going to let her go, but she needed to immediately call the office and make an appointment to be seen today, and if her symptoms got any worse she needed to call EMS or go to nearest hospital.  Patient was going to call and make appointment.   Clarita Leber, [place] Pharmacist Assistant [phone]

## 2020-06-28 ENCOUNTER — Other Ambulatory Visit (INDEPENDENT_AMBULATORY_CARE_PROVIDER_SITE_OTHER): Payer: Medicare Other

## 2020-06-28 ENCOUNTER — Other Ambulatory Visit: Payer: Self-pay

## 2020-06-28 DIAGNOSIS — R300 Dysuria: Secondary | ICD-10-CM

## 2020-06-28 LAB — COMPREHENSIVE METABOLIC PANEL
ALT: 8 IU/L (ref 0–32)
AST: 19 IU/L (ref 0–40)
Albumin/Globulin Ratio: 1.7 (ref 1.2–2.2)
Albumin: 3.8 g/dL (ref 3.7–4.7)
Alkaline Phosphatase: 72 IU/L (ref 44–121)
BUN/Creatinine Ratio: 16 (ref 12–28)
BUN: 13 mg/dL (ref 8–27)
Bilirubin Total: 0.4 mg/dL (ref 0.0–1.2)
CO2: 26 mmol/L (ref 20–29)
Calcium: 8.9 mg/dL (ref 8.7–10.3)
Chloride: 99 mmol/L (ref 96–106)
Creatinine, Ser: 0.79 mg/dL (ref 0.57–1.00)
Globulin, Total: 2.2 g/dL (ref 1.5–4.5)
Glucose: 73 mg/dL (ref 65–99)
Potassium: 5 mmol/L (ref 3.5–5.2)
Sodium: 138 mmol/L (ref 134–144)
Total Protein: 6 g/dL (ref 6.0–8.5)
eGFR: 77 mL/min/{1.73_m2} (ref >59)

## 2020-06-28 LAB — POCT URINALYSIS DIP (CLINITEK)
Bilirubin, UA: NEGATIVE
Blood, UA: NEGATIVE
Glucose, UA: NEGATIVE mg/dL
Ketones, POC UA: NEGATIVE mg/dL
Nitrite, UA: NEGATIVE
POC PROTEIN,UA: NEGATIVE
Spec Grav, UA: 1.015 (ref 1.010–1.025)
Urobilinogen, UA: 0.2 E.U./dL
pH, UA: 7 (ref 5.0–8.0)

## 2020-06-28 LAB — CBC WITH DIFFERENTIAL/PLATELET
Basophils Absolute: 0.1 10*3/uL (ref 0.0–0.2)
Basos: 1 %
EOS (ABSOLUTE): 0.2 10*3/uL (ref 0.0–0.4)
Eos: 3 %
Hematocrit: 39.9 % (ref 34.0–46.6)
Hemoglobin: 13.6 g/dL (ref 11.1–15.9)
Immature Grans (Abs): 0 10*3/uL (ref 0.0–0.1)
Immature Granulocytes: 0 %
Lymphocytes Absolute: 1.6 10*3/uL (ref 0.7–3.1)
Lymphs: 24 %
MCH: 31.6 pg (ref 26.6–33.0)
MCHC: 34.1 g/dL (ref 31.5–35.7)
MCV: 93 fL (ref 79–97)
Monocytes Absolute: 0.7 10*3/uL (ref 0.1–0.9)
Monocytes: 10 %
Neutrophils Absolute: 4.3 10*3/uL (ref 1.4–7.0)
Neutrophils: 62 %
Platelets: 232 10*3/uL (ref 150–450)
RBC: 4.31 x10E6/uL (ref 3.77–5.28)
RDW: 13.8 % (ref 11.7–15.4)
WBC: 6.9 10*3/uL (ref 3.4–10.8)

## 2020-06-28 LAB — HEMOGLOBIN A1C
Est. average glucose Bld gHb Est-mCnc: 111 mg/dL
Hgb A1c MFr Bld: 5.5 % (ref 4.8–5.6)

## 2020-06-28 LAB — LIPID PANEL
Chol/HDL Ratio: 3.5 ratio (ref 0.0–4.4)
Cholesterol, Total: 141 mg/dL (ref 100–199)
HDL: 40 mg/dL (ref >39)
LDL Chol Calc (NIH): 88 mg/dL (ref 0–99)
Triglycerides: 64 mg/dL (ref 0–149)
VLDL Cholesterol Cal: 13 mg/dL (ref 5–40)

## 2020-06-28 LAB — CARDIOVASCULAR RISK ASSESSMENT

## 2020-06-28 MED ORDER — MORPHINE SULFATE ER 30 MG PO TBCR: 30.0000 mg | EXTENDED_RELEASE_TABLET | Freq: Three times a day (TID) | ORAL | 0 refills | Status: DC

## 2020-06-28 NOTE — Progress Notes (Signed)
CBC normal, kidney and liver tests normal, sodium and potassium normal, cholesterol normal, no cause for confusion found in blood work lp

## 2020-06-28 NOTE — Progress Notes (Signed)
A1c 5.5 normal range lp

## 2020-07-03 ENCOUNTER — Telehealth: Payer: Self-pay

## 2020-07-03 ENCOUNTER — Other Ambulatory Visit: Payer: Self-pay

## 2020-07-03 NOTE — Telephone Encounter (Signed)
agree

## 2020-07-03 NOTE — Telephone Encounter (Signed)
Antonieta Pert left VM for pt stating pt is concerned the antibiotic is making pt confused and seeing things.   In documentation pt saw Dr. Henrene Pastor 3/24 for same symptoms.   Called pt. Pt states symptoms are the same. She has not taken her BP recently. Pt did attempt to take while on phone but received an error. Pt states she is SOB when walking but no more than normal. In documentation pt is to follow up 1 week from 3/24. Pt was unaware she was due to come back. Appointment was scheduled for tomorrow morning with Dr. Henrene Pastor as he saw pt during first confusion appointment. Dr. Tobie Poet did not have any openings. Routing to both providers as FYI.  Harrell Lark 07/03/20 3:31 PM

## 2020-07-04 ENCOUNTER — Other Ambulatory Visit: Payer: Self-pay

## 2020-07-04 ENCOUNTER — Ambulatory Visit (INDEPENDENT_AMBULATORY_CARE_PROVIDER_SITE_OTHER): Payer: Medicare Other | Admitting: Legal Medicine

## 2020-07-04 ENCOUNTER — Encounter: Payer: Self-pay | Admitting: Legal Medicine

## 2020-07-04 VITALS — BP 110/80 | HR 61 | Temp 97.5°F | Resp 17 | Ht 63.0 in | Wt 126.0 lb

## 2020-07-04 DIAGNOSIS — H532 Diplopia: Secondary | ICD-10-CM

## 2020-07-04 DIAGNOSIS — M353 Polymyalgia rheumatica: Secondary | ICD-10-CM

## 2020-07-04 DIAGNOSIS — F5101 Primary insomnia: Secondary | ICD-10-CM | POA: Diagnosis not present

## 2020-07-04 DIAGNOSIS — N3000 Acute cystitis without hematuria: Secondary | ICD-10-CM

## 2020-07-04 HISTORY — DX: Polymyalgia rheumatica: M35.3

## 2020-07-04 LAB — URINE CULTURE

## 2020-07-04 LAB — POCT URINALYSIS DIP (CLINITEK)
Bilirubin, UA: NEGATIVE
Blood, UA: NEGATIVE
Glucose, UA: NEGATIVE mg/dL
Ketones, POC UA: NEGATIVE mg/dL
Nitrite, UA: NEGATIVE
POC PROTEIN,UA: NEGATIVE
Spec Grav, UA: 1.015 (ref 1.010–1.025)
Urobilinogen, UA: 0.2 E.U./dL
pH, UA: 7.5 (ref 5.0–8.0)

## 2020-07-04 MED ORDER — MIRTAZAPINE 15 MG PO TABS: 15.0000 mg | ORAL_TABLET | Freq: Every day | ORAL | 3 refills | Status: DC

## 2020-07-04 MED ORDER — CITALOPRAM HYDROBROMIDE 20 MG PO TABS: 20.0000 mg | ORAL_TABLET | Freq: Every day | ORAL | 1 refills | Status: DC

## 2020-07-04 MED ORDER — METOPROLOL SUCCINATE ER 25 MG PO TB24: 25.0000 mg | ORAL_TABLET | Freq: Two times a day (BID) | ORAL | 1 refills | Status: DC

## 2020-07-04 MED ORDER — CLOPIDOGREL BISULFATE 75 MG PO TABS: 75.0000 mg | ORAL_TABLET | Freq: Every day | ORAL | 1 refills | Status: DC

## 2020-07-04 MED ORDER — PREDNISONE 10 MG PO TABS: 10.0000 mg | ORAL_TABLET | Freq: Every day | ORAL | 2 refills | Status: DC

## 2020-07-04 MED ORDER — GABAPENTIN 800 MG PO TABS: 800.0000 mg | ORAL_TABLET | Freq: Three times a day (TID) | ORAL | 1 refills | Status: DC

## 2020-07-04 MED ORDER — CIPROFLOXACIN HCL 250 MG PO TABS: 250.0000 mg | ORAL_TABLET | Freq: Two times a day (BID) | ORAL | 0 refills | Status: DC

## 2020-07-04 MED ORDER — DICYCLOMINE HCL 10 MG PO CAPS: ORAL_CAPSULE | ORAL | 1 refills | Status: DC

## 2020-07-04 MED ORDER — PANTOPRAZOLE SODIUM 40 MG PO TBEC: 40.0000 mg | DELAYED_RELEASE_TABLET | Freq: Every day | ORAL | 1 refills | Status: DC

## 2020-07-04 MED ORDER — POTASSIUM CHLORIDE ER 20 MEQ PO TBCR: 1.0000 | EXTENDED_RELEASE_TABLET | Freq: Two times a day (BID) | ORAL | 1 refills | Status: DC

## 2020-07-04 MED ORDER — PRAVASTATIN SODIUM 40 MG PO TABS: 40.0000 mg | ORAL_TABLET | Freq: Every day | ORAL | 1 refills | Status: DC

## 2020-07-04 NOTE — Progress Notes (Signed)
Final urinary results how actinmyces neuii and enterococcus sensitive to macrobid lp

## 2020-07-04 NOTE — Addendum Note (Signed)
Addended by: Thompson Caul I on: 07/04/2020 04:53 PM   Modules accepted: Orders

## 2020-07-04 NOTE — Telephone Encounter (Signed)
Patient brought urine sample to be check and it showed leucocytes 3+. Dr Tobie Poet reviewed the UA and she changed antibiotic from Macrobid to Cipro 250 mg BID x 7 days #14.

## 2020-07-04 NOTE — Progress Notes (Signed)
Subjective:  Patient ID: Donna Little, female    DOB: [birth year]  Age: 75 y.o. MRN: [id number]  Chief Complaint  Patient presents with  . Diplopia    When she was watching tv  . Hallucinations    Patient saw family in the house yesterday and they will not there.    HPI: patient still has headaches, negative CT scan, diplopia  When watching TV.  She sundowns and sleeps during day. She has proximal muscle weakness in pelvis and pain in left trochateric bursa.   Current Outpatient Medications on File Prior to Visit  Medication Sig Dispense Refill  . acetaminophen (TYLENOL) 325 MG tablet Take by mouth.    Marland Kitchen albuterol (VENTOLIN HFA) 108 (90 Base) MCG/ACT inhaler Inhale 2 puffs into the lungs every 6 (six) hours as needed.     . citalopram (CELEXA) 20 MG tablet Take 1 tablet by mouth once daily 90 tablet 0  . clopidogrel (PLAVIX) 75 MG tablet Take 75 mg by mouth daily.    Marland Kitchen denosumab (PROLIA) 60 MG/ML SOSY injection Inject 60 mg into the skin every 6 (six) months.    . diclofenac Sodium (VOLTAREN) 1 % GEL Apply 2 g topically 4 (four) times daily as needed.     . dicyclomine (BENTYL) 10 MG capsule TAKE 1 CAPSULE BY MOUTH 4 TIMES DAILY BEFORE MEAL(S) AND AT BEDTIME 90 capsule 0  . Fluticasone-Umeclidin-Vilant (TRELEGY ELLIPTA) 100-62.5-25 MCG/INH AEPB INHALE 1 PUFF ONCE DAILY USE AT THE SAME TIME EACH DAY 1 each 1  . furosemide (LASIX) 40 MG tablet Take 2 tablets by mouth twice daily 360 tablet 0  . gabapentin (NEURONTIN) 800 MG tablet TAKE 1 TABLET BY MOUTH THREE TIMES DAILY 270 tablet 1  . meclizine (ANTIVERT) 25 MG tablet Take 25 mg by mouth 3 (three) times daily as needed.     . metoprolol succinate (TOPROL-XL) 25 MG 24 hr tablet Take 25 mg by mouth in the morning and at bedtime.     Marland Kitchen morphine (MS CONTIN) 30 MG 12 hr tablet Take 1 tablet (30 mg total) by mouth in the morning, at noon, and at bedtime. 90 tablet 0  . nitrofurantoin, macrocrystal-monohydrate, (MACROBID) 100 MG capsule Take 1  capsule (100 mg total) by mouth 2 (two) times daily. 14 capsule 0  . nitroGLYCERIN (NITROSTAT) 0.4 MG SL tablet Place under the tongue.    . ondansetron (ZOFRAN-ODT) 4 MG disintegrating tablet DISSOLVE 1 TO 2 TABLETS IN MOUTH EVERY 8 HOURS AS NEEDED FOR NAUSEA 40 tablet 0  . oxyCODONE-acetaminophen (PERCOCET) 10-325 MG tablet     . pantoprazole (PROTONIX) 40 MG tablet Take 1 tablet by mouth once daily 90 tablet 1  . Potassium Chloride ER 20 MEQ TBCR Take 1 tablet by mouth twice daily with food 180 tablet 0  . pravastatin (PRAVACHOL) 40 MG tablet Take 1 tablet by mouth daily.     No current facility-administered medications on file prior to visit.   Past Medical History:  Diagnosis Date  . A-fib ([place])   . Atherosclerosis of both carotid arteries 2022   noted per CT on 05/14/20  . Chronic pain syndrome   . Chronic systolic heart failure ([place])   . Depression   . Emphysema of lung ([place]) 2022   noted per CT scan on 05/14/20  . Generalized atherosclerosis   . GERD (gastroesophageal reflux disease)   . Hypertension   . IBS (irritable bowel syndrome)   . Melanoma ([place])   . Other  specified nontoxic goiter   . Palpitations   . Polymyalgia rheumatica ([place])   . Primary pulmonary hypertension ([place])   . Pulmonary embolism ([place])   . Stroke ([place])    ministroke  . Supraventricular tachycardia ([place])   . Type 2 diabetes mellitus ([place])    Past Surgical History:  Procedure Laterality Date  . ABDOMINAL HYSTERECTOMY    . APPENDECTOMY    . CATARACT EXTRACTION    . CORONARY ANGIOPLASTY WITH STENT PLACEMENT    . KYPHOPLASTY  05/2020   L4-L5    Family History  Problem Relation Age of Onset  . Diabetes Other   . Stroke Other   . Heart disease Other    Social History   Socioeconomic History  . Marital status: Single    Spouse name: Not on file  . Number of children: 2  . Years of education: Not on file  . Highest education level: Not on file  Occupational History  . Occupation: retired   Tobacco Use  . Smoking status: Light Tobacco Smoker    Years: 50.00  . Smokeless tobacco: Never Used  Substance and Sexual Activity  . Alcohol use: No    Alcohol/week: 0.0 standard drinks  . Drug use: No  . Sexual activity: Not on file  Other Topics Concern  . Not on file  Social History Narrative  . Not on file   Social Determinants of Health   Financial Resource Strain: Not on file  Food Insecurity: No Food Insecurity  . Worried About Charity fundraiser in the Last Year: Never true  . Ran Out of Food in the Last Year: Never true  Transportation Needs: Not on file  Physical Activity: Not on file  Stress: Not on file  Social Connections: Not on file    Review of Systems   Objective:  BP 110/80   Pulse 61   Temp (!) 97.5 F (36.4 C)   Resp 17   Ht 5\' 3"  (1.6 m)   Wt 126 lb (57.2 kg)   SpO2 93%   BMI 22.32 kg/m   BP/Weight 07/04/2020 06/27/2020 2/45/8099  Systolic BP 833 825 053  Diastolic BP 80 70 52  Wt. (Lbs) 126 124.4 125.6  BMI 22.32 22.04 22.25    Physical Exam  Diabetic Foot Exam - Simple   No data filed      Lab Results  Component Value Date   WBC 6.9 06/27/2020   HGB 13.6 06/27/2020   HCT 39.9 06/27/2020   PLT 232 06/27/2020   GLUCOSE 73 06/27/2020   CHOL 141 06/27/2020   TRIG 64 06/27/2020   HDL 40 06/27/2020   LDLCALC 88 06/27/2020   ALT 8 06/27/2020   AST 19 06/27/2020   NA 138 06/27/2020   K 5.0 06/27/2020   CL 99 06/27/2020   CREATININE 0.79 06/27/2020   BUN 13 06/27/2020   CO2 26 06/27/2020   TSH 1.050 12/26/2019   HGBA1C 5.5 06/27/2020   MICROALBUR 30 01/20/2020      Assessment & Plan:   Diagnoses and all orders for this visit: Diplopia -     MR Brain W Wo Contrast New neurologic symptom.  Get MRI  PMR (polymyalgia rheumatica) (HCC) -     predniSONE (DELTASONE) 10 MG tablet; Take 1 tablet (10 mg total) by mouth daily with breakfast. patient has history of PMR and is showing progressive girdle weakness.  Try low  dose prednisone, need to be carefull with her osteoporosis problem.  She needs to use walker and needs home supervision.  Primary insomnia -     mirtazapine (REMERON) 15 MG tablet; Take 1 tablet (15 mg total) by mouth at bedtime. Try mirtazapine to help sleep but need someone to keep her awake during day.  She is showing sundowning progressive dementia symptoms.         I spent 25 minutes dedicated to the care of this patient on the date of this encounter to include face-to-face time with the patient, as well as: reviewed records  Follow-up: Return in about 2 weeks (around 07/18/2020), or Dr. Tobie Poet.  An After Visit Summary was printed and given to the patient.  Reinaldo Meeker, MD [facility] [phone]

## 2020-07-05 DIAGNOSIS — M47815 Spondylosis without myelopathy or radiculopathy, thoracolumbar region: Secondary | ICD-10-CM | POA: Diagnosis not present

## 2020-07-05 DIAGNOSIS — Z4789 Encounter for other orthopedic aftercare: Secondary | ICD-10-CM | POA: Diagnosis not present

## 2020-07-05 DIAGNOSIS — Z981 Arthrodesis status: Secondary | ICD-10-CM | POA: Diagnosis not present

## 2020-07-05 DIAGNOSIS — Z7389 Other problems related to life management difficulty: Secondary | ICD-10-CM | POA: Diagnosis not present

## 2020-07-05 DIAGNOSIS — Z9889 Other specified postprocedural states: Secondary | ICD-10-CM | POA: Diagnosis not present

## 2020-07-05 DIAGNOSIS — G8929 Other chronic pain: Secondary | ICD-10-CM | POA: Diagnosis not present

## 2020-07-05 DIAGNOSIS — M549 Dorsalgia, unspecified: Secondary | ICD-10-CM | POA: Diagnosis not present

## 2020-07-05 NOTE — Progress Notes (Signed)
Still has UTI Dr. Tobie Poet changed her to Cipro lp

## 2020-07-08 ENCOUNTER — Other Ambulatory Visit: Payer: Self-pay

## 2020-07-10 DIAGNOSIS — G319 Degenerative disease of nervous system, unspecified: Secondary | ICD-10-CM | POA: Diagnosis not present

## 2020-07-10 DIAGNOSIS — R443 Hallucinations, unspecified: Secondary | ICD-10-CM | POA: Diagnosis not present

## 2020-07-10 DIAGNOSIS — G9389 Other specified disorders of brain: Secondary | ICD-10-CM | POA: Diagnosis not present

## 2020-07-10 DIAGNOSIS — H532 Diplopia: Secondary | ICD-10-CM | POA: Diagnosis not present

## 2020-07-10 DIAGNOSIS — R42 Dizziness and giddiness: Secondary | ICD-10-CM | POA: Diagnosis not present

## 2020-07-18 ENCOUNTER — Ambulatory Visit: Payer: Medicare Other | Admitting: Family Medicine

## 2020-07-29 ENCOUNTER — Other Ambulatory Visit: Payer: Self-pay | Admitting: Family Medicine

## 2020-07-29 ENCOUNTER — Other Ambulatory Visit: Payer: Self-pay

## 2020-07-29 ENCOUNTER — Ambulatory Visit (INDEPENDENT_AMBULATORY_CARE_PROVIDER_SITE_OTHER): Payer: Medicare Other | Admitting: Family Medicine

## 2020-07-29 VITALS — BP 78/40 | HR 84 | Temp 96.0°F | Ht 63.0 in | Wt 130.4 lb

## 2020-07-29 DIAGNOSIS — N3000 Acute cystitis without hematuria: Secondary | ICD-10-CM | POA: Diagnosis not present

## 2020-07-29 DIAGNOSIS — R42 Dizziness and giddiness: Secondary | ICD-10-CM | POA: Diagnosis not present

## 2020-07-29 DIAGNOSIS — R8289 Other abnormal findings on cytological and histological examination of urine: Secondary | ICD-10-CM

## 2020-07-29 DIAGNOSIS — I952 Hypotension due to drugs: Secondary | ICD-10-CM | POA: Diagnosis not present

## 2020-07-29 DIAGNOSIS — M5412 Radiculopathy, cervical region: Secondary | ICD-10-CM | POA: Diagnosis not present

## 2020-07-29 DIAGNOSIS — R0789 Other chest pain: Secondary | ICD-10-CM | POA: Diagnosis not present

## 2020-07-29 DIAGNOSIS — M542 Cervicalgia: Secondary | ICD-10-CM | POA: Diagnosis not present

## 2020-07-29 LAB — TROPONIN T: Troponin T (Highly Sensitive): 17 ng/L (ref 0–14)

## 2020-07-29 LAB — COMPREHENSIVE METABOLIC PANEL
ALT: 17 IU/L (ref 0–32)
AST: 23 IU/L (ref 0–40)
Albumin/Globulin Ratio: 1.6 (ref 1.2–2.2)
Albumin: 4.2 g/dL (ref 3.7–4.7)
Alkaline Phosphatase: 80 IU/L (ref 44–121)
BUN/Creatinine Ratio: 28 (ref 12–28)
BUN: 27 mg/dL (ref 8–27)
Bilirubin Total: 0.2 mg/dL (ref 0.0–1.2)
CO2: 29 mmol/L (ref 20–29)
Calcium: 9.2 mg/dL (ref 8.7–10.3)
Chloride: 97 mmol/L (ref 96–106)
Creatinine, Ser: 0.97 mg/dL (ref 0.57–1.00)
Globulin, Total: 2.6 g/dL (ref 1.5–4.5)
Glucose: 170 mg/dL — ABNORMAL HIGH (ref 65–99)
Potassium: 4.6 mmol/L (ref 3.5–5.2)
Sodium: 143 mmol/L (ref 134–144)
Total Protein: 6.8 g/dL (ref 6.0–8.5)
eGFR: 61 mL/min/{1.73_m2} (ref >59)

## 2020-07-29 LAB — CBC WITH DIFFERENTIAL/PLATELET
Basophils Absolute: 0.1 10*3/uL (ref 0.0–0.2)
Basos: 1 %
EOS (ABSOLUTE): 0.2 10*3/uL (ref 0.0–0.4)
Eos: 2 %
Hematocrit: 42.1 % (ref 34.0–46.6)
Hemoglobin: 14.3 g/dL (ref 11.1–15.9)
Immature Grans (Abs): 0 10*3/uL (ref 0.0–0.1)
Immature Granulocytes: 0 %
Lymphocytes Absolute: 1.4 10*3/uL (ref 0.7–3.1)
Lymphs: 15 %
MCH: 33 pg (ref 26.6–33.0)
MCHC: 34 g/dL (ref 31.5–35.7)
MCV: 97 fL (ref 79–97)
Monocytes Absolute: 0.4 10*3/uL (ref 0.1–0.9)
Monocytes: 4 %
Neutrophils Absolute: 7.3 10*3/uL — ABNORMAL HIGH (ref 1.4–7.0)
Neutrophils: 78 %
Platelets: 227 10*3/uL (ref 150–450)
RBC: 4.33 x10E6/uL (ref 3.77–5.28)
RDW: 13.3 % (ref 11.7–15.4)
WBC: 9.4 10*3/uL (ref 3.4–10.8)

## 2020-07-29 LAB — POCT URINALYSIS DIP (CLINITEK)
Bilirubin, UA: NEGATIVE
Blood, UA: NEGATIVE
Glucose, UA: NEGATIVE mg/dL
Ketones, POC UA: NEGATIVE mg/dL
Nitrite, UA: POSITIVE — AB
POC PROTEIN,UA: NEGATIVE
Spec Grav, UA: 1.015 (ref 1.010–1.025)
Urobilinogen, UA: 0.2 E.U./dL
pH, UA: 6.5 (ref 5.0–8.0)

## 2020-07-29 NOTE — Patient Instructions (Addendum)
Stop metoprolol.  Check bp daily with pulse. Keep a log.  Stop prednisone.  Get neck xray.

## 2020-07-29 NOTE — Progress Notes (Signed)
Subjective:  Patient ID: Donna Little, female    DOB: 1945-04-24  Age: 75 y.o. MRN: [id number]  Chief Complaint  Patient presents with  . Follow-up    HPI Patient is following up from bladder infections, hallucinations, diplopia, and dizziness. Diplopia and hallucinations have resolved. Dizziness has improved but not resolved. BP today is 78/40, repeat is 90/40.Marland Kitchen She was not orthostatic at her last visit and her bp was good.  EKG done at the end of March showed mild bradycardia, occasional PVCs, and otherwise normal.  See review of systems for chest discomfort.  Patient has significant osteoporosis as well as chronic back pain.  She was evaluated by Dr. Henrene Pastor in March who put her on prednisone 10 mg once a day when he was concerned she had polymyalgia rheumatica.  Her sed rate was normal.  She is still taking the prednisone.  She hurts all over but she does feel like this is helped a little bit.  She does report having right arm numbness when she moves her head to the right.  She also gets a pain in her right upper back when she does this maneuver.  She has not had a neck x-ray in a long time if at all.  Current Outpatient Medications on File Prior to Visit  Medication Sig Dispense Refill  . acetaminophen (TYLENOL) 325 MG tablet Take by mouth.    Marland Kitchen albuterol (VENTOLIN HFA) 108 (90 Base) MCG/ACT inhaler Inhale 2 puffs into the lungs every 6 (six) hours as needed.     . citalopram (CELEXA) 20 MG tablet Take 1 tablet (20 mg total) by mouth daily. 90 tablet 1  . clopidogrel (PLAVIX) 75 MG tablet Take 1 tablet (75 mg total) by mouth daily. 90 tablet 1  . denosumab (PROLIA) 60 MG/ML SOSY injection Inject 60 mg into the skin every 6 (six) months.    . diclofenac Sodium (VOLTAREN) 1 % GEL Apply 2 g topically 4 (four) times daily as needed.     . dicyclomine (BENTYL) 10 MG capsule TAKE 1 CAPSULE BY MOUTH 4 TIMES DAILY BEFORE MEAL(S) AND AT BEDTIME 360 capsule 1  . Fluticasone-Umeclidin-Vilant  (TRELEGY ELLIPTA) 100-62.5-25 MCG/INH AEPB INHALE 1 PUFF ONCE DAILY USE AT THE SAME TIME EACH DAY 1 each 1  . furosemide (LASIX) 40 MG tablet Take 2 tablets by mouth twice daily 360 tablet 0  . gabapentin (NEURONTIN) 800 MG tablet Take 1 tablet (800 mg total) by mouth 3 (three) times daily. 270 tablet 1  . meclizine (ANTIVERT) 25 MG tablet Take 25 mg by mouth 3 (three) times daily as needed.     . metoprolol succinate (TOPROL-XL) 25 MG 24 hr tablet Take 1 tablet (25 mg total) by mouth in the morning and at bedtime. 180 tablet 1  . mirtazapine (REMERON) 15 MG tablet Take 1 tablet (15 mg total) by mouth at bedtime. 30 tablet 3  . morphine (MS CONTIN) 30 MG 12 hr tablet Take 1 tablet (30 mg total) by mouth in the morning, at noon, and at bedtime. 90 tablet 0  . nitroGLYCERIN (NITROSTAT) 0.4 MG SL tablet Place under the tongue.    . ondansetron (ZOFRAN-ODT) 4 MG disintegrating tablet DISSOLVE 1 TO 2 TABLETS IN MOUTH EVERY 8 HOURS AS NEEDED FOR NAUSEA 40 tablet 0  . oxyCODONE-acetaminophen (PERCOCET) 10-325 MG tablet     . pantoprazole (PROTONIX) 40 MG tablet Take 1 tablet (40 mg total) by mouth daily. 90 tablet 1  . Potassium Chloride ER  20 MEQ TBCR Take 1 tablet by mouth 2 (two) times daily with a meal. 180 tablet 1  . pravastatin (PRAVACHOL) 40 MG tablet Take 1 tablet (40 mg total) by mouth daily. 90 tablet 1  . predniSONE (DELTASONE) 10 MG tablet Take 1 tablet (10 mg total) by mouth daily with breakfast. 30 tablet 2   No current facility-administered medications on file prior to visit.   Past Medical History:  Diagnosis Date  . A-fib (HCC)   . Atherosclerosis of both carotid arteries 2022   noted per CT on 05/14/20  . Chronic pain syndrome   . Chronic systolic heart failure (HCC)   . Depression   . Emphysema of lung (HCC) 2022   noted per CT scan on 05/14/20  . Generalized atherosclerosis   . GERD (gastroesophageal reflux disease)   . Hypertension   . IBS (irritable bowel syndrome)   .  Melanoma (HCC)   . Other specified nontoxic goiter   . Palpitations   . Polymyalgia rheumatica (HCC)   . Primary pulmonary hypertension (HCC)   . Pulmonary embolism (HCC)   . Stroke (HCC)    ministroke  . Supraventricular tachycardia (HCC)   . Type 2 diabetes mellitus (HCC)    Past Surgical History:  Procedure Laterality Date  . ABDOMINAL HYSTERECTOMY    . APPENDECTOMY    . CATARACT EXTRACTION    . CORONARY ANGIOPLASTY WITH STENT PLACEMENT    . KYPHOPLASTY  05/2020   L4-L5    Family History  Problem Relation Age of Onset  . Diabetes Other   . Stroke Other   . Heart disease Other    Social History   Socioeconomic History  . Marital status: Single    Spouse name: Not on file  . Number of children: 2  . Years of education: Not on file  . Highest education level: Not on file  Occupational History  . Occupation: retired  Tobacco Use  . Smoking status: Light Tobacco Smoker    Years: 50.00  . Smokeless tobacco: Never Used  Substance and Sexual Activity  . Alcohol use: No    Alcohol/week: 0.0 standard drinks  . Drug use: No  . Sexual activity: Not on file  Other Topics Concern  . Not on file  Social History Narrative  . Not on file   Social Determinants of Health   Financial Resource Strain: Not on file  Food Insecurity: No Food Insecurity  . Worried About Programme researcher, broadcasting/film/video in the Last Year: Never true  . Ran Out of Food in the Last Year: Never true  Transportation Needs: Not on file  Physical Activity: Not on file  Stress: Not on file  Social Connections: Not on file    Review of Systems  Constitutional: Positive for fatigue. Negative for chills and fever.  HENT: Positive for postnasal drip (some, but not enough she feels to cause hoarseness. ), sore throat (in am and resolves during the day. ) and voice change (hoarseness x 3 week. ). Negative for congestion and ear pain.   Respiratory: Positive for cough and shortness of breath.   Cardiovascular:  Positive for chest pain.  Gastrointestinal: Negative for abdominal pain, constipation, diarrhea, nausea and vomiting.  Genitourinary: Positive for frequency. Negative for dysuria, hematuria and urgency.  Musculoskeletal: Positive for arthralgias (knees and hips.), back pain (kyphoplasty done in 06/2020 and it has helped some. also having neck pain. Neck hurts on the right and with rt lateral turning causes sharp pain  in rt upper back. numbness and tingles of entire rt arm. ) and myalgias (muscles ).       Given prednisone by Dr. Henrene Pastor and is nearly completed. It helped. Made her feel better overall   Skin: Negative for rash.       Bruising.  Neurological: Positive for dizziness and headaches (improving. ).  Psychiatric/Behavioral: Negative for dysphoric mood. The patient is nervous/anxious (anxiety attacks. chest feels heavy, can't catch breath. Chest pain does not radiate.  Feels like it comes on every day more frequently at night. Lasts for 20 minutes. ).    Objective:  BP (!) 78/40   Pulse 84   Temp (!) 96 F (35.6 C)   Ht 5\' 3"  (1.6 m)   Wt 130 lb 6.4 oz (59.1 kg)   BMI 23.10 kg/m   BP/Weight 07/29/2020 07/04/2020 07/31/8339  Systolic BP 78 962 229  Diastolic BP 40 80 70  Wt. (Lbs) 130.4 126 124.4  BMI 23.1 22.32 22.04    Physical Exam Vitals reviewed.  Constitutional:      Appearance: Normal appearance.  Cardiovascular:     Rate and Rhythm: Normal rate and regular rhythm.     Heart sounds: Normal heart sounds.  Pulmonary:     Effort: Pulmonary effort is normal.     Breath sounds: Normal breath sounds.  Abdominal:     Tenderness: There is no abdominal tenderness.  Neurological:     Mental Status: She is alert and oriented to person, place, and time.  Psychiatric:        Mood and Affect: Mood normal.        Behavior: Behavior normal.     Diabetic Foot Exam - Simple   No data filed      Lab Results  Component Value Date   WBC 9.4 07/29/2020   HGB 14.3 07/29/2020    HCT 42.1 07/29/2020   PLT 227 07/29/2020   GLUCOSE 170 (H) 07/29/2020   CHOL 141 06/27/2020   TRIG 64 06/27/2020   HDL 40 06/27/2020   LDLCALC 88 06/27/2020   ALT 17 07/29/2020   AST 23 07/29/2020   NA 143 07/29/2020   K 4.6 07/29/2020   CL 97 07/29/2020   CREATININE 0.97 07/29/2020   BUN 27 07/29/2020   CO2 29 07/29/2020   TSH 1.050 12/26/2019   HGBA1C 5.5 06/27/2020   MICROALBUR 30 01/20/2020      Assessment & Plan:   1. Cervical radiculopathy - DG Cervical Spine Complete  2. Hypotension due to drugs Hold metoprolol.  Check blood pressure daily with pulse.  Keep a log.  3. Dizziness greatly improved.  Check lab work.  Again hold metoprolol as she is bradycardic at times.  And her blood pressure is low. - CBC with Differential/Platelet stat. - Comprehensive metabolic panel stat. - POCT URINALYSIS DIP (CLINITEK)  4. Other chest pain: Atypical.  Low concern.  Check troponin T. - Troponin T stat.  5.  Abnormal urine Urine was spun.  Findings inconsistent with urinalysis dip. - Urine Culture: Await cultures patient's been on 2 different antibiotics for bladder infections in the last month. Will attempt to review recent urine cultures and consider starting her on an antibiotic sooner.   Orders Placed This Encounter  Procedures  . Urine Culture  . DG Cervical Spine Complete  . CBC with Differential/Platelet  . Comprehensive metabolic panel  . Troponin T  . POCT URINALYSIS DIP (CLINITEK)     Follow-up: Return in about 2  weeks (around 08/12/2020) for bp visit.  An After Visit Summary was printed and given to the patient.  [name], MD [facility] [phone]  Addendum: Troponin T came back at 17.  This is 3 points high.  Spoke with patient and sent to [facility] emergency department for evaluation by private vehicle.  She is not to drive herself.

## 2020-07-30 ENCOUNTER — Encounter: Payer: Self-pay | Admitting: Family Medicine

## 2020-07-30 DIAGNOSIS — M1990 Unspecified osteoarthritis, unspecified site: Secondary | ICD-10-CM | POA: Diagnosis present

## 2020-07-30 DIAGNOSIS — I340 Nonrheumatic mitral (valve) insufficiency: Secondary | ICD-10-CM | POA: Diagnosis not present

## 2020-07-30 DIAGNOSIS — F17200 Nicotine dependence, unspecified, uncomplicated: Secondary | ICD-10-CM | POA: Diagnosis present

## 2020-07-30 DIAGNOSIS — I10 Essential (primary) hypertension: Secondary | ICD-10-CM | POA: Diagnosis present

## 2020-07-30 DIAGNOSIS — Z79899 Other long term (current) drug therapy: Secondary | ICD-10-CM | POA: Diagnosis not present

## 2020-07-30 DIAGNOSIS — R001 Bradycardia, unspecified: Secondary | ICD-10-CM | POA: Diagnosis not present

## 2020-07-30 DIAGNOSIS — Z7902 Long term (current) use of antithrombotics/antiplatelets: Secondary | ICD-10-CM | POA: Diagnosis not present

## 2020-07-30 DIAGNOSIS — F17210 Nicotine dependence, cigarettes, uncomplicated: Secondary | ICD-10-CM | POA: Diagnosis present

## 2020-07-30 DIAGNOSIS — R079 Chest pain, unspecified: Secondary | ICD-10-CM | POA: Diagnosis not present

## 2020-07-30 DIAGNOSIS — J449 Chronic obstructive pulmonary disease, unspecified: Secondary | ICD-10-CM | POA: Diagnosis not present

## 2020-07-30 DIAGNOSIS — E7800 Pure hypercholesterolemia, unspecified: Secondary | ICD-10-CM | POA: Diagnosis present

## 2020-07-30 DIAGNOSIS — Z8673 Personal history of transient ischemic attack (TIA), and cerebral infarction without residual deficits: Secondary | ICD-10-CM | POA: Diagnosis not present

## 2020-07-30 DIAGNOSIS — I471 Supraventricular tachycardia: Secondary | ICD-10-CM | POA: Diagnosis present

## 2020-07-30 DIAGNOSIS — I2510 Atherosclerotic heart disease of native coronary artery without angina pectoris: Secondary | ICD-10-CM | POA: Diagnosis present

## 2020-07-30 DIAGNOSIS — K449 Diaphragmatic hernia without obstruction or gangrene: Secondary | ICD-10-CM | POA: Diagnosis present

## 2020-07-30 DIAGNOSIS — R0789 Other chest pain: Secondary | ICD-10-CM | POA: Diagnosis present

## 2020-07-31 DIAGNOSIS — I471 Supraventricular tachycardia: Secondary | ICD-10-CM | POA: Diagnosis not present

## 2020-07-31 DIAGNOSIS — R0789 Other chest pain: Secondary | ICD-10-CM | POA: Diagnosis not present

## 2020-07-31 DIAGNOSIS — J449 Chronic obstructive pulmonary disease, unspecified: Secondary | ICD-10-CM | POA: Diagnosis not present

## 2020-08-01 ENCOUNTER — Other Ambulatory Visit: Payer: Self-pay

## 2020-08-01 DIAGNOSIS — M542 Cervicalgia: Secondary | ICD-10-CM

## 2020-08-01 LAB — URINE CULTURE

## 2020-08-02 ENCOUNTER — Other Ambulatory Visit: Payer: Self-pay

## 2020-08-02 ENCOUNTER — Ambulatory Visit (INDEPENDENT_AMBULATORY_CARE_PROVIDER_SITE_OTHER): Payer: Medicare Other

## 2020-08-02 DIAGNOSIS — M810 Age-related osteoporosis without current pathological fracture: Secondary | ICD-10-CM | POA: Diagnosis not present

## 2020-08-02 MED ORDER — DENOSUMAB 60 MG/ML ~~LOC~~ SOSY
60.0000 mg | PREFILLED_SYRINGE | Freq: Once | SUBCUTANEOUS | Status: AC
  Administered 2020-08-02: 60 mg via SUBCUTANEOUS

## 2020-08-06 ENCOUNTER — Telehealth: Payer: Self-pay

## 2020-08-06 NOTE — Progress Notes (Addendum)
Chronic Care Management Pharmacy Assistant   Name: Donna Little  MRN: [id number] DOB: 1945-11-02  Donna Little is an 75 y.o. year old female who presents for his follow-up CCM visit with the clinical pharmacist.  Reason for Encounter:General Adherence call    Recent office visits:  08/07/2020: Donna Duncans, PA-C (PCP) ORDERS only:  Reorder  Morphine 30mg  tablet, 12h, Take 1 po tid.  07/29/2020: Donna Brome, MD (PCP) / Discontinued Ciprofloxacin 250 mg. (completed course)  Discontinued Nitrofurantoin 100 mg. (completed course)  07/04/2020: Donna Little (PCP) /  Added Mirtazapine 15mg . Tablet po qhs at bedtime. Added Prednisone 10 mg. Tablet po qam. Ordered MRI brain w/ and w/o contrast.  05/30/2020: Donna Brome, MD (PCP) for postconcussion syndrome / Increased Pravastatin from 20mg  to 40 mg., take 1 po qd  05/16/2020: Donna Belfast, NP (PCP) for subsequent falls/ confusion/ CT head w/o contrast ordered/ D/C IA TAC 40mg . (completed course) 1x injection for acute (R) shoulder pain   Recent consult visits:   07/05/2020: Donna Abernethy, PA ( Orthopaedics) / Post-op visit: 2 months s/p Spinal Fusion and Kyphoplasty (DOS: 05/08/2020)   05/09/2020: Donna Ina, MD (Neurosurgery) @ Donna Little / No medication changes noted   05/01/2020: Donna Patten, MD (Cardiology) @ Donna Little for SVT / Ordered Pravastatin 40 mg. Tablet, take 1 po qd.  Little visits:  05/14/2020: Donna Little ED for abd injury, and multiple fx's    Medications: Outpatient Encounter Medications as of 08/06/2020  Medication Sig   acetaminophen (TYLENOL) 325 MG tablet Take by mouth.   albuterol (VENTOLIN HFA) 108 (90 Base) MCG/ACT inhaler Inhale 2 puffs into the lungs every 6 (six) hours as needed.    citalopram (CELEXA) 20 MG tablet Take 1 tablet (20 mg total) by mouth daily.   clopidogrel (PLAVIX) 75 MG tablet Take 1 tablet (75 mg total) by mouth daily.   diclofenac Sodium (VOLTAREN) 1 % GEL Apply 2 g  topically 4 (four) times daily as needed.    dicyclomine (BENTYL) 10 MG capsule TAKE 1 CAPSULE BY MOUTH 4 TIMES DAILY BEFORE MEAL(S) AND AT BEDTIME   Fluticasone-Umeclidin-Vilant (TRELEGY ELLIPTA) 100-62.5-25 MCG/INH AEPB INHALE 1 PUFF ONCE DAILY USE AT THE SAME TIME EACH DAY   furosemide (LASIX) 40 MG tablet Take 2 tablets by mouth twice daily   gabapentin (NEURONTIN) 800 MG tablet Take 1 tablet (800 mg total) by mouth 3 (three) times daily.   meclizine (ANTIVERT) 25 MG tablet Take 25 mg by mouth 3 (three) times daily as needed.    metoprolol succinate (TOPROL-XL) 25 MG 24 hr tablet Take 1 tablet (25 mg total) by mouth in the morning and at bedtime.   mirtazapine (REMERON) 15 MG tablet Take 1 tablet (15 mg total) by mouth at bedtime.   morphine (MS CONTIN) 30 MG 12 hr tablet Take 1 tablet (30 mg total) by mouth in the morning, at noon, and at bedtime.   nitroGLYCERIN (NITROSTAT) 0.4 MG SL tablet Place under the tongue.   ondansetron (ZOFRAN-ODT) 4 MG disintegrating tablet DISSOLVE 1 TO 2 TABLETS IN MOUTH EVERY 8 HOURS AS NEEDED FOR NAUSEA   oxyCODONE-acetaminophen (PERCOCET) 10-325 MG tablet    pantoprazole (PROTONIX) 40 MG tablet Take 1 tablet (40 mg total) by mouth daily.   Potassium Chloride ER 20 MEQ TBCR Take 1 tablet by mouth 2 (two) times daily with a meal.   pravastatin (PRAVACHOL) 40 MG tablet Take 1 tablet (40 mg total) by mouth daily.   predniSONE (DELTASONE)  10 MG tablet Take 1 tablet (10 mg total) by mouth daily with breakfast.   No facility-administered encounter medications on file as of 08/06/2020.     Contacted Donna Little for general disease state call.   The patient reports the following problems with their health.  Patient reported erratic BP and pulse readings since Metoprolol was discontinued. She stated that if her blood pressure is high that she will cut Metoprolol into quarters and take 1/4 of a 25mg . tablet. She stated that it brings her blood pressure back into  range.   The patient reports the following  problems with her pharmacy.  Patient stated she is not having problems with her pharmacy.  she denies side effects with her medications.     DATE:             BP               PULSE 08/08/2020      115/76   70   Patient stated prior to taking 1/4 of metoprolol that it BP was 158/87    Star Rating Drugs:             Last filled:   Days supply Pravastatin            07/04/2020      90DS   Patient states that she has no concerns or questions for Donna Little, Pharm. D at this time.      Donna Little, [place] Clinical Pharmacist Assistant

## 2020-08-07 ENCOUNTER — Other Ambulatory Visit: Payer: Self-pay | Admitting: Physician Assistant

## 2020-08-07 MED ORDER — MORPHINE SULFATE ER 30 MG PO TBCR: 30.0000 mg | EXTENDED_RELEASE_TABLET | Freq: Three times a day (TID) | ORAL | 0 refills | Status: DC

## 2020-08-10 ENCOUNTER — Ambulatory Visit (HOSPITAL_BASED_OUTPATIENT_CLINIC_OR_DEPARTMENT_OTHER)
Admission: RE | Admit: 2020-08-10 | Discharge: 2020-08-10 | Disposition: A | Discharge status: Home / Self Care | Payer: Medicare Other | Source: Ambulatory Visit | Attending: Family Medicine | Admitting: Family Medicine

## 2020-08-10 ENCOUNTER — Other Ambulatory Visit: Payer: Self-pay

## 2020-08-10 DIAGNOSIS — M542 Cervicalgia: Secondary | ICD-10-CM | POA: Insufficient documentation

## 2020-08-12 ENCOUNTER — Encounter: Payer: Self-pay | Admitting: Family Medicine

## 2020-08-12 ENCOUNTER — Ambulatory Visit (INDEPENDENT_AMBULATORY_CARE_PROVIDER_SITE_OTHER): Payer: Medicare Other | Admitting: Family Medicine

## 2020-08-12 VITALS — BP 120/70 | HR 76 | Temp 97.9°F | Resp 18 | Ht 63.0 in | Wt 129.8 lb

## 2020-08-12 DIAGNOSIS — S0003XA Contusion of scalp, initial encounter: Secondary | ICD-10-CM | POA: Diagnosis not present

## 2020-08-12 DIAGNOSIS — W010XXA Fall on same level from slipping, tripping and stumbling without subsequent striking against object, initial encounter: Secondary | ICD-10-CM

## 2020-08-12 DIAGNOSIS — G894 Chronic pain syndrome: Secondary | ICD-10-CM

## 2020-08-12 DIAGNOSIS — I952 Hypotension due to drugs: Secondary | ICD-10-CM

## 2020-08-12 DIAGNOSIS — F1120 Opioid dependence, uncomplicated: Secondary | ICD-10-CM | POA: Diagnosis not present

## 2020-08-12 DIAGNOSIS — R490 Dysphonia: Secondary | ICD-10-CM

## 2020-08-12 NOTE — Progress Notes (Signed)
Subjective:  Patient ID: Donna Little, female    DOB: Apr 20, 1945  Age: 75 y.o. MRN: [id number]  Chief Complaint  Patient presents with  . Hypertension    HPI Hypertension: Held metoprolol for 3 days after ED visit for chest pain and troponin positive. Negative work up. Has now restarted metoprolol xl 25 mg 1/4 pill twice daily. I stopped prednisone at her last visit. Dr. Henrene Pastor had started on it for possible PMR.  Pt tripped over an electrical cord and fell hitting her head 2 days ago. No LOC. Bruised, but no major injuries. Bump on top of her head. Not currently on NOAC. On plavix. .  Pt has chronic back pain and is on MSContin 30 mg one three times a day. Pain is a 4-6/10 usually. Her mri of her neck shows DDD. One level shows possible impingement.   Current Outpatient Medications on File Prior to Visit  Medication Sig Dispense Refill  . acetaminophen (TYLENOL) 325 MG tablet Take by mouth.    Marland Kitchen albuterol (VENTOLIN HFA) 108 (90 Base) MCG/ACT inhaler Inhale 2 puffs into the lungs every 6 (six) hours as needed.     . citalopram (CELEXA) 20 MG tablet Take 1 tablet (20 mg total) by mouth daily. 90 tablet 1  . clopidogrel (PLAVIX) 75 MG tablet Take 1 tablet (75 mg total) by mouth daily. 90 tablet 1  . diclofenac Sodium (VOLTAREN) 1 % GEL Apply 2 g topically 4 (four) times daily as needed.     . dicyclomine (BENTYL) 10 MG capsule TAKE 1 CAPSULE BY MOUTH 4 TIMES DAILY BEFORE MEAL(S) AND AT BEDTIME 360 capsule 1  . Fluticasone-Umeclidin-Vilant (TRELEGY ELLIPTA) 100-62.5-25 MCG/INH AEPB INHALE 1 PUFF ONCE DAILY USE AT THE SAME TIME EACH DAY 1 each 1  . furosemide (LASIX) 40 MG tablet Take 2 tablets by mouth twice daily 360 tablet 0  . gabapentin (NEURONTIN) 800 MG tablet Take 1 tablet (800 mg total) by mouth 3 (three) times daily. 270 tablet 1  . meclizine (ANTIVERT) 25 MG tablet Take 25 mg by mouth 3 (three) times daily as needed.     . metoprolol succinate (TOPROL-XL) 25 MG 24 hr tablet Take 1  tablet (25 mg total) by mouth in the morning and at bedtime. (Patient taking differently: Take 6.25 mg by mouth in the morning and at bedtime.) 180 tablet 1  . mirtazapine (REMERON) 15 MG tablet Take 1 tablet (15 mg total) by mouth at bedtime. 30 tablet 3  . morphine (MS CONTIN) 30 MG 12 hr tablet Take 1 tablet (30 mg total) by mouth in the morning, at noon, and at bedtime. 90 tablet 0  . nitroGLYCERIN (NITROSTAT) 0.4 MG SL tablet Place under the tongue.    . ondansetron (ZOFRAN-ODT) 4 MG disintegrating tablet DISSOLVE 1 TO 2 TABLETS IN MOUTH EVERY 8 HOURS AS NEEDED FOR NAUSEA 40 tablet 0  . pantoprazole (PROTONIX) 40 MG tablet Take 1 tablet (40 mg total) by mouth daily. 90 tablet 1  . Potassium Chloride ER 20 MEQ TBCR Take 1 tablet by mouth 2 (two) times daily with a meal. 180 tablet 1  . pravastatin (PRAVACHOL) 40 MG tablet Take 1 tablet (40 mg total) by mouth daily. 90 tablet 1   No current facility-administered medications on file prior to visit.   Past Medical History:  Diagnosis Date  . A-fib ([place])   . Atherosclerosis of both carotid arteries 2022   noted per CT on 05/14/20  . Chronic pain syndrome   .  Chronic systolic heart failure ([place])   . Depression   . Emphysema of lung ([place]) 2022   noted per CT scan on 05/14/20  . Generalized atherosclerosis   . GERD (gastroesophageal reflux disease)   . Hypertension   . IBS (irritable bowel syndrome)   . Melanoma ([place])   . Other specified nontoxic goiter   . Palpitations   . Polymyalgia rheumatica ([place])   . Primary pulmonary hypertension ([place])   . Pulmonary embolism ([place])   . Stroke ([place])    ministroke  . Supraventricular tachycardia ([place])   . Type 2 diabetes mellitus ([place])    Past Surgical History:  Procedure Laterality Date  . ABDOMINAL HYSTERECTOMY    . APPENDECTOMY    . CATARACT EXTRACTION    . CORONARY ANGIOPLASTY WITH STENT PLACEMENT    . KYPHOPLASTY  05/2020   L4-L5    Family History  Problem Relation Age of Onset  .  Diabetes Other   . Stroke Other   . Heart disease Other    Social History   Socioeconomic History  . Marital status: Single    Spouse name: Not on file  . Number of children: 2  . Years of education: Not on file  . Highest education level: Not on file  Occupational History  . Occupation: retired  Tobacco Use  . Smoking status: Light Tobacco Smoker    Years: 50.00  . Smokeless tobacco: Never Used  . Tobacco comment: smokes 1-2 cigarettes per week.  Substance and Sexual Activity  . Alcohol use: No    Alcohol/week: 0.0 standard drinks  . Drug use: No  . Sexual activity: Not on file  Other Topics Concern  . Not on file  Social History Narrative  . Not on file   Social Determinants of Health   Financial Resource Strain: Not on file  Food Insecurity: No Food Insecurity  . Worried About Charity fundraiser in the Last Year: Never true  . Ran Out of Food in the Last Year: Never true  Transportation Needs: Not on file  Physical Activity: Not on file  Stress: Not on file  Social Connections: Not on file    Review of Systems  Constitutional: Negative for chills, fatigue and fever.  HENT: Positive for voice change (hoarseness). Negative for congestion, ear pain and sore throat.   Respiratory: Negative for cough and shortness of breath.   Cardiovascular: Negative for chest pain.  Gastrointestinal: Negative for abdominal pain, constipation, diarrhea, nausea and vomiting.  Endocrine: Negative for polyuria.  Genitourinary: Negative for dysuria.  Musculoskeletal: Positive for back pain, gait problem and neck pain.  Neurological: Negative for dizziness and headaches.  Psychiatric/Behavioral: Positive for dysphoric mood. The patient is nervous/anxious.      Objective:  BP 120/70   Pulse 76   Temp 97.9 F (36.6 C)   Resp 18   Ht 5\' 3"  (1.6 m)   Wt 129 lb 12.8 oz (58.9 kg)   BMI 22.99 kg/m   BP/Weight 08/12/2020 07/29/2020 2/68/3419  Systolic BP 622 78 297  Diastolic BP 70  40 80  Wt. (Lbs) 129.8 130.4 126  BMI 22.99 23.1 22.32    Physical Exam Vitals reviewed.  Constitutional:      Appearance: Normal appearance.  HENT:     Mouth/Throat:     Comments: hoarse Cardiovascular:     Rate and Rhythm: Normal rate and regular rhythm.     Heart sounds: Normal heart sounds.  Pulmonary:  Effort: Pulmonary effort is normal.     Breath sounds: Normal breath sounds.  Abdominal:     Palpations: Abdomen is soft.     Tenderness: There is no abdominal tenderness.  Musculoskeletal:        General: Tenderness (lumbar and thoracic spine. baseline. Tender over left scalp and left forehead. Bruised. ) present.  Neurological:     Mental Status: She is alert.     Diabetic Foot Exam - Simple   No data filed      Lab Results  Component Value Date   WBC 9.4 07/29/2020   HGB 14.3 07/29/2020   HCT 42.1 07/29/2020   PLT 227 07/29/2020   GLUCOSE 170 (H) 07/29/2020   CHOL 141 06/27/2020   TRIG 64 06/27/2020   HDL 40 06/27/2020   LDLCALC 88 06/27/2020   ALT 17 07/29/2020   AST 23 07/29/2020   NA 143 07/29/2020   K 4.6 07/29/2020   CL 97 07/29/2020   CREATININE 0.97 07/29/2020   BUN 27 07/29/2020   CO2 29 07/29/2020   TSH 1.050 12/26/2019   HGBA1C 5.5 06/27/2020   MICROALBUR 30 01/20/2020      Assessment & Plan:   1. Uncomplicated opioid dependence ([place]) - [facility] ([facility]), Ur  2. Contusion of scalp, initial encounter Improved   3. Fall on same level from tripping as cause of accidental injury Discussed importance of avoidance of cords on floor.   4. Chronic pain syndrome Lumbar back pain and neck pain.   5. Hypotension due to drugs  Metoprolol xl 25 mg 1/4 pill twice daily.   6. Hoarseness Refer to ent.   Follow-up: Return in about 3 months (around 11/12/2020) for fasting.  An After Visit Summary was printed and given to the patient.  [name], MD [facility] [phone]

## 2020-08-12 NOTE — Patient Instructions (Signed)
I, Donna Little, bowie that I have pain that has not been adequately controlled with other medications and that my function is limited by my pain. I understand that the intent of the medication is to increase my ability to do more, though the medication is unlikely to eliminate the pain. 1. ____ I will take the medication only as prescribed. I will not take any sedatives, alcohol or other pain medications without the prior approval of my doctor. 2. ____ I understand that the medication will be prescribed only by Dr. ______________________and only according to the agreed-upon schedule. Prescriptions will be provided only during regularly scheduled appointments. Refills will never be provided by telephone. 3. ____ I will not seek or accept any medications for pain other than those prescribed by my doctor. "Medications for pain" includes prescriptions from other doctors, medications borrowed or accepted from family or friends, and any illicit or street drugs.  4. ____ Medication refills will be provided by e prescription. No refills can be given on narcotics, so patient must call giving [place] a 48 hour prior notice when needed.    5. ____ If I do not keep my appointment, I will not receive a refill. Two (2) appointment cancellations with less than one working day's notice or two (2) no-show appointments may constitute grounds for immediate termination of this agreement. 6._____ I understand that my doctor is under no obligation to provide these medications to me, and that she or he reserves the right to discontinue these medications at any time.  7._____ At my doctor's discretion, I agree to cooperate with random drug testing, which may be requested at any time. If I refuse, I understand the medication will be stopped. 8. ____ I understand that lost or stolen medications will not be refilled under any circumstances. It is my responsibility to protect and secure any medications. This includes keeping the  medication out of reach of children. A copy of a police report will be required for any lost or stolen narcotics or narcotic prescriptions. 9._____ I understand that my doctor may require specialist evaluation of my treatment, and I agree to keep appointments when my physician refers me. My doctor will send a report of my care and a copy of this agreement when a referral is made. 95. _____ In addition to the above agreements, I accept the right of my doctor's medical staff to terminate this agreement for any of the following reasons: 1. I seek or obtain any pain medication from a source other than my doctor. 2. I give, sell or in any way distribute prescribed medications to any other person(s). 3. I in any way attempt to forge or alter a prescription. 4. My medical condition declines to the point at which, in the judgment of my doctor, continued therapy with this medication presents a danger to my well-being or safety. 5. There is evidence that I am no longer receiving a reasonable therapeutic benefit from the medication, or my doctor determines that I am no longer a good candidate to continue the medication. 42. _____ I agree to fill my prescriptions only at the pharmacy I list below. If I change pharmacies, I will contact my doctor's office and provide them with the name, address and phone number of the new pharmacy. Under no circumstances will I obtain medications from more than one pharmacy at a time. In order to verify appropriate medication use, my doctor's office will provide my chosen pharmacy with a copy of this agreement. 12.  _____ I understand that any alteration in my medication prescriptions will require a new written agreement. Pharmacy name [facility]  Pharmacy telephone ____[phone]_________________________ Pharmacy address _____[address], [place], [place] [address]_____________ _________________________________________________________________________  Medication name, dose and directions   1.MSCONTIN 30 mg one three times a day  90 pills   Frequency of appointments every 90 days  I understand that by signing this agreement, I must abide by the rules reviewed above and that failure to abide by these agreements will result in the termination of medication prescriptions and possibly the termination of services from my doctor and his or her practice.  ____________________             __ / ___/ ___      __________________     __ / ___ / ___     Patient signature    Date    Physician signature           Date  MEDICATION USE AGREEMENT

## 2020-08-13 LAB — PAIN MGT SCRN (14 DRUGS), UR
Amphetamine Scrn, Ur: NEGATIVE ng/mL
BARBITURATE SCREEN URINE: NEGATIVE ng/mL
BENZODIAZEPINE SCREEN, URINE: NEGATIVE ng/mL
Buprenorphine, Urine: NEGATIVE ng/mL
CANNABINOIDS UR QL SCN: NEGATIVE ng/mL
Cocaine (Metab) Scrn, Ur: NEGATIVE ng/mL
Creatinine(Crt), U: 66.5 mg/dL (ref 20.0–300.0)
Fentanyl, Urine: NEGATIVE pg/mL
Meperidine Screen, Urine: NEGATIVE ng/mL
Methadone Screen, Urine: NEGATIVE ng/mL
OXYCODONE+OXYMORPHONE UR QL SCN: NEGATIVE ng/mL
Opiate Scrn, Ur: POSITIVE ng/mL — AB
Ph of Urine: 6.5 (ref 4.5–8.9)
Phencyclidine Qn, Ur: NEGATIVE ng/mL
Propoxyphene Scrn, Ur: NEGATIVE ng/mL
Tramadol Screen, Urine: NEGATIVE ng/mL

## 2020-08-14 ENCOUNTER — Telehealth: Payer: Self-pay

## 2020-08-14 NOTE — Telephone Encounter (Signed)
Pt called questioning if she was going to be referred to ENT. Did not see documentation or referral. Was last seen 08/12/20. Please advise.   Royce Macadamia, [place] 08/14/20 3:09 PM

## 2020-08-14 NOTE — Telephone Encounter (Signed)
Yes. And I sent referral. kc

## 2020-08-15 NOTE — Telephone Encounter (Signed)
Attempted to call pt. No answer. Could not leave VM as it has not been set up. Will try again later to make pt aware.   Royce Macadamia, [place] 08/15/20 10:42 AM

## 2020-08-15 NOTE — Telephone Encounter (Signed)
Pt returned call. Pt VU of referral and UDS.   Royce Macadamia, [place] 08/15/20 2:42 PM

## 2020-08-28 DIAGNOSIS — I471 Supraventricular tachycardia: Secondary | ICD-10-CM | POA: Diagnosis not present

## 2020-08-28 DIAGNOSIS — I739 Peripheral vascular disease, unspecified: Secondary | ICD-10-CM | POA: Diagnosis not present

## 2020-08-28 DIAGNOSIS — I2782 Chronic pulmonary embolism: Secondary | ICD-10-CM | POA: Diagnosis not present

## 2020-08-28 DIAGNOSIS — I10 Essential (primary) hypertension: Secondary | ICD-10-CM | POA: Diagnosis not present

## 2020-08-28 DIAGNOSIS — R0989 Other specified symptoms and signs involving the circulatory and respiratory systems: Secondary | ICD-10-CM | POA: Insufficient documentation

## 2020-08-29 DIAGNOSIS — M545 Low back pain, unspecified: Secondary | ICD-10-CM | POA: Diagnosis not present

## 2020-08-29 DIAGNOSIS — G8929 Other chronic pain: Secondary | ICD-10-CM | POA: Diagnosis not present

## 2020-08-29 DIAGNOSIS — M4180 Other forms of scoliosis, site unspecified: Secondary | ICD-10-CM | POA: Diagnosis not present

## 2020-09-01 DIAGNOSIS — E873 Alkalosis: Secondary | ICD-10-CM | POA: Diagnosis not present

## 2020-09-01 DIAGNOSIS — J811 Chronic pulmonary edema: Secondary | ICD-10-CM | POA: Diagnosis not present

## 2020-09-01 DIAGNOSIS — N390 Urinary tract infection, site not specified: Secondary | ICD-10-CM | POA: Diagnosis not present

## 2020-09-01 DIAGNOSIS — I2510 Atherosclerotic heart disease of native coronary artery without angina pectoris: Secondary | ICD-10-CM | POA: Diagnosis present

## 2020-09-01 DIAGNOSIS — E872 Acidosis: Secondary | ICD-10-CM | POA: Diagnosis not present

## 2020-09-01 DIAGNOSIS — I700 Atherosclerosis of aorta: Secondary | ICD-10-CM | POA: Diagnosis not present

## 2020-09-01 DIAGNOSIS — I10 Essential (primary) hypertension: Secondary | ICD-10-CM | POA: Diagnosis present

## 2020-09-01 DIAGNOSIS — K529 Noninfective gastroenteritis and colitis, unspecified: Secondary | ICD-10-CM | POA: Diagnosis not present

## 2020-09-01 DIAGNOSIS — Z885 Allergy status to narcotic agent status: Secondary | ICD-10-CM | POA: Diagnosis not present

## 2020-09-01 DIAGNOSIS — G9340 Encephalopathy, unspecified: Secondary | ICD-10-CM | POA: Diagnosis not present

## 2020-09-01 DIAGNOSIS — J42 Unspecified chronic bronchitis: Secondary | ICD-10-CM | POA: Diagnosis present

## 2020-09-01 DIAGNOSIS — G8929 Other chronic pain: Secondary | ICD-10-CM | POA: Diagnosis present

## 2020-09-01 DIAGNOSIS — Z79899 Other long term (current) drug therapy: Secondary | ICD-10-CM | POA: Diagnosis not present

## 2020-09-01 DIAGNOSIS — F17210 Nicotine dependence, cigarettes, uncomplicated: Secondary | ICD-10-CM | POA: Diagnosis present

## 2020-09-01 DIAGNOSIS — K5790 Diverticulosis of intestine, part unspecified, without perforation or abscess without bleeding: Secondary | ICD-10-CM | POA: Diagnosis not present

## 2020-09-01 DIAGNOSIS — M4856XA Collapsed vertebra, not elsewhere classified, lumbar region, initial encounter for fracture: Secondary | ICD-10-CM | POA: Diagnosis not present

## 2020-09-01 DIAGNOSIS — I252 Old myocardial infarction: Secondary | ICD-10-CM | POA: Diagnosis not present

## 2020-09-01 DIAGNOSIS — R4182 Altered mental status, unspecified: Secondary | ICD-10-CM | POA: Diagnosis not present

## 2020-09-01 DIAGNOSIS — M1990 Unspecified osteoarthritis, unspecified site: Secondary | ICD-10-CM | POA: Diagnosis present

## 2020-09-01 DIAGNOSIS — G928 Other toxic encephalopathy: Secondary | ICD-10-CM | POA: Diagnosis present

## 2020-09-01 DIAGNOSIS — R410 Disorientation, unspecified: Secondary | ICD-10-CM | POA: Diagnosis not present

## 2020-09-01 DIAGNOSIS — Z85828 Personal history of other malignant neoplasm of skin: Secondary | ICD-10-CM | POA: Diagnosis not present

## 2020-09-01 DIAGNOSIS — D649 Anemia, unspecified: Secondary | ICD-10-CM | POA: Diagnosis present

## 2020-09-01 DIAGNOSIS — F419 Anxiety disorder, unspecified: Secondary | ICD-10-CM | POA: Diagnosis present

## 2020-09-01 DIAGNOSIS — F32A Depression, unspecified: Secondary | ICD-10-CM | POA: Diagnosis present

## 2020-09-03 ENCOUNTER — Ambulatory Visit: Payer: Medicare Other | Admitting: Family Medicine

## 2020-09-05 NOTE — Progress Notes (Deleted)
Chronic Care Management Pharmacy Note  09/05/2020 Name:  Donna Little MRN:  [id number] DOB:  1946/03/15  Summary: ***  Recommendations/Changes made from today's visit: ***  Plan: ***  Subjective: Donna Little is an 75 y.o. year old female who is a primary patient of Cox, Kirsten, MD.  The CCM team was consulted for assistance with disease management and care coordination needs.    Engaged with patient by telephone for follow up visit in response to provider referral for pharmacy case management and/or care coordination services.   Consent to Services:  The patient was given information about Chronic Care Management services, agreed to services, and gave verbal consent prior to initiation of services.  Please see initial visit note for detailed documentation.   Patient Care Team: [name], MD as PCP - General (Family Medicine) Atilano Median, Gayleen Orem, MD as Referring Physician (Cardiology) Nehemiah Settle, MD (Gastroenterology) Pieter Partridge, DO as Consulting Physician (Neurology) Gavin Pound, MD as Consulting Physician (Rheumatology) Debby Bud., MD (Surgery) Burnice Logan, [facility] as Pharmacist (Pharmacist) [name], MD as Referring Physician (Neurosurgery)  Recent office visits:   08/12/2020 - metoprolol xl 25 mg 1/4 tablet twice daily. Refer to ENT for hoarseness. Pain management contract. Avoid cords on floor to avoid falls.   07/29/2020: [name], MD (PCP) / Discontinued Ciprofloxacin 250 mg. (completed course)  Discontinued Nitrofurantoin 100 mg. (completed course)  07/04/2020: [name] (PCP) /  Added Mirtazapine $RemoveBeforeDEI'15mg'SlHUCviGxXkHRURA$ . Tablet po qhs at bedtime. Added Prednisone 10 mg. Tablet po qam. Ordered MRI brain w/ and w/o contrast.  05/30/2020: [name], MD (PCP) for postconcussion syndrome / Increased Pravastatin from $RemoveBeforeDE'20mg'EZiQCZenXhqPVFq$  to 40 mg., take 1 po qd  05/16/2020: [name], NP (PCP) for subsequent falls/ confusion/ CT head w/o contrast  ordered/ D/C IA TAC $Remo'40mg'CkUHW$ . (completed course) 1x injection for acute (R) shoulder pain   Recent consult visits:   08/29/2020 - Orthopedics and Sports Medicine - follow-up from surgery. Pain continues to improve. Follow-up in 4 months. Continue to consider aquatic PT.   08/28/2020 - Cardiology - carotid duplex to evaluate right carotid bruit. Monitor blood pressure for 2 weeks. Recommend LDL <70.   07/05/2020: [name], PA ( Orthopaedics) / Post-op visit: 2 months s/p Spinal Fusion and Kyphoplasty (DOS: 05/08/2020)   05/09/2020: [name], MD (Neurosurgery) @ [facility] / No medication changes noted   05/01/2020: [name], MD (Cardiology) @ [facility] for SVT / Ordered Pravastatin 40 mg. Tablet, take 1 po qd.  Hospital visits:  09/02/2020 - 09/04/2020 - Hospital for UTI and Colitis. Discharged on Ceftin and probiotic.  07/30/2020-07/31/2020 - hospital for chest pain.  05/14/2020: [facility] ED for abd injury, and multiple fx's    Objective:  Lab Results  Component Value Date   CREATININE 0.97 07/29/2020   BUN 27 07/29/2020   GFRNONAA 75 01/16/2020   GFRAA 87 01/16/2020   NA 143 07/29/2020   K 4.6 07/29/2020   CALCIUM 9.2 07/29/2020   CO2 29 07/29/2020   GLUCOSE 170 (H) 07/29/2020    Lab Results  Component Value Date/Time   HGBA1C 5.5 06/27/2020 02:18 PM   HGBA1C 5.8 (H) 01/16/2020 09:21 AM   MICROALBUR 30 01/20/2020 08:00 PM    Last diabetic Eye exam: No results found for: HMDIABEYEEXA  Last diabetic Foot exam: No results found for: HMDIABFOOTEX   Lab Results  Component Value Date   CHOL 141 06/27/2020   HDL 40 06/27/2020   LDLCALC 88 06/27/2020  TRIG 64 06/27/2020   CHOLHDL 3.5 06/27/2020    Hepatic Function Latest Ref Rng & Units 07/29/2020 06/27/2020 01/16/2020  Total Protein 6.0 - 8.5 g/dL 6.8 6.0 7.2  Albumin 3.7 - 4.7 g/dL 4.2 3.8 4.2  AST 0 - 40 IU/L 23 19 20   ALT 0 - 32 IU/L 17 8 8   Alk Phosphatase 44 - 121 IU/L 80 72 123(H)  Total  Bilirubin 0.0 - 1.2 mg/dL 0.2 0.4 0.3    Lab Results  Component Value Date/Time   TSH 1.050 12/26/2019 03:46 PM    CBC Latest Ref Rng & Units 07/29/2020 06/27/2020 12/26/2019  WBC 3.4 - 10.8 x10E3/uL 9.4 6.9 7.5  Hemoglobin 11.1 - 15.9 g/dL 14.3 13.6 13.5  Hematocrit 34.0 - 46.6 % 42.1 39.9 41.3  Platelets 150 - 450 x10E3/uL 227 232 236    No results found for: VD25OH  Clinical ASCVD: {YES/NO:21197} The 10-year ASCVD risk score Mikey Bussing DC Jr., et al., 2013) is: 56.9%   Values used to calculate the score:     Age: 33 years     Sex: Female     Is Non-Hispanic African American: No     Diabetic: Yes     Tobacco smoker: Yes     Systolic Blood Pressure: 575 mmHg     Is BP treated: Yes     HDL Cholesterol: 40 mg/dL     Total Cholesterol: 141 mg/dL    Depression screen [facility] 2/9 05/30/2020 03/18/2020 01/16/2020  Decreased Interest 3 3 1   Down, Depressed, Hopeless 3 3 1   PHQ - 2 Score 6 6 2   Altered sleeping 3 3 1   Tired, decreased energy 3 3 1   Change in appetite 3 3 3   Feeling bad or failure about yourself  0 0 0  Trouble concentrating 0 0 0  Moving slowly or fidgety/restless 3 0 0  Suicidal thoughts 0 0 0  PHQ-9 Score 18 15 7   Difficult doing work/chores Extremely dIfficult Very difficult -     ***Other: (CHADS2VASc if Afib, MMRC or CAT for COPD, ACT, DEXA)  Social History   Tobacco Use  Smoking Status Light Tobacco Smoker  . Years: 50.00  Smokeless Tobacco Never Used  Tobacco Comment   smokes 1-2 cigarettes per week.   BP Readings from Last 3 Encounters:  08/12/20 120/70  07/29/20 (!) 78/40  07/04/20 110/80   Pulse Readings from Last 3 Encounters:  08/12/20 76  07/29/20 84  07/04/20 61   Wt Readings from Last 3 Encounters:  08/12/20 129 lb 12.8 oz (58.9 kg)  07/29/20 130 lb 6.4 oz (59.1 kg)  07/04/20 126 lb (57.2 kg)   BMI Readings from Last 3 Encounters:  08/12/20 22.99 kg/m  07/29/20 23.10 kg/m  07/04/20 22.32 kg/m    Assessment/Interventions:  Review of patient past medical history, allergies, medications, health status, including review of consultants reports, laboratory and other test data, was performed as part of comprehensive evaluation and provision of chronic care management services.   SDOH:  (Social Determinants of Health) assessments and interventions performed: {yes/no:20286}  SDOH Screenings   Alcohol Screen: Not on file  Depression (PHQ2-9): Medium Risk  . PHQ-2 Score: 18  Financial Resource Strain: Not on file  Food Insecurity: No Food Insecurity  . Worried About Charity fundraiser in the Last Year: Never true  . Ran Out of Food in the Last Year: Never true  Housing: Low Risk   . Last Housing Risk Score: 0  Physical Activity: Not  on file  Social Connections: Not on file  Stress: Not on file  Tobacco Use: High Risk  . Smoking Tobacco Use: Light Tobacco Smoker  . Smokeless Tobacco Use: Never Used  Transportation Needs: Not on file    CCM Care Plan  Allergies  Allergen Reactions  . Codeine   . Methocarbamol   . Sertraline     Insomnia   . Tizanidine     Medications Reviewed Today    Reviewed by [name], MD (Physician) on 08/12/20 at 1533  Med List Status: <None>  Medication Order Taking? Sig Documenting Provider Last Dose Status Informant  acetaminophen (TYLENOL) 325 MG tablet [id number] No Take by mouth. [provider] Taking Active   albuterol (VENTOLIN HFA) 108 (90 Base) MCG/ACT inhaler [id number] No Inhale 2 puffs into the lungs every 6 (six) hours as needed.  [provider] Taking Active   citalopram (CELEXA) 20 MG tablet [id number]  Take 1 tablet (20 mg total) by mouth daily. [name], PA-C  Active   clopidogrel (PLAVIX) 75 MG tablet [id number]  Take 1 tablet (75 mg total) by mouth daily. [name], PA-C  Active   diclofenac Sodium (VOLTAREN) 1 % GEL [id number] No Apply 2 g topically 4 (four) times daily as needed.  [provider] Taking Active Self   dicyclomine (BENTYL) 10 MG capsule [id number]  TAKE 1 CAPSULE BY MOUTH 4 TIMES DAILY BEFORE MEAL(S) AND AT BEDTIME [name], PA-C  Active   Fluticasone-Umeclidin-Vilant (TRELEGY ELLIPTA) 100-62.5-25 MCG/INH AEPB [id number] No INHALE 1 PUFF ONCE DAILY USE AT THE SAME TIME EACH DAY [name], PA-C Taking Active   furosemide (LASIX) 40 MG tablet [id number] No Take 2 tablets by mouth twice daily Cox, Kirsten, MD Taking Active   gabapentin (NEURONTIN) 800 MG tablet [id number]  Take 1 tablet (800 mg total) by mouth 3 (three) times daily. [name], PA-C  Active   meclizine (ANTIVERT) 25 MG tablet [id number] No Take 25 mg by mouth 3 (three) times daily as needed.  [provider] Taking Active   metoprolol succinate (TOPROL-XL) 25 MG 24 hr tablet [id number]  Take 1 tablet (25 mg total) by mouth in the morning and at bedtime.  Patient taking differently: Take 6.25 mg by mouth in the morning and at bedtime.   [name], PA-C  Active   mirtazapine (REMERON) 15 MG tablet [id number]  Take 1 tablet (15 mg total) by mouth at bedtime. [name], MD  Active   morphine (MS CONTIN) 30 MG 12 hr tablet [id number]  Take 1 tablet (30 mg total) by mouth in the morning, at noon, and at bedtime. [name], PA-C  Active   nitroGLYCERIN (NITROSTAT) 0.4 MG SL tablet [id number] No Place under the tongue. [provider] Taking Active   ondansetron (ZOFRAN-ODT) 4 MG disintegrating tablet [id number] No DISSOLVE 1 TO 2 TABLETS IN MOUTH EVERY 8 HOURS AS NEEDED FOR NAUSEA Cox, Kirsten, MD Taking Active   oxyCODONE-acetaminophen (PERCOCET) 10-325 MG tablet [id number] No  [provider] Taking Active   pantoprazole (PROTONIX) 40 MG tablet [id number]  Take 1 tablet (40 mg total) by mouth daily. [name], PA-C  Active   Potassium Chloride ER 20 MEQ TBCR [id number]  Take 1 tablet by mouth 2 (two) times daily with a meal. [name], PA-C  Active   pravastatin (PRAVACHOL) 40 MG tablet [id number]   Take 1 tablet (40 mg total) by mouth daily. [name], PA-C  Active  Patient Active Problem List   Diagnosis Date Noted  . PMR (polymyalgia rheumatica) ([place]) 07/04/2020  . Mild recurrent major depression ([place]) 06/27/2020  . Combined hyperlipidemia associated with type 2 diabetes mellitus ([place]) 06/27/2020  . Right hip pain 09/21/2019  . Contusion of scalp 09/21/2019  . Rash 09/21/2019  . Confusion 09/21/2019  . Back pain with sciatica 08/21/2019  . Incontinence of urine in female 08/21/2019  . Trigger finger, right ring finger 06/26/2019  . Pulmonary embolism ([place]) 01/12/2017  . Cerebrovascular disease 01/16/2015  . Hyperlipidemia 01/16/2015  . Hypertensive heart disease with heart failure ([place]) 01/16/2015  . Tobacco abuse 01/16/2015    Immunization History  Administered Date(s) Administered  . Fluad Quad(high Dose 65+) 12/20/2019  . Influenza-Unspecified 12/19/2018  . PFIZER(Purple Top)SARS-COV-2 Vaccination 05/13/2019, 06/04/2019, 12/28/2019  . Pneumococcal Conjugate-13 03/21/2014  . Pneumococcal Polysaccharide-23 05/27/2018  . Zoster Recombinat (Shingrix) 12/16/2017, 05/27/2018    Conditions to be addressed/monitored:  Diabetes, Coronary Artery Disease and COPD  There are no care plans that you recently modified to display for this patient.    Medication Assistance: {MEDASSISTANCEINFO:25044}  Compliance/Adherence/Medication fill history: Care Gaps: ***  Star-Rating Drugs: ***  Patient's preferred pharmacy is:  [facility] [address], [place] [address] [place] [address] Phone: [phone] Fax: [phone]  Uses pill box? {Yes or If no, why not?:20788} Pt endorses ***% compliance  We discussed: Current pharmacy is preferred with insurance plan and patient is satisfied with pharmacy services Patient decided to: Continue current medication management strategy  Care Plan and Follow Up Patient Decision:   Patient agrees to Care Plan and Follow-up.  Plan: Telephone follow up appointment with care management team member scheduled for:  ***  ***   Current Barriers:  . {pharmacybarriers:24917}  Pharmacist Clinical Goal(s):  Marland Kitchen Patient will {PHARMACYGOALCHOICES:24921} through collaboration with PharmD and provider.   Interventions: . 1:1 collaboration with [name], MD regarding development and update of comprehensive plan of care as evidenced by provider attestation and co-signature . Inter-disciplinary care team collaboration (see longitudinal plan of care) . Comprehensive medication review performed; medication list updated in electronic medical record  Hyperlipidemia/CAD: (LDL goal < 70) -{US controlled/uncontrolled:25276} -Current treatment: . Pravastatin 40 mg daily  . Nitroglycerin 0.4 mg sl prn . Clopidogrel 75 mg daily   -Medications previously tried: ***  -Current dietary patterns: *** -Current exercise habits: *** -Educated on {CCM HLD Counseling:25126} -{CCMPHARMDINTERVENTION:25122}  Diabetes (A1c goal {A1c goals:23924}) -{US controlled/uncontrolled:25276} -Current medications: . ***diet and lifestyle  -Medications previously tried: ***  -Current home glucose readings . fasting glucose: *** . post prandial glucose: *** -{ACTIONS;DENIES/REPORTS:21021675::"Denies"} hypoglycemic/hyperglycemic symptoms -Current meal patterns:  . breakfast: ***  . lunch: ***  . dinner: *** . snacks: *** . drinks: *** -Current exercise: *** -Educated on {CCM DM COUNSELING:25123} -Counseled to check feet daily and get yearly eye exams -{CCMPHARMDINTERVENTION:25122}  COPD (Goal: control symptoms and prevent exacerbations) -{US controlled/uncontrolled:25276} -Current treatment  . ***Trelegy 100-62.5-25 mcg/inh 1 puff once daily  . Albuterol 2 puffs into the lungs every 6 hours prn  -Medications previously tried: ***  -Gold Grade: {CHL [facility] COPD Gold  GYFVC:[id number]} -Current COPD Classification:  {CHL [facility] COPD [phone]} -MMRC/CAT score: *** -Pulmonary function testing: *** -Exacerbations requiring treatment in last 6 months: *** -Patient {Actions; denies-reports:120008} consistent use of maintenance inhaler -Frequency of rescue inhaler use: *** -Counseled on {CCMINHALERCOUNSELING:25121} -{CCMPHARMDINTERVENTION:25122}   Patient Goals/Self-Care Activities . Patient will:  - {pharmacypatientgoals:24919}  Follow  Up Plan: {CM FOLLOW UP XAJL:87276}

## 2020-09-06 ENCOUNTER — Other Ambulatory Visit: Payer: Self-pay

## 2020-09-06 ENCOUNTER — Other Ambulatory Visit: Payer: Self-pay | Admitting: Family Medicine

## 2020-09-06 ENCOUNTER — Ambulatory Visit (INDEPENDENT_AMBULATORY_CARE_PROVIDER_SITE_OTHER): Payer: Medicare Other | Admitting: Nurse Practitioner

## 2020-09-06 ENCOUNTER — Encounter: Payer: Self-pay | Admitting: Nurse Practitioner

## 2020-09-06 VITALS — BP 98/52 | HR 102 | Temp 97.6°F | Ht 63.0 in | Wt 126.0 lb

## 2020-09-06 DIAGNOSIS — F17218 Nicotine dependence, cigarettes, with other nicotine-induced disorders: Secondary | ICD-10-CM | POA: Diagnosis not present

## 2020-09-06 DIAGNOSIS — E0590 Thyrotoxicosis, unspecified without thyrotoxic crisis or storm: Secondary | ICD-10-CM | POA: Diagnosis not present

## 2020-09-06 DIAGNOSIS — R000 Tachycardia, unspecified: Secondary | ICD-10-CM

## 2020-09-06 DIAGNOSIS — N3001 Acute cystitis with hematuria: Secondary | ICD-10-CM

## 2020-09-06 DIAGNOSIS — G894 Chronic pain syndrome: Secondary | ICD-10-CM | POA: Diagnosis not present

## 2020-09-06 DIAGNOSIS — I952 Hypotension due to drugs: Secondary | ICD-10-CM

## 2020-09-06 MED ORDER — PRAMIPEXOLE DIHYDROCHLORIDE 0.25 MG PO TABS: 0.2500 mg | ORAL_TABLET | Freq: Every day | ORAL | 2 refills | Status: DC

## 2020-09-06 NOTE — Patient Instructions (Signed)
Continue prescribed medications Fall precautions at home, walk with cane We will call you with thyroid lab results Continue to push fluids, especially water ([facility]) Follow-up August 16th, 2022 at 9:00 with Dr Tobie Poet or sooner if needed  Fall Prevention in the Home, Adult Falls can cause injuries and can affect people from all age groups. There are many simple things that you can do to make your home safe and to help prevent falls. Ask for help when making these changes, if needed. What actions can I take to prevent falls? General instructions  Use good lighting in all rooms. Replace any light bulbs that burn out.  Turn on lights if it is dark. Use night-lights.  Place frequently used items in easy-to-reach places. Lower the shelves around your home if necessary.  Set up furniture so that there are clear paths around it. Avoid moving your furniture around.  Remove throw rugs and other tripping hazards from the floor.  Avoid walking on wet floors.  Fix any uneven floor surfaces.  Add color or contrast paint or tape to grab bars and handrails in your home. Place contrasting color strips on the first and last steps of stairways.  When you use a stepladder, make sure that it is completely opened and that the sides are firmly locked. Have someone hold the ladder while you are using it. Do not climb a closed stepladder.  Be aware of any and all pets. What can I do in the bathroom?  Keep the floor dry. Immediately clean up any water that spills onto the floor.  Remove soap buildup in the tub or shower on a regular basis.  Use non-skid mats or decals on the floor of the tub or shower.  Attach bath mats securely with double-sided, non-slip rug tape.  If you need to sit down while you are in the shower, use a plastic, non-slip stool.  Install grab bars by the toilet and in the tub and shower. Do not use towel bars as grab bars.      What can I do in the bedroom?  Make sure  that a bedside light is easy to reach.  Do not use oversized bedding that drapes onto the floor.  Have a firm chair that has side arms to use for getting dressed. What can I do in the kitchen?  Clean up any spills right away.  If you need to reach for something above you, use a sturdy step stool that has a grab bar.  Keep electrical cables out of the way.  Do not use floor polish or wax that makes floors slippery. If you must use wax, make sure that it is non-skid floor wax. What can I do in the stairways?  Do not leave any items on the stairs.  Make sure that you have a light switch at the top of the stairs and the bottom of the stairs. Have them installed if you do not have them.  Make sure that there are handrails on both sides of the stairs. Fix handrails that are broken or loose. Make sure that handrails are as long as the stairways.  Install non-slip stair treads on all stairs in your home.  Avoid having throw rugs at the top or bottom of stairways, or secure the rugs with carpet tape to prevent them from moving.  Choose a carpet design that does not hide the edge of steps on the stairway.  Check any carpeting to make sure that it is firmly  attached to the stairs. Fix any carpet that is loose or worn. What can I do on the outside of my home?  Use bright outdoor lighting.  Regularly repair the edges of walkways and driveways and fix any cracks.  Remove high doorway thresholds.  Trim any shrubbery on the main path into your home.  Regularly check that handrails are securely fastened and in good repair. Both sides of any steps should have handrails.  Install guardrails along the edges of any raised decks or porches.  Clear walkways of debris and clutter, including tools and rocks.  Have leaves, snow, and ice cleared regularly.  Use sand or salt on walkways during winter months.  In the garage, clean up any spills right away, including grease or oil spills. What  other actions can I take?  Wear closed-toe shoes that fit well and support your feet. Wear shoes that have rubber soles or low heels.  Use mobility aids as needed, such as canes, walkers, scooters, and crutches.  Review your medicines with your health care provider. Some medicines can cause dizziness or changes in blood pressure, which increase your risk of falling. Talk with your health care provider about other ways that you can decrease your risk of falls. This may include working with a physical therapist or trainer to improve your strength, balance, and endurance. Where to find more information  Centers for Disease Control and Prevention, [employer]: WebmailGuide.co.za  [employer] on Aging: BrainJudge.co.uk Contact a health care provider if:  You are afraid of falling at home.  You feel weak, drowsy, or dizzy at home.  You fall at home. Summary  There are many simple things that you can do to make your home safe and to help prevent falls.  Ways to make your home safe include removing tripping hazards and installing grab bars in the bathroom.  Ask for help when making these changes in your home. This information is not intended to replace advice given to you by your health care provider. Make sure you discuss any questions you have with your health care provider. Document Revised: 03/05/2017 Document Reviewed: 11/05/2016 Elsevier Patient Education  2021 [place]. Hyperthyroidism  Hyperthyroidism is when the thyroid gland is too active (overactive). The thyroid gland is a small gland located in the lower front part of the neck, just in front of the windpipe (trachea). This gland makes hormones that help control how the body uses food for energy (metabolism) as well as how the heart and brain function. These hormones also play a role in keeping your bones strong. When the thyroid is overactive, it produces too much of a hormone called thyroxine. What are the  causes? This condition may be caused by:  Graves' disease. This is a disorder in which the body's disease-fighting system (immune system) attacks the thyroid gland. This is the most common cause.  Inflammation of the thyroid gland.  A tumor in the thyroid gland.  Use of certain medicines, including: ? Prescription thyroid hormone replacement. ? Herbal supplements that mimic thyroid hormones. ? Amiodarone therapy.  Solid or fluid-filled lumps within your thyroid gland (thyroid nodules).  Taking in a large amount of iodine from foods or medicines. What increases the risk? You are more likely to develop this condition if:  You are female.  You have a family history of thyroid conditions.  You smoke tobacco.  You use a medicine called lithium.  You take medicines that affect the immune system (immunosuppressants). What are the signs or  symptoms? Symptoms of this condition include:  Nervousness.  Inability to tolerate heat.  Unexplained weight loss.  Diarrhea.  Change in the texture of hair or skin.  Heart skipping beats or making extra beats.  Rapid heart rate.  Loss of menstruation.  Shaky hands.  Fatigue.  Restlessness.  Sleep problems.  Enlarged thyroid gland or a lump in the thyroid (nodule). You may also have symptoms of Graves' disease, which may include:  Protruding eyes.  Dry eyes.  Red or swollen eyes.  Problems with vision. How is this diagnosed? This condition may be diagnosed based on:  Your symptoms and medical history.  A physical exam.  Blood tests.  Thyroid ultrasound. This test involves using sound waves to produce images of the thyroid gland.  A thyroid scan. A radioactive substance is injected into a vein, and images show how much iodine is present in the thyroid.  Radioactive iodine uptake test (RAIU). A small amount of radioactive iodine is given by mouth to see how much iodine the thyroid absorbs after a certain amount  of time. How is this treated? Treatment depends on the cause and severity of the condition. Treatment may include:  Medicines to reduce the amount of thyroid hormone your body makes.  Radioactive iodine treatment (radioiodine therapy). This involves swallowing a small dose of radioactive iodine, in capsule or liquid form, to kill thyroid cells.  Surgery to remove part or all of your thyroid gland. You may need to take thyroid hormone replacement medicine for the rest of your life after thyroid surgery.  Medicines to help manage your symptoms. Follow these instructions at home:  Take over-the-counter and prescription medicines only as told by your health care provider.  Do not use any products that contain nicotine or tobacco, such as cigarettes and e-cigarettes. If you need help quitting, ask your health care provider.  Follow any instructions from your health care provider about diet. You may be instructed to limit foods that contain iodine.  Keep all follow-up visits as told by your health care provider. This is important. ? You will need to have blood tests regularly so that your health care provider can monitor your condition.   Contact a health care provider if:  Your symptoms do not get better with treatment.  You have a fever.  You are taking thyroid hormone replacement medicine and you: ? Have symptoms of depression. ? Feel like you are tired all the time. ? Gain weight. Get help right away if:  You have chest pain.  You have decreased alertness or a change in your awareness.  You have abdominal pain.  You feel dizzy.  You have a rapid heartbeat.  You have an irregular heartbeat.  You have difficulty breathing. Summary  The thyroid gland is a small gland located in the lower front part of the neck, just in front of the windpipe (trachea).  Hyperthyroidism is when the thyroid gland is too active (overactive) and produces too much of a hormone called  thyroxine.  The most common cause is Graves' disease, a disorder in which your immune system attacks the thyroid gland.  Hyperthyroidism can cause various symptoms, such as unexplained weight loss, nervousness, inability to tolerate heat, or changes in your heartbeat.  Treatment may include medicine to reduce the amount of thyroid hormone your body makes, radioiodine therapy, surgery, or medicines to manage symptoms. This information is not intended to replace advice given to you by your health care provider. Make sure you discuss any  questions you have with your health care provider. Document Revised: 12/07/2019 Document Reviewed: 12/07/2019 Elsevier Patient Education  2021 [employer].

## 2020-09-06 NOTE — Progress Notes (Signed)
Established Patient Office Visit  Subjective:  Patient ID: Donna Little, female    DOB: [birth year]  Age: 75 y.o. MRN: [id number]  CC:  Chief Complaint  Patient presents with  . Hospitalization Follow-up    [facility] hospital    HPI DEAJA Little presents for hospital follow-up.  Follow up Hospitalization  Patient was admitted to [facility] on 09/01/20 and discharged on 09/04/20 She was treated for encephalopathy related to UTI. Treatment for this included Rocephin antibiotics. She reports good compliance with treatment. She reports this condition is improved.   Past Medical History:  Diagnosis Date  . A-fib ([place])   . Atherosclerosis of both carotid arteries 2022   noted per CT on 05/14/20  . Chronic pain syndrome   . Chronic systolic heart failure ([place])   . Depression   . Emphysema of lung ([place]) 2022   noted per CT scan on 05/14/20  . Generalized atherosclerosis   . GERD (gastroesophageal reflux disease)   . Hypertension   . IBS (irritable bowel syndrome)   . Melanoma ([place])   . Other specified nontoxic goiter   . Palpitations   . Polymyalgia rheumatica ([place])   . Primary pulmonary hypertension ([place])   . Pulmonary embolism ([place])   . Stroke ([place])    ministroke  . Supraventricular tachycardia ([place])   . Type 2 diabetes mellitus ([place])     Past Surgical History:  Procedure Laterality Date  . ABDOMINAL HYSTERECTOMY    . APPENDECTOMY    . CATARACT EXTRACTION    . CORONARY ANGIOPLASTY WITH STENT PLACEMENT    . KYPHOPLASTY  05/2020   L4-L5    Family History  Problem Relation Age of Onset  . Diabetes Other   . Stroke Other   . Heart disease Other     Social History   Socioeconomic History  . Marital status: Single    Spouse name: Not on file  . Number of children: 2  . Years of education: Not on file  . Highest education level: Not on file  Occupational History  . Occupation: retired  Tobacco Use  . Smoking status: Light Tobacco Smoker     Years: 50.00  . Smokeless tobacco: Never Used  . Tobacco comment: smokes 1-2 cigarettes per week.  Substance and Sexual Activity  . Alcohol use: No    Alcohol/week: 0.0 standard drinks  . Drug use: No  . Sexual activity: Not on file  Other Topics Concern  . Not on file  Social History Narrative  . Not on file      Outpatient Medications Prior to Visit  Medication Sig Dispense Refill  . acetaminophen (TYLENOL) 325 MG tablet Take by mouth.    Marland Kitchen albuterol (VENTOLIN HFA) 108 (90 Base) MCG/ACT inhaler Inhale 2 puffs into the lungs every 6 (six) hours as needed.     . citalopram (CELEXA) 20 MG tablet Take 1 tablet (20 mg total) by mouth daily. 90 tablet 1  . clopidogrel (PLAVIX) 75 MG tablet Take 1 tablet (75 mg total) by mouth daily. 90 tablet 1  . diclofenac Sodium (VOLTAREN) 1 % GEL Apply 2 g topically 4 (four) times daily as needed.     . dicyclomine (BENTYL) 10 MG capsule TAKE 1 CAPSULE BY MOUTH 4 TIMES DAILY BEFORE MEAL(S) AND AT BEDTIME 360 capsule 1  . Fluticasone-Umeclidin-Vilant (TRELEGY ELLIPTA) 100-62.5-25 MCG/INH AEPB INHALE 1 PUFF ONCE DAILY USE AT THE SAME TIME EACH DAY 1 each 1  . furosemide (LASIX) 40  MG tablet Take 2 tablets by mouth twice daily 360 tablet 0  . gabapentin (NEURONTIN) 800 MG tablet Take 1 tablet (800 mg total) by mouth 3 (three) times daily. 270 tablet 1  . meclizine (ANTIVERT) 25 MG tablet Take 25 mg by mouth 3 (three) times daily as needed.     . mirtazapine (REMERON) 15 MG tablet Take 1 tablet (15 mg total) by mouth at bedtime. 30 tablet 3  . morphine (MS CONTIN) 30 MG 12 hr tablet Take 1 tablet (30 mg total) by mouth in the morning, at noon, and at bedtime. 90 tablet 0  . nitroGLYCERIN (NITROSTAT) 0.4 MG SL tablet Place under the tongue.    . ondansetron (ZOFRAN-ODT) 4 MG disintegrating tablet DISSOLVE 1 TO 2 TABLETS IN MOUTH EVERY 8 HOURS AS NEEDED FOR NAUSEA 40 tablet 0  . pantoprazole (PROTONIX) 40 MG tablet Take 1 tablet (40 mg total) by mouth  daily. 90 tablet 1  . Potassium Chloride ER 20 MEQ TBCR Take 1 tablet by mouth 2 (two) times daily with a meal. 180 tablet 1  . pravastatin (PRAVACHOL) 40 MG tablet Take 1 tablet (40 mg total) by mouth daily. 90 tablet 1  . metoprolol succinate (TOPROL-XL) 25 MG 24 hr tablet Take 1 tablet (25 mg total) by mouth in the morning and at bedtime. (Patient taking differently: Take 6.25 mg by mouth in the morning and at bedtime.) 180 tablet 1   No facility-administered medications prior to visit.    Allergies  Allergen Reactions  . Codeine   . Methocarbamol   . Sertraline     Insomnia   . Tizanidine     ROS Review of Systems  Constitutional: Negative for appetite change, fatigue and unexpected weight change.  HENT: Negative for congestion, ear pain, rhinorrhea, sinus pressure, sinus pain and tinnitus.   Eyes: Negative for pain.  Respiratory: Negative for cough and shortness of breath.   Cardiovascular: Negative for chest pain, palpitations and leg swelling.  Gastrointestinal: Negative for abdominal pain, constipation, diarrhea, nausea and vomiting.  Endocrine: Negative for cold intolerance, heat intolerance, polydipsia, polyphagia and polyuria.  Genitourinary: Negative for dysuria, frequency and hematuria.  Musculoskeletal: Positive for arthralgias, back pain and gait problem. Negative for joint swelling and myalgias.  Skin: Negative for rash.  Allergic/Immunologic: Negative for environmental allergies.  Neurological: Negative for dizziness and headaches.  Hematological: Negative for adenopathy. Bruises/bleeds easily.  Psychiatric/Behavioral: Negative for decreased concentration and sleep disturbance. The patient is not nervous/anxious.       Objective:    Physical Exam Constitutional:      Appearance: Normal appearance.  HENT:     Head: Normocephalic.     Mouth/Throat:     Mouth: Mucous membranes are dry.  Cardiovascular:     Rate and Rhythm: Regular rhythm. Tachycardia  present.     Pulses: Normal pulses.     Heart sounds: Normal heart sounds.  Pulmonary:     Effort: Pulmonary effort is normal.     Breath sounds: Normal breath sounds.     Comments: Diminished breath sounds bilateral posterior lobes Abdominal:     General: Bowel sounds are normal.     Palpations: Abdomen is soft.  Musculoskeletal:        General: Tenderness present.     Thoracic back: Tenderness and bony tenderness present. Decreased range of motion.     Lumbar back: Tenderness and bony tenderness present. Decreased range of motion.  Skin:    General: Skin is warm and  dry.     Capillary Refill: Capillary refill takes less than 2 seconds.     Findings: Bruising present.  Neurological:     General: No focal deficit present.     Mental Status: She is alert and oriented to person, place, and time.  Psychiatric:        Mood and Affect: Mood normal.        Behavior: Behavior normal.     BP (!) 98/52   Pulse (!) 102   Temp 97.6 F (36.4 C) (Temporal)   Ht $R'5\' 3"'MM$  (1.6 m)   Wt 126 lb (57.2 kg)   SpO2 92%   BMI 22.32 kg/m   SpO2 92%   BMI 22.32 kg/m  Wt Readings from Last 3 Encounters:  09/06/20 126 lb (57.2 kg)  08/12/20 129 lb 12.8 oz (58.9 kg)  07/29/20 130 lb 6.4 oz (59.1 kg)     Health Maintenance Due  Topic Date Due  . Pneumococcal Vaccine 18-89 Years old (1 of 4 - PCV13) Never done  . OPHTHALMOLOGY EXAM  Never done  . Hepatitis C Screening  Never done  . TETANUS/TDAP  Never done     Lab Results  Component Value Date   TSH 1.050 12/26/2019   Lab Results  Component Value Date   WBC 9.4 07/29/2020   HGB 14.3 07/29/2020   HCT 42.1 07/29/2020   MCV 97 07/29/2020   PLT 227 07/29/2020   Lab Results  Component Value Date   NA 143 07/29/2020   K 4.6 07/29/2020   CO2 29 07/29/2020   GLUCOSE 170 (H) 07/29/2020   BUN 27 07/29/2020   CREATININE 0.97 07/29/2020   BILITOT 0.2 07/29/2020   ALKPHOS 80 07/29/2020   AST 23 07/29/2020   ALT 17 07/29/2020   PROT  6.8 07/29/2020   ALBUMIN 4.2 07/29/2020   CALCIUM 9.2 07/29/2020   EGFR 61 07/29/2020   Lab Results  Component Value Date   CHOL 141 06/27/2020   Lab Results  Component Value Date   HDL 40 06/27/2020   Lab Results  Component Value Date   LDLCALC 88 06/27/2020   Lab Results  Component Value Date   TRIG 64 06/27/2020   Lab Results  Component Value Date   CHOLHDL 3.5 06/27/2020   Lab Results  Component Value Date   HGBA1C 5.5 06/27/2020      Assessment & Plan:   1. Acute cystitis with hematuria -Continue Ceftin as prescribed  2. Hyperthyroidism - Thyroid Panel With TSH  3. Tachycardia - Thyroid Panel With TSH  4. Chronic pain syndrome -Continue MS Contin as prescribed  5. Hypotension due to drugs -Push fluids, especially water  6. Cigarette nicotine dependence with other nicotine-induced disorder -Smoking cessation encouraged   Continue prescribed medications Fall precautions at home, walk with cane We will call you with thyroid lab results Continue to push fluids, especially water ([facility]) Follow-up August 16th, 2022 at 9:00 with Dr Tobie Poet or sooner if needed  I, Rip Harbour, NP, have reviewed all documentation for this visit. The documentation on 09/08/20 for the exam, diagnosis, procedures, and orders are all accurate and complete.   Follow-up: August 16th, 2022 at 9:00 with Dr Tobie Poet   Signed, Rip Harbour, NP

## 2020-09-06 NOTE — Progress Notes (Deleted)
Subjective:  Patient ID: Donna Little, female    DOB: 28-Apr-1945  Age: 75 y.o. MRN: [id number]  Chief Complaint  Patient presents with  . Hospitalization Follow-up    [facility]    HPI   Follow up Hospitalization  Patient was admitted to *** on *** and discharged on ***. She was treated for ***. Treatment for this included ***. Telephone follow up was done on *** She reports {excellent/good/fair:19992} compliance with treatment. She reports this condition is {resolved/improved/worsened:23923}.   Current Outpatient Medications on File Prior to Visit  Medication Sig Dispense Refill  . acetaminophen (TYLENOL) 325 MG tablet Take by mouth.    Marland Kitchen albuterol (VENTOLIN HFA) 108 (90 Base) MCG/ACT inhaler Inhale 2 puffs into the lungs every 6 (six) hours as needed.     . citalopram (CELEXA) 20 MG tablet Take 1 tablet (20 mg total) by mouth daily. 90 tablet 1  . clopidogrel (PLAVIX) 75 MG tablet Take 1 tablet (75 mg total) by mouth daily. 90 tablet 1  . diclofenac Sodium (VOLTAREN) 1 % GEL Apply 2 g topically 4 (four) times daily as needed.     . dicyclomine (BENTYL) 10 MG capsule TAKE 1 CAPSULE BY MOUTH 4 TIMES DAILY BEFORE MEAL(S) AND AT BEDTIME 360 capsule 1  . Fluticasone-Umeclidin-Vilant (TRELEGY ELLIPTA) 100-62.5-25 MCG/INH AEPB INHALE 1 PUFF ONCE DAILY USE AT THE SAME TIME EACH DAY 1 each 1  . furosemide (LASIX) 40 MG tablet Take 2 tablets by mouth twice daily 360 tablet 0  . gabapentin (NEURONTIN) 800 MG tablet Take 1 tablet (800 mg total) by mouth 3 (three) times daily. 270 tablet 1  . meclizine (ANTIVERT) 25 MG tablet Take 25 mg by mouth 3 (three) times daily as needed.     . mirtazapine (REMERON) 15 MG tablet Take 1 tablet (15 mg total) by mouth at bedtime. 30 tablet 3  . morphine (MS CONTIN) 30 MG 12 hr tablet Take 1 tablet (30 mg total) by mouth in the morning, at noon, and at bedtime. 90 tablet 0  . nitroGLYCERIN (NITROSTAT) 0.4 MG SL tablet Place under the tongue.    .  ondansetron (ZOFRAN-ODT) 4 MG disintegrating tablet DISSOLVE 1 TO 2 TABLETS IN MOUTH EVERY 8 HOURS AS NEEDED FOR NAUSEA 40 tablet 0  . pantoprazole (PROTONIX) 40 MG tablet Take 1 tablet (40 mg total) by mouth daily. 90 tablet 1  . Potassium Chloride ER 20 MEQ TBCR Take 1 tablet by mouth 2 (two) times daily with a meal. 180 tablet 1  . pravastatin (PRAVACHOL) 40 MG tablet Take 1 tablet (40 mg total) by mouth daily. 90 tablet 1   No current facility-administered medications on file prior to visit.   Past Medical History:  Diagnosis Date  . A-fib ([place])   . Atherosclerosis of both carotid arteries 2022   noted per CT on 05/14/20  . Chronic pain syndrome   . Chronic systolic heart failure ([place])   . Depression   . Emphysema of lung ([place]) 2022   noted per CT scan on 05/14/20  . Generalized atherosclerosis   . GERD (gastroesophageal reflux disease)   . Hypertension   . IBS (irritable bowel syndrome)   . Melanoma ([place])   . Other specified nontoxic goiter   . Palpitations   . Polymyalgia rheumatica ([place])   . Primary pulmonary hypertension ([place])   . Pulmonary embolism ([place])   . Stroke ([place])    ministroke  . Supraventricular tachycardia ([place])   . Type  2 diabetes mellitus ([place])    Past Surgical History:  Procedure Laterality Date  . ABDOMINAL HYSTERECTOMY    . APPENDECTOMY    . CATARACT EXTRACTION    . CORONARY ANGIOPLASTY WITH STENT PLACEMENT    . KYPHOPLASTY  05/2020   L4-L5    Family History  Problem Relation Age of Onset  . Diabetes Other   . Stroke Other   . Heart disease Other    Social History   Socioeconomic History  . Marital status: Single    Spouse name: Not on file  . Number of children: 2  . Years of education: Not on file  . Highest education level: Not on file  Occupational History  . Occupation: retired  Tobacco Use  . Smoking status: Light Tobacco Smoker    Years: 50.00  . Smokeless tobacco: Never Used  . Tobacco comment: smokes 1-2 cigarettes per  week.  Substance and Sexual Activity  . Alcohol use: No    Alcohol/week: 0.0 standard drinks  . Drug use: No  . Sexual activity: Not on file  Other Topics Concern  . Not on file  Social History Narrative  . Not on file   Social Determinants of Health   Financial Resource Strain: Not on file  Food Insecurity: No Food Insecurity  . Worried About Charity fundraiser in the Last Year: Never true  . Ran Out of Food in the Last Year: Never true  Transportation Needs: Not on file  Physical Activity: Not on file  Stress: Not on file  Social Connections: Not on file    Review of Systems  Constitutional: Negative for fatigue and fever.  HENT: Negative for congestion, ear pain, sinus pressure and sore throat.   Eyes: Negative for pain.  Respiratory: Negative for cough, chest tightness, shortness of breath and wheezing.   Cardiovascular: Negative for chest pain and palpitations.  Gastrointestinal: Negative for abdominal pain, constipation, diarrhea, nausea and vomiting.  Genitourinary: Negative for dysuria and hematuria.  Musculoskeletal: Positive for back pain (chronic). Negative for arthralgias, joint swelling and myalgias.  Skin: Negative for rash.  Neurological: Negative for dizziness, weakness and headaches.  Psychiatric/Behavioral: Negative for dysphoric mood. The patient is not nervous/anxious.      Objective:  BP (!) 88/48 (BP Location: Left Arm, Patient Position: Sitting)   Pulse (!) 102   Temp 97.6 F (36.4 C) (Temporal)   Ht 5\' 3"  (1.6 m)   Wt 126 lb (57.2 kg)   SpO2 92%   BMI 22.32 kg/m   BP/Weight 09/06/2020 08/12/2020 4/85/4627  Systolic BP 88 035 78  Diastolic BP 48 70 40  Wt. (Lbs) 126 129.8 130.4  BMI 22.32 22.99 23.1    Physical Exam Vitals reviewed.  Constitutional:      Appearance: Normal appearance.  HENT:     Right Ear: Tympanic membrane, ear canal and external ear normal.     Left Ear: Tympanic membrane, ear canal and external ear normal.      Nose: Nose normal.     Mouth/Throat:     Mouth: Mucous membranes are moist.  Cardiovascular:     Rate and Rhythm: Normal rate and regular rhythm.     Pulses: Normal pulses.     Heart sounds: Normal heart sounds.  Pulmonary:     Effort: Pulmonary effort is normal.     Breath sounds: Normal breath sounds.  Abdominal:     Palpations: Abdomen is soft.  Musculoskeletal:  General: Normal range of motion.     Cervical back: Normal range of motion.  Skin:    General: Skin is warm and dry.  Neurological:     Mental Status: She is alert and oriented to person, place, and time.  Psychiatric:        Mood and Affect: Mood normal.        Behavior: Behavior normal.        Thought Content: Thought content normal.        Judgment: Judgment normal.     Diabetic Foot Exam - Simple   No data filed      Lab Results  Component Value Date   WBC 9.4 07/29/2020   HGB 14.3 07/29/2020   HCT 42.1 07/29/2020   PLT 227 07/29/2020   GLUCOSE 170 (H) 07/29/2020   CHOL 141 06/27/2020   TRIG 64 06/27/2020   HDL 40 06/27/2020   LDLCALC 88 06/27/2020   ALT 17 07/29/2020   AST 23 07/29/2020   NA 143 07/29/2020   K 4.6 07/29/2020   CL 97 07/29/2020   CREATININE 0.97 07/29/2020   BUN 27 07/29/2020   CO2 29 07/29/2020   TSH 1.050 12/26/2019   HGBA1C 5.5 06/27/2020   MICROALBUR 30 01/20/2020      Assessment & Plan:   There are no diagnoses linked to this encounter.   No orders of the defined types were placed in this encounter.   No orders of the defined types were placed in this encounter.    Follow-up: No follow-ups on file.  An After Visit Summary was printed and given to the patient.   I , Fonnie Mu as a scribe for Rip Harbour, NP.,have documented all relevant documentation on the behalf of Rip Harbour, NP,as directed by  Rip Harbour, NP while in the presence of Rip Harbour, NP.  Rip Harbour, NP [place] [phone]

## 2020-09-07 LAB — THYROID PANEL WITH TSH
Free Thyroxine Index: 2.4 (ref 1.2–4.9)
T3 Uptake Ratio: 27 % (ref 24–39)
T4, Total: 9 ug/dL (ref 4.5–12.0)
TSH: 0.75 u[IU]/mL (ref 0.450–4.500)

## 2020-09-09 ENCOUNTER — Telehealth: Payer: Medicare Other

## 2020-09-09 DIAGNOSIS — Z79899 Other long term (current) drug therapy: Secondary | ICD-10-CM | POA: Diagnosis not present

## 2020-09-09 DIAGNOSIS — Z792 Long term (current) use of antibiotics: Secondary | ICD-10-CM | POA: Diagnosis not present

## 2020-09-09 DIAGNOSIS — F419 Anxiety disorder, unspecified: Secondary | ICD-10-CM | POA: Diagnosis not present

## 2020-09-09 DIAGNOSIS — Z85828 Personal history of other malignant neoplasm of skin: Secondary | ICD-10-CM | POA: Diagnosis not present

## 2020-09-09 DIAGNOSIS — J42 Unspecified chronic bronchitis: Secondary | ICD-10-CM | POA: Diagnosis not present

## 2020-09-09 DIAGNOSIS — K529 Noninfective gastroenteritis and colitis, unspecified: Secondary | ICD-10-CM | POA: Diagnosis not present

## 2020-09-09 DIAGNOSIS — R2681 Unsteadiness on feet: Secondary | ICD-10-CM | POA: Diagnosis not present

## 2020-09-09 DIAGNOSIS — Z7902 Long term (current) use of antithrombotics/antiplatelets: Secondary | ICD-10-CM | POA: Diagnosis not present

## 2020-09-09 DIAGNOSIS — M1990 Unspecified osteoarthritis, unspecified site: Secondary | ICD-10-CM | POA: Diagnosis not present

## 2020-09-09 DIAGNOSIS — F17210 Nicotine dependence, cigarettes, uncomplicated: Secondary | ICD-10-CM | POA: Diagnosis not present

## 2020-09-09 DIAGNOSIS — I2510 Atherosclerotic heart disease of native coronary artery without angina pectoris: Secondary | ICD-10-CM | POA: Diagnosis not present

## 2020-09-09 DIAGNOSIS — G8929 Other chronic pain: Secondary | ICD-10-CM | POA: Diagnosis not present

## 2020-09-09 DIAGNOSIS — I252 Old myocardial infarction: Secondary | ICD-10-CM | POA: Diagnosis not present

## 2020-09-09 DIAGNOSIS — D649 Anemia, unspecified: Secondary | ICD-10-CM | POA: Diagnosis not present

## 2020-09-09 DIAGNOSIS — N390 Urinary tract infection, site not specified: Secondary | ICD-10-CM | POA: Diagnosis not present

## 2020-09-09 DIAGNOSIS — F32A Depression, unspecified: Secondary | ICD-10-CM | POA: Diagnosis not present

## 2020-09-09 DIAGNOSIS — I10 Essential (primary) hypertension: Secondary | ICD-10-CM | POA: Diagnosis not present

## 2020-09-10 ENCOUNTER — Telehealth: Payer: Self-pay

## 2020-09-10 DIAGNOSIS — F17210 Nicotine dependence, cigarettes, uncomplicated: Secondary | ICD-10-CM | POA: Diagnosis not present

## 2020-09-10 DIAGNOSIS — N390 Urinary tract infection, site not specified: Secondary | ICD-10-CM | POA: Diagnosis not present

## 2020-09-10 DIAGNOSIS — D649 Anemia, unspecified: Secondary | ICD-10-CM | POA: Diagnosis not present

## 2020-09-10 DIAGNOSIS — I2510 Atherosclerotic heart disease of native coronary artery without angina pectoris: Secondary | ICD-10-CM | POA: Diagnosis not present

## 2020-09-10 DIAGNOSIS — F32A Depression, unspecified: Secondary | ICD-10-CM | POA: Diagnosis not present

## 2020-09-10 DIAGNOSIS — F419 Anxiety disorder, unspecified: Secondary | ICD-10-CM | POA: Diagnosis not present

## 2020-09-10 NOTE — Telephone Encounter (Signed)
Agree  kc

## 2020-09-10 NOTE — Telephone Encounter (Signed)
Lannette Donath, nurse w/ [facility] calling requesting verbal orders for nursing schedule. Schedule once a week for three weeks starting next week. PT will do their visit tomorrow and report. Nurse also needing to report major drug interactions. Plavix w/ morphine and protonix. Did not know if any changes need to be made. Gave verbal orders for nursing.   Royce Macadamia, [place] 09/10/20 2:13 PM

## 2020-09-12 NOTE — Telephone Encounter (Signed)
Donna Little, PT w/ Center calling requesting verbal orders. Schedule once a week for four weeks. Gave verbal orders.   Royce Macadamia, [place] 09/12/20 8:56 AM

## 2020-09-13 ENCOUNTER — Telehealth: Payer: Self-pay

## 2020-09-13 ENCOUNTER — Other Ambulatory Visit: Payer: Self-pay

## 2020-09-13 NOTE — Progress Notes (Signed)
Chronic Care Management Pharmacy Assistant   Name: Donna Little  MRN: [id number] DOB: [birth year]   Reason for Encounter: for hospital follow up   Recent office visits 09/06/20-Shannon Heaton NP, hospital follow up, Labs ordered, Metoprolol- patient is taking 6.25mg  BID Patient was admitted to [facility] on 09/01/20 and discharged on 09/04/20 She was treated for encephalopathy related to UTI. Treatment for this included Rocephin antibiotics. She reports good compliance with treatment. She reports this condition is improved. Thyroid panel trending to normal, nor referral to endocrinology at this time Continue prescribed medications Fall precautions at home, walk with cane We will call you with thyroid lab results Continue to push fluids, especially water ([place]) Follow-up August 16th, 2022 at 9:00 with Dr Tobie Poet or sooner if needed   08/12/2020 - metoprolol xl 25 mg 1/4 tablet twice daily. Refer to ENT for hoarseness. Pain management contract. Avoid cords on floor to avoid falls.  ?   07/29/2020: [name], MD (PCP) /?Discontinued Ciprofloxacin 250 mg. (completed course)   Discontinued Nitrofurantoin 100 mg. (completed course) ?   07/04/2020: [name] (PCP) / ?Added Mirtazapine 15mg . Tablet po qhs at bedtime. Added Prednisone 10 mg. Tablet po qam. Ordered MRI brain w/ and w/o contrast. ?   05/30/2020: [name], MD (PCP) for postconcussion syndrome / Increased Pravastatin from 20mg  to 40 mg., take 1 po qd ?   05/16/2020: [name], NP (PCP) for subsequent falls/ confusion/ CT head w/o contrast ordered/ D/C IA TAC 40mg . (completed course) 1x injection for acute (R) shoulder pain   Recent consult visits:  08/29/2020 - Orthopedics and Sports Medicine - ?follow-up from surgery. Pain continues to improve. Follow-up in 4 months. Continue to consider aquatic PT.     08/28/2020 - Cardiology - carotid duplex to evaluate right carotid bruit. Monitor blood  pressure for 2 weeks. Recommend LDL <70.     07/05/2020: Eileen Stanford McDaniels, PA ( Orthopaedics) / Post-op visit: 2 months s/p Spinal Fusion and Kyphoplasty (DOS: 05/08/2020)? ?   05/09/2020: [name], MD (Neurosurgery) @ [facility] / No medication changes noted  ?   05/01/2020: [name], MD (Cardiology) @ [facility] for SVT / Ordered Pravastatin 40 mg. Tablet, take 1 po qd.    Hospital visits:  09/02/2020 - 09/04/2020 - Hospital for UTI and Colitis. Discharged on Ceftin and probiotic.    07/30/2020-07/31/2020 - hospital for chest pain.    05/14/2020: [facility] ED for abd injury, and multiple fx's?      Medications: Outpatient Encounter Medications as of 09/13/2020  Medication Sig   acetaminophen (TYLENOL) 325 MG tablet Take by mouth.   albuterol (VENTOLIN HFA) 108 (90 Base) MCG/ACT inhaler Inhale 2 puffs into the lungs every 6 (six) hours as needed.    citalopram (CELEXA) 20 MG tablet Take 1 tablet (20 mg total) by mouth daily.   clopidogrel (PLAVIX) 75 MG tablet Take 1 tablet (75 mg total) by mouth daily.   diclofenac Sodium (VOLTAREN) 1 % GEL Apply 2 g topically 4 (four) times daily as needed.    dicyclomine (BENTYL) 10 MG capsule TAKE 1 CAPSULE BY MOUTH 4 TIMES DAILY BEFORE MEAL(S) AND AT BEDTIME   Fluticasone-Umeclidin-Vilant (TRELEGY ELLIPTA) 100-62.5-25 MCG/INH AEPB INHALE 1 PUFF ONCE DAILY USE AT THE SAME TIME EACH DAY   furosemide (LASIX) 40 MG tablet Take 2 tablets by mouth twice daily   gabapentin (NEURONTIN) 800 MG tablet Take 1 tablet (800 mg total) by mouth 3 (three) times daily.  meclizine (ANTIVERT) 25 MG tablet Take 25 mg by mouth 3 (three) times daily as needed.    mirtazapine (REMERON) 15 MG tablet Take 1 tablet (15 mg total) by mouth at bedtime.   morphine (MS CONTIN) 30 MG 12 hr tablet Take 1 tablet (30 mg total) by mouth in the morning, at noon, and at bedtime.   nitroGLYCERIN (NITROSTAT) 0.4 MG SL tablet Place under the tongue.   ondansetron (ZOFRAN-ODT) 4  MG disintegrating tablet DISSOLVE 1 TO 2 TABLETS IN MOUTH EVERY 8 HOURS AS NEEDED FOR NAUSEA   pantoprazole (PROTONIX) 40 MG tablet Take 1 tablet (40 mg total) by mouth daily.   Potassium Chloride ER 20 MEQ TBCR Take 1 tablet by mouth 2 (two) times daily with a meal.   pramipexole (MIRAPEX) 0.25 MG tablet Take 1 tablet (0.25 mg total) by mouth at bedtime.   pravastatin (PRAVACHOL) 40 MG tablet Take 1 tablet (40 mg total) by mouth daily.   No facility-administered encounter medications on file as of 09/13/2020.    Spoke to patient daughter Sharyn Lull, she said her mom was doing ok.  She stated the Rocephin made her itch, so doctor stopped it.   Sharyn Lull stated she gets her medication from mail order all but her Morphine.  She was not sure on last fill dates.   Sharyn Lull stated that when the patient went to see her back surgeon for follow up, he wants her moving more and mentioned she could do water exercises, she would like Dr. Tobie Poet to also go over this with her mom.  She knows she is a fall risk, but really thinks moving more would help her.  Sharyn Lull said her blood pressures have been good, but did not have any specific numbers to give me.  Sharyn Lull stated her moms appetite is about the same, she doesn't eat much, they are trying to increase fluids as much as they can.  She stated she is not sure how much this will improve.    Care Gaps: Foot exam: 01/16/20 Eye exam: not showing AWV: not scheduled   [facility]: gets from mail order, did not see card in chart to call and verify fill dates  Clopidogrel Furosemide Pravastatin Metoprolol   Clarita Leber, [place] Pharmacist Assistant [phone]

## 2020-09-14 MED ORDER — MORPHINE SULFATE ER 30 MG PO TBCR: 30.0000 mg | EXTENDED_RELEASE_TABLET | Freq: Three times a day (TID) | ORAL | 0 refills | Status: DC

## 2020-09-16 ENCOUNTER — Encounter: Payer: Self-pay | Admitting: Nurse Practitioner

## 2020-09-16 ENCOUNTER — Ambulatory Visit (INDEPENDENT_AMBULATORY_CARE_PROVIDER_SITE_OTHER): Payer: Medicare Other | Admitting: Nurse Practitioner

## 2020-09-16 ENCOUNTER — Telehealth: Payer: Self-pay | Admitting: Family Medicine

## 2020-09-16 ENCOUNTER — Other Ambulatory Visit: Payer: Self-pay

## 2020-09-16 VITALS — BP 92/58 | HR 81 | Temp 97.3°F | Ht 63.0 in | Wt 121.0 lb

## 2020-09-16 DIAGNOSIS — N3289 Other specified disorders of bladder: Secondary | ICD-10-CM

## 2020-09-16 DIAGNOSIS — N3000 Acute cystitis without hematuria: Secondary | ICD-10-CM

## 2020-09-16 DIAGNOSIS — R197 Diarrhea, unspecified: Secondary | ICD-10-CM | POA: Diagnosis not present

## 2020-09-16 DIAGNOSIS — R110 Nausea: Secondary | ICD-10-CM | POA: Diagnosis not present

## 2020-09-16 DIAGNOSIS — R1031 Right lower quadrant pain: Secondary | ICD-10-CM

## 2020-09-16 DIAGNOSIS — R630 Anorexia: Secondary | ICD-10-CM

## 2020-09-16 LAB — POCT URINALYSIS DIPSTICK
Bilirubin, UA: NEGATIVE
Blood, UA: NEGATIVE
Glucose, UA: NEGATIVE
Ketones, UA: NEGATIVE
Nitrite, UA: NEGATIVE
Protein, UA: NEGATIVE
Spec Grav, UA: 1.01 (ref 1.010–1.025)
Urobilinogen, UA: NEGATIVE E.U./dL — AB
pH, UA: 8 (ref 5.0–8.0)

## 2020-09-16 MED ORDER — SULFAMETHOXAZOLE-TRIMETHOPRIM 800-160 MG PO TABS: 1.0000 | ORAL_TABLET | Freq: Two times a day (BID) | ORAL | 0 refills | Status: DC

## 2020-09-16 MED ORDER — ONDANSETRON 4 MG PO TBDP: ORAL_TABLET | ORAL | 0 refills | Status: DC

## 2020-09-16 MED ORDER — PHENAZOPYRIDINE HCL 200 MG PO TABS: 200.0000 mg | ORAL_TABLET | Freq: Three times a day (TID) | ORAL | 0 refills | Status: DC | PRN

## 2020-09-16 NOTE — Patient Instructions (Signed)
Push fluids, especially water Use Dove soap for showers We will call you with appt for Ct of abdomen If symptoms become severe, go to nearest emergency room Follow-up if symptoms worsen or fail to improve  Urinary Tract Infection, Adult A urinary tract infection (UTI) is an infection of any part of the urinary tract. The urinary tract includes: The kidneys. The ureters. The bladder. The urethra. These organs make, store, and get rid of pee (urine) in the body. What are the causes? This infection is caused by germs (bacteria) in your genital area. These germs grow and cause swelling (inflammation) of your urinary tract. What increases the risk? The following factors may make you more likely to develop this condition: Using a small, thin tube (catheter) to drain pee. Not being able to control when you pee or poop (incontinence). Being female. If you are female, these things can increase the risk: Using these methods to prevent pregnancy: A medicine that kills sperm (spermicide). A device that blocks sperm (diaphragm). Having low levels of a female hormone (estrogen). Being pregnant. You are more likely to develop this condition if: You have genes that add to your risk. You are sexually active. You take antibiotic medicines. You have trouble peeing because of: A prostate that is bigger than normal, if you are female. A blockage in the part of your body that drains pee from the bladder. A kidney stone. A nerve condition that affects your bladder. Not getting enough to drink. Not peeing often enough. You have other conditions, such as: Diabetes. A weak disease-fighting system (immune system). Sickle cell disease. Gout. Injury of the spine. What are the signs or symptoms? Symptoms of this condition include: Needing to pee right away. Peeing small amounts often. Pain or burning when peeing. Blood in the pee. Pee that smells bad or not like normal. Trouble peeing. Pee that is  cloudy. Fluid coming from the vagina, if you are female. Pain in the belly or lower back. Other symptoms include: Vomiting. Not feeling hungry. Feeling mixed up (confused). This may be the first symptom in older adults. Being tired and grouchy (irritable). A fever. Watery poop (diarrhea). How is this treated? Taking antibiotic medicine. Taking other medicines. Drinking enough water. In some cases, you may need to see a specialist. Follow these instructions at home:  Medicines Take over-the-counter and prescription medicines only as told by your doctor. If you were prescribed an antibiotic medicine, take it as told by your doctor. Do not stop taking it even if you start to feel better. General instructions Make sure you: Pee until your bladder is empty. Do not hold pee for a long time. Empty your bladder after sex. Wipe from front to back after peeing or pooping if you are a female. Use each tissue one time when you wipe. Drink enough fluid to keep your pee pale yellow. Keep all follow-up visits. Contact a doctor if: You do not get better after 1-2 days. Your symptoms go away and then come back. Get help right away if: You have very bad back pain. You have very bad pain in your lower belly. You have a fever. You have chills. You feeling like you will vomit or you vomit. Summary A urinary tract infection (UTI) is an infection of any part of the urinary tract. This condition is caused by germs in your genital area. There are many risk factors for a UTI. Treatment includes antibiotic medicines. Drink enough fluid to keep your pee pale yellow. This information  is not intended to replace advice given to you by your health care provider. Make sure you discuss any questions you have with your healthcare provider. Document Revised: 11/03/2019 Document Reviewed: 11/03/2019 Elsevier Patient Education  [place].

## 2020-09-16 NOTE — Telephone Encounter (Signed)
   Donna Little has been scheduled for the following appointment:  WHAT: CT of Abdomen and Pelvis WHERE: [place]: 09/18/20 TIME: 1:00 pm arrival time  Patient has been made aware. Pick up prep kit by 4:30 on 09/17/20, no solid foods after 11:00 am on 09/18/20.

## 2020-09-16 NOTE — Progress Notes (Signed)
Acute Office Visit  Subjective:    Patient ID: Donna Little, female    DOB: 1945-10-14, 75 y.o.   MRN: [id number]  Chief Complaint  Patient presents with   Right lower quadrant pain        HPI Patient is in today for RLQ abd pain, nausea, diarrhea, and headache. She states she has experienced decreased appetite and poor po intake. Denies fever. She is accompanied by her sister-in-law that drove her today's appointment.She has a past surgical history of appendectomy and hysterectomy.   Abdominal Pain  She reports new onset abdominal pain. The most recent episode started a few days ago and is rapidly worsening. The abdominal pain is located in the right lower quadrant with radiation to the right side. It is described as sharp, is severe 9/10, in intensity, occurring intermittently. It is aggravated by nothing and is relieved by nothing. She has tried  morphine with mild relief.  Associated symptoms: Yes anorexia  Yes belching  No bloody stool No blood in urine   No constipation Yes diarrhea  No dysuria No fever  Yes flatus Yes headaches  Yes headaches No joint pains  No myalgias Yes nausea  No vomiting Yes weight loss  Pt lost 5 pounds since last visit on 09/06/20. States she typically has constipation due to chronic pain management.    Recent GI studies: none Relevant medical history includes: appendectomy at age 49  Previous labs Lab Results  Component Value Date   WBC 9.4 07/29/2020   HGB 14.3 07/29/2020   HCT 42.1 07/29/2020   MCV 97 07/29/2020   MCH 33.0 07/29/2020   RDW 13.3 07/29/2020   PLT 227 07/29/2020   Lab Results  Component Value Date   GLUCOSE 170 (H) 07/29/2020   NA 143 07/29/2020   K 4.6 07/29/2020   CL 97 07/29/2020   CO2 29 07/29/2020   BUN 27 07/29/2020   CREATININE 0.97 07/29/2020   GFRNONAA 75 01/16/2020   GFRAA 87 01/16/2020   CALCIUM 9.2 07/29/2020   PROT 6.8 07/29/2020   ALBUMIN 4.2 07/29/2020   LABGLOB 2.6 07/29/2020   AGRATIO 1.6  07/29/2020   BILITOT 0.2 07/29/2020   ALKPHOS 80 07/29/2020   AST 23 07/29/2020   ALT 17 07/29/2020      Past Medical History:  Diagnosis Date   A-fib [facility])    Atherosclerosis of both carotid arteries 2022   noted per CT on 05/14/20   Chronic pain syndrome    Chronic systolic heart failure (HCC)    Depression    Emphysema of lung ([place]) 2022   noted per CT scan on 05/14/20   Generalized atherosclerosis    GERD (gastroesophageal reflux disease)    Hypertension    IBS (irritable bowel syndrome)    Melanoma ([place])    Other specified nontoxic goiter    Palpitations    Polymyalgia rheumatica ([place])    Primary pulmonary hypertension ([place])    Pulmonary embolism ([place])    Stroke ([employer])    ministroke   Supraventricular tachycardia ([place])    Type 2 diabetes mellitus ([place])     Past Surgical History:  Procedure Laterality Date   ABDOMINAL HYSTERECTOMY     APPENDECTOMY     CATARACT EXTRACTION     CORONARY ANGIOPLASTY WITH STENT PLACEMENT     KYPHOPLASTY  05/2020   L4-L5    Family History  Problem Relation Age of Onset   Diabetes Other    Stroke Other  Heart disease Other     Social History   Socioeconomic History   Marital status: Single    Spouse name: Not on file   Number of children: 2   Years of education: Not on file   Highest education level: Not on file  Occupational History   Occupation: retired  Tobacco Use   Smoking status: Light Smoker    Years: 50.00    Pack years: 0.00    Types: Cigarettes   Smokeless tobacco: Never   Tobacco comments:    smokes 1-2 cigarettes per week.  Substance and Sexual Activity   Alcohol use: No    Alcohol/week: 0.0 standard drinks   Drug use: No   Sexual activity: Not on file  Other Topics Concern   Not on file  Social History Narrative   Not on file   Social Determinants of Health   Financial Resource Strain: Not on file  Food Insecurity: No Food Insecurity   Worried About Running Out of Food in the Last Year:  Never true   Ran Out of Food in the Last Year: Never true  Transportation Needs: Not on file  Physical Activity: Not on file  Stress: Not on file  Social Connections: Not on file  Intimate Partner Violence: Not on file    Outpatient Medications Prior to Visit  Medication Sig Dispense Refill   acetaminophen (TYLENOL) 325 MG tablet Take by mouth.     albuterol (VENTOLIN HFA) 108 (90 Base) MCG/ACT inhaler Inhale 2 puffs into the lungs every 6 (six) hours as needed.      citalopram (CELEXA) 20 MG tablet Take 1 tablet (20 mg total) by mouth daily. 90 tablet 1   clopidogrel (PLAVIX) 75 MG tablet Take 1 tablet (75 mg total) by mouth daily. 90 tablet 1   diclofenac Sodium (VOLTAREN) 1 % GEL Apply 2 g topically 4 (four) times daily as needed.      dicyclomine (BENTYL) 10 MG capsule TAKE 1 CAPSULE BY MOUTH 4 TIMES DAILY BEFORE MEAL(S) AND AT BEDTIME 360 capsule 1   Fluticasone-Umeclidin-Vilant (TRELEGY ELLIPTA) 100-62.5-25 MCG/INH AEPB INHALE 1 PUFF ONCE DAILY USE AT THE SAME TIME EACH DAY 1 each 1   furosemide (LASIX) 40 MG tablet Take 2 tablets by mouth twice daily 360 tablet 0   gabapentin (NEURONTIN) 800 MG tablet Take 1 tablet (800 mg total) by mouth 3 (three) times daily. 270 tablet 1   meclizine (ANTIVERT) 25 MG tablet Take 25 mg by mouth 3 (three) times daily as needed.      mirtazapine (REMERON) 15 MG tablet Take 1 tablet (15 mg total) by mouth at bedtime. 30 tablet 3   morphine (MS CONTIN) 30 MG 12 hr tablet Take 1 tablet (30 mg total) by mouth in the morning, at noon, and at bedtime. 90 tablet 0   nitroGLYCERIN (NITROSTAT) 0.4 MG SL tablet Place under the tongue.     ondansetron (ZOFRAN-ODT) 4 MG disintegrating tablet DISSOLVE 1 TO 2 TABLETS IN MOUTH EVERY 8 HOURS AS NEEDED FOR NAUSEA 40 tablet 0   pantoprazole (PROTONIX) 40 MG tablet Take 1 tablet (40 mg total) by mouth daily. 90 tablet 1   Potassium Chloride ER 20 MEQ TBCR Take 1 tablet by mouth 2 (two) times daily with a meal. 180  tablet 1   pramipexole (MIRAPEX) 0.25 MG tablet Take 1 tablet (0.25 mg total) by mouth at bedtime. 30 tablet 2   pravastatin (PRAVACHOL) 40 MG tablet Take 1 tablet (40 mg total)  by mouth daily. 90 tablet 1   No facility-administered medications prior to visit.    Allergies  Allergen Reactions   Codeine    Methocarbamol    Sertraline     Insomnia    Tizanidine     Review of Systems  Constitutional:  Negative for fatigue and fever.  HENT:  Negative for congestion, ear pain, sinus pressure and sore throat.   Eyes:  Negative for pain.  Respiratory:  Negative for cough, chest tightness, shortness of breath and wheezing.   Cardiovascular:  Negative for chest pain and palpitations.  Gastrointestinal:  Positive for abdominal pain (Right lower quadrant pain) and nausea. Negative for constipation, diarrhea and vomiting.  Genitourinary:  Negative for dysuria and hematuria.  Musculoskeletal:  Positive for back pain and gait problem. Negative for arthralgias, joint swelling and myalgias.       Chronic back pain  Skin:  Negative for rash.  Neurological:  Positive for headaches. Negative for dizziness and weakness.  Psychiatric/Behavioral:  Negative for dysphoric mood. The patient is not nervous/anxious.       Objective:    Physical Exam Vitals reviewed.  Constitutional:      Appearance: She is ill-appearing.  HENT:     Mouth/Throat:     Mouth: Mucous membranes are dry.  Cardiovascular:     Rate and Rhythm: Normal rate and regular rhythm.     Pulses: Normal pulses.     Heart sounds: Normal heart sounds.  Pulmonary:     Effort: Pulmonary effort is normal.     Breath sounds: Normal breath sounds.  Abdominal:     Palpations: Abdomen is soft.     Tenderness: There is abdominal tenderness. There is guarding.  Musculoskeletal:        General: Tenderness present.  Skin:    General: Skin is warm and dry.     Capillary Refill: Capillary refill takes less than 2 seconds.  Neurological:      General: No focal deficit present.     Mental Status: She is alert and oriented to person, place, and time.  Psychiatric:        Mood and Affect: Mood normal.        Behavior: Behavior normal.        Thought Content: Thought content normal.        Judgment: Judgment normal.    BP (!) 92/58 (BP Location: Left Arm, Patient Position: Sitting)   Pulse 81   Temp (!) 97.3 F (36.3 C) (Temporal)   Ht $R'5\' 3"'RZ$  (1.6 m)   Wt 121 lb (54.9 kg)   SpO2 95%   BMI 21.43 kg/m  Wt Readings from Last 3 Encounters:  09/16/20 121 lb (54.9 kg)  09/06/20 126 lb (57.2 kg)  08/12/20 129 lb 12.8 oz (58.9 kg)    Health Maintenance Due  Topic Date Due   OPHTHALMOLOGY EXAM  Never done   Hepatitis C Screening  Never done   TETANUS/TDAP  Never done   COVID-19 Vaccine (4 - Booster for Pfizer series) 03/28/2020      Lab Results  Component Value Date   TSH 0.750 09/06/2020   Lab Results  Component Value Date   WBC 9.4 07/29/2020   HGB 14.3 07/29/2020   HCT 42.1 07/29/2020   MCV 97 07/29/2020   PLT 227 07/29/2020   Lab Results  Component Value Date   NA 143 07/29/2020   K 4.6 07/29/2020   CO2 29 07/29/2020   GLUCOSE 170 (H) 07/29/2020  BUN 27 07/29/2020   CREATININE 0.97 07/29/2020   BILITOT 0.2 07/29/2020   ALKPHOS 80 07/29/2020   AST 23 07/29/2020   ALT 17 07/29/2020   PROT 6.8 07/29/2020   ALBUMIN 4.2 07/29/2020   CALCIUM 9.2 07/29/2020   EGFR 61 07/29/2020   Lab Results  Component Value Date   CHOL 141 06/27/2020   Lab Results  Component Value Date   HDL 40 06/27/2020   Lab Results  Component Value Date   LDLCALC 88 06/27/2020   Lab Results  Component Value Date   TRIG 64 06/27/2020   Lab Results  Component Value Date   CHOLHDL 3.5 06/27/2020   Lab Results  Component Value Date   HGBA1C 5.5 06/27/2020         Assessment & Plan:   1. Right lower quadrant abdominal pain - CT ABDOMEN PELVIS W WO CONTRAST - POCT urinalysis dipstick  2. Nausea -  ondansetron (ZOFRAN-ODT) 4 MG disintegrating tablet; DISSOLVE 1 TO 2 TABLETS IN MOUTH EVERY 8 HOURS AS NEEDED FOR NAUSEA  Dispense: 40 tablet; Refill: 0 - CT ABDOMEN PELVIS W WO CONTRAST  3. Anorexia - CT ABDOMEN PELVIS W WO CONTRAST  4. Diarrhea, unspecified type - CT ABDOMEN PELVIS W WO CONTRAST  5. Acute cystitis without hematuria - Urine Culture - sulfamethoxazole-trimethoprim (BACTRIM DS) 800-160 MG tablet; Take 1 tablet by mouth 2 (two) times daily.  Dispense: 14 tablet; Refill: 0  6. Bladder spasms - phenazopyridine (PYRIDIUM) 200 MG tablet; Take 1 tablet (200 mg total) by mouth 3 (three) times daily as needed for pain.  Dispense: 10 tablet; Refill: 0   Push fluids, especially water Use Dove soap for showers We will call you with appt for Ct of abdomen If symptoms become severe, go to nearest emergency room Follow-up if symptoms worsen or fail to improve  Follow-up: As needed if symptoms worsen and or fail to improve  I, Lauren Peterson [place] as a scribe for [employer], NP.,have documented all relevant documentation on the behalf of Rip Harbour, NP,as directed by  Rip Harbour, NP while in the presence of Rip Harbour, NP.   I, Rip Harbour, NP, have reviewed all documentation for this visit. The documentation on 09/16/20 for the exam, diagnosis, procedures, and orders are all accurate and complete.   Signed, [name], DNP

## 2020-09-18 ENCOUNTER — Ambulatory Visit (INDEPENDENT_AMBULATORY_CARE_PROVIDER_SITE_OTHER): Payer: Medicare Other | Admitting: Otolaryngology

## 2020-09-18 DIAGNOSIS — R1031 Right lower quadrant pain: Secondary | ICD-10-CM | POA: Diagnosis not present

## 2020-09-18 DIAGNOSIS — R110 Nausea: Secondary | ICD-10-CM | POA: Diagnosis not present

## 2020-09-18 DIAGNOSIS — R197 Diarrhea, unspecified: Secondary | ICD-10-CM | POA: Diagnosis not present

## 2020-09-18 DIAGNOSIS — K5730 Diverticulosis of large intestine without perforation or abscess without bleeding: Secondary | ICD-10-CM | POA: Diagnosis not present

## 2020-09-18 DIAGNOSIS — I700 Atherosclerosis of aorta: Secondary | ICD-10-CM | POA: Diagnosis not present

## 2020-09-18 DIAGNOSIS — R630 Anorexia: Secondary | ICD-10-CM | POA: Diagnosis not present

## 2020-09-18 DIAGNOSIS — I6523 Occlusion and stenosis of bilateral carotid arteries: Secondary | ICD-10-CM | POA: Diagnosis not present

## 2020-09-18 DIAGNOSIS — D649 Anemia, unspecified: Secondary | ICD-10-CM | POA: Diagnosis not present

## 2020-09-18 DIAGNOSIS — K5792 Diverticulitis of intestine, part unspecified, without perforation or abscess without bleeding: Secondary | ICD-10-CM | POA: Diagnosis not present

## 2020-09-18 LAB — URINE CULTURE

## 2020-09-19 ENCOUNTER — Other Ambulatory Visit: Payer: Self-pay

## 2020-09-19 DIAGNOSIS — F17210 Nicotine dependence, cigarettes, uncomplicated: Secondary | ICD-10-CM | POA: Diagnosis not present

## 2020-09-19 DIAGNOSIS — I2510 Atherosclerotic heart disease of native coronary artery without angina pectoris: Secondary | ICD-10-CM | POA: Diagnosis not present

## 2020-09-19 DIAGNOSIS — F419 Anxiety disorder, unspecified: Secondary | ICD-10-CM | POA: Diagnosis not present

## 2020-09-19 DIAGNOSIS — D649 Anemia, unspecified: Secondary | ICD-10-CM | POA: Diagnosis not present

## 2020-09-19 DIAGNOSIS — N390 Urinary tract infection, site not specified: Secondary | ICD-10-CM | POA: Diagnosis not present

## 2020-09-19 DIAGNOSIS — F32A Depression, unspecified: Secondary | ICD-10-CM | POA: Diagnosis not present

## 2020-09-19 MED ORDER — CITALOPRAM HYDROBROMIDE 20 MG PO TABS: 20.0000 mg | ORAL_TABLET | Freq: Every day | ORAL | 1 refills | Status: DC

## 2020-09-24 DIAGNOSIS — D649 Anemia, unspecified: Secondary | ICD-10-CM | POA: Diagnosis not present

## 2020-09-24 DIAGNOSIS — F32A Depression, unspecified: Secondary | ICD-10-CM | POA: Diagnosis not present

## 2020-09-24 DIAGNOSIS — F17210 Nicotine dependence, cigarettes, uncomplicated: Secondary | ICD-10-CM | POA: Diagnosis not present

## 2020-09-24 DIAGNOSIS — N390 Urinary tract infection, site not specified: Secondary | ICD-10-CM | POA: Diagnosis not present

## 2020-09-24 DIAGNOSIS — F419 Anxiety disorder, unspecified: Secondary | ICD-10-CM | POA: Diagnosis not present

## 2020-09-24 DIAGNOSIS — I2510 Atherosclerotic heart disease of native coronary artery without angina pectoris: Secondary | ICD-10-CM | POA: Diagnosis not present

## 2020-09-25 DIAGNOSIS — F32A Depression, unspecified: Secondary | ICD-10-CM | POA: Diagnosis not present

## 2020-09-25 DIAGNOSIS — I2510 Atherosclerotic heart disease of native coronary artery without angina pectoris: Secondary | ICD-10-CM | POA: Diagnosis not present

## 2020-09-25 DIAGNOSIS — F419 Anxiety disorder, unspecified: Secondary | ICD-10-CM | POA: Diagnosis not present

## 2020-09-25 DIAGNOSIS — N390 Urinary tract infection, site not specified: Secondary | ICD-10-CM | POA: Diagnosis not present

## 2020-09-25 DIAGNOSIS — D649 Anemia, unspecified: Secondary | ICD-10-CM | POA: Diagnosis not present

## 2020-09-25 DIAGNOSIS — F17210 Nicotine dependence, cigarettes, uncomplicated: Secondary | ICD-10-CM | POA: Diagnosis not present

## 2020-09-26 ENCOUNTER — Telehealth: Payer: Self-pay

## 2020-09-26 ENCOUNTER — Other Ambulatory Visit: Payer: Self-pay | Admitting: Nurse Practitioner

## 2020-09-26 DIAGNOSIS — B3749 Other urogenital candidiasis: Secondary | ICD-10-CM

## 2020-09-26 MED ORDER — FLUCONAZOLE 150 MG PO TABS: 150.0000 mg | ORAL_TABLET | Freq: Once | ORAL | 0 refills | Status: AC

## 2020-09-26 NOTE — Telephone Encounter (Signed)
Patient called and stated that after a few days of taking the antibiotic for her UTI, she is now having a thick white discharge which she believes to be a yeast infection. She is wanting to know if you could send in the diflucan for the yeast infection to [facility] [place]? Donna Little

## 2020-09-30 ENCOUNTER — Other Ambulatory Visit: Payer: Self-pay | Admitting: Nurse Practitioner

## 2020-09-30 DIAGNOSIS — B3749 Other urogenital candidiasis: Secondary | ICD-10-CM

## 2020-09-30 MED ORDER — FLUCONAZOLE 150 MG PO TABS: 150.0000 mg | ORAL_TABLET | Freq: Once | ORAL | 0 refills | Status: AC

## 2020-10-01 DIAGNOSIS — N390 Urinary tract infection, site not specified: Secondary | ICD-10-CM | POA: Diagnosis not present

## 2020-10-01 DIAGNOSIS — F419 Anxiety disorder, unspecified: Secondary | ICD-10-CM | POA: Diagnosis not present

## 2020-10-01 DIAGNOSIS — F17210 Nicotine dependence, cigarettes, uncomplicated: Secondary | ICD-10-CM | POA: Diagnosis not present

## 2020-10-01 DIAGNOSIS — F32A Depression, unspecified: Secondary | ICD-10-CM | POA: Diagnosis not present

## 2020-10-01 DIAGNOSIS — I2510 Atherosclerotic heart disease of native coronary artery without angina pectoris: Secondary | ICD-10-CM | POA: Diagnosis not present

## 2020-10-01 DIAGNOSIS — D649 Anemia, unspecified: Secondary | ICD-10-CM | POA: Diagnosis not present

## 2020-10-03 DIAGNOSIS — F32A Depression, unspecified: Secondary | ICD-10-CM | POA: Diagnosis not present

## 2020-10-03 DIAGNOSIS — F419 Anxiety disorder, unspecified: Secondary | ICD-10-CM | POA: Diagnosis not present

## 2020-10-03 DIAGNOSIS — N390 Urinary tract infection, site not specified: Secondary | ICD-10-CM | POA: Diagnosis not present

## 2020-10-03 DIAGNOSIS — F17210 Nicotine dependence, cigarettes, uncomplicated: Secondary | ICD-10-CM | POA: Diagnosis not present

## 2020-10-03 DIAGNOSIS — I2510 Atherosclerotic heart disease of native coronary artery without angina pectoris: Secondary | ICD-10-CM | POA: Diagnosis not present

## 2020-10-03 DIAGNOSIS — D649 Anemia, unspecified: Secondary | ICD-10-CM | POA: Diagnosis not present

## 2020-10-08 DIAGNOSIS — F32A Depression, unspecified: Secondary | ICD-10-CM | POA: Diagnosis not present

## 2020-10-08 DIAGNOSIS — F17210 Nicotine dependence, cigarettes, uncomplicated: Secondary | ICD-10-CM | POA: Diagnosis not present

## 2020-10-08 DIAGNOSIS — N390 Urinary tract infection, site not specified: Secondary | ICD-10-CM | POA: Diagnosis not present

## 2020-10-08 DIAGNOSIS — I2510 Atherosclerotic heart disease of native coronary artery without angina pectoris: Secondary | ICD-10-CM | POA: Diagnosis not present

## 2020-10-08 DIAGNOSIS — F419 Anxiety disorder, unspecified: Secondary | ICD-10-CM | POA: Diagnosis not present

## 2020-10-08 DIAGNOSIS — D649 Anemia, unspecified: Secondary | ICD-10-CM | POA: Diagnosis not present

## 2020-10-11 DIAGNOSIS — R64 Cachexia: Secondary | ICD-10-CM | POA: Diagnosis not present

## 2020-10-11 DIAGNOSIS — R410 Disorientation, unspecified: Secondary | ICD-10-CM | POA: Diagnosis not present

## 2020-10-11 DIAGNOSIS — I672 Cerebral atherosclerosis: Secondary | ICD-10-CM | POA: Diagnosis not present

## 2020-10-11 DIAGNOSIS — R0902 Hypoxemia: Secondary | ICD-10-CM | POA: Diagnosis not present

## 2020-10-11 DIAGNOSIS — R404 Transient alteration of awareness: Secondary | ICD-10-CM | POA: Diagnosis not present

## 2020-10-11 DIAGNOSIS — R1084 Generalized abdominal pain: Secondary | ICD-10-CM | POA: Diagnosis not present

## 2020-10-11 DIAGNOSIS — E876 Hypokalemia: Secondary | ICD-10-CM | POA: Diagnosis not present

## 2020-10-11 DIAGNOSIS — I700 Atherosclerosis of aorta: Secondary | ICD-10-CM | POA: Diagnosis not present

## 2020-10-11 DIAGNOSIS — R0689 Other abnormalities of breathing: Secondary | ICD-10-CM | POA: Diagnosis not present

## 2020-10-11 DIAGNOSIS — F0391 Unspecified dementia with behavioral disturbance: Secondary | ICD-10-CM | POA: Diagnosis not present

## 2020-10-11 DIAGNOSIS — R4182 Altered mental status, unspecified: Secondary | ICD-10-CM | POA: Diagnosis not present

## 2020-10-11 DIAGNOSIS — G928 Other toxic encephalopathy: Secondary | ICD-10-CM | POA: Diagnosis not present

## 2020-10-11 DIAGNOSIS — E860 Dehydration: Secondary | ICD-10-CM | POA: Diagnosis not present

## 2020-10-12 DIAGNOSIS — R410 Disorientation, unspecified: Secondary | ICD-10-CM | POA: Diagnosis not present

## 2020-10-12 DIAGNOSIS — I739 Peripheral vascular disease, unspecified: Secondary | ICD-10-CM | POA: Diagnosis present

## 2020-10-12 DIAGNOSIS — E7800 Pure hypercholesterolemia, unspecified: Secondary | ICD-10-CM | POA: Diagnosis present

## 2020-10-12 DIAGNOSIS — I6503 Occlusion and stenosis of bilateral vertebral arteries: Secondary | ICD-10-CM | POA: Diagnosis present

## 2020-10-12 DIAGNOSIS — G9389 Other specified disorders of brain: Secondary | ICD-10-CM | POA: Diagnosis not present

## 2020-10-12 DIAGNOSIS — Z85828 Personal history of other malignant neoplasm of skin: Secondary | ICD-10-CM | POA: Diagnosis not present

## 2020-10-12 DIAGNOSIS — I252 Old myocardial infarction: Secondary | ICD-10-CM | POA: Diagnosis not present

## 2020-10-12 DIAGNOSIS — I700 Atherosclerosis of aorta: Secondary | ICD-10-CM | POA: Diagnosis not present

## 2020-10-12 DIAGNOSIS — Z79899 Other long term (current) drug therapy: Secondary | ICD-10-CM | POA: Diagnosis not present

## 2020-10-12 DIAGNOSIS — Z8719 Personal history of other diseases of the digestive system: Secondary | ICD-10-CM | POA: Diagnosis not present

## 2020-10-12 DIAGNOSIS — I6522 Occlusion and stenosis of left carotid artery: Secondary | ICD-10-CM | POA: Diagnosis present

## 2020-10-12 DIAGNOSIS — I672 Cerebral atherosclerosis: Secondary | ICD-10-CM | POA: Diagnosis not present

## 2020-10-12 DIAGNOSIS — R1110 Vomiting, unspecified: Secondary | ICD-10-CM | POA: Diagnosis not present

## 2020-10-12 DIAGNOSIS — M549 Dorsalgia, unspecified: Secondary | ICD-10-CM | POA: Diagnosis not present

## 2020-10-12 DIAGNOSIS — F419 Anxiety disorder, unspecified: Secondary | ICD-10-CM | POA: Diagnosis present

## 2020-10-12 DIAGNOSIS — J42 Unspecified chronic bronchitis: Secondary | ICD-10-CM | POA: Diagnosis present

## 2020-10-12 DIAGNOSIS — R64 Cachexia: Secondary | ICD-10-CM | POA: Diagnosis not present

## 2020-10-12 DIAGNOSIS — F32A Depression, unspecified: Secondary | ICD-10-CM | POA: Diagnosis present

## 2020-10-12 DIAGNOSIS — M1990 Unspecified osteoarthritis, unspecified site: Secondary | ICD-10-CM | POA: Diagnosis present

## 2020-10-12 DIAGNOSIS — E860 Dehydration: Secondary | ICD-10-CM | POA: Diagnosis not present

## 2020-10-12 DIAGNOSIS — R42 Dizziness and giddiness: Secondary | ICD-10-CM | POA: Diagnosis not present

## 2020-10-12 DIAGNOSIS — I951 Orthostatic hypotension: Secondary | ICD-10-CM | POA: Diagnosis not present

## 2020-10-12 DIAGNOSIS — Z6822 Body mass index (BMI) 22.0-22.9, adult: Secondary | ICD-10-CM | POA: Diagnosis not present

## 2020-10-12 DIAGNOSIS — R4182 Altered mental status, unspecified: Secondary | ICD-10-CM | POA: Diagnosis not present

## 2020-10-12 DIAGNOSIS — I2510 Atherosclerotic heart disease of native coronary artery without angina pectoris: Secondary | ICD-10-CM | POA: Diagnosis present

## 2020-10-12 DIAGNOSIS — Z87440 Personal history of urinary (tract) infections: Secondary | ICD-10-CM | POA: Diagnosis not present

## 2020-10-12 DIAGNOSIS — I6523 Occlusion and stenosis of bilateral carotid arteries: Secondary | ICD-10-CM | POA: Diagnosis not present

## 2020-10-12 DIAGNOSIS — Z7901 Long term (current) use of anticoagulants: Secondary | ICD-10-CM | POA: Diagnosis not present

## 2020-10-12 DIAGNOSIS — G928 Other toxic encephalopathy: Secondary | ICD-10-CM | POA: Diagnosis not present

## 2020-10-12 DIAGNOSIS — M4802 Spinal stenosis, cervical region: Secondary | ICD-10-CM | POA: Diagnosis not present

## 2020-10-12 DIAGNOSIS — F0391 Unspecified dementia with behavioral disturbance: Secondary | ICD-10-CM | POA: Diagnosis not present

## 2020-10-12 DIAGNOSIS — R109 Unspecified abdominal pain: Secondary | ICD-10-CM | POA: Diagnosis not present

## 2020-10-12 DIAGNOSIS — F05 Delirium due to known physiological condition: Secondary | ICD-10-CM | POA: Diagnosis not present

## 2020-10-12 DIAGNOSIS — E876 Hypokalemia: Secondary | ICD-10-CM | POA: Diagnosis not present

## 2020-10-12 DIAGNOSIS — K580 Irritable bowel syndrome with diarrhea: Secondary | ICD-10-CM | POA: Diagnosis present

## 2020-10-12 DIAGNOSIS — G8929 Other chronic pain: Secondary | ICD-10-CM | POA: Diagnosis not present

## 2020-10-14 DIAGNOSIS — R4182 Altered mental status, unspecified: Secondary | ICD-10-CM | POA: Diagnosis not present

## 2020-10-14 DIAGNOSIS — G9389 Other specified disorders of brain: Secondary | ICD-10-CM | POA: Diagnosis not present

## 2020-10-21 ENCOUNTER — Telehealth: Payer: Self-pay

## 2020-10-21 NOTE — Telephone Encounter (Signed)
  Transition Care Management Follow-up Telephone Call    Donna Little 10/15/1945  Admit Date: 10/12/20 Discharge Date: 10/18/20 Discharged from where: [facility]  Diagnoses: Acute metabolic encephalopathy presumed secondary to polypharmacy  2 day post discharge: 10/20/20 7 day post discharge: 10/25/20 14 day post discharge: 11/01/20  Donna Little was discharged from [facility] on 10/18/20 with the diagnoses listed above.  She was contacted today via telephone in regards to transition of care.  Unfortunately I did not get an answer at her home nor did I get an answer when I attempted to call her daughter.  Patient was admitted after presenting to the ED with altered mental status.  After neurology and cardiology consult there is no clear cause for her altered mental status.  Patient improved to her baseline and was discharged.  She does have chronic back pain which is treated with PRN Morphine (30 mg PO TID PRN) as well as Gabapentin 800 mg TID  Patient was hypertensive when she first arrived and has been given PRN Hydralazine to be taken if BP is 180 or greater.  She is to continue daily BP log until further instructions from cardiology.  Donna Little was hypokalemic on presentation however improved with oral supplementation  -Infectious workup NEGATIVE -Diagnostic Studies Include: CT Angiography Neck, MRA Neck W/WO contrast, EEG, MRI Head, CT Head, CT Abdomen Pelvis with contrast  Discharge Instructions: Follow-up with PCP to discuss gabapentin and morphine, Cardiology follow-up to discuss blood pressure, Home Health PT was ordered at discharge  Items Reviewed: Did the pt receive and understand the discharge instructions provided?  Medications obtained and verified? Yes  Any new allergies since your discharge? No  Dietary orders reviewed? Yes Do you have support at home?   Home Care and Equipment/Supplies: Were home health services ordered? yes Were any new equipment or medical  supplies ordered?  No  Any patient concerns? no  If their condition worsens, is the pt aware to call PCP or go to the Emergency Dept.? Yes Was the patient provided with contact information for the PCP's office/after hours number? Yes Was to pt encouraged to call back with questions or concerns? Yes    Donna Iron, LPN 32/20/25 42:70 PM

## 2020-10-23 ENCOUNTER — Other Ambulatory Visit: Payer: Self-pay

## 2020-10-23 ENCOUNTER — Ambulatory Visit (INDEPENDENT_AMBULATORY_CARE_PROVIDER_SITE_OTHER): Payer: Medicare Other | Admitting: Family Medicine

## 2020-10-23 VITALS — BP 104/60 | HR 72 | Temp 97.1°F | Resp 16 | Ht 63.0 in | Wt 128.0 lb

## 2020-10-23 DIAGNOSIS — F1120 Opioid dependence, uncomplicated: Secondary | ICD-10-CM

## 2020-10-23 DIAGNOSIS — G9341 Metabolic encephalopathy: Secondary | ICD-10-CM | POA: Diagnosis not present

## 2020-10-23 DIAGNOSIS — R930 Abnormal findings on diagnostic imaging of skull and head, not elsewhere classified: Secondary | ICD-10-CM | POA: Diagnosis not present

## 2020-10-23 DIAGNOSIS — G894 Chronic pain syndrome: Secondary | ICD-10-CM

## 2020-10-23 DIAGNOSIS — R410 Disorientation, unspecified: Secondary | ICD-10-CM

## 2020-10-23 MED ORDER — LIDOCAINE 5 % EX PTCH: 3.0000 | MEDICATED_PATCH | CUTANEOUS | 3 refills | Status: DC

## 2020-10-23 NOTE — Patient Instructions (Addendum)
Recommend claritin, allegra, or zyrtec for itching.  No benadryl.  Decrease gabapentin to 800 mg one twice a day.  Decrease morphine to twice a day if able.  Lidoderm patch prescribed. May only use for 12 hours in a 24 hour period.  Referral neurology.  Difference in bp between arms. I am going to schedule a follow up test for this like what Joe had.

## 2020-10-23 NOTE — Progress Notes (Signed)
Acute Office Visit  Subjective:    Patient ID: Donna Little, female    DOB: 1945-08-10, 75 y.o.   MRN: [id number]  Chief Complaint  Patient presents with   Hospitalization Follow-up    HPI Patient is in today for hospital follow up. Pt was admitted 10/12/20 through 10/18/2020. Pt came in confused and was diagnosed with acute metabolic encephalopathy presumed secondary to polypharmacy. Work up was very thorough. Both neurology and cardiology were consulted during her stay. PT was very hypertensive hwen she arrived and was given hydralazine to bring her bp down. Her bp has been better since she was discharged. She had hypokalemia, which was replaced. ID work up was negative. Her bp is different in her left arm to right CTA neck, EEG, CT brain, CT abd/pelvis normal. MRI and MRA of brain were not quite normal and per pt hospitalist was concerned about possibility of MS, but neurologist disagreed.  MRA did reach subclavian arteries and nothing was critically stenosed to explain altered mental status or difference in bps  between arms.     Past Medical History:  Diagnosis Date   A-fib [facility])    Atherosclerosis of both carotid arteries 2022   noted per CT on 05/14/20   Chronic pain syndrome    Chronic systolic heart failure (HCC)    Depression    Emphysema of lung (HCC) 2022   noted per CT scan on 05/14/20   Generalized atherosclerosis    GERD (gastroesophageal reflux disease)    Hypertension    IBS (irritable bowel syndrome)    Melanoma (HCC)    Other specified nontoxic goiter    Palpitations    Polymyalgia rheumatica (HCC)    Primary pulmonary hypertension (HCC)    Pulmonary embolism (HCC)    Stroke (HCC)    ministroke   Supraventricular tachycardia (HCC)    Type 2 diabetes mellitus (HCC)     Past Surgical History:  Procedure Laterality Date   ABDOMINAL HYSTERECTOMY     APPENDECTOMY     CATARACT EXTRACTION     CORONARY ANGIOPLASTY WITH STENT PLACEMENT     KYPHOPLASTY   05/2020   L4-L5    Family History  Problem Relation Age of Onset   Diabetes Other    Stroke Other    Heart disease Other     Social History   Socioeconomic History   Marital status: Single    Spouse name: Not on file   Number of children: 2   Years of education: Not on file   Highest education level: Not on file  Occupational History   Occupation: retired  Tobacco Use   Smoking status: Light Smoker    Years: 50.00    Types: Cigarettes   Smokeless tobacco: Never   Tobacco comments:    smokes 1-2 cigarettes per week.  Substance and Sexual Activity   Alcohol use: No    Alcohol/week: 0.0 standard drinks   Drug use: No   Sexual activity: Not on file  Other Topics Concern   Not on file  Social History Narrative   Not on file   Social Determinants of Health   Financial Resource Strain: Not on file  Food Insecurity: Not on file  Transportation Needs: Not on file  Physical Activity: Not on file  Stress: Not on file  Social Connections: Not on file  Intimate Partner Violence: Not on file    Outpatient Medications Prior to Visit  Medication Sig Dispense Refill   acetaminophen (TYLENOL) 325  MG tablet Take by mouth.     albuterol (VENTOLIN HFA) 108 (90 Base) MCG/ACT inhaler Inhale 2 puffs into the lungs every 6 (six) hours as needed.      citalopram (CELEXA) 20 MG tablet Take 1 tablet (20 mg total) by mouth daily. 90 tablet 1   clopidogrel (PLAVIX) 75 MG tablet Take 1 tablet (75 mg total) by mouth daily. 90 tablet 1   diclofenac Sodium (VOLTAREN) 1 % GEL Apply 2 g topically 4 (four) times daily as needed.      dicyclomine (BENTYL) 10 MG capsule TAKE 1 CAPSULE BY MOUTH 4 TIMES DAILY BEFORE MEAL(S) AND AT BEDTIME 360 capsule 1   Fluticasone-Umeclidin-Vilant (TRELEGY ELLIPTA) 100-62.5-25 MCG/INH AEPB INHALE 1 PUFF ONCE DAILY USE AT THE SAME TIME EACH DAY 1 each 1   furosemide (LASIX) 40 MG tablet Take 2 tablets by mouth twice daily 360 tablet 0   gabapentin (NEURONTIN) 800  MG tablet Take 1 tablet (800 mg total) by mouth 3 (three) times daily. 270 tablet 1   hydrALAZINE (APRESOLINE) 25 MG tablet Take 25 mg by mouth as needed (DAILY if blood pressure is 180 or greater). Take one daily if blood pressure is 180 or greater     meclizine (ANTIVERT) 25 MG tablet Take 25 mg by mouth 3 (three) times daily as needed.      mirtazapine (REMERON) 15 MG tablet Take 1 tablet (15 mg total) by mouth at bedtime. 30 tablet 3   morphine (MS CONTIN) 30 MG 12 hr tablet Take 1 tablet (30 mg total) by mouth in the morning, at noon, and at bedtime. 90 tablet 0   nitroGLYCERIN (NITROSTAT) 0.4 MG SL tablet Place under the tongue.     ondansetron (ZOFRAN-ODT) 4 MG disintegrating tablet DISSOLVE 1 TO 2 TABLETS IN MOUTH EVERY 8 HOURS AS NEEDED FOR NAUSEA 40 tablet 0   pantoprazole (PROTONIX) 40 MG tablet Take 1 tablet (40 mg total) by mouth daily. 90 tablet 1   phenazopyridine (PYRIDIUM) 200 MG tablet Take 1 tablet (200 mg total) by mouth 3 (three) times daily as needed for pain. 10 tablet 0   Potassium Chloride ER 20 MEQ TBCR Take 1 tablet by mouth 2 (two) times daily with a meal. 180 tablet 1   pramipexole (MIRAPEX) 0.25 MG tablet Take 1 tablet (0.25 mg total) by mouth at bedtime. 30 tablet 2   pravastatin (PRAVACHOL) 40 MG tablet Take 1 tablet (40 mg total) by mouth daily. 90 tablet 1   sulfamethoxazole-trimethoprim (BACTRIM DS) 800-160 MG tablet Take 1 tablet by mouth 2 (two) times daily. 14 tablet 0   No facility-administered medications prior to visit.    Allergies  Allergen Reactions   Codeine    Methocarbamol    Sertraline     Insomnia    Tizanidine     Review of Systems  Constitutional:  Positive for fatigue. Negative for chills and fever.  HENT:  Negative for congestion, rhinorrhea and sore throat.   Respiratory:  Positive for cough. Negative for shortness of breath.   Cardiovascular:  Positive for chest pain and leg swelling.  Gastrointestinal:  Negative for abdominal  pain, constipation, diarrhea, nausea and vomiting.  Genitourinary:  Negative for dysuria and urgency.  Musculoskeletal:  Negative for back pain and myalgias.  Neurological:  Positive for headaches. Negative for dizziness, weakness and light-headedness.       Balance issues  Psychiatric/Behavioral:  Positive for dysphoric mood. The patient is not nervous/anxious.  Objective:    Physical Exam Vitals reviewed.  Constitutional:      Appearance: Normal appearance. She is normal weight.  Neck:     Vascular: No carotid bruit.  Cardiovascular:     Rate and Rhythm: Normal rate. Rhythm irregular.     Heart sounds: Normal heart sounds.     Comments: Minimal change between radial pulses between arms.  Pulmonary:     Effort: Pulmonary effort is normal. No respiratory distress.     Breath sounds: Normal breath sounds.  Abdominal:     General: Abdomen is flat. Bowel sounds are normal.     Palpations: Abdomen is soft.     Tenderness: There is no abdominal tenderness.  Neurological:     Mental Status: She is alert and oriented to person, place, and time.  Psychiatric:        Mood and Affect: Mood normal.        Behavior: Behavior normal.    BP 104/60 (BP Location: Left Arm, Patient Position: Sitting)   Pulse 72   Temp (!) 97.1 F (36.2 C)   Resp 16   Ht $R'5\' 3"'Wf$  (1.6 m)   Wt 128 lb (58.1 kg)   BMI 22.67 kg/m  Wt Readings from Last 3 Encounters:  10/23/20 128 lb (58.1 kg)  09/16/20 121 lb (54.9 kg)  09/06/20 126 lb (57.2 kg)    Health Maintenance Due  Topic Date Due   OPHTHALMOLOGY EXAM  Never done   Hepatitis C Screening  Never done   TETANUS/TDAP  Never done   COVID-19 Vaccine (4 - Booster for Pfizer series) 03/28/2020    There are no preventive care reminders to display for this patient.   Lab Results  Component Value Date   TSH 0.750 09/06/2020   Lab Results  Component Value Date   WBC 9.4 07/29/2020   HGB 14.3 07/29/2020   HCT 42.1 07/29/2020   MCV 97  07/29/2020   PLT 227 07/29/2020   Lab Results  Component Value Date   NA 143 07/29/2020   K 4.6 07/29/2020   CO2 29 07/29/2020   GLUCOSE 170 (H) 07/29/2020   BUN 27 07/29/2020   CREATININE 0.97 07/29/2020   BILITOT 0.2 07/29/2020   ALKPHOS 80 07/29/2020   AST 23 07/29/2020   ALT 17 07/29/2020   PROT 6.8 07/29/2020   ALBUMIN 4.2 07/29/2020   CALCIUM 9.2 07/29/2020   EGFR 61 07/29/2020   Lab Results  Component Value Date   CHOL 141 06/27/2020   Lab Results  Component Value Date   HDL 40 06/27/2020   Lab Results  Component Value Date   LDLCALC 88 06/27/2020   Lab Results  Component Value Date   TRIG 64 06/27/2020   Lab Results  Component Value Date   CHOLHDL 3.5 06/27/2020   Lab Results  Component Value Date   HGBA1C 5.5 06/27/2020       Assessment & Plan:  1. Abnormal MRI of head - Ambulatory referral to Neurology  2. Confusion: resolved - CBC with Differential/Platelet - Comprehensive metabolic panel  3. Acute metabolic encephalopathy: resolved.  Pt to try to decrease morphine to bid dosing.   4. Uncomplicated opioid dependence ([place]) Rx for lidoderm patch sent.   5. Chronic pain syndrome  Uncontrolled. Decrease morphine anyways to decrease sedation/confusion  Meds ordered this encounter  Medications   lidocaine (LIDODERM) 5 %    Sig: Place 3 patches onto the skin daily. Remove & Discard patch within 12 hours  or as directed by MD    Dispense:  90 patch    Refill:  3    Orders Placed This Encounter  Procedures   CBC with Differential/Platelet   Comprehensive metabolic panel   Ambulatory referral to Neurology     Follow-up: Return in about 27 days (around 11/19/2020).  An After Visit Summary was printed and given to the patient.  [name], MD [facility] [phone]

## 2020-10-25 ENCOUNTER — Encounter: Payer: Self-pay | Admitting: Family Medicine

## 2020-10-30 ENCOUNTER — Telehealth: Payer: Self-pay

## 2020-10-30 ENCOUNTER — Other Ambulatory Visit: Payer: Self-pay

## 2020-10-30 MED ORDER — MORPHINE SULFATE ER 30 MG PO TBCR: 30.0000 mg | EXTENDED_RELEASE_TABLET | Freq: Three times a day (TID) | ORAL | 0 refills | Status: DC

## 2020-10-30 NOTE — Telephone Encounter (Signed)
-----   Message from [name], MD sent at 10/25/2020  2:35 PM EDT ----- Regarding: bp difference between arms I reviewed studies from recent hospital stay and the scans went far enough down to rule out a blockage in arteries that supply arms. This is not the reason for her bp variation between arms.  I would reassure her that some variation is normal. Also ask her to discuss it with her cardiologist.  Dr. Tobie Poet

## 2020-10-30 NOTE — Telephone Encounter (Signed)
Called pt. Pt VU and will speak w/ cardiologist.   Royce Macadamia, CCMA 10/30/20 1:32 PM

## 2020-11-05 ENCOUNTER — Other Ambulatory Visit: Payer: Self-pay | Admitting: Family Medicine

## 2020-11-12 ENCOUNTER — Other Ambulatory Visit: Payer: Self-pay

## 2020-11-12 ENCOUNTER — Encounter: Payer: Self-pay | Admitting: Nurse Practitioner

## 2020-11-12 ENCOUNTER — Ambulatory Visit (INDEPENDENT_AMBULATORY_CARE_PROVIDER_SITE_OTHER): Payer: Medicare Other | Admitting: Nurse Practitioner

## 2020-11-12 VITALS — BP 128/62 | HR 88 | Temp 97.8°F | Ht 63.0 in | Wt 120.0 lb

## 2020-11-12 DIAGNOSIS — N3000 Acute cystitis without hematuria: Secondary | ICD-10-CM

## 2020-11-12 DIAGNOSIS — N390 Urinary tract infection, site not specified: Secondary | ICD-10-CM | POA: Diagnosis not present

## 2020-11-12 LAB — POCT URINALYSIS DIPSTICK
Bilirubin, UA: NEGATIVE
Blood, UA: NEGATIVE
Glucose, UA: NEGATIVE
Ketones, UA: NEGATIVE
Nitrite, UA: NEGATIVE
Protein, UA: NEGATIVE
Spec Grav, UA: 1.015 (ref 1.010–1.025)
Urobilinogen, UA: NEGATIVE E.U./dL — AB
pH, UA: 6 (ref 5.0–8.0)

## 2020-11-12 MED ORDER — NITROFURANTOIN MONOHYD MACRO 100 MG PO CAPS: 100.0000 mg | ORAL_CAPSULE | Freq: Two times a day (BID) | ORAL | 0 refills | Status: DC

## 2020-11-12 NOTE — Patient Instructions (Addendum)
Take Macrobid twice daily for 10 days Push fluids, especially water Empty bladder every 2-3 hours while awake Use Dove soap for bathing Consider referral to urology   Urinary Tract Infection, Adult A urinary tract infection (UTI) is an infection of any part of the urinary tract. The urinary tract includes: The kidneys. The ureters. The bladder. The urethra. These organs make, store, and get rid of pee (urine) in the body. What are the causes? This infection is caused by germs (bacteria) in your genital area. These germs grow and cause swelling (inflammation) of your urinary tract. What increases the risk? The following factors may make you more likely to develop this condition: Using a small, thin tube (catheter) to drain pee. Not being able to control when you pee or poop (incontinence). Being female. If you are female, these things can increase the risk: Using these methods to prevent pregnancy: A medicine that kills sperm (spermicide). A device that blocks sperm (diaphragm). Having low levels of a female hormone (estrogen). Being pregnant. You are more likely to develop this condition if: You have genes that add to your risk. You are sexually active. You take antibiotic medicines. You have trouble peeing because of: A prostate that is bigger than normal, if you are female. A blockage in the part of your body that drains pee from the bladder. A kidney stone. A nerve condition that affects your bladder. Not getting enough to drink. Not peeing often enough. You have other conditions, such as: Diabetes. A weak disease-fighting system (immune system). Sickle cell disease. Gout. Injury of the spine. What are the signs or symptoms? Symptoms of this condition include: Needing to pee right away. Peeing small amounts often. Pain or burning when peeing. Blood in the pee. Pee that smells bad or not like normal. Trouble peeing. Pee that is cloudy. Fluid coming from the vagina,  if you are female. Pain in the belly or lower back. Other symptoms include: Vomiting. Not feeling hungry. Feeling mixed up (confused). This may be the first symptom in older adults. Being tired and grouchy (irritable). A fever. Watery poop (diarrhea). How is this treated? Taking antibiotic medicine. Taking other medicines. Drinking enough water. In some cases, you may need to see a specialist. Follow these instructions at home:  Medicines Take over-the-counter and prescription medicines only as told by your doctor. If you were prescribed an antibiotic medicine, take it as told by your doctor. Do not stop taking it even if you start to feel better. General instructions Make sure you: Pee until your bladder is empty. Do not hold pee for a long time. Empty your bladder after sex. Wipe from front to back after peeing or pooping if you are a female. Use each tissue one time when you wipe. Drink enough fluid to keep your pee pale yellow. Keep all follow-up visits. Contact a doctor if: You do not get better after 1-2 days. Your symptoms go away and then come back. Get help right away if: You have very bad back pain. You have very bad pain in your lower belly. You have a fever. You have chills. You feeling like you will vomit or you vomit. Summary A urinary tract infection (UTI) is an infection of any part of the urinary tract. This condition is caused by germs in your genital area. There are many risk factors for a UTI. Treatment includes antibiotic medicines. Drink enough fluid to keep your pee pale yellow. This information is not intended to replace advice given to  you by your health care provider. Make sure you discuss any questions you have with your healthcare provider. Document Revised: 11/03/2019 Document Reviewed: 11/03/2019 Elsevier Patient Education  [place].

## 2020-11-12 NOTE — Progress Notes (Signed)
Acute Office Visit  Subjective:    Patient ID: Donna Little, female    DOB: 10-29-1945, 75 y.o.   MRN: [id number]  CC: Dysuria   HPI Donna Little is a 75 year old Caucasian female that is in today for dysuria and back pain. She is accompanied by family friend. Onset of symptoms today. She has chronic back pain that is treated with morphine. She denies fever, chills, hematuria, or renal colic.Donna Little tells me that she has experienced recurrent UTIs this year with one that led to hospitalization due to confusion. States she previously was seen by urologist for further evaluation but she did not receive further testing or treatment to prevent recurrent UTIs. She has declined second referral to urologist today.   Past Medical History:  Diagnosis Date   A-fib [facility])    Atherosclerosis of both carotid arteries 2022   noted per CT on 05/14/20   Chronic pain syndrome    Chronic systolic heart failure (HCC)    Depression    Emphysema of lung ([place]) 2022   noted per CT scan on 05/14/20   Generalized atherosclerosis    GERD (gastroesophageal reflux disease)    Hypertension    IBS (irritable bowel syndrome)    Melanoma (HCC)    Other specified nontoxic goiter    Palpitations    Polymyalgia rheumatica (HCC)    Primary pulmonary hypertension ([place])    Pulmonary embolism (HCC)    Stroke ([place])    ministroke   Supraventricular tachycardia ([place])    Type 2 diabetes mellitus ([place])     Past Surgical History:  Procedure Laterality Date   ABDOMINAL HYSTERECTOMY     APPENDECTOMY     CATARACT EXTRACTION     CORONARY ANGIOPLASTY WITH STENT PLACEMENT     KYPHOPLASTY  05/2020   L4-L5    Family History  Problem Relation Age of Onset   Diabetes Other    Stroke Other    Heart disease Other     Social History   Socioeconomic History   Marital status: Single    Spouse name: Not on file   Number of children: 2   Years of education: Not on file   Highest education level: Not on file  Occupational  History   Occupation: retired  Tobacco Use   Smoking status: Light Smoker    Years: 50.00    Types: Cigarettes   Smokeless tobacco: Never   Tobacco comments:    smokes 1-2 cigarettes per week.  Substance and Sexual Activity   Alcohol use: No    Alcohol/week: 0.0 standard drinks   Drug use: No   Sexual activity: Not on file  Other Topics Concern   Not on file  Social History Narrative   Not on file   Social Determinants of Health   Financial Resource Strain: Not on file  Food Insecurity: Not on file  Transportation Needs: Not on file  Physical Activity: Not on file  Stress: Not on file  Social Connections: Not on file  Intimate Partner Violence: Not on file    Outpatient Medications Prior to Visit  Medication Sig Dispense Refill   acetaminophen (TYLENOL) 325 MG tablet Take by mouth.     albuterol (VENTOLIN HFA) 108 (90 Base) MCG/ACT inhaler Inhale 2 puffs into the lungs every 6 (six) hours as needed.      citalopram (CELEXA) 20 MG tablet Take 1 tablet (20 mg total) by mouth daily. 90 tablet 1   clopidogrel (PLAVIX) 75 MG tablet Take 1  tablet (75 mg total) by mouth daily. 90 tablet 1   diclofenac Sodium (VOLTAREN) 1 % GEL Apply 2 g topically 4 (four) times daily as needed.      dicyclomine (BENTYL) 10 MG capsule TAKE 1 CAPSULE BY MOUTH 4 TIMES DAILY BEFORE MEAL(S) AND AT BEDTIME 360 capsule 1   Fluticasone-Umeclidin-Vilant (TRELEGY ELLIPTA) 100-62.5-25 MCG/INH AEPB INHALE 1 PUFF ONCE DAILY USE AT THE SAME TIME EACH DAY 1 each 1   furosemide (LASIX) 40 MG tablet Take 2 tablets by mouth twice daily 360 tablet 0   gabapentin (NEURONTIN) 800 MG tablet Take 1 tablet (800 mg total) by mouth 3 (three) times daily. 270 tablet 1   hydrALAZINE (APRESOLINE) 25 MG tablet Take 25 mg by mouth as needed (DAILY if blood pressure is 180 or greater). Take one daily if blood pressure is 180 or greater     lidocaine (LIDODERM) 5 % Place 3 patches onto the skin daily. Remove & Discard patch within  12 hours or as directed by MD 90 patch 3   meclizine (ANTIVERT) 25 MG tablet Take 25 mg by mouth 3 (three) times daily as needed.      mirtazapine (REMERON) 15 MG tablet Take 1 tablet (15 mg total) by mouth at bedtime. 30 tablet 3   morphine (MS CONTIN) 30 MG 12 hr tablet Take 1 tablet (30 mg total) by mouth in the morning, at noon, and at bedtime. 90 tablet 0   nitroGLYCERIN (NITROSTAT) 0.4 MG SL tablet Place under the tongue.     ondansetron (ZOFRAN-ODT) 4 MG disintegrating tablet DISSOLVE 1 TO 2 TABLETS IN MOUTH EVERY 8 HOURS AS NEEDED FOR NAUSEA 40 tablet 0   pantoprazole (PROTONIX) 40 MG tablet Take 1 tablet (40 mg total) by mouth daily. 90 tablet 1   phenazopyridine (PYRIDIUM) 200 MG tablet Take 1 tablet (200 mg total) by mouth 3 (three) times daily as needed for pain. 10 tablet 0   Potassium Chloride ER 20 MEQ TBCR Take 1 tablet by mouth 2 (two) times daily with a meal. 180 tablet 1   pramipexole (MIRAPEX) 0.25 MG tablet Take 1 tablet (0.25 mg total) by mouth at bedtime. 30 tablet 2   pravastatin (PRAVACHOL) 40 MG tablet Take 1 tablet (40 mg total) by mouth daily. 90 tablet 1   sulfamethoxazole-trimethoprim (BACTRIM DS) 800-160 MG tablet Take 1 tablet by mouth 2 (two) times daily. 14 tablet 0   No facility-administered medications prior to visit.    Allergies  Allergen Reactions   Codeine    Methocarbamol    Sertraline     Insomnia    Tizanidine     Review of Systems  Constitutional:  Negative for appetite change, fatigue and fever.  HENT:  Negative for congestion, ear pain, sinus pressure and sore throat.   Eyes:  Negative for pain.  Respiratory:  Negative for cough, chest tightness, shortness of breath and wheezing.   Cardiovascular:  Negative for chest pain and palpitations.  Gastrointestinal:  Negative for abdominal pain, constipation, diarrhea, nausea and vomiting.  Endocrine: Negative.   Genitourinary:  Positive for decreased urine volume, difficulty urinating  (hesitancy when attempting to void), dysuria and pelvic pain. Negative for flank pain, frequency, hematuria, vaginal discharge and vaginal pain.  Musculoskeletal:  Positive for back pain (chronic) and gait problem. Negative for arthralgias, joint swelling and myalgias.  Skin:  Negative for rash.  Allergic/Immunologic: Negative.   Neurological:  Negative for dizziness, weakness and headaches.  Psychiatric/Behavioral:  Negative for dysphoric  mood. The patient is not nervous/anxious.       Objective:    Physical Exam Vitals reviewed.  Constitutional:      Appearance: Normal appearance.  Abdominal:     General: Bowel sounds are normal.     Palpations: Abdomen is soft.     Tenderness: There is no abdominal tenderness. There is no right CVA tenderness, left CVA tenderness or guarding.  Skin:    General: Skin is warm and dry.     Capillary Refill: Capillary refill takes less than 2 seconds.  Neurological:     General: No focal deficit present.     Mental Status: She is alert and oriented to person, place, and time.  Psychiatric:        Mood and Affect: Mood normal.        Behavior: Behavior normal.    BP 128/62 (BP Location: Left Arm, Patient Position: Sitting)   Pulse 88   Temp 97.8 F (36.6 C) (Temporal)   Ht $R'5\' 3"'TW$  (1.6 m)   Wt 120 lb (54.4 kg)   SpO2 95%   BMI 21.26 kg/m   Wt Readings from Last 3 Encounters:  10/23/20 128 lb (58.1 kg)  09/16/20 121 lb (54.9 kg)  09/06/20 126 lb (57.2 kg)    Health Maintenance Due  Topic Date Due   OPHTHALMOLOGY EXAM  Never done   Hepatitis C Screening  Never done   TETANUS/TDAP  Never done   COVID-19 Vaccine (4 - Booster for Pfizer series) 03/28/2020   INFLUENZA VACCINE  11/04/2020    Lab Results  Component Value Date   TSH 0.750 09/06/2020   Lab Results  Component Value Date   WBC 9.4 07/29/2020   HGB 14.3 07/29/2020   HCT 42.1 07/29/2020   MCV 97 07/29/2020   PLT 227 07/29/2020   Lab Results  Component Value Date   NA  143 07/29/2020   K 4.6 07/29/2020   CO2 29 07/29/2020   GLUCOSE 170 (H) 07/29/2020   BUN 27 07/29/2020   CREATININE 0.97 07/29/2020   BILITOT 0.2 07/29/2020   ALKPHOS 80 07/29/2020   AST 23 07/29/2020   ALT 17 07/29/2020   PROT 6.8 07/29/2020   ALBUMIN 4.2 07/29/2020   CALCIUM 9.2 07/29/2020   EGFR 61 07/29/2020   Lab Results  Component Value Date   CHOL 141 06/27/2020   Lab Results  Component Value Date   HDL 40 06/27/2020   Lab Results  Component Value Date   LDLCALC 88 06/27/2020   Lab Results  Component Value Date   TRIG 64 06/27/2020   Lab Results  Component Value Date   CHOLHDL 3.5 06/27/2020   Lab Results  Component Value Date   HGBA1C 5.5 06/27/2020         Assessment & Plan:   1. Recurrent UTI - nitrofurantoin, macrocrystal-monohydrate, (MACROBID) 100 MG capsule; Take 1 capsule (100 mg total) by mouth 2 (two) times daily.  Dispense: 20 capsule; Refill: 0 - POCT urinalysis dipstick  2. Acute cystitis without hematuria - Urine Culture   Take Macrobid twice daily for 10 days Push fluids, especially water Empty bladder every 2-3 hours while awake Use Dove soap for bathing Consider referral to urology   I,Lauren Peterson [place] as a scribe for Rip Harbour, NP.,have documented all relevant documentation on the behalf of Rip Harbour, NP,as directed by  Rip Harbour, NP while in the presence of Rip Harbour, NP.   Estanislado Pandy  Montez Morita, NP, have reviewed all documentation for this visit. The documentation on 11/12/20 for the exam, diagnosis, procedures, and orders are all accurate and complete.    Signed,  [name], DNP 11/12/20 at 11:01 am

## 2020-11-13 LAB — URINE CULTURE

## 2020-11-14 ENCOUNTER — Telehealth: Payer: Self-pay

## 2020-11-14 MED ORDER — CIPROFLOXACIN HCL 250 MG PO TABS: 250.0000 mg | ORAL_TABLET | Freq: Two times a day (BID) | ORAL | 0 refills | Status: DC

## 2020-11-14 NOTE — Telephone Encounter (Signed)
Sharyn Lull called to report that her Mom was unable to tolerate the Macrobid.  She started with nausea and vomiting after 2 days of the antibiotic.  She stopped the macrobid and felt better.  Dr. Tobie Poet advised cipro 250 mg twice daily for 5 days.

## 2020-11-19 ENCOUNTER — Other Ambulatory Visit: Payer: Self-pay

## 2020-11-19 ENCOUNTER — Ambulatory Visit (INDEPENDENT_AMBULATORY_CARE_PROVIDER_SITE_OTHER): Payer: Medicare Other | Admitting: Family Medicine

## 2020-11-19 ENCOUNTER — Telehealth: Payer: Self-pay

## 2020-11-19 ENCOUNTER — Encounter: Payer: Self-pay | Admitting: Family Medicine

## 2020-11-19 VITALS — BP 122/64 | HR 79 | Temp 95.6°F | Ht 63.0 in | Wt 118.0 lb

## 2020-11-19 DIAGNOSIS — N390 Urinary tract infection, site not specified: Secondary | ICD-10-CM

## 2020-11-19 DIAGNOSIS — I110 Hypertensive heart disease with heart failure: Secondary | ICD-10-CM | POA: Diagnosis not present

## 2020-11-19 DIAGNOSIS — E782 Mixed hyperlipidemia: Secondary | ICD-10-CM

## 2020-11-19 DIAGNOSIS — F1120 Opioid dependence, uncomplicated: Secondary | ICD-10-CM

## 2020-11-19 DIAGNOSIS — G894 Chronic pain syndrome: Secondary | ICD-10-CM | POA: Diagnosis not present

## 2020-11-19 LAB — POCT URINALYSIS DIP (CLINITEK)
Bilirubin, UA: NEGATIVE
Blood, UA: NEGATIVE
Glucose, UA: NEGATIVE mg/dL
Ketones, POC UA: NEGATIVE mg/dL
Nitrite, UA: NEGATIVE
Spec Grav, UA: 1.025 (ref 1.010–1.025)
Urobilinogen, UA: 0.2 E.U./dL
pH, UA: 6 (ref 5.0–8.0)

## 2020-11-19 MED ORDER — CLOPIDOGREL BISULFATE 75 MG PO TABS: 75.0000 mg | ORAL_TABLET | Freq: Every day | ORAL | 1 refills | Status: DC

## 2020-11-19 MED ORDER — PRAVASTATIN SODIUM 40 MG PO TABS: 40.0000 mg | ORAL_TABLET | Freq: Every day | ORAL | 1 refills | Status: DC

## 2020-11-19 NOTE — Progress Notes (Signed)
Subjective:  Patient ID: Donna Little, female    DOB: 02-29-1946  Age: 75 y.o. MRN: [id number]  Chief Complaint  Patient presents with   Hyperlipidemia   Hypertension   Hypertensive heart disease with heart failure [facility])  She takes Lasix 40 mg bid and Hydralazine 25 mg prn. She reports she has been attempting to eat healthier but has not had much of an appetite.  Mixed hyperlipidemia  She takes Pravachol 40 mg daily and has been attempting to eat healthier BMI 20.90 Recurrent UTI She is currently on Cipro 250 mg bid and has also been seen by urology and states she has done everything they have told her to do, such as only doing showers instead of tub baths and making sure she is emptying her bladder completely.  COPD: On trelegy one inhalation daily. On albuterol HFA 2 puffs q6 hrs prn.  Chronic Back Pain: On gabapentin 800 mg one three times a day, Mscontin 30 mg one three times a day. Pain is 8/10 at rest and 10/10 with activity. Using lidocaine otc patches which are helping. Rx patches were $180.00.  Depression is fairly well controlled with celexa 20 mg daily.  PHQ9 SCORE ONLY 11/19/2020 10/23/2020 05/30/2020  PHQ-9 Total Score '8 1 18     '$ Current Outpatient Medications on File Prior to Visit  Medication Sig Dispense Refill   acetaminophen (TYLENOL) 325 MG tablet Take by mouth.     albuterol (VENTOLIN HFA) 108 (90 Base) MCG/ACT inhaler Inhale 2 puffs into the lungs every 6 (six) hours as needed.      ciprofloxacin (CIPRO) 250 MG tablet Take 1 tablet (250 mg total) by mouth 2 (two) times daily. 10 tablet 0   citalopram (CELEXA) 20 MG tablet Take 1 tablet (20 mg total) by mouth daily. 90 tablet 1   clopidogrel (PLAVIX) 75 MG tablet Take 1 tablet (75 mg total) by mouth daily. 90 tablet 1   diclofenac Sodium (VOLTAREN) 1 % GEL Apply 2 g topically 4 (four) times daily as needed.      dicyclomine (BENTYL) 10 MG capsule TAKE 1 CAPSULE BY MOUTH 4 TIMES DAILY BEFORE MEAL(S) AND AT BEDTIME  360 capsule 1   Fluticasone-Umeclidin-Vilant (TRELEGY ELLIPTA) 100-62.5-25 MCG/INH AEPB INHALE 1 PUFF ONCE DAILY USE AT THE SAME TIME EACH DAY 1 each 1   furosemide (LASIX) 40 MG tablet Take 2 tablets by mouth twice daily 360 tablet 0   gabapentin (NEURONTIN) 800 MG tablet Take 1 tablet (800 mg total) by mouth 3 (three) times daily. 270 tablet 1   hydrALAZINE (APRESOLINE) 25 MG tablet Take 25 mg by mouth as needed (DAILY if blood pressure is 180 or greater). Take one daily if blood pressure is 180 or greater     lidocaine (LIDODERM) 5 % Place 3 patches onto the skin daily. Remove & Discard patch within 12 hours or as directed by MD 90 patch 3   meclizine (ANTIVERT) 25 MG tablet Take 25 mg by mouth 3 (three) times daily as needed.      mirtazapine (REMERON) 15 MG tablet Take 1 tablet (15 mg total) by mouth at bedtime. 30 tablet 3   morphine (MS CONTIN) 30 MG 12 hr tablet Take 1 tablet (30 mg total) by mouth in the morning, at noon, and at bedtime. 90 tablet 0   nitroGLYCERIN (NITROSTAT) 0.4 MG SL tablet Place under the tongue.     ondansetron (ZOFRAN-ODT) 4 MG disintegrating tablet DISSOLVE 1 TO 2 TABLETS IN MOUTH  EVERY 8 HOURS AS NEEDED FOR NAUSEA 40 tablet 0   pantoprazole (PROTONIX) 40 MG tablet Take 1 tablet (40 mg total) by mouth daily. 90 tablet 1   phenazopyridine (PYRIDIUM) 200 MG tablet Take 1 tablet (200 mg total) by mouth 3 (three) times daily as needed for pain. 10 tablet 0   Potassium Chloride ER 20 MEQ TBCR Take 1 tablet by mouth 2 (two) times daily with a meal. 180 tablet 1   pramipexole (MIRAPEX) 0.25 MG tablet Take 1 tablet (0.25 mg total) by mouth at bedtime. 30 tablet 2   pravastatin (PRAVACHOL) 40 MG tablet Take 1 tablet (40 mg total) by mouth daily. 90 tablet 1   No current facility-administered medications on file prior to visit.   Past Medical History:  Diagnosis Date   A-fib [facility])    Atherosclerosis of both carotid arteries 2022   noted per CT on 05/14/20   Chronic pain  syndrome    Chronic systolic heart failure (HCC)    Depression    Emphysema of lung ([place]) 2022   noted per CT scan on 05/14/20   Generalized atherosclerosis    GERD (gastroesophageal reflux disease)    Hypertension    IBS (irritable bowel syndrome)    Melanoma (HCC)    Other specified nontoxic goiter    Palpitations    Polymyalgia rheumatica (HCC)    Primary pulmonary hypertension ([place])    Pulmonary embolism (HCC)    Stroke ([place])    ministroke   Supraventricular tachycardia ([place])    Type 2 diabetes mellitus ([place])    Past Surgical History:  Procedure Laterality Date   ABDOMINAL HYSTERECTOMY     APPENDECTOMY     CATARACT EXTRACTION     CORONARY ANGIOPLASTY WITH STENT PLACEMENT     KYPHOPLASTY  05/2020   L4-L5    Family History  Problem Relation Age of Onset   Diabetes Other    Stroke Other    Heart disease Other    Social History   Socioeconomic History   Marital status: Single    Spouse name: Not on file   Number of children: 2   Years of education: Not on file   Highest education level: Not on file  Occupational History   Occupation: retired  Tobacco Use   Smoking status: Light Smoker    Years: 50.00    Types: Cigarettes   Smokeless tobacco: Never   Tobacco comments:    smokes 1-2 cigarettes per week.  Substance and Sexual Activity   Alcohol use: No    Alcohol/week: 0.0 standard drinks   Drug use: No   Sexual activity: Not on file  Other Topics Concern   Not on file  Social History Narrative   Not on file   Social Determinants of Health   Financial Resource Strain: Not on file  Food Insecurity: Not on file  Transportation Needs: Not on file  Physical Activity: Not on file  Stress: Not on file  Social Connections: Not on file    Review of Systems  Constitutional:  Negative for chills, fatigue and fever.  HENT:  Negative for congestion, ear pain, rhinorrhea and sore throat.   Respiratory:  Negative for cough and shortness of breath.    Cardiovascular:  Negative for chest pain.  Gastrointestinal:  Negative for abdominal pain, constipation, diarrhea, nausea and vomiting.  Genitourinary:  Positive for difficulty urinating. Negative for dysuria and urgency.  Musculoskeletal:  Negative for back pain and myalgias.  Skin:  Negative  for rash.  Neurological:  Positive for headaches. Negative for dizziness, weakness and light-headedness.  Psychiatric/Behavioral:  Negative for dysphoric mood (Controlled with medications). The patient is not nervous/anxious.     Objective:  BP 122/64   Pulse 79   Temp (!) 95.6 F (35.3 C)   Ht '5\' 3"'$  (1.6 m)   Wt 118 lb (53.5 kg)   SpO2 95%   BMI 20.90 kg/m   BP/Weight 11/19/2020 11/12/2020 [id number]  Systolic BP 123XX123 [id number]  Diastolic BP 64 62 60  Wt. (Lbs) 118 120 128  BMI 20.9 21.26 22.67    Physical Exam Vitals reviewed.  Constitutional:      Appearance: Normal appearance.  HENT:     Head: Normocephalic.     Right Ear: Tympanic membrane, ear canal and external ear normal.     Left Ear: Tympanic membrane, ear canal and external ear normal.     Nose: Nose normal.     Mouth/Throat:     Mouth: Mucous membranes are moist.     Pharynx: Oropharynx is clear.  Neck:     Vascular: No carotid bruit.  Cardiovascular:     Rate and Rhythm: Normal rate and regular rhythm.     Pulses: Normal pulses.     Heart sounds: Normal heart sounds.  Pulmonary:     Effort: Pulmonary effort is normal.     Breath sounds: Normal breath sounds.  Abdominal:     General: Abdomen is flat. Bowel sounds are normal.     Palpations: Abdomen is soft.  Skin:    General: Skin is warm and dry.  Neurological:     General: No focal deficit present.     Mental Status: She is alert and oriented to person, place, and time.  Psychiatric:        Mood and Affect: Mood normal.        Behavior: Behavior normal.     Lab Results  Component Value Date   WBC 9.4 07/29/2020   HGB 14.3 07/29/2020   HCT 42.1  07/29/2020   PLT 227 07/29/2020   GLUCOSE 170 (H) 07/29/2020   CHOL 141 06/27/2020   TRIG 64 06/27/2020   HDL 40 06/27/2020   LDLCALC 88 06/27/2020   ALT 17 07/29/2020   AST 23 07/29/2020   NA 143 07/29/2020   K 4.6 07/29/2020   CL 97 07/29/2020   CREATININE 0.97 07/29/2020   BUN 27 07/29/2020   CO2 29 07/29/2020   TSH 0.750 09/06/2020   HGBA1C 5.5 06/27/2020   MICROALBUR 30 01/20/2020    Assessment & Plan:  1. Hypertensive heart disease with heart failure (HCC) - Controlled. - CBC with Differential/Platelet - Comprehensive metabolic panel - Continue Lasix 40 mg bid - Continue Hydralazine 25 mg prn if blood pressure is 180 or greater  2. Mixed hyperlipidemia - Well controlled - Lipid panel - Continue Pravachol 40 mg daily - Continue attempting to eat healthy  3. Recurrent UTI - POCT URINALYSIS DIP (CLINITEK) - Urine Culture  Refer to urology.   4. Chronic pain syndrome/Opioid dependence Pain is uncontrolled. I discussed with Sherre Poot, PHD about trying to get rx lidoderm patches at lesser cost.    Follow-up: Return in about 3 months (around 02/19/2021) for fasting.  An After Visit Summary was printed and given to the patient.  [name], MD [facility] [phone]

## 2020-11-19 NOTE — Chronic Care Management (AMB) (Signed)
Chronic Care Management Pharmacy Assistant   Name: Donna Little  MRN: [id number] DOB: Aug 02, 1945  Reason for Encounter: Prior Authorization   11/19/2020- Prior authorization worked on for Lidocaine 5% patches through [employer]. Unable to find form that will apply to patient prior authorization for Lidocaine patches through covermymeds.   11/22/2020- [place] to check status of Lidocaine and who to contact for prior authorization. Spoke with Erline Levine, she stated med was still due for prior authorization from [facility] [phone], but she also mentioned that there is an OTC Lidocaine patch 4% available.  Tried Covermymeds again using the [employer] form for Medicare, it requested patient ID, Called patient to see if she is using an [facility] card and patient informed me that she does use [employer] for prescriptions, her ID number [id number]. Form completed and sent to [facility] plan, awaiting determination.   11/26/2020- Checked Covermymeds, Lidocaine patches were approved until 04/05/2021, called patient to inform, she is aware she can go to the pharmacy to pick up, [facility] will need to reprocess and medication will go through. Donette Larry, Pharm D. Notified.   Medications: Outpatient Encounter Medications as of 11/19/2020  Medication Sig   acetaminophen (TYLENOL) 325 MG tablet Take by mouth.   albuterol (VENTOLIN HFA) 108 (90 Base) MCG/ACT inhaler Inhale 2 puffs into the lungs every 6 (six) hours as needed.    ciprofloxacin (CIPRO) 250 MG tablet Take 1 tablet (250 mg total) by mouth 2 (two) times daily.   citalopram (CELEXA) 20 MG tablet Take 1 tablet (20 mg total) by mouth daily.   clopidogrel (PLAVIX) 75 MG tablet Take 1 tablet (75 mg total) by mouth daily.   diclofenac Sodium (VOLTAREN) 1 % GEL Apply 2 g topically 4 (four) times daily as needed.    dicyclomine (BENTYL) 10 MG capsule TAKE 1 CAPSULE BY MOUTH 4 TIMES DAILY BEFORE MEAL(S) AND AT BEDTIME    Fluticasone-Umeclidin-Vilant (TRELEGY ELLIPTA) 100-62.5-25 MCG/INH AEPB INHALE 1 PUFF ONCE DAILY USE AT THE SAME TIME EACH DAY   furosemide (LASIX) 40 MG tablet Take 2 tablets by mouth twice daily   gabapentin (NEURONTIN) 800 MG tablet Take 1 tablet (800 mg total) by mouth 3 (three) times daily.   hydrALAZINE (APRESOLINE) 25 MG tablet Take 25 mg by mouth as needed (DAILY if blood pressure is 180 or greater). Take one daily if blood pressure is 180 or greater   lidocaine (LIDODERM) 5 % Place 3 patches onto the skin daily. Remove & Discard patch within 12 hours or as directed by MD   meclizine (ANTIVERT) 25 MG tablet Take 25 mg by mouth 3 (three) times daily as needed.    mirtazapine (REMERON) 15 MG tablet Take 1 tablet (15 mg total) by mouth at bedtime.   morphine (MS CONTIN) 30 MG 12 hr tablet Take 1 tablet (30 mg total) by mouth in the morning, at noon, and at bedtime.   nitroGLYCERIN (NITROSTAT) 0.4 MG SL tablet Place under the tongue.   ondansetron (ZOFRAN-ODT) 4 MG disintegrating tablet DISSOLVE 1 TO 2 TABLETS IN MOUTH EVERY 8 HOURS AS NEEDED FOR NAUSEA   pantoprazole (PROTONIX) 40 MG tablet Take 1 tablet (40 mg total) by mouth daily.   phenazopyridine (PYRIDIUM) 200 MG tablet Take 1 tablet (200 mg total) by mouth 3 (three) times daily as needed for pain.   Potassium Chloride ER 20 MEQ TBCR Take 1 tablet by mouth 2 (two) times daily with a meal.   pramipexole (MIRAPEX) 0.25 MG tablet  Take 1 tablet (0.25 mg total) by mouth at bedtime.   pravastatin (PRAVACHOL) 40 MG tablet Take 1 tablet (40 mg total) by mouth daily.   No facility-administered encounter medications on file as of 11/19/2020.    Care Gaps: OPHTHALMOLOGY EXAM- Overdue - never done  Hepatitis C Screening (Once) - Never done TETANUS/TDAP (Every 10 Years)- Never done INFLUENZA VACCINE (Every 8 Months, August to March)- Last completed: Dec 20, 2019  Annual Wellness Exam completed on 03/18/2020. Follow up visit scheduled with CCM  Pharmacist Donette Larry on 12/16/2020 at 11:15 am.  Star Rating Drugs: Pravastatin 40 mg- Last filled 07/09/2020 for a 90 day supply.  Pattricia Boss, [place] Pharmacist Assistant [phone]

## 2020-11-19 NOTE — Progress Notes (Deleted)
Duplicate

## 2020-11-20 LAB — CBC WITH DIFFERENTIAL/PLATELET
Basophils Absolute: 0.1 10*3/uL (ref 0.0–0.2)
Basos: 1 %
EOS (ABSOLUTE): 0.6 10*3/uL — ABNORMAL HIGH (ref 0.0–0.4)
Eos: 8 %
Hematocrit: 46.9 % — ABNORMAL HIGH (ref 34.0–46.6)
Hemoglobin: 15.4 g/dL (ref 11.1–15.9)
Immature Grans (Abs): 0 10*3/uL (ref 0.0–0.1)
Immature Granulocytes: 0 %
Lymphocytes Absolute: 2 10*3/uL (ref 0.7–3.1)
Lymphs: 28 %
MCH: 30.6 pg (ref 26.6–33.0)
MCHC: 32.8 g/dL (ref 31.5–35.7)
MCV: 93 fL (ref 79–97)
Monocytes Absolute: 0.7 10*3/uL (ref 0.1–0.9)
Monocytes: 10 %
Neutrophils Absolute: 3.8 10*3/uL (ref 1.4–7.0)
Neutrophils: 53 %
Platelets: 253 10*3/uL (ref 150–450)
RBC: 5.04 x10E6/uL (ref 3.77–5.28)
RDW: 13.3 % (ref 11.7–15.4)
WBC: 7.1 10*3/uL (ref 3.4–10.8)

## 2020-11-20 LAB — COMPREHENSIVE METABOLIC PANEL
ALT: 10 IU/L (ref 0–32)
AST: 17 IU/L (ref 0–40)
Albumin/Globulin Ratio: 2.5 — ABNORMAL HIGH (ref 1.2–2.2)
Albumin: 4.7 g/dL (ref 3.7–4.7)
Alkaline Phosphatase: 64 IU/L (ref 44–121)
BUN/Creatinine Ratio: 15 (ref 12–28)
BUN: 13 mg/dL (ref 8–27)
Bilirubin Total: 0.3 mg/dL (ref 0.0–1.2)
CO2: 28 mmol/L (ref 20–29)
Calcium: 9.8 mg/dL (ref 8.7–10.3)
Chloride: 96 mmol/L (ref 96–106)
Creatinine, Ser: 0.89 mg/dL (ref 0.57–1.00)
Globulin, Total: 1.9 g/dL (ref 1.5–4.5)
Glucose: 102 mg/dL — ABNORMAL HIGH (ref 65–99)
Potassium: 4.6 mmol/L (ref 3.5–5.2)
Sodium: 138 mmol/L (ref 134–144)
Total Protein: 6.6 g/dL (ref 6.0–8.5)
eGFR: 67 mL/min/{1.73_m2} (ref >59)

## 2020-11-20 LAB — LIPID PANEL
Chol/HDL Ratio: 3.8 ratio (ref 0.0–4.4)
Cholesterol, Total: 145 mg/dL (ref 100–199)
HDL: 38 mg/dL — ABNORMAL LOW (ref >39)
LDL Chol Calc (NIH): 82 mg/dL (ref 0–99)
Triglycerides: 139 mg/dL (ref 0–149)
VLDL Cholesterol Cal: 25 mg/dL (ref 5–40)

## 2020-11-20 LAB — URINE CULTURE

## 2020-11-20 LAB — CARDIOVASCULAR RISK ASSESSMENT

## 2020-11-24 ENCOUNTER — Other Ambulatory Visit: Payer: Self-pay | Admitting: Family Medicine

## 2020-11-24 DIAGNOSIS — N390 Urinary tract infection, site not specified: Secondary | ICD-10-CM

## 2020-11-27 ENCOUNTER — Ambulatory Visit (INDEPENDENT_AMBULATORY_CARE_PROVIDER_SITE_OTHER): Payer: Medicare Other | Admitting: Family Medicine

## 2020-11-27 ENCOUNTER — Other Ambulatory Visit: Payer: Self-pay

## 2020-11-27 ENCOUNTER — Encounter: Payer: Self-pay | Admitting: Family Medicine

## 2020-11-27 VITALS — BP 110/40 | HR 60 | Temp 97.2°F | Resp 16 | Ht 63.0 in | Wt 121.0 lb

## 2020-11-27 DIAGNOSIS — N390 Urinary tract infection, site not specified: Secondary | ICD-10-CM | POA: Diagnosis not present

## 2020-11-27 LAB — POCT URINALYSIS DIPSTICK
Bilirubin, UA: NEGATIVE
Blood, UA: NEGATIVE
Glucose, UA: NEGATIVE
Ketones, UA: NEGATIVE
Nitrite, UA: NEGATIVE
Protein, UA: POSITIVE — AB
Spec Grav, UA: 1.015 (ref 1.010–1.025)
Urobilinogen, UA: 0.2 E.U./dL
pH, UA: 6 (ref 5.0–8.0)

## 2020-11-27 MED ORDER — CEFTRIAXONE SODIUM 1 G IJ SOLR
1.0000 g | Freq: Once | INTRAMUSCULAR | Status: AC
  Administered 2020-11-27: 1 g via INTRAMUSCULAR

## 2020-11-27 MED ORDER — CIPROFLOXACIN HCL 250 MG PO TABS: 250.0000 mg | ORAL_TABLET | Freq: Two times a day (BID) | ORAL | 0 refills | Status: AC

## 2020-11-27 NOTE — Patient Instructions (Signed)
Cipro sent.  Rocephin shot given.  Referral sent. Waiting for appointment to be made.

## 2020-11-27 NOTE — Progress Notes (Signed)
Acute Office Visit  Subjective:    Patient ID: Donna Little, female    DOB: [birth year], 75 y.o.   MRN: [id number]  Chief Complaint  Patient presents with   Abdominal Pain   difficulty voiding   Vaginal Discharge    HPI Patient is in today for UTI c/o vaginal discharge, difficulty urinating- no dysuria, no vaginal itching, some lower pelvic discomfort. Pt has had 5 UTIs over the last 6 months although only about half had showed significant bacterial growth to truly be a UTI. Vaginal discharge was for 3 days and then resolved.   Past Medical History:  Diagnosis Date   A-fib [facility])    Atherosclerosis of both carotid arteries 2022   noted per CT on 05/14/20   Chronic pain syndrome    Chronic systolic heart failure (HCC)    Depression    Emphysema of lung ([place]) 2022   noted per CT scan on 05/14/20   Generalized atherosclerosis    GERD (gastroesophageal reflux disease)    Hypertension    IBS (irritable bowel syndrome)    Melanoma (HCC)    Other specified nontoxic goiter    Palpitations    Polymyalgia rheumatica (HCC)    Primary pulmonary hypertension ([place])    Pulmonary embolism (HCC)    Stroke ([place])    ministroke   Supraventricular tachycardia ([place])    Type 2 diabetes mellitus ([place])     Past Surgical History:  Procedure Laterality Date   ABDOMINAL HYSTERECTOMY     APPENDECTOMY     CATARACT EXTRACTION     CORONARY ANGIOPLASTY WITH STENT PLACEMENT     KYPHOPLASTY  05/2020   L4-L5    Family History  Problem Relation Age of Onset   Diabetes Other    Stroke Other    Heart disease Other     Social History   Socioeconomic History   Marital status: Single    Spouse name: Not on file   Number of children: 2   Years of education: Not on file   Highest education level: Not on file  Occupational History   Occupation: retired  Tobacco Use   Smoking status: Light Smoker    Years: 50.00    Types: Cigarettes   Smokeless tobacco: Never   Tobacco comments:     smokes 1-2 cigarettes per week.  Substance and Sexual Activity   Alcohol use: No    Alcohol/week: 0.0 standard drinks   Drug use: No   Sexual activity: Not on file  Other Topics Concern   Not on file  Social History Narrative   Not on file   Social Determinants of Health   Financial Resource Strain: Not on file  Food Insecurity: Not on file  Transportation Needs: Not on file  Physical Activity: Not on file  Stress: Not on file  Social Connections: Not on file  Intimate Partner Violence: Not on file    Outpatient Medications Prior to Visit  Medication Sig Dispense Refill   acetaminophen (TYLENOL) 325 MG tablet Take by mouth.     albuterol (VENTOLIN HFA) 108 (90 Base) MCG/ACT inhaler Inhale 2 puffs into the lungs every 6 (six) hours as needed.      citalopram (CELEXA) 20 MG tablet Take 1 tablet (20 mg total) by mouth daily. 90 tablet 1   clopidogrel (PLAVIX) 75 MG tablet Take 1 tablet (75 mg total) by mouth daily. 90 tablet 1   diclofenac Sodium (VOLTAREN) 1 % GEL Apply 2 g topically 4 (  four) times daily as needed.      dicyclomine (BENTYL) 10 MG capsule TAKE 1 CAPSULE BY MOUTH 4 TIMES DAILY BEFORE MEAL(S) AND AT BEDTIME 360 capsule 1   Fluticasone-Umeclidin-Vilant (TRELEGY ELLIPTA) 100-62.5-25 MCG/INH AEPB INHALE 1 PUFF ONCE DAILY USE AT THE SAME TIME EACH DAY 1 each 1   furosemide (LASIX) 40 MG tablet Take 2 tablets by mouth twice daily 360 tablet 0   gabapentin (NEURONTIN) 800 MG tablet Take 1 tablet (800 mg total) by mouth 3 (three) times daily. 270 tablet 1   hydrALAZINE (APRESOLINE) 25 MG tablet Take 25 mg by mouth as needed (DAILY if blood pressure is 180 or greater). Take one daily if blood pressure is 180 or greater     lidocaine (LIDODERM) 5 % Place 3 patches onto the skin daily. Remove & Discard patch within 12 hours or as directed by MD 90 patch 3   meclizine (ANTIVERT) 25 MG tablet Take 25 mg by mouth 3 (three) times daily as needed.      mirtazapine (REMERON) 15 MG  tablet Take 1 tablet (15 mg total) by mouth at bedtime. 30 tablet 3   morphine (MS CONTIN) 30 MG 12 hr tablet Take 1 tablet (30 mg total) by mouth in the morning, at noon, and at bedtime. 90 tablet 0   nitroGLYCERIN (NITROSTAT) 0.4 MG SL tablet Place under the tongue.     ondansetron (ZOFRAN-ODT) 4 MG disintegrating tablet DISSOLVE 1 TO 2 TABLETS IN MOUTH EVERY 8 HOURS AS NEEDED FOR NAUSEA 40 tablet 0   pantoprazole (PROTONIX) 40 MG tablet Take 1 tablet (40 mg total) by mouth daily. 90 tablet 1   phenazopyridine (PYRIDIUM) 200 MG tablet Take 1 tablet (200 mg total) by mouth 3 (three) times daily as needed for pain. 10 tablet 0   Potassium Chloride ER 20 MEQ TBCR Take 1 tablet by mouth 2 (two) times daily with a meal. 180 tablet 1   pramipexole (MIRAPEX) 0.25 MG tablet Take 1 tablet (0.25 mg total) by mouth at bedtime. 30 tablet 2   pravastatin (PRAVACHOL) 40 MG tablet Take 1 tablet (40 mg total) by mouth daily. 90 tablet 1   ciprofloxacin (CIPRO) 250 MG tablet Take 1 tablet (250 mg total) by mouth 2 (two) times daily. 10 tablet 0   No facility-administered medications prior to visit.    Allergies  Allergen Reactions   Codeine    Macrobid [Nitrofurantoin] Nausea And Vomiting   Methocarbamol    Sertraline     Insomnia    Tizanidine     Review of Systems  Constitutional:  Positive for fatigue. Negative for chills and fever.  HENT:  Negative for congestion, rhinorrhea and sore throat.   Respiratory:  Negative for cough and shortness of breath.   Cardiovascular:  Negative for chest pain.  Gastrointestinal:  Positive for abdominal distention. Negative for abdominal pain, constipation, diarrhea, nausea and vomiting.  Genitourinary:  Positive for difficulty urinating and vaginal discharge. Negative for dysuria, hematuria and urgency.  Musculoskeletal:  Negative for back pain and myalgias.  Neurological:  Negative for dizziness, weakness, light-headedness and headaches.   Psychiatric/Behavioral:  Negative for dysphoric mood. The patient is not nervous/anxious.       Objective:    Physical Exam Vitals reviewed.  Constitutional:      Appearance: Normal appearance. She is normal weight.  Cardiovascular:     Rate and Rhythm: Normal rate and regular rhythm.     Heart sounds: Normal heart sounds.  Pulmonary:  Effort: Pulmonary effort is normal. No respiratory distress.     Breath sounds: Normal breath sounds.  Abdominal:     General: Abdomen is flat. Bowel sounds are normal.     Palpations: Abdomen is soft.     Tenderness: There is no abdominal tenderness.  Neurological:     Mental Status: She is alert and oriented to person, place, and time.  Psychiatric:        Mood and Affect: Mood normal.        Behavior: Behavior normal.    BP (!) 110/40   Pulse 60   Temp (!) 97.2 F (36.2 C)   Resp 16   Ht $R'5\' 3"'IX$  (1.6 m)   Wt 121 lb (54.9 kg)   BMI 21.43 kg/m  Wt Readings from Last 3 Encounters:  11/27/20 121 lb (54.9 kg)  11/19/20 118 lb (53.5 kg)  11/12/20 120 lb (54.4 kg)    Health Maintenance Due  Topic Date Due   OPHTHALMOLOGY EXAM  Never done   Hepatitis C Screening  Never done   TETANUS/TDAP  Never done   INFLUENZA VACCINE  11/04/2020    There are no preventive care reminders to display for this patient.   Lab Results  Component Value Date   TSH 0.750 09/06/2020   Lab Results  Component Value Date   WBC 7.1 11/19/2020   HGB 15.4 11/19/2020   HCT 46.9 (H) 11/19/2020   MCV 93 11/19/2020   PLT 253 11/19/2020   Lab Results  Component Value Date   NA 138 11/19/2020   K 4.6 11/19/2020   CO2 28 11/19/2020   GLUCOSE 102 (H) 11/19/2020   BUN 13 11/19/2020   CREATININE 0.89 11/19/2020   BILITOT 0.3 11/19/2020   ALKPHOS 64 11/19/2020   AST 17 11/19/2020   ALT 10 11/19/2020   PROT 6.6 11/19/2020   ALBUMIN 4.7 11/19/2020   CALCIUM 9.8 11/19/2020   EGFR 67 11/19/2020   Lab Results  Component Value Date   CHOL 145  11/19/2020   Lab Results  Component Value Date   HDL 38 (L) 11/19/2020   Lab Results  Component Value Date   LDLCALC 82 11/19/2020   Lab Results  Component Value Date   TRIG 139 11/19/2020   Lab Results  Component Value Date   CHOLHDL 3.8 11/19/2020   Lab Results  Component Value Date   HGBA1C 5.5 06/27/2020       Assessment & Plan:  1. Recurrent UTI Rx for cipro sent.  Referral to urology has been sent and is currently being worked on.  Rocephin 1 gm IM given.  - Urine Culture - POCT urinalysis dipstick - cefTRIAXone (ROCEPHIN) injection 1 g    Meds ordered this encounter  Medications   cefTRIAXone (ROCEPHIN) injection 1 g   ciprofloxacin (CIPRO) 250 MG tablet    Sig: Take 1 tablet (250 mg total) by mouth 2 (two) times daily for 7 days.    Dispense:  14 tablet    Refill:  0    Orders Placed This Encounter  Procedures   Urine Culture   POCT urinalysis dipstick     Follow-up: No follow-ups on file.  An After Visit Summary was printed and given to the patient.  [name], MD [facility] [phone]

## 2020-11-29 LAB — URINE CULTURE

## 2020-12-06 ENCOUNTER — Other Ambulatory Visit: Payer: Self-pay | Admitting: Family Medicine

## 2020-12-06 ENCOUNTER — Telehealth: Payer: Self-pay

## 2020-12-06 MED ORDER — SULFAMETHOXAZOLE-TRIMETHOPRIM 800-160 MG PO TABS: 1.0000 | ORAL_TABLET | Freq: Two times a day (BID) | ORAL | 0 refills | Status: DC

## 2020-12-06 NOTE — Telephone Encounter (Signed)
Pt left VM stating she tested urine @ home and it still shows traces of UTI.   Returned call to pt. Pt states she "feels like crap this morning." She tested urine this morning, strip showed positive but not as strong as previous home test. Denies frequency, burning, abd pain. Pt is drinking four 8 oz bottles of water and feels she is still not peeing as she should. She is requesting antibiotic.   Harrell Lark 12/06/20 12:23 PM

## 2020-12-08 NOTE — Telephone Encounter (Signed)
Sent antibiotic. kc

## 2020-12-16 ENCOUNTER — Telehealth: Payer: Medicare Other

## 2020-12-16 DIAGNOSIS — N952 Postmenopausal atrophic vaginitis: Secondary | ICD-10-CM | POA: Diagnosis not present

## 2020-12-16 DIAGNOSIS — N390 Urinary tract infection, site not specified: Secondary | ICD-10-CM | POA: Diagnosis not present

## 2020-12-16 DIAGNOSIS — R350 Frequency of micturition: Secondary | ICD-10-CM | POA: Diagnosis not present

## 2020-12-16 DIAGNOSIS — Z79899 Other long term (current) drug therapy: Secondary | ICD-10-CM | POA: Diagnosis not present

## 2020-12-20 ENCOUNTER — Other Ambulatory Visit: Payer: Self-pay

## 2020-12-20 MED ORDER — MORPHINE SULFATE ER 30 MG PO TBCR: 30.0000 mg | EXTENDED_RELEASE_TABLET | Freq: Three times a day (TID) | ORAL | 0 refills | Status: DC

## 2020-12-23 DIAGNOSIS — N390 Urinary tract infection, site not specified: Secondary | ICD-10-CM | POA: Diagnosis not present

## 2020-12-25 ENCOUNTER — Ambulatory Visit: Payer: Medicare Other

## 2021-01-02 ENCOUNTER — Other Ambulatory Visit: Payer: Self-pay

## 2021-01-02 ENCOUNTER — Ambulatory Visit (INDEPENDENT_AMBULATORY_CARE_PROVIDER_SITE_OTHER): Payer: Medicare Other

## 2021-01-02 ENCOUNTER — Encounter: Payer: Self-pay | Admitting: Family Medicine

## 2021-01-02 DIAGNOSIS — Z23 Encounter for immunization: Secondary | ICD-10-CM

## 2021-01-02 DIAGNOSIS — N390 Urinary tract infection, site not specified: Secondary | ICD-10-CM | POA: Diagnosis not present

## 2021-01-02 MED ORDER — PANTOPRAZOLE SODIUM 40 MG PO TBEC: 40.0000 mg | DELAYED_RELEASE_TABLET | Freq: Every day | ORAL | 1 refills | Status: DC

## 2021-01-02 MED ORDER — CITALOPRAM HYDROBROMIDE 20 MG PO TABS: 20.0000 mg | ORAL_TABLET | Freq: Every day | ORAL | 1 refills | Status: DC

## 2021-01-02 NOTE — Patient Instructions (Signed)
Flu shot given in left deltoid

## 2021-01-10 ENCOUNTER — Telehealth: Payer: Self-pay

## 2021-01-10 NOTE — Chronic Care Management (AMB) (Signed)
Chronic Care Management Pharmacy Assistant   Name: Donna Little  MRN: [id number] DOB: Nov 11, 1945   Reason for Encounter: Disease State/ COPD  Recent office visits:  09-16-2020 Rip Harbour, NP. Leuko= large. START pyridium 200 mg 3 times daily as needed. START bactrim 800-160 mg twice daily for 1 week. CT of abdomen pelvis placed.  09-24-2020 [name], MD. Follow up.  10-23-2020 [name], MD. Referral placed to neurology. START lidocaine patches place 3 patches into skin daily discard patch within 12 hours.  11-12-2020 Rip Harbour, NP. START nitrofurantoin 100 mg twice daily for 10 days. Large leukocytes in UA.  11-19-2020 [name], MD. Completed bactrim. Hema= 46.9, EOS= 0.6. Glucose= 102, Albumin/globulin= 2.5. HDL= 38. Small leukocytes in UA.  11-27-2020 [name], MD. Positive protein in UA, small leukocytes in UA. Rocephin injection given.  01-02-2021 Billie Lade, CMA. Flu vaccine given  Recent consult visits:  09-18-2020 Miles Costain, MD (Vascular). Appointment for CT.  09-18-2020 Cheek, Gayleen Orem, MD (Cardiology). CT of abdomen.  Hospital visits:  Medication Reconciliation was completed by comparing discharge summary, patient's EMR and Pharmacy list, and upon discussion with patient.  Admitted to the hospital on 10-11-2020 due to Cerebral atherosclerosis Discharge date was 10-11-2020. CT of brain procedure.Unable to view rest of encounter.  Medications: Outpatient Encounter Medications as of 01/10/2021  Medication Sig   acetaminophen (TYLENOL) 325 MG tablet Take by mouth.   albuterol (VENTOLIN HFA) 108 (90 Base) MCG/ACT inhaler Inhale 2 puffs into the lungs every 6 (six) hours as needed.    citalopram (CELEXA) 20 MG tablet Take 1 tablet (20 mg total) by mouth daily.   clopidogrel (PLAVIX) 75 MG tablet Take 1 tablet (75 mg total) by mouth daily.   diclofenac Sodium (VOLTAREN) 1 % GEL Apply 2 g topically 4 (four) times  daily as needed.    dicyclomine (BENTYL) 10 MG capsule TAKE 1 CAPSULE BY MOUTH 4 TIMES DAILY BEFORE MEAL(S) AND AT BEDTIME   Fluticasone-Umeclidin-Vilant (TRELEGY ELLIPTA) 100-62.5-25 MCG/INH AEPB INHALE 1 PUFF ONCE DAILY USE AT THE SAME TIME EACH DAY   furosemide (LASIX) 40 MG tablet Take 2 tablets by mouth twice daily   gabapentin (NEURONTIN) 800 MG tablet Take 1 tablet (800 mg total) by mouth 3 (three) times daily.   hydrALAZINE (APRESOLINE) 25 MG tablet Take 25 mg by mouth as needed (DAILY if blood pressure is 180 or greater). Take one daily if blood pressure is 180 or greater   lidocaine (LIDODERM) 5 % Place 3 patches onto the skin daily. Remove & Discard patch within 12 hours or as directed by MD   meclizine (ANTIVERT) 25 MG tablet Take 25 mg by mouth 3 (three) times daily as needed.    mirtazapine (REMERON) 15 MG tablet Take 1 tablet (15 mg total) by mouth at bedtime.   morphine (MS CONTIN) 30 MG 12 hr tablet Take 1 tablet (30 mg total) by mouth in the morning, at noon, and at bedtime.   nitroGLYCERIN (NITROSTAT) 0.4 MG SL tablet Place under the tongue.   ondansetron (ZOFRAN-ODT) 4 MG disintegrating tablet DISSOLVE 1 TO 2 TABLETS IN MOUTH EVERY 8 HOURS AS NEEDED FOR NAUSEA   pantoprazole (PROTONIX) 40 MG tablet Take 1 tablet (40 mg total) by mouth daily.   phenazopyridine (PYRIDIUM) 200 MG tablet Take 1 tablet (200 mg total) by mouth 3 (three) times daily as needed for pain.   Potassium Chloride ER 20 MEQ TBCR Take 1 tablet by mouth  2 (two) times daily with a meal.   pramipexole (MIRAPEX) 0.25 MG tablet Take 1 tablet (0.25 mg total) by mouth at bedtime.   pravastatin (PRAVACHOL) 40 MG tablet Take 1 tablet (40 mg total) by mouth daily.   sulfamethoxazole-trimethoprim (BACTRIM DS) 800-160 MG tablet Take 1 tablet by mouth 2 (two) times daily.   No facility-administered encounter medications on file as of 01/10/2021.    Current COPD regimen:  Albuterol 2 puffs as needed Trelegy 1 puff daily  (Patient reported using as needed) No flowsheet data found.  Any recent hospitalizations or ED visits since last visit with CPP? Yes  reports the following COPD symptoms, including Shortness of breath at rest and Symptoms worse at night   What recent interventions/DTPs have been made by any provider to improve breathing since last visit:   Have you had exacerbation/flare-up since last visit? No  What do you do when you are short of breath?  Rescue medication  Current tobacco use? Interested in cessation? Patient states she smokes a lot less.  Respiratory Devices/Equipment Do you have a nebulizer? Yes Do you use a Peak Flow Meter? No Do you use a maintenance inhaler? Yes How often do you forget to use your daily inhaler? As needed Do you use a rescue inhaler? No How often do you use your rescue inhaler?  prn Do you use a spacer with your inhaler? No  Adherence Review: Does the patient have >5 day gap between last estimated fill date for maintenance inhaler medications? CPP to review  Care Gaps: Last annual wellness visit? None Tdap overdue Hep C screening overdue A1C overdue last completed 06-27-2020  STAR: Pravastatin 40 mg- Last filled 11-19-2020 90 DS  NOTES: Patient states she would like to be put back on O2 at night because she has trouble breathing when she lays down. Sent message to Dr. Trevor Iha CMA Clinical Pharmacist Assistant [phone]

## 2021-01-13 DIAGNOSIS — R350 Frequency of micturition: Secondary | ICD-10-CM | POA: Diagnosis not present

## 2021-01-13 DIAGNOSIS — N390 Urinary tract infection, site not specified: Secondary | ICD-10-CM | POA: Diagnosis not present

## 2021-01-22 ENCOUNTER — Other Ambulatory Visit: Payer: Self-pay

## 2021-01-22 MED ORDER — POTASSIUM CHLORIDE ER 20 MEQ PO TBCR: 1.0000 | EXTENDED_RELEASE_TABLET | Freq: Two times a day (BID) | ORAL | 1 refills | Status: DC

## 2021-02-03 ENCOUNTER — Telehealth: Payer: Self-pay

## 2021-02-03 DIAGNOSIS — M25532 Pain in left wrist: Secondary | ICD-10-CM | POA: Diagnosis not present

## 2021-02-03 DIAGNOSIS — M25522 Pain in left elbow: Secondary | ICD-10-CM | POA: Diagnosis not present

## 2021-02-03 DIAGNOSIS — Z23 Encounter for immunization: Secondary | ICD-10-CM | POA: Diagnosis not present

## 2021-02-03 DIAGNOSIS — S61412A Laceration without foreign body of left hand, initial encounter: Secondary | ICD-10-CM | POA: Diagnosis not present

## 2021-02-03 DIAGNOSIS — W19XXXA Unspecified fall, initial encounter: Secondary | ICD-10-CM | POA: Diagnosis not present

## 2021-02-03 DIAGNOSIS — S6000XA Contusion of unspecified finger without damage to nail, initial encounter: Secondary | ICD-10-CM | POA: Diagnosis not present

## 2021-02-03 DIAGNOSIS — M25512 Pain in left shoulder: Secondary | ICD-10-CM | POA: Diagnosis not present

## 2021-02-03 NOTE — Telephone Encounter (Signed)
Donna Little called to report that she had a fall last pm injuring her left arm and shoulder.  Symptoms discussed with Dr. Tobie Poet.  No appointments are available today in the office.  She was instructed to go to the Urgent Care for evaluation and treatment.  Patient verbalized understanding and agreed to go.

## 2021-02-05 ENCOUNTER — Other Ambulatory Visit: Payer: Self-pay

## 2021-02-05 MED ORDER — DICYCLOMINE HCL 10 MG PO CAPS: ORAL_CAPSULE | ORAL | 1 refills | Status: DC

## 2021-02-05 MED ORDER — MORPHINE SULFATE ER 30 MG PO TBCR: 30.0000 mg | EXTENDED_RELEASE_TABLET | Freq: Three times a day (TID) | ORAL | 0 refills | Status: DC

## 2021-02-05 NOTE — Telephone Encounter (Signed)
Patient called requesting the following refills sent into [facility] in [place]:  Dicyclomine 10 mg (#360 sent 07/04/20) Morphine 30 mg (#90 sent 12/20/20)  Patient has chronic fasting visit scheduled 03/05/21.

## 2021-02-07 ENCOUNTER — Other Ambulatory Visit: Payer: Self-pay | Admitting: Family Medicine

## 2021-02-07 DIAGNOSIS — N390 Urinary tract infection, site not specified: Secondary | ICD-10-CM | POA: Diagnosis not present

## 2021-02-13 DIAGNOSIS — N390 Urinary tract infection, site not specified: Secondary | ICD-10-CM | POA: Diagnosis not present

## 2021-02-19 ENCOUNTER — Other Ambulatory Visit: Payer: Self-pay | Admitting: Family Medicine

## 2021-02-25 ENCOUNTER — Telehealth: Payer: Self-pay | Admitting: Family Medicine

## 2021-02-25 ENCOUNTER — Other Ambulatory Visit: Payer: Self-pay | Admitting: Family Medicine

## 2021-02-25 MED ORDER — DICYCLOMINE HCL 20 MG PO TABS: 20.0000 mg | ORAL_TABLET | Freq: Three times a day (TID) | ORAL | 2 refills | Status: DC

## 2021-02-25 NOTE — Telephone Encounter (Signed)
Pt called. Pharmacy does not have dicyclomine 10 mg, would like to change to 20 mg  qac and qhs

## 2021-03-01 NOTE — Telephone Encounter (Signed)
Done

## 2021-03-05 ENCOUNTER — Ambulatory Visit (INDEPENDENT_AMBULATORY_CARE_PROVIDER_SITE_OTHER): Payer: Medicare Other | Admitting: Family Medicine

## 2021-03-05 ENCOUNTER — Other Ambulatory Visit: Payer: Self-pay

## 2021-03-05 ENCOUNTER — Encounter: Payer: Self-pay | Admitting: Family Medicine

## 2021-03-05 VITALS — BP 128/60 | HR 68 | Temp 97.3°F | Resp 18 | Wt 119.0 lb

## 2021-03-05 DIAGNOSIS — E782 Mixed hyperlipidemia: Secondary | ICD-10-CM

## 2021-03-05 DIAGNOSIS — Z720 Tobacco use: Secondary | ICD-10-CM

## 2021-03-05 DIAGNOSIS — L650 Telogen effluvium: Secondary | ICD-10-CM

## 2021-03-05 DIAGNOSIS — F330 Major depressive disorder, recurrent, mild: Secondary | ICD-10-CM

## 2021-03-05 DIAGNOSIS — M810 Age-related osteoporosis without current pathological fracture: Secondary | ICD-10-CM

## 2021-03-05 DIAGNOSIS — F1120 Opioid dependence, uncomplicated: Secondary | ICD-10-CM

## 2021-03-05 DIAGNOSIS — E1169 Type 2 diabetes mellitus with other specified complication: Secondary | ICD-10-CM

## 2021-03-05 DIAGNOSIS — M546 Pain in thoracic spine: Secondary | ICD-10-CM

## 2021-03-05 DIAGNOSIS — J449 Chronic obstructive pulmonary disease, unspecified: Secondary | ICD-10-CM

## 2021-03-05 DIAGNOSIS — I110 Hypertensive heart disease with heart failure: Secondary | ICD-10-CM

## 2021-03-05 DIAGNOSIS — I679 Cerebrovascular disease, unspecified: Secondary | ICD-10-CM

## 2021-03-05 DIAGNOSIS — I700 Atherosclerosis of aorta: Secondary | ICD-10-CM

## 2021-03-05 DIAGNOSIS — G8929 Other chronic pain: Secondary | ICD-10-CM

## 2021-03-05 DIAGNOSIS — K219 Gastro-esophageal reflux disease without esophagitis: Secondary | ICD-10-CM

## 2021-03-05 MED ORDER — CITALOPRAM HYDROBROMIDE 20 MG PO TABS: 20.0000 mg | ORAL_TABLET | Freq: Every day | ORAL | 3 refills | Status: DC

## 2021-03-05 MED ORDER — NALOXONE HCL 4 MG/0.1ML NA LIQD: NASAL | 1 refills | Status: DC

## 2021-03-05 NOTE — Progress Notes (Signed)
Subjective:  Patient ID: Donna Little, female    DOB: Oct 21, 1945  Age: 75 y.o. MRN: [id number]  Chief Complaint  Patient presents with   Hypertension   COPD   Hyperlipidemia    HPI Patient is a 75 year old white female with past medical history significant for hypertension, hyperlipidemia, carotid stenosis, chronic pain syndrome, back pain, type 2 diabetes, COPD, history of lacunar strokes with no significant sequelae.  Hypertension heart disease with heart failure: Currently on Lasix 40 mg twice daily and hydralazine 25 mg as needed if blood pressure is greater than 180.  Patient sees Dr. Atilano Median.  Patient has a history of strokes.  She has no sequela.  Patient has carotid stenosis and aortic atherosclerosis, which is being treated with Plavix and pravastatin. COPD: Currently on Trelegy 1 inhalation daily and albuterol inhaler 2 puffs 4 times a day as needed.  Chronic pain syndrome: Patient has chronic pain related to her entire back.  The majority of it is in mid back where she had surgery and has significant issues with hardware protruding into the skin and she is causing significant pain.  Patient also has osteoporosis and has had compression fractures.  She is currently on gabapentin 800 mg 3 times daily and MS Contin 30 mg 3 times daily.  Patient's pain varies from a 6 out of 10 to a 9 out of 10.  She seen multiple neurosurgeons and is not a candidate for surgery.  Patient does not have naltrexone at home.  Depression: Fairly well controlled with Celexa 20 mg daily.  Restless leg syndrome: Currently on pramipexole 0.25 mg nightly.  Also has mirtazapine 15 mg once at bedtime for sleep.  GERD: Currently on Protonix 40 mg once daily.  Osteoporosis: Currently on Prolia every 6 months.  Type 2 diabetes: Most recent A1c was 5.5.  Patient is currently not on any medication for diabetes.   Tobacco disorder: Patient smokes a third of a pack a day.  She has been a smoker for 50 years.   Previously she smoked from 1 to 2 packs a day  Current Outpatient Medications on File Prior to Visit  Medication Sig Dispense Refill   acetaminophen (TYLENOL) 325 MG tablet Take by mouth.     albuterol (VENTOLIN HFA) 108 (90 Base) MCG/ACT inhaler Inhale 2 puffs into the lungs every 6 (six) hours as needed.      clopidogrel (PLAVIX) 75 MG tablet Take 1 tablet (75 mg total) by mouth daily. 90 tablet 1   diclofenac Sodium (VOLTAREN) 1 % GEL Apply 2 g topically 4 (four) times daily as needed.      dicyclomine (BENTYL) 20 MG tablet Take 1 tablet (20 mg total) by mouth 4 (four) times daily -  before meals and at bedtime. 120 tablet 2   Fluticasone-Umeclidin-Vilant (TRELEGY ELLIPTA) 100-62.5-25 MCG/INH AEPB INHALE 1 PUFF ONCE DAILY USE AT THE SAME TIME EACH DAY 1 each 1   furosemide (LASIX) 40 MG tablet Take 2 tablets by mouth twice daily 360 tablet 0   gabapentin (NEURONTIN) 800 MG tablet Take 1 tablet (800 mg total) by mouth 3 (three) times daily. 270 tablet 1   hydrALAZINE (APRESOLINE) 25 MG tablet Take 25 mg by mouth as needed (DAILY if blood pressure is 180 or greater). Take one daily if blood pressure is 180 or greater     lidocaine (LIDODERM) 5 % Place 3 patches onto the skin daily. Remove & Discard patch within 12 hours or as directed by MD  90 patch 3   meclizine (ANTIVERT) 25 MG tablet Take 25 mg by mouth 3 (three) times daily as needed.      mirtazapine (REMERON) 15 MG tablet Take 1 tablet (15 mg total) by mouth at bedtime. 30 tablet 3   morphine (MS CONTIN) 30 MG 12 hr tablet Take 1 tablet (30 mg total) by mouth in the morning, at noon, and at bedtime. 90 tablet 0   nitroGLYCERIN (NITROSTAT) 0.4 MG SL tablet Place under the tongue.     ondansetron (ZOFRAN-ODT) 4 MG disintegrating tablet DISSOLVE 1 TO 2 TABLETS IN MOUTH EVERY 8 HOURS AS NEEDED FOR NAUSEA 40 tablet 0   pantoprazole (PROTONIX) 40 MG tablet Take 1 tablet (40 mg total) by mouth daily. 90 tablet 1   phenazopyridine (PYRIDIUM) 200  MG tablet Take 1 tablet (200 mg total) by mouth 3 (three) times daily as needed for pain. 10 tablet 0   pramipexole (MIRAPEX) 0.25 MG tablet Take 1 tablet (0.25 mg total) by mouth at bedtime. 30 tablet 2   pravastatin (PRAVACHOL) 40 MG tablet Take 1 tablet (40 mg total) by mouth daily. 90 tablet 1   sulfamethoxazole-trimethoprim (BACTRIM DS) 800-160 MG tablet Take 1 tablet by mouth 2 (two) times daily. 14 tablet 0   No current facility-administered medications on file prior to visit.   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[place])    Pulmonary embolism (HCC)    Stroke ([place])    ministroke   Supraventricular tachycardia ([place])    Type 2 diabetes mellitus ([place])    Past Surgical History:  Procedure Laterality Date   ABDOMINAL HYSTERECTOMY     APPENDECTOMY     CATARACT EXTRACTION     CORONARY ANGIOPLASTY WITH STENT PLACEMENT     KYPHOPLASTY  05/2020   L4-L5    Family History  Problem Relation Age of Onset   Diabetes Other    Stroke Other    Heart disease Other    Social History   Socioeconomic History   Marital status: Single    Spouse name: Not on file   Number of children: 2   Years of education: Not on file   Highest education level: Not on file  Occupational History   Occupation: retired  Tobacco Use   Smoking status: Light Smoker    Packs/day: 0.33    Years: 50.00    Pack years: 16.50    Types: Cigarettes   Smokeless tobacco: Never   Tobacco comments:    smokes 1-2 cigarettes  per week.  Substance and Sexual Activity   Alcohol use: No    Alcohol/week: 0.0 standard drinks   Drug use: No   Sexual activity: Not on file  Other Topics Concern   Not on file  Social History Narrative   Not on file   Social Determinants of Health   Financial Resource Strain: Not on file  Food  Insecurity: Not on file  Transportation Needs: Not on file  Physical Activity: Not on file  Stress: Not on file  Social Connections: Not on file    Review of Systems  Constitutional:  Negative for chills, fatigue and fever.  HENT:  Negative for congestion, rhinorrhea and sore throat.   Eyes:  Positive for visual disturbance.  Respiratory:  Positive for cough and shortness of breath.   Cardiovascular:  Negative for chest pain.  Gastrointestinal:  Negative for abdominal pain, constipation, diarrhea, nausea and vomiting.  Genitourinary:  Positive for frequency. Negative for dysuria and urgency.  Musculoskeletal:  Positive for arthralgias, back pain and myalgias.  Neurological:  Negative for dizziness, weakness, light-headedness and headaches.  Psychiatric/Behavioral:  Negative for dysphoric mood. The patient is not nervous/anxious.     Objective:  BP 128/60   Pulse 68   Temp (!) 97.3 F (36.3 C)   Resp 18   Wt 119 lb (54 kg)   BMI 21.08 kg/m   BP/Weight 03/05/2021 11/27/2020 07/13/8117  Systolic BP 147 829 562  Diastolic BP 60 40 64  Wt. (Lbs) 119 121 118  BMI 21.08 21.43 20.9    Physical Exam Vitals reviewed.  Constitutional:      Appearance: Normal appearance. She is normal weight.  Neck:     Vascular: No carotid bruit.  Cardiovascular:     Rate and Rhythm: Normal rate and regular rhythm.     Heart sounds: Normal heart sounds.  Pulmonary:     Effort: Pulmonary effort is normal. No respiratory distress.     Breath sounds: Normal breath sounds.  Abdominal:     General: Abdomen is flat. Bowel sounds are normal.     Palpations: Abdomen is soft.     Tenderness: There  is no abdominal tenderness.  Musculoskeletal:        General: Tenderness (Tenderness over upper lumbar and lower thoracic spine.  Hardware can be felt below the skin.  Patient walks with a severe kyphosis due to back pain.) present.  Neurological:     Mental Status: She is alert and oriented to person, place, and time.  Psychiatric:        Mood and Affect: Mood normal.        Behavior: Behavior normal.    Diabetic Foot Exam - Simple   No data filed      Lab Results  Component Value Date   WBC 7.3 03/05/2021   HGB 14.0 03/05/2021   HCT 40.8 03/05/2021   PLT 200 03/05/2021   GLUCOSE 88 03/05/2021   CHOL 145 11/19/2020   TRIG 139 11/19/2020   HDL 38 (L) 11/19/2020   LDLCALC 82 11/19/2020   ALT 10 03/05/2021   AST 20 03/05/2021   NA 141 03/05/2021   K 5.3 (H) 03/05/2021   CL 100 03/05/2021   CREATININE 0.98 03/05/2021   BUN 17 03/05/2021   CO2 27 03/05/2021   TSH 0.675 03/05/2021   HGBA1C 5.5 06/27/2020   MICROALBUR 30 01/20/2020      Assessment & Plan:   Problem List Items Addressed This Visit       Cardiovascular and Mediastinum   Hypertensive heart disease with heart failure ([place])    Well controlled.  No changes to medicines.  Continue to work on eating a healthy diet. Labs drawn today.        Aortic atherosclerosis (HCC)    Continue pravastatin and Plavix. Pt also has carotid stenosis which is being monitored by Dr. Atilano Median.  Intracranial vascular disease    Continue plavix and pravastatin.  Pt has had several small strokes.         Respiratory   COPD mixed type (HCC)    Stable.   Continue Trelegy and albuterol. Recommend quit smoking.        Digestive   GERD (gastroesophageal reflux disease)    Continue protonix.         Endocrine   Combined hyperlipidemia associated with type 2 diabetes mellitus ([place])    Well controlled with diet. Check a1c today.          Musculoskeletal and Integument   Telogen effluvium - Primary     Check labs.       Relevant Orders   CBC with Differential/Platelet (Completed)   Comprehensive metabolic panel (Completed)   T4, free (Completed)   TSH (Completed)   Ferritin (Completed)   Osteoporosis, post-menopausal    Recommend calcium with D.  Should be getting prolia every 6 months.  I will review chart to be sure she is getting this.         Other   Hyperlipidemia    The current medical regimen is effective;  continue present plan and medications.       Tobacco abuse    No desire to quit. Has cut down.       Mild recurrent major depression (HCC)    The current medical regimen is effective;  continue present plan and medications.       Relevant Medications   citalopram (CELEXA) 20 MG tablet   Uncomplicated opioid dependence ([place])    Last UDS 08/2020 c/w medications.  rx for narcan. Explained to patient and her son how and when to use.       Chronic midline thoracic back pain    Not at goal.  Pt has severe chronic back pain.  Using lidoderm patches, mscontin, gabapentin.      Relevant Medications   citalopram (CELEXA) 20 MG tablet  .  Meds ordered this encounter  Medications   citalopram (CELEXA) 20 MG tablet    Sig: Take 1 tablet (20 mg total) by mouth daily.    Dispense:  90 tablet    Refill:  3   naloxone (NARCAN) nasal spray 4 mg/0.1 mL    Sig: 1 spray (4 mg) intranasally into 1 nostril. Use a new Narcan(R) nasal spray for subsequent doses and administer into alternating nostrils. May repeat every 2 to 3 minutes    Dispense:  2 each    Refill:  1    Orders Placed This Encounter  Procedures   CBC with Differential/Platelet   Comprehensive metabolic panel   T4, free   TSH   Ferritin     Follow-up: Return in about 3 months (around 06/03/2021) for chronic fasting, AWV end of December. .  An After Visit Summary was printed and given to the patient.  [name], MD [facility] [phone]

## 2021-03-06 LAB — COMPREHENSIVE METABOLIC PANEL
ALT: 10 IU/L (ref 0–32)
AST: 20 IU/L (ref 0–40)
Albumin/Globulin Ratio: 1.7 (ref 1.2–2.2)
Albumin: 4.1 g/dL (ref 3.7–4.7)
Alkaline Phosphatase: 85 IU/L (ref 44–121)
BUN/Creatinine Ratio: 17 (ref 12–28)
BUN: 17 mg/dL (ref 8–27)
Bilirubin Total: 0.2 mg/dL (ref 0.0–1.2)
CO2: 27 mmol/L (ref 20–29)
Calcium: 9.7 mg/dL (ref 8.7–10.3)
Chloride: 100 mmol/L (ref 96–106)
Creatinine, Ser: 0.98 mg/dL (ref 0.57–1.00)
Globulin, Total: 2.4 g/dL (ref 1.5–4.5)
Glucose: 88 mg/dL (ref 70–99)
Potassium: 5.3 mmol/L — ABNORMAL HIGH (ref 3.5–5.2)
Sodium: 141 mmol/L (ref 134–144)
Total Protein: 6.5 g/dL (ref 6.0–8.5)
eGFR: 59 mL/min/{1.73_m2} — ABNORMAL LOW (ref >59)

## 2021-03-06 LAB — CBC WITH DIFFERENTIAL/PLATELET
Basophils Absolute: 0.1 10*3/uL (ref 0.0–0.2)
Basos: 1 %
EOS (ABSOLUTE): 0.4 10*3/uL (ref 0.0–0.4)
Eos: 6 %
Hematocrit: 40.8 % (ref 34.0–46.6)
Hemoglobin: 14 g/dL (ref 11.1–15.9)
Immature Grans (Abs): 0 10*3/uL (ref 0.0–0.1)
Immature Granulocytes: 0 %
Lymphocytes Absolute: 1.6 10*3/uL (ref 0.7–3.1)
Lymphs: 22 %
MCH: 32 pg (ref 26.6–33.0)
MCHC: 34.3 g/dL (ref 31.5–35.7)
MCV: 93 fL (ref 79–97)
Monocytes Absolute: 0.6 10*3/uL (ref 0.1–0.9)
Monocytes: 8 %
Neutrophils Absolute: 4.6 10*3/uL (ref 1.4–7.0)
Neutrophils: 63 %
Platelets: 200 10*3/uL (ref 150–450)
RBC: 4.38 x10E6/uL (ref 3.77–5.28)
RDW: 13.3 % (ref 11.7–15.4)
WBC: 7.3 10*3/uL (ref 3.4–10.8)

## 2021-03-06 LAB — T4, FREE: Free T4: 1.26 ng/dL (ref 0.82–1.77)

## 2021-03-06 LAB — TSH: TSH: 0.675 u[IU]/mL (ref 0.450–4.500)

## 2021-03-06 LAB — FERRITIN: Ferritin: 92 ng/mL (ref 15–150)

## 2021-03-06 NOTE — Progress Notes (Signed)
Blood count normal.  Liver function normal.  Kidney function normal.  Potassium a little high. Recommend decrease potassium chloride to 20 meq one daily. If only taking one, pt should stop potassium chloride.  Thyroid function normal.  Ferritin normal.  Recommend use topical rogaine otc.

## 2021-03-07 ENCOUNTER — Other Ambulatory Visit: Payer: Self-pay

## 2021-03-07 MED ORDER — POTASSIUM CHLORIDE ER 20 MEQ PO TBCR: 1.0000 | EXTENDED_RELEASE_TABLET | Freq: Every day | ORAL | 1 refills | Status: DC

## 2021-03-15 ENCOUNTER — Encounter: Payer: Self-pay | Admitting: Family Medicine

## 2021-03-15 DIAGNOSIS — K219 Gastro-esophageal reflux disease without esophagitis: Secondary | ICD-10-CM | POA: Insufficient documentation

## 2021-03-15 DIAGNOSIS — I700 Atherosclerosis of aorta: Secondary | ICD-10-CM | POA: Insufficient documentation

## 2021-03-15 DIAGNOSIS — L650 Telogen effluvium: Secondary | ICD-10-CM | POA: Insufficient documentation

## 2021-03-15 DIAGNOSIS — F1120 Opioid dependence, uncomplicated: Secondary | ICD-10-CM | POA: Insufficient documentation

## 2021-03-15 DIAGNOSIS — G8929 Other chronic pain: Secondary | ICD-10-CM | POA: Insufficient documentation

## 2021-03-15 DIAGNOSIS — I679 Cerebrovascular disease, unspecified: Secondary | ICD-10-CM | POA: Insufficient documentation

## 2021-03-15 DIAGNOSIS — M810 Age-related osteoporosis without current pathological fracture: Secondary | ICD-10-CM | POA: Insufficient documentation

## 2021-03-15 DIAGNOSIS — J449 Chronic obstructive pulmonary disease, unspecified: Secondary | ICD-10-CM | POA: Insufficient documentation

## 2021-03-15 NOTE — Assessment & Plan Note (Signed)
Continue plavix and pravastatin.  Pt has had several small strokes.

## 2021-03-15 NOTE — Assessment & Plan Note (Signed)
The current medical regimen is effective;  continue present plan and medications.  

## 2021-03-15 NOTE — Assessment & Plan Note (Signed)
Not at goal.  Pt has severe chronic back pain.  Using lidoderm patches, mscontin, gabapentin.

## 2021-03-15 NOTE — Assessment & Plan Note (Signed)
>>  ASSESSMENT AND PLAN FOR TOBACCO ABUSE WRITTEN ON 03/15/2021  9:14 PM BY COX, KIRSTEN, MD  No desire to quit. Has cut down.

## 2021-03-15 NOTE — Assessment & Plan Note (Signed)
Continue protonix  

## 2021-03-15 NOTE — Assessment & Plan Note (Signed)
Check labs 

## 2021-03-15 NOTE — Assessment & Plan Note (Addendum)
Continue pravastatin and Plavix. Pt also has carotid stenosis which is being monitored by Dr. Atilano Median.

## 2021-03-15 NOTE — Assessment & Plan Note (Signed)
Recommend calcium with D.  Should be getting prolia every 6 months.  I will review chart to be sure she is getting this.

## 2021-03-15 NOTE — Assessment & Plan Note (Signed)
No desire to quit. Has cut down.

## 2021-03-15 NOTE — Assessment & Plan Note (Signed)
Well controlled.  No changes to medicines.  Continue to work on eating a healthy diet. Labs drawn today.

## 2021-03-15 NOTE — Assessment & Plan Note (Signed)
Last UDS 08/2020 c/w medications.  rx for narcan. Explained to patient and her son how and when to use.

## 2021-03-15 NOTE — Assessment & Plan Note (Signed)
Stable.   Continue Trelegy and albuterol. Recommend quit smoking.

## 2021-03-15 NOTE — Assessment & Plan Note (Signed)
Well controlled with diet. Check a1c today.

## 2021-03-19 ENCOUNTER — Ambulatory Visit (INDEPENDENT_AMBULATORY_CARE_PROVIDER_SITE_OTHER): Payer: Medicare Other

## 2021-03-19 DIAGNOSIS — M810 Age-related osteoporosis without current pathological fracture: Secondary | ICD-10-CM | POA: Diagnosis not present

## 2021-03-19 MED ORDER — DENOSUMAB 60 MG/ML ~~LOC~~ SOSY
60.0000 mg | PREFILLED_SYRINGE | Freq: Once | SUBCUTANEOUS | Status: AC
Start: 2021-03-19 — End: 2021-03-19
  Administered 2021-03-19: 15:00:00 60 mg via SUBCUTANEOUS

## 2021-03-21 ENCOUNTER — Other Ambulatory Visit: Payer: Self-pay

## 2021-03-21 MED ORDER — MORPHINE SULFATE ER 30 MG PO TBCR: 30.0000 mg | EXTENDED_RELEASE_TABLET | Freq: Three times a day (TID) | ORAL | 0 refills | Status: DC

## 2021-04-09 ENCOUNTER — Ambulatory Visit (INDEPENDENT_AMBULATORY_CARE_PROVIDER_SITE_OTHER): Payer: Medicare Other

## 2021-04-09 ENCOUNTER — Other Ambulatory Visit: Payer: Self-pay

## 2021-04-09 VITALS — BP 120/54 | HR 89 | Resp 18 | Ht 63.0 in | Wt 112.0 lb

## 2021-04-09 DIAGNOSIS — Z681 Body mass index (BMI) 19 or less, adult: Secondary | ICD-10-CM | POA: Diagnosis not present

## 2021-04-09 DIAGNOSIS — Z0000 Encounter for general adult medical examination without abnormal findings: Secondary | ICD-10-CM

## 2021-04-09 NOTE — Progress Notes (Signed)
Subjective:   Donna Little is a 76 y.o. female who presents for Medicare Annual (Subsequent) preventive examination.  This wellness visit is conducted by a nurse.  The patient's medications were reviewed and reconciled since the patient's last visit.  History details were provided by the patient.  The history appears to be reliable.    Patient's last AWV was one year ago.   Medical History: Patient history and Family history was reviewed  Medications, Allergies, and preventative health maintenance was reviewed and updated.   Review of Systems    Review of Systems  Constitutional:  Positive for unexpected weight change.       7 lb decrease since November  HENT: Negative.    Eyes: Negative.   Respiratory:  Negative for cough and chest tightness.   Cardiovascular:  Negative for chest pain and palpitations.  Gastrointestinal: Negative.   Genitourinary: Negative.   Musculoskeletal:  Positive for arthralgias, back pain and gait problem.   Cardiac Risk Factors include: advanced age (>75men, >85 women);diabetes mellitus;dyslipidemia;smoking/ tobacco exposure     Objective:    Today's Vitals   04/09/21 1058  BP: (!) 120/54  Pulse: 89  Resp: 18  SpO2: 95%  Weight: 112 lb (50.8 kg)  Height: 5\' 3"  (1.6 m)  PainSc: 5   PainLoc: Back   Body mass index is 19.84 kg/m.  Advanced Directives 04/09/2021 03/18/2020 06/20/2019  Does Patient Have a Medical Advance Directive? Yes Yes Yes  Type of Paramedic of [place];Living will [place];Living will [place];Living will  Does patient want to make changes to medical advance directive? Yes (MAU/Ambulatory/Procedural Areas - Information given) - -  Copy of [place] in Chart? No - copy requested No - copy requested -    Current Medications (verified) Outpatient Encounter Medications as of 04/09/2021  Medication Sig   acetaminophen (TYLENOL) 325 MG tablet Take by  mouth.   albuterol (VENTOLIN HFA) 108 (90 Base) MCG/ACT inhaler Inhale 2 puffs into the lungs every 6 (six) hours as needed.    citalopram (CELEXA) 20 MG tablet Take 1 tablet (20 mg total) by mouth daily.   clopidogrel (PLAVIX) 75 MG tablet Take 1 tablet (75 mg total) by mouth daily.   diclofenac Sodium (VOLTAREN) 1 % GEL Apply 2 g topically 4 (four) times daily as needed.    dicyclomine (BENTYL) 20 MG tablet Take 1 tablet (20 mg total) by mouth 4 (four) times daily -  before meals and at bedtime.   Fluticasone-Umeclidin-Vilant (TRELEGY ELLIPTA) 100-62.5-25 MCG/INH AEPB INHALE 1 PUFF ONCE DAILY USE AT THE SAME TIME EACH DAY   furosemide (LASIX) 40 MG tablet Take 2 tablets by mouth twice daily   gabapentin (NEURONTIN) 800 MG tablet Take 1 tablet (800 mg total) by mouth 3 (three) times daily.   hydrALAZINE (APRESOLINE) 25 MG tablet Take 25 mg by mouth as needed (DAILY if blood pressure is 180 or greater). Take one daily if blood pressure is 180 or greater   lidocaine (LIDODERM) 5 % Place 3 patches onto the skin daily. Remove & Discard patch within 12 hours or as directed by MD   meclizine (ANTIVERT) 25 MG tablet Take 25 mg by mouth 3 (three) times daily as needed.    mirtazapine (REMERON) 15 MG tablet Take 1 tablet (15 mg total) by mouth at bedtime.   morphine (MS CONTIN) 30 MG 12 hr tablet Take 1 tablet (30 mg total) by mouth in the  morning, at noon, and at bedtime.   naloxone (NARCAN) nasal spray 4 mg/0.1 mL 1 spray (4 mg) intranasally into 1 nostril. Use a new Narcan(R) nasal spray for subsequent doses and administer into alternating nostrils. May repeat every 2 to 3 minutes   nitroGLYCERIN (NITROSTAT) 0.4 MG SL tablet Place under the tongue.   ondansetron (ZOFRAN-ODT) 4 MG disintegrating tablet DISSOLVE 1 TO 2 TABLETS IN MOUTH EVERY 8 HOURS AS NEEDED FOR NAUSEA   pantoprazole (PROTONIX) 40 MG tablet Take 1 tablet (40 mg total) by mouth daily.   phenazopyridine (PYRIDIUM) 200 MG tablet Take 1  tablet (200 mg total) by mouth 3 (three) times daily as needed for pain.   Potassium Chloride ER 20 MEQ TBCR Take 1 tablet by mouth daily.   pramipexole (MIRAPEX) 0.25 MG tablet Take 1 tablet (0.25 mg total) by mouth at bedtime.   pravastatin (PRAVACHOL) 40 MG tablet Take 1 tablet (40 mg total) by mouth daily.   [DISCONTINUED] sulfamethoxazole-trimethoprim (BACTRIM DS) 800-160 MG tablet Take 1 tablet by mouth 2 (two) times daily.   No facility-administered encounter medications on file as of 04/09/2021.    Allergies (verified) Codeine, Macrobid [nitrofurantoin], Methocarbamol, Sertraline, and Tizanidine   History: Past Medical History:  Diagnosis Date   A-fib ([place])    Atherosclerosis of both carotid arteries 2022   noted per CT on 05/14/20   Chronic pain syndrome    Chronic systolic heart failure ([place])    Depression    Emphysema of lung ([place]) 2022   noted per CT scan on 05/14/20   Generalized atherosclerosis    GERD (gastroesophageal reflux disease)    Hypertension    IBS (irritable bowel syndrome)    Melanoma ([place])    Other specified nontoxic goiter    Palpitations    Polymyalgia rheumatica ([facility])    Primary pulmonary hypertension ([place])    Pulmonary embolism ([place])    Stroke ([place])    ministroke   Supraventricular tachycardia ([place])    Type 2 diabetes mellitus ([place])    Past Surgical History:  Procedure Laterality Date   ABDOMINAL HYSTERECTOMY     APPENDECTOMY     CATARACT EXTRACTION     CORONARY ANGIOPLASTY WITH STENT PLACEMENT     KYPHOPLASTY  05/2020   L4-L5   Family History  Problem Relation Age of Onset   Heart disease Mother    Diabetes Mother    Hyperlipidemia Mother    Hypertension Mother    Stroke Mother    Heart attack Mother    Heart disease Brother    Heart attack Brother    Stroke Brother    Hypertension Brother    Hyperlipidemia Brother    Social History   Socioeconomic History   Marital status: Single   Number of children: 2  Occupational  History   Occupation: retired  Tobacco Use   Smoking status: Light Smoker    Packs/day: 0.33    Years: 50.00    Pack years: 16.50    Types: Cigarettes   Smokeless tobacco: Never   Tobacco comments:    smokes 1-2 cigarettes per week.  Vaping Use   Vaping Use: Never used  Substance and Sexual Activity   Alcohol use: No    Alcohol/week: 0.0 standard drinks   Drug use: No   Social Determinants of Health   Financial Resource Strain: Not on file  Food Insecurity: Not on file  Transportation Needs: Not on file  Physical Activity: Not on file  Stress: Not  on file  Social Connections: Not on file    Tobacco Counseling Ready to quit: Not Answered Counseling given: Not Answered Tobacco comments: smokes 1-2 cigarettes per week.   Clinical Intake:  Pre-visit preparation completed: Yes  Pain : 0-10 Pain Score: 5  Pain Type: Chronic pain Pain Location: Back Pain Frequency: Constant Pain Relieving Factors: Morphine, Tylenol Effect of Pain on Daily Activities: moderate BMI - recorded: 19.84 Nutritional Status: BMI of 19-24  Normal Nutritional Risks: Unintentional weight loss Diabetes: Yes (last A1C 06/2020 5.5) CBG done?: No Did pt. bring in CBG monitor from home?: No How often do you need to have someone help you when you read instructions, pamphlets, or other written materials from your doctor or pharmacy?: 3 - Sometimes Interpreter Needed?: No    Activities of Daily Living In your present state of health, do you have any difficulty performing the following activities: 04/09/2021 11/19/2020  Hearing? N N  Vision? N -  Difficulty concentrating or making decisions? N N  Walking or climbing stairs? Y N  Dressing or bathing? N N  Doing errands, shopping? Y N  Preparing Food and eating ? N -  Using the Toilet? N -  In the past six months, have you accidently leaked urine? Y -  Do you have problems with loss of bowel control? N -  Managing your Medications? N -  Managing  your Finances? N -  Housekeeping or managing your Housekeeping? Y -  Some recent data might be hidden    Patient Care Team: [name], MD as PCP - General (Family Medicine) Atilano Median, Gayleen Orem, MD as Referring Physician (Cardiology) Nehemiah Settle, MD (Gastroenterology) Pieter Partridge, DO as Consulting Physician (Neurology) Gavin Pound, MD as Consulting Physician (Rheumatology) Debby Bud., MD (Surgery) Burnice Logan, [facility] (Inactive) as Pharmacist (Pharmacist) [name], MD as Referring Physician (Neurosurgery) Sherlyn Lees, FNP as Nurse Practitioner (Urology)     Assessment:   This is a routine wellness examination for Grethel.  Hearing/Vision screen No results found.  Dietary issues and exercise activities discussed: Current Exercise Habits: The patient does not participate in regular exercise at present, Exercise limited by: orthopedic condition(s)   Goals Addressed             This Visit's Progress    Prevent falls       Use cane when ambulating.  Keep floor clear of throw rugs and items that may cause you to trip.       Depression Screen PHQ 2/9 Scores 04/09/2021 03/05/2021 11/19/2020 10/23/2020 05/30/2020 03/18/2020 01/16/2020  PHQ - 2 Score 0 0 3 1 6 6 2   PHQ- 9 Score - - 8 - 18 15 7     Fall Risk Fall Risk  04/09/2021 03/05/2021 11/19/2020 10/23/2020 05/30/2020  Falls in the past year? 1 1 1 1 1   Comment - - - - -  Number falls in past yr: 1 1 1 1 1   Injury with Fall? 1 1 1 1 1   Risk for fall due to : History of fall(s);Impaired balance/gait - Impaired balance/gait;History of fall(s) History of fall(s);Impaired balance/gait;Impaired mobility -  Follow up Falls evaluation completed;Falls prevention discussed - Falls prevention discussed;Falls evaluation completed - -    FALL RISK PREVENTION PERTAINING TO THE HOME:  Any stairs in or around the home? No  If so, are there any without handrails? No  Home free of loose throw rugs in walkways, pet  beds, electrical cords, etc? Yes  Adequate lighting in  your home to reduce risk of falls? Yes   ASSISTIVE DEVICES UTILIZED TO PREVENT FALLS:  Use of a cane, walker or w/c? Yes  Grab bars in the bathroom? No  Shower chair or bench in shower? Yes  Gait slow and steady with assistive device  Cognitive Function: MMSE - Mini Mental State Exam 09/21/2019  Orientation to time 5  Orientation to Place 5  Registration 3  Attention/ Calculation 5  Recall 3  Language- name 2 objects 2  Language- repeat 1  Language- follow 3 step command 3  Language- read & follow direction 1  Write a sentence 1  Copy design 1  Total score 30     6CIT Screen 04/09/2021 09/06/2020 05/16/2020  What Year? 0 points 0 points 0 points  What month? 0 points 0 points -  What time? 0 points 0 points -  Count back from 20 0 points 0 points -  Months in reverse 0 points 0 points -  Repeat phrase 2 points 2 points -  Total Score 2 2 -    Immunizations Immunization History  Administered Date(s) Administered   Fluad Quad(high Dose 65+) 12/20/2019, 01/02/2021   Influenza-Unspecified 12/19/2018   PFIZER(Purple Top)SARS-COV-2 Vaccination 05/13/2019, 06/04/2019, 12/28/2019   Pneumococcal Conjugate-13 03/21/2014   Pneumococcal Polysaccharide-23 05/27/2018   Td 02/03/2021   Td,absorbed, Preservative Free, Adult Use, Lf Unspecified 02/03/2021   Zoster Recombinat (Shingrix) 12/16/2017, 05/27/2018    TDAP status: Up to date  Flu Vaccine status: Up to date  Pneumococcal vaccine status: Up to date  Covid-19 vaccine status: Declined, Patient is undecided on the latest Booster  Qualifies for Shingles Vaccine? Yes   Zostavax completed No   Shingrix Completed?: Yes  Screening Tests Health Maintenance  Topic Date Due   OPHTHALMOLOGY EXAM  Never done   Hepatitis C Screening  Never done   HEMOGLOBIN A1C  12/28/2020   FOOT EXAM  01/15/2021   TETANUS/TDAP  02/04/2031   Pneumonia Vaccine 35+ Years old  Completed    INFLUENZA VACCINE  Completed   DEXA SCAN  Completed   Zoster Vaccines- Shingrix  Completed   HPV VACCINES  Aged Out   COLONOSCOPY (Pts 45-4yrs Insurance coverage will need to be confirmed)  Discontinued   COVID-19 Vaccine  Discontinued    Health Maintenance  Health Maintenance Due  Topic Date Due   OPHTHALMOLOGY EXAM  Never done   Hepatitis C Screening  Never done   HEMOGLOBIN A1C  12/28/2020   FOOT EXAM  01/15/2021    Colorectal cancer screening: No longer required.   Mammogram status: No longer required   Bone Density status: Completed 2021.  Lung Cancer Screening: (Low Dose CT Chest recommended if Age 10-80 years, 30 pack-year currently smoking OR have quit w/in 15years.) does not qualify.   Additional Screening:  Vision Screening: Recommended annual ophthalmology exams for early detection of glaucoma and other disorders of the eye. Is the patient up to date with their annual eye exam?  Yes  Who is the provider or what is the name of the office in which the patient attends annual eye exams? [facility], Dr Milton Ferguson  Dental Screening: Recommended annual dental exams for proper oral hygiene    Plan:    1- Continue daily Boost shake supplements as well as nutritional meals.  Monitor weight loss. 2- Diabetic Eye Exam records requested from [address] recommended, patient declined at this time.  She is undecided.  I have personally reviewed and  noted the following in the patients chart:   Medical and social history Use of alcohol, tobacco or illicit drugs  Current medications and supplements including opioid prescriptions.  Functional ability and status Nutritional status Physical activity Advanced directives List of other physicians Hospitalizations, surgeries, and ER visits in previous 12 months Vitals Screenings to include cognitive, depression, and falls Referrals and appointments  In addition, I have reviewed and discussed  with patient certain preventive protocols, quality metrics, and best practice recommendations. A written personalized care plan for preventive services as well as general preventive health recommendations were provided to patient.     Erie Noe, LPN   04/12/107

## 2021-04-09 NOTE — Patient Instructions (Signed)
Fall Prevention in the Home, Adult Falls can cause injuries and can happen to people of all ages. There are many things you can do to make your home safe and to help prevent falls. Ask for help when making these changes. What actions can I take to prevent falls? General Instructions Use good lighting in all rooms. Replace any light bulbs that burn out. Turn on the lights in dark areas. Use night-lights. Keep items that you use often in easy-to-reach places. Lower the shelves around your home if needed. Set up your furniture so you have a clear path. Avoid moving your furniture around. Do not have throw rugs or other things on the floor that can make you trip. Avoid walking on wet floors. If any of your floors are uneven, fix them. Add color or contrast paint or tape to clearly mark and help you see: Grab bars or handrails. First and last steps of staircases. Where the edge of each step is. If you use a stepladder: Make sure that it is fully opened. Do not climb a closed stepladder. Make sure the sides of the stepladder are locked in place. Ask someone to hold the stepladder while you use it. Know where your pets are when moving through your home. What can I do in the bathroom?   Keep the floor dry. Clean up any water on the floor right away. Remove soap buildup in the tub or shower. Use nonskid mats or decals on the floor of the tub or shower. Attach bath mats securely with double-sided, nonslip rug tape. If you need to sit down in the shower, use a plastic, nonslip stool. Install grab bars by the toilet and in the tub and shower. Do not use towel bars as grab bars. What can I do in the bedroom? Make sure that you have a light by your bed that is easy to reach. Do not use any sheets or blankets for your bed that hang to the floor. Have a firm chair with side arms that you can use for support when you get dressed. What can I do in the kitchen? Clean up any spills right away. If you  need to reach something above you, use a step stool with a grab bar. Keep electrical cords out of the way. Do not use floor polish or wax that makes floors slippery. What can I do with my stairs? Do not leave any items on the stairs. Make sure that you have a light switch at the top and the bottom of the stairs. Make sure that there are handrails on both sides of the stairs. Fix handrails that are broken or loose. Install nonslip stair treads on all your stairs. Avoid having throw rugs at the top or bottom of the stairs. Choose a carpet that does not hide the edge of the steps on the stairs. Check carpeting to make sure that it is firmly attached to the stairs. Fix carpet that is loose or worn. What can I do on the outside of my home? Use bright outdoor lighting. Fix the edges of walkways and driveways and fix any cracks. Remove anything that might make you trip as you walk through a door, such as a raised step or threshold. Trim any bushes or trees on paths to your home. Check to see if handrails are loose or broken and that both sides of all steps have handrails. Install guardrails along the edges of any raised decks and porches. Clear paths of anything that can  make you trip, such as tools or rocks. Have leaves, snow, or ice cleared regularly. Use sand or salt on paths during winter. Clean up any spills in your garage right away. This includes grease or oil spills. What other actions can I take? Wear shoes that: Have a low heel. Do not wear high heels. Have rubber bottoms. Feel good on your feet and fit well. Are closed at the toe. Do not wear open-toe sandals. Use tools that help you move around if needed. These include: Canes. Walkers. Scooters. Crutches. Review your medicines with your doctor. Some medicines can make you feel dizzy. This can increase your chance of falling. Ask your doctor what else you can do to help prevent falls. Where to find more information Centers for  Disease Control and Prevention, [employer]: http://www.wolf.info/ National Institute on Aging: http://kim-miller.com/ Contact a doctor if: You are afraid of falling at home. You feel weak, drowsy, or dizzy at home. You fall at home. Summary There are many simple things that you can do to make your home safe and to help prevent falls. Ways to make your home safe include removing things that can make you trip and installing grab bars in the bathroom. Ask for help when making these changes in your home. This information is not intended to replace advice given to you by your health care provider. Make sure you discuss any questions you have with your health care provider. Document Revised: 10/25/2019 Document Reviewed: 10/25/2019 Elsevier Patient Education  [place] Maintenance, Female Adopting a healthy lifestyle and getting preventive care are important in promoting health and wellness. Ask your health care provider about: The right schedule for you to have regular tests and exams. Things you can do on your own to prevent diseases and keep yourself healthy. What should I know about diet, weight, and exercise? Eat a healthy diet  Eat a diet that includes plenty of vegetables, fruits, low-fat dairy products, and lean protein. Do not eat a lot of foods that are high in solid fats, added sugars, or sodium. Maintain a healthy weight Body mass index (BMI) is used to identify weight problems. It estimates body fat based on height and weight. Your health care provider can help determine your BMI and help you achieve or maintain a healthy weight. Get regular exercise Get regular exercise. This is one of the most important things you can do for your health. Most adults should: Exercise for at least 150 minutes each week. The exercise should increase your heart rate and make you sweat (moderate-intensity exercise). Do strengthening exercises at least twice a week. This is in addition to the  moderate-intensity exercise. Spend less time sitting. Even light physical activity can be beneficial. Watch cholesterol and blood lipids Have your blood tested for lipids and cholesterol at 78 years of age, then have this test every 5 years. Have your cholesterol levels checked more often if: Your lipid or cholesterol levels are high. You are older than 78 years of age. You are at high risk for heart disease. What should I know about cancer screening? Depending on your health history and family history, you may need to have cancer screening at various ages. This may include screening for: Breast cancer. Cervical cancer. Colorectal cancer. Skin cancer. Lung cancer. What should I know about heart disease, diabetes, and high blood pressure? Blood pressure and heart disease High blood pressure causes heart disease and increases the risk of stroke. This is more likely to develop in  people who have high blood pressure readings or are overweight. Have your blood pressure checked: Every 3-5 years if you are 52-63 years of age. Every year if you are 12 years old or older. Diabetes Have regular diabetes screenings. This checks your fasting blood sugar level. Have the screening done: Once every three years after age 79 if you are at a normal weight and have a low risk for diabetes. More often and at a younger age if you are overweight or have a high risk for diabetes. What should I know about preventing infection? Hepatitis B If you have a higher risk for hepatitis B, you should be screened for this virus. Talk with your health care provider to find out if you are at risk for hepatitis B infection. Hepatitis C Testing is recommended for: Everyone born from [birth year] through 1967. Anyone with known risk factors for hepatitis C. Sexually transmitted infections (STIs) Get screened for STIs, including gonorrhea and chlamydia, if: You are sexually active and are younger than 78 years of age. You are  older than 78 years of age and your health care provider tells you that you are at risk for this type of infection. Your sexual activity has changed since you were last screened, and you are at increased risk for chlamydia or gonorrhea. Ask your health care provider if you are at risk. Ask your health care provider about whether you are at high risk for HIV. Your health care provider may recommend a prescription medicine to help prevent HIV infection. If you choose to take medicine to prevent HIV, you should first get tested for HIV. You should then be tested every 3 months for as long as you are taking the medicine. Pregnancy If you are about to stop having your period (premenopausal) and you may become pregnant, seek counseling before you get pregnant. Take 400 to 800 micrograms (mcg) of folic acid every day if you become pregnant. Ask for birth control (contraception) if you want to prevent pregnancy. Osteoporosis and menopause Osteoporosis is a disease in which the bones lose minerals and strength with aging. This can result in bone fractures. If you are 42 years old or older, or if you are at risk for osteoporosis and fractures, ask your health care provider if you should: Be screened for bone loss. Take a calcium or vitamin D supplement to lower your risk of fractures. Be given hormone replacement therapy (HRT) to treat symptoms of menopause. Follow these instructions at home: Alcohol use Do not drink alcohol if: Your health care provider tells you not to drink. You are pregnant, may be pregnant, or are planning to become pregnant. If you drink alcohol: Limit how much you have to: 0-1 drink a day. Know how much alcohol is in your drink. In the [place] one drink equals one 12 oz bottle of beer (355 mL), one 5 oz glass of wine (148 mL), or one 1 oz glass of hard liquor (44 mL). Lifestyle Do not use any products that contain nicotine or tobacco. These products include cigarettes, chewing  tobacco, and vaping devices, such as e-cigarettes. If you need help quitting, ask your health care provider. Do not use street drugs. Do not share needles. Ask your health care provider for help if you need support or information about quitting drugs. General instructions Schedule regular health, dental, and eye exams. Stay current with your vaccines. Tell your health care provider if: You often feel depressed. You have ever been abused or do not feel  safe at home. Summary Adopting a healthy lifestyle and getting preventive care are important in promoting health and wellness. Follow your health care provider's instructions about healthy diet, exercising, and getting tested or screened for diseases. Follow your health care provider's instructions on monitoring your cholesterol and blood pressure. This information is not intended to replace advice given to you by your health care provider. Make sure you discuss any questions you have with your health care provider. Document Revised: 08/12/2020 Document Reviewed: 08/12/2020 Elsevier Patient Education  [place].

## 2021-04-17 ENCOUNTER — Other Ambulatory Visit: Payer: Self-pay

## 2021-04-18 MED ORDER — MORPHINE SULFATE ER 30 MG PO TBCR: 30.0000 mg | EXTENDED_RELEASE_TABLET | Freq: Three times a day (TID) | ORAL | 0 refills | Status: DC

## 2021-05-05 ENCOUNTER — Ambulatory Visit (INDEPENDENT_AMBULATORY_CARE_PROVIDER_SITE_OTHER): Payer: Medicare Other | Admitting: Family Medicine

## 2021-05-05 ENCOUNTER — Encounter: Payer: Self-pay | Admitting: Family Medicine

## 2021-05-05 ENCOUNTER — Other Ambulatory Visit: Payer: Self-pay

## 2021-05-05 VITALS — BP 102/52 | HR 60 | Temp 97.0°F | Resp 16 | Ht 63.0 in | Wt 116.0 lb

## 2021-05-05 DIAGNOSIS — M65331 Trigger finger, right middle finger: Secondary | ICD-10-CM

## 2021-05-05 DIAGNOSIS — M65341 Trigger finger, right ring finger: Secondary | ICD-10-CM

## 2021-05-05 MED ORDER — POTASSIUM CHLORIDE ER 20 MEQ PO TBCR
1.0000 | EXTENDED_RELEASE_TABLET | Freq: Every day | ORAL | 1 refills | Status: DC
Start: 2021-05-05 — End: 2022-04-22

## 2021-05-05 MED ORDER — PRAMIPEXOLE DIHYDROCHLORIDE 0.25 MG PO TABS: 0.2500 mg | ORAL_TABLET | Freq: Every evening | ORAL | 0 refills | Status: DC | PRN

## 2021-05-05 MED ORDER — DICYCLOMINE HCL 20 MG PO TABS
20.0000 mg | ORAL_TABLET | Freq: Three times a day (TID) | ORAL | 1 refills | Status: DC
Start: 2021-05-05 — End: 2022-02-18

## 2021-05-05 MED ORDER — CLOPIDOGREL BISULFATE 75 MG PO TABS: 75.0000 mg | ORAL_TABLET | Freq: Every day | ORAL | 1 refills | Status: DC

## 2021-05-05 MED ORDER — GABAPENTIN 800 MG PO TABS: 800.0000 mg | ORAL_TABLET | Freq: Three times a day (TID) | ORAL | 1 refills | Status: DC

## 2021-05-05 MED ORDER — FUROSEMIDE 40 MG PO TABS: 80.0000 mg | ORAL_TABLET | Freq: Two times a day (BID) | ORAL | 1 refills | Status: DC

## 2021-05-05 MED ORDER — TRIAMCINOLONE ACETONIDE 40 MG/ML IJ SUSP
20.0000 mg | Freq: Once | INTRAMUSCULAR | Status: AC
  Administered 2021-05-05: 20 mg via INTRA_ARTICULAR

## 2021-05-05 MED ORDER — PANTOPRAZOLE SODIUM 40 MG PO TBEC
40.0000 mg | DELAYED_RELEASE_TABLET | Freq: Every day | ORAL | 1 refills | Status: DC
Start: 2021-05-05 — End: 2021-07-04

## 2021-05-05 MED ORDER — PRAVASTATIN SODIUM 40 MG PO TABS: 40.0000 mg | ORAL_TABLET | Freq: Every day | ORAL | 1 refills | Status: DC

## 2021-05-05 NOTE — Assessment & Plan Note (Signed)
Risks were discussed including bleeding, infection, increase in sugars if diabetic, atrophy at site of injection, and increased pain.  After consent was obtained, using sterile technique the right hand was prepped with alcohol. Finger was injected at base with 20 mg and 0.5 ml plain Lidocaine was then injected and the needle withdrawn.  The procedure was well tolerated.   The patient is asked to continue to rest the joint for a few more days before resuming regular activities.  It may be more painful for the first 1-2 days.  Watch for fever, or increased swelling or persistent pain in the joint. Call or return to clinic prn if such symptoms occur or there is failure to improve as anticipated.

## 2021-05-05 NOTE — Progress Notes (Signed)
Acute Office Visit  Subjective:    Patient ID: Donna Little, female    DOB: 1945/06/08, 76 y.o.   MRN: [id number]  Chief Complaint  Patient presents with   trigger finger    Right middle and right ring finger    HPI: Patient is in today for trigger fingers of right hand. 3rd and 4th digits are affected. Requesting injections.  Past Medical History:  Diagnosis Date   A-fib [facility])    Atherosclerosis of both carotid arteries 2022   noted per CT on 05/14/20   Chronic pain syndrome    Chronic systolic heart failure ([place])    Depression    Emphysema of lung ([place]) 2022   noted per CT scan on 05/14/20   Generalized atherosclerosis    GERD (gastroesophageal reflux disease)    Hypertension    IBS (irritable bowel syndrome)    Melanoma ([place])    Other specified nontoxic goiter    Palpitations    Polymyalgia rheumatica ([facility])    Primary pulmonary hypertension ([place])    Pulmonary embolism ([place])    Stroke ([place])    ministroke   Supraventricular tachycardia ([place])    Type 2 diabetes mellitus ([place])     Past Surgical History:  Procedure Laterality Date   ABDOMINAL HYSTERECTOMY     APPENDECTOMY     CATARACT EXTRACTION     CORONARY ANGIOPLASTY WITH STENT PLACEMENT     KYPHOPLASTY  05/2020   L4-L5    Family History  Problem Relation Age of Onset   Heart disease Mother    Diabetes Mother    Hyperlipidemia Mother    Hypertension Mother    Stroke Mother    Heart attack Mother    Heart disease Brother    Heart attack Brother    Stroke Brother    Hypertension Brother    Hyperlipidemia Brother     Social History   Socioeconomic History   Marital status: Single    Spouse name: Not on file   Number of children: 2   Years of education: Not on file   Highest education level: Not on file  Occupational History   Occupation: retired  Tobacco Use   Smoking status: Light Smoker    Packs/day: 0.33    Years: 50.00    Pack years: 16.50    Types: Cigarettes   Smokeless tobacco:  Never   Tobacco comments:    smokes 1-2 cigarettes per week.  Vaping Use   Vaping Use: Never used  Substance and Sexual Activity   Alcohol use: No    Alcohol/week: 0.0 standard drinks   Drug use: No   Sexual activity: Not on file  Other Topics Concern   Not on file  Social History Narrative   Not on file   Social Determinants of Health   Financial Resource Strain: Not on file  Food Insecurity: Not on file  Transportation Needs: Not on file  Physical Activity: Not on file  Stress: Not on file  Social Connections: Not on file  Intimate Partner Violence: Not on file    Outpatient Medications Prior to Visit  Medication Sig Dispense Refill   acetaminophen (TYLENOL) 325 MG tablet Take by mouth.     albuterol (VENTOLIN HFA) 108 (90 Base) MCG/ACT inhaler Inhale 2 puffs into the lungs every 6 (six) hours as needed.      citalopram (CELEXA) 20 MG tablet Take 1 tablet (20 mg total) by mouth daily. 90 tablet 3   diclofenac Sodium (  VOLTAREN) 1 % GEL Apply 2 g topically 4 (four) times daily as needed.      hydrALAZINE (APRESOLINE) 25 MG tablet Take 25 mg by mouth as needed (DAILY if blood pressure is 180 or greater). Take one daily if blood pressure is 180 or greater     lidocaine (LIDODERM) 5 % Place 3 patches onto the skin daily. Remove & Discard patch within 12 hours or as directed by MD 90 patch 3   meclizine (ANTIVERT) 25 MG tablet Take 25 mg by mouth 3 (three) times daily as needed.      morphine (MS CONTIN) 30 MG 12 hr tablet Take 1 tablet (30 mg total) by mouth in the morning, at noon, and at bedtime. 90 tablet 0   naloxone (NARCAN) nasal spray 4 mg/0.1 mL 1 spray (4 mg) intranasally into 1 nostril. Use a new Narcan(R) nasal spray for subsequent doses and administer into alternating nostrils. May repeat every 2 to 3 minutes 2 each 1   nitroGLYCERIN (NITROSTAT) 0.4 MG SL tablet Place under the tongue.     ondansetron (ZOFRAN-ODT) 4 MG disintegrating tablet DISSOLVE 1 TO 2 TABLETS IN  MOUTH EVERY 8 HOURS AS NEEDED FOR NAUSEA 40 tablet 0   pramipexole (MIRAPEX) 0.25 MG tablet Take 1 tablet (0.25 mg total) by mouth at bedtime. 30 tablet 2   clopidogrel (PLAVIX) 75 MG tablet Take 1 tablet (75 mg total) by mouth daily. 90 tablet 1   dicyclomine (BENTYL) 20 MG tablet Take 1 tablet (20 mg total) by mouth 4 (four) times daily -  before meals and at bedtime. 120 tablet 2   Fluticasone-Umeclidin-Vilant (TRELEGY ELLIPTA) 100-62.5-25 MCG/INH AEPB INHALE 1 PUFF ONCE DAILY USE AT THE SAME TIME EACH DAY 1 each 1   furosemide (LASIX) 40 MG tablet Take 2 tablets by mouth twice daily 360 tablet 0   gabapentin (NEURONTIN) 800 MG tablet Take 1 tablet (800 mg total) by mouth 3 (three) times daily. 270 tablet 1   mirtazapine (REMERON) 15 MG tablet Take 1 tablet (15 mg total) by mouth at bedtime. 30 tablet 3   pantoprazole (PROTONIX) 40 MG tablet Take 1 tablet (40 mg total) by mouth daily. 90 tablet 1   phenazopyridine (PYRIDIUM) 200 MG tablet Take 1 tablet (200 mg total) by mouth 3 (three) times daily as needed for pain. 10 tablet 0   Potassium Chloride ER 20 MEQ TBCR Take 1 tablet by mouth daily. 90 tablet 1   pravastatin (PRAVACHOL) 40 MG tablet Take 1 tablet (40 mg total) by mouth daily. 90 tablet 1   No facility-administered medications prior to visit.    Allergies  Allergen Reactions   Codeine    Macrobid [Nitrofurantoin] Nausea And Vomiting   Methocarbamol    Sertraline     Insomnia    Tizanidine     Review of Systems  Constitutional:  Negative for fever.  HENT:  Negative for congestion, ear pain and sore throat.   Respiratory:  Positive for cough and shortness of breath.   Musculoskeletal:  Positive for arthralgias (right middle and right ring finger locking).  Psychiatric/Behavioral:  Negative for dysphoric mood.       Objective:    Physical Exam Vitals reviewed.  Constitutional:      Appearance: Normal appearance.  Musculoskeletal:     Comments: Right hand  3rd and  4th digit triggering. Tender.   Neurological:     Mental Status: She is alert.    BP (!) 102/52  Pulse 60    Temp (!) 97 F (36.1 C)    Resp 16    Ht _0  (1.6 m)    Wt 116 lb (52.6 kg)    BMI 20.55 kg/m  Wt Readings from Last 3 Encounters:  05/05/21 116 lb (52.6 kg)  04/09/21 112 lb (50.8 kg)  03/05/21 119 lb (54 kg)    Health Maintenance Due  Topic Date Due   OPHTHALMOLOGY EXAM  Never done   Hepatitis C Screening  Never done   HEMOGLOBIN A1C  12/28/2020   FOOT EXAM  01/15/2021    There are no preventive care reminders to display for this patient.   Lab Results  Component Value Date   TSH 0.675 03/05/2021   Lab Results  Component Value Date   WBC 7.3 03/05/2021   HGB 14.0 03/05/2021   HCT 40.8 03/05/2021   MCV 93 03/05/2021   PLT 200 03/05/2021   Lab Results  Component Value Date   NA 141 03/05/2021   K 5.3 (H) 03/05/2021   CO2 27 03/05/2021   GLUCOSE 88 03/05/2021   BUN 17 03/05/2021   CREATININE 0.98 03/05/2021   BILITOT 0.2 03/05/2021   ALKPHOS 85 03/05/2021   AST 20 03/05/2021   ALT 10 03/05/2021   PROT 6.5 03/05/2021   ALBUMIN 4.1 03/05/2021   CALCIUM 9.7 03/05/2021   EGFR 59 (L) 03/05/2021   Lab Results  Component Value Date   CHOL 145 11/19/2020   Lab Results  Component Value Date   HDL 38 (L) 11/19/2020   Lab Results  Component Value Date   LDLCALC 82 11/19/2020   Lab Results  Component Value Date   TRIG 139 11/19/2020   Lab Results  Component Value Date   CHOLHDL 3.8 11/19/2020   Lab Results  Component Value Date   HGBA1C 5.5 06/27/2020       Assessment & Plan:   Problem List Items Addressed This Visit       Musculoskeletal and Integument   Trigger finger, right ring finger - Primary    Risks were discussed including bleeding, infection, increase in sugars if diabetic, atrophy at site of injection, and increased pain.  After consent was obtained, using sterile technique the right hand was prepped with alcohol. Finger  was injected at base with 20 mg and 0.5 ml plain Lidocaine was then injected and the needle withdrawn.  The procedure was well tolerated.   The patient is asked to continue to rest the joint for a few more days before resuming regular activities.  It may be more painful for the first 1-2 days.  Watch for fever, or increased swelling or persistent pain in the joint. Call or return to clinic prn if such symptoms occur or there is failure to improve as anticipated.       Trigger finger, right middle finger    Risks were discussed including bleeding, infection, increase in sugars if diabetic, atrophy at site of injection, and increased pain.  After consent was obtained, using sterile technique the handwas prepped with alcohol. Finger was injected at base with 20 mg and 0.5 ml plain Lidocaine was then injected and the needle withdrawn.  The procedure was well tolerated.   The patient is asked to continue to rest the joint for a few more days before resuming regular activities.  It may be more painful for the first 1-2 days.  Watch for fever, or increased swelling or persistent pain in the joint. Call or  return to clinic prn if such symptoms occur or there is failure to improve as anticipated.      Meds ordered this encounter  Medications   furosemide (LASIX) 40 MG tablet    Sig: Take 2 tablets (80 mg total) by mouth 2 (two) times daily.    Dispense:  360 tablet    Refill:  1   Potassium Chloride ER 20 MEQ TBCR    Sig: Take 1 tablet by mouth daily.    Dispense:  90 tablet    Refill:  1   dicyclomine (BENTYL) 20 MG tablet    Sig: Take 1 tablet (20 mg total) by mouth 4 (four) times daily -  before meals and at bedtime.    Dispense:  360 tablet    Refill:  1   gabapentin (NEURONTIN) 800 MG tablet    Sig: Take 1 tablet (800 mg total) by mouth 3 (three) times daily.    Dispense:  270 tablet    Refill:  1   clopidogrel (PLAVIX) 75 MG tablet    Sig: Take 1 tablet (75 mg total) by mouth daily.     Dispense:  90 tablet    Refill:  1   pravastatin (PRAVACHOL) 40 MG tablet    Sig: Take 1 tablet (40 mg total) by mouth daily.    Dispense:  90 tablet    Refill:  1   pantoprazole (PROTONIX) 40 MG tablet    Sig: Take 1 tablet (40 mg total) by mouth daily.    Dispense:  90 tablet    Refill:  1    No orders of the defined types were placed in this encounter.    Follow-up: Return for previously scheduled follow up appt. .  An After Visit Summary was printed and given to the patient.  [name], MD [facility] [phone]

## 2021-05-05 NOTE — Assessment & Plan Note (Addendum)
Risks were discussed including bleeding, infection, increase in sugars if diabetic, atrophy at site of injection, and increased pain.  After consent was obtained, using sterile technique the handwas prepped with alcohol. Finger was injected at base with 20 mg and 0.5 ml plain Lidocaine was then injected and the needle withdrawn.  The procedure was well tolerated.   The patient is asked to continue to rest the joint for a few more days before resuming regular activities.  It may be more painful for the first 1-2 days.  Watch for fever, or increased swelling or persistent pain in the joint. Call or return to clinic prn if such symptoms occur or there is failure to improve as anticipated.

## 2021-05-14 DIAGNOSIS — I471 Supraventricular tachycardia: Secondary | ICD-10-CM | POA: Diagnosis not present

## 2021-05-14 DIAGNOSIS — I10 Essential (primary) hypertension: Secondary | ICD-10-CM | POA: Diagnosis not present

## 2021-05-14 DIAGNOSIS — I6523 Occlusion and stenosis of bilateral carotid arteries: Secondary | ICD-10-CM | POA: Diagnosis not present

## 2021-05-26 ENCOUNTER — Encounter: Payer: Self-pay | Admitting: Family Medicine

## 2021-05-28 ENCOUNTER — Other Ambulatory Visit: Payer: Self-pay

## 2021-05-28 MED ORDER — MORPHINE SULFATE ER 30 MG PO TBCR: 30.0000 mg | EXTENDED_RELEASE_TABLET | Freq: Three times a day (TID) | ORAL | 0 refills | Status: DC

## 2021-05-29 DIAGNOSIS — N390 Urinary tract infection, site not specified: Secondary | ICD-10-CM | POA: Diagnosis not present

## 2021-05-29 DIAGNOSIS — N952 Postmenopausal atrophic vaginitis: Secondary | ICD-10-CM | POA: Diagnosis not present

## 2021-05-29 DIAGNOSIS — R3911 Hesitancy of micturition: Secondary | ICD-10-CM | POA: Diagnosis not present

## 2021-05-29 DIAGNOSIS — R350 Frequency of micturition: Secondary | ICD-10-CM | POA: Diagnosis not present

## 2021-06-02 DIAGNOSIS — H40013 Open angle with borderline findings, low risk, bilateral: Secondary | ICD-10-CM | POA: Diagnosis not present

## 2021-06-04 DIAGNOSIS — I10 Essential (primary) hypertension: Secondary | ICD-10-CM | POA: Diagnosis not present

## 2021-06-04 DIAGNOSIS — I6523 Occlusion and stenosis of bilateral carotid arteries: Secondary | ICD-10-CM | POA: Diagnosis not present

## 2021-06-10 NOTE — Progress Notes (Incomplete)
Subjective:  Patient ID: Donna Little, female    DOB: 1945/10/22  Age: 76 y.o. MRN: [id number]  Chief Complaint  Patient presents with   Hyperlipidemia   Gastroesophageal Reflux   HPI: Hyperlipidemia:  Patient is currently taking atorvastatin 40 mg 1 tablet daily.  Depression:  Patient is currently taking Celexa 20 mg take 1 tablet daily.  GERD: Patient is currently taking Pantoprazole 40 mg take 1 tablet daily.  Chronic Pain Syndrome: Patient has chronic pain related to her entire back.  8/10. Patient has osteoporosis and has had compression fractures.  She is on gabapentin 800 mg 3 times daily, lidoderm patches, and MS Contin 30 mg 3 times daily. Tries to take twice daily. Patient is not candidate for surgery. Has narcan.   Patient saw eye doctor. He identified a cholesterol placque that went to her eye. He referred her back to cardiology, who ordered carotid dopplers. She does not know results yet. No medication changes. Already on plavix and lipitor.   Patient having left eye pain and headache. Sharp stabbing pains about once a week.  Only lasts for less than 5 seconds.  IBS: on bentyl 20 mg before meals.  Hypertension:  Furosemide 40 mg 2 pills twice a day.    Tobacco use: 1 pack every 2 weeks.   Urinary hesitancy improved with tamsulosin. Patient sees Urology.   Current Outpatient Medications on File Prior to Visit  Medication Sig Dispense Refill   acetaminophen (TYLENOL) 325 MG tablet Take by mouth.     albuterol (VENTOLIN HFA) 108 (90 Base) MCG/ACT inhaler Inhale 2 puffs into the lungs every 6 (six) hours as needed.      citalopram (CELEXA) 20 MG tablet Take 1 tablet (20 mg total) by mouth daily. 90 tablet 3   clopidogrel (PLAVIX) 75 MG tablet Take 1 tablet (75 mg total) by mouth daily. 90 tablet 1   diclofenac Sodium (VOLTAREN) 1 % GEL Apply 2 g topically 4 (four) times daily as needed.      dicyclomine (BENTYL) 20 MG tablet Take 1 tablet (20 mg total) by  mouth 4 (four) times daily -  before meals and at bedtime. 360 tablet 1   furosemide (LASIX) 40 MG tablet Take 2 tablets (80 mg total) by mouth 2 (two) times daily. 360 tablet 1   gabapentin (NEURONTIN) 800 MG tablet Take 1 tablet (800 mg total) by mouth 3 (three) times daily. 270 tablet 1   hydrALAZINE (APRESOLINE) 25 MG tablet Take 25 mg by mouth as needed (DAILY if blood pressure is 180 or greater). Take one daily if blood pressure is 180 or greater     lidocaine (LIDODERM) 5 % Place 3 patches onto the skin daily. Remove & Discard patch within 12 hours or as directed by MD 90 patch 3   meclizine (ANTIVERT) 25 MG tablet Take 25 mg by mouth 3 (three) times daily as needed.      morphine (MS CONTIN) 30 MG 12 hr tablet Take 1 tablet (30 mg total) by mouth in the morning, at noon, and at bedtime. 90 tablet 0   naloxone (NARCAN) nasal spray 4 mg/0.1 mL 1 spray (4 mg) intranasally into 1 nostril. Use a new Narcan(R) nasal spray for subsequent doses and administer into alternating nostrils. May repeat every 2 to 3 minutes 2 each 1   nitroGLYCERIN (NITROSTAT) 0.4 MG SL tablet Place under the tongue.     ondansetron (ZOFRAN-ODT) 4 MG disintegrating tablet DISSOLVE 1 TO 2 TABLETS  IN MOUTH EVERY 8 HOURS AS NEEDED FOR NAUSEA 40 tablet 0   pantoprazole (PROTONIX) 40 MG tablet Take 1 tablet (40 mg total) by mouth daily. 90 tablet 1   Potassium Chloride ER 20 MEQ TBCR Take 1 tablet by mouth daily. 90 tablet 1   pramipexole (MIRAPEX) 0.25 MG tablet Take 1 tablet (0.25 mg total) by mouth at bedtime as needed. 1 tablet 0   pravastatin (PRAVACHOL) 40 MG tablet Take 1 tablet (40 mg total) by mouth daily. 90 tablet 1   No current facility-administered medications on file prior to visit.   Past Medical History:  Diagnosis Date   A-fib [facility])    Atherosclerosis of both carotid arteries 2022   noted per CT on 05/14/20   Chronic pain syndrome    Chronic systolic heart failure ([place])    Depression     Emphysema of lung ([place]) 2022   noted per CT scan on 05/14/20   Generalized atherosclerosis    GERD (gastroesophageal reflux disease)    Hypertension    IBS (irritable bowel syndrome)    Melanoma ([place])    Other specified nontoxic goiter    Palpitations    Polymyalgia rheumatica (HCC)    Primary pulmonary hypertension ([place])    Pulmonary embolism ([place])    Stroke ([place])    ministroke   Supraventricular tachycardia ([place])    Type 2 diabetes mellitus ([place])    Past Surgical History:  Procedure Laterality Date   ABDOMINAL HYSTERECTOMY     APPENDECTOMY     CATARACT EXTRACTION     CORONARY ANGIOPLASTY WITH STENT PLACEMENT     KYPHOPLASTY  05/2020   L4-L5    Family History  Problem Relation Age of Onset   Heart disease Mother    Diabetes Mother    Hyperlipidemia Mother    Hypertension Mother    Stroke Mother    Heart attack Mother    Heart disease Brother    Heart attack Brother    Stroke Brother    Hypertension Brother    Hyperlipidemia Brother    Social History   Socioeconomic History   Marital status: Single    Spouse name: Not on file   Number of children: 2   Years of education: Not on file   Highest education level: Not on file  Occupational History   Occupation: retired  Tobacco Use   Smoking status: Light Smoker    Packs/day: 0.33    Years: 50.00    Pack years: 16.50    Types: Cigarettes   Smokeless tobacco: Never   Tobacco comments:    smokes 1-2 cigarettes per week.  Vaping Use   Vaping Use: Never used  Substance and Sexual Activity   Alcohol use: No    Alcohol/week: 0.0 standard drinks   Drug use: No   Sexual activity: Not on file  Other Topics Concern   Not on file  Social History Narrative   Not on file   Social Determinants of Health   Financial Resource Strain: Not on file  Food Insecurity: Not on file  Transportation Needs: Not on file  Physical Activity: Not on file  Stress: Not on file   Social Connections: Not on file    Review of Systems  Constitutional:  Positive for fatigue. Negative for appetite change and fever.  HENT:  Positive for rhinorrhea. Negative for congestion, ear pain, sinus pressure and sore throat.   Eyes:  Positive for visual disturbance.  Respiratory:  Positive for cough  and shortness of breath. Negative for chest tightness and wheezing.   Cardiovascular:  Negative for chest pain and palpitations.  Gastrointestinal:  Positive for nausea. Negative for abdominal pain, constipation, diarrhea and vomiting.  Genitourinary:  Positive for frequency. Negative for dysuria and hematuria.  Musculoskeletal:  Positive for arthralgias (bilateral hip and shoulder pain) and back pain. Negative for joint swelling and myalgias.  Skin:  Negative for rash.  Neurological:  Negative for dizziness, weakness and headaches.  Psychiatric/Behavioral:  Negative for dysphoric mood. The patient is not nervous/anxious.        Irritability    Objective:  There were no vitals taken for this visit.  BP/Weight 05/05/2021 04/09/2021 62/13/0865  Systolic BP 784 696 295  Diastolic BP 52 54 60  Wt. (Lbs) 116 112 119  BMI 20.55 19.84 21.08    Physical Exam Vitals reviewed.  Constitutional:      Appearance: Normal appearance. She is normal weight.  Neck:     Vascular: No carotid bruit.  Cardiovascular:     Rate and Rhythm: Normal rate and regular rhythm.     Heart sounds: Normal heart sounds.  Pulmonary:     Effort: Pulmonary effort is normal. No respiratory distress.     Breath sounds: Normal breath sounds.  Abdominal:     General: Abdomen is flat. Bowel sounds are normal.     Palpations: Abdomen is soft.     Tenderness: There is no abdominal tenderness.  Neurological:     Mental Status: She is alert and oriented to person, place, and time.     Gait: Gait abnormal (leans over at 70 degree angle from waist. tender over tspine and lspine.).     Comments: Nontender over left  temple  Psychiatric:        Mood and Affect: Mood normal.        Behavior: Behavior normal.    Diabetic Foot Exam - Simple   No data filed      Lab Results  Component Value Date   WBC 7.3 03/05/2021   HGB 14.0 03/05/2021   HCT 40.8 03/05/2021   PLT 200 03/05/2021   GLUCOSE 88 03/05/2021   CHOL 145 11/19/2020   TRIG 139 11/19/2020   HDL 38 (L) 11/19/2020   LDLCALC 82 11/19/2020   ALT 10 03/05/2021   AST 20 03/05/2021   NA 141 03/05/2021   K 5.3 (H) 03/05/2021   CL 100 03/05/2021   CREATININE 0.98 03/05/2021   BUN 17 03/05/2021   CO2 27 03/05/2021   TSH 0.675 03/05/2021   HGBA1C 5.5 06/27/2020   MICROALBUR 30 01/20/2020      Assessment & Plan:   Problem List Items Addressed This Visit       Cardiovascular and Mediastinum   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  No orders of the defined types were placed in this encounter.   Total time spent on today's visit was greater than 30 minutes, including both face-to-face time and nonface-to-face time personally spent  on review of chart (labs and imaging), discussing labs and goals, discussing further work-up, treatment options, referrals to specialist if needed, reviewing outside records of pertinent, answering patient's questions, and coordinating care.   Follow-up: No follow-ups on file.  An After Visit Summary was printed and given to the patient.  [name], MD [facility] [phone]

## 2021-06-11 ENCOUNTER — Ambulatory Visit (INDEPENDENT_AMBULATORY_CARE_PROVIDER_SITE_OTHER): Payer: Medicare Other | Admitting: Family Medicine

## 2021-06-11 ENCOUNTER — Other Ambulatory Visit: Payer: Self-pay

## 2021-06-11 ENCOUNTER — Encounter: Payer: Self-pay | Admitting: Family Medicine

## 2021-06-11 ENCOUNTER — Ambulatory Visit (INDEPENDENT_AMBULATORY_CARE_PROVIDER_SITE_OTHER): Payer: Medicare Other

## 2021-06-11 VITALS — BP 110/60 | HR 72 | Temp 97.1°F | Resp 16 | Ht 59.0 in | Wt 117.0 lb

## 2021-06-11 DIAGNOSIS — G8929 Other chronic pain: Secondary | ICD-10-CM

## 2021-06-11 DIAGNOSIS — I700 Atherosclerosis of aorta: Secondary | ICD-10-CM

## 2021-06-11 DIAGNOSIS — K219 Gastro-esophageal reflux disease without esophagitis: Secondary | ICD-10-CM

## 2021-06-11 DIAGNOSIS — E1169 Type 2 diabetes mellitus with other specified complication: Secondary | ICD-10-CM

## 2021-06-11 DIAGNOSIS — Z23 Encounter for immunization: Secondary | ICD-10-CM | POA: Diagnosis not present

## 2021-06-11 DIAGNOSIS — F330 Major depressive disorder, recurrent, mild: Secondary | ICD-10-CM

## 2021-06-11 DIAGNOSIS — I110 Hypertensive heart disease with heart failure: Secondary | ICD-10-CM

## 2021-06-11 DIAGNOSIS — I679 Cerebrovascular disease, unspecified: Secondary | ICD-10-CM

## 2021-06-11 DIAGNOSIS — F1120 Opioid dependence, uncomplicated: Secondary | ICD-10-CM

## 2021-06-11 DIAGNOSIS — J449 Chronic obstructive pulmonary disease, unspecified: Secondary | ICD-10-CM

## 2021-06-11 DIAGNOSIS — Z720 Tobacco use: Secondary | ICD-10-CM

## 2021-06-11 DIAGNOSIS — E782 Mixed hyperlipidemia: Secondary | ICD-10-CM

## 2021-06-11 DIAGNOSIS — M546 Pain in thoracic spine: Secondary | ICD-10-CM

## 2021-06-12 LAB — COMPREHENSIVE METABOLIC PANEL
ALT: 10 IU/L (ref 0–32)
AST: 19 IU/L (ref 0–40)
Albumin/Globulin Ratio: 1.7 (ref 1.2–2.2)
Albumin: 4.3 g/dL (ref 3.7–4.7)
Alkaline Phosphatase: 84 IU/L (ref 44–121)
BUN/Creatinine Ratio: 18 (ref 12–28)
BUN: 19 mg/dL (ref 8–27)
Bilirubin Total: 0.2 mg/dL (ref 0.0–1.2)
CO2: 29 mmol/L (ref 20–29)
Calcium: 9 mg/dL (ref 8.7–10.3)
Chloride: 98 mmol/L (ref 96–106)
Creatinine, Ser: 1.03 mg/dL — ABNORMAL HIGH (ref 0.57–1.00)
Globulin, Total: 2.5 g/dL (ref 1.5–4.5)
Glucose: 93 mg/dL (ref 70–99)
Potassium: 5.6 mmol/L — ABNORMAL HIGH (ref 3.5–5.2)
Sodium: 139 mmol/L (ref 134–144)
Total Protein: 6.8 g/dL (ref 6.0–8.5)
eGFR: 56 mL/min/{1.73_m2} — ABNORMAL LOW (ref >59)

## 2021-06-12 LAB — CBC WITH DIFF/PLATELET
Basophils Absolute: 0 10*3/uL (ref 0.0–0.2)
Basos: 1 %
EOS (ABSOLUTE): 0.2 10*3/uL (ref 0.0–0.4)
Eos: 3 %
Hematocrit: 43.9 % (ref 34.0–46.6)
Hemoglobin: 14.9 g/dL (ref 11.1–15.9)
Immature Grans (Abs): 0 10*3/uL (ref 0.0–0.1)
Immature Granulocytes: 0 %
Lymphocytes Absolute: 1.4 10*3/uL (ref 0.7–3.1)
Lymphs: 21 %
MCH: 32 pg (ref 26.6–33.0)
MCHC: 33.9 g/dL (ref 31.5–35.7)
MCV: 94 fL (ref 79–97)
Monocytes Absolute: 0.6 10*3/uL (ref 0.1–0.9)
Monocytes: 10 %
Neutrophils Absolute: 4.2 10*3/uL (ref 1.4–7.0)
Neutrophils: 65 %
Platelets: 193 10*3/uL (ref 150–450)
RBC: 4.65 x10E6/uL (ref 3.77–5.28)
RDW: 13.6 % (ref 11.7–15.4)
WBC: 6.5 10*3/uL (ref 3.4–10.8)

## 2021-06-12 LAB — LIPID PANEL
Chol/HDL Ratio: 2.8 ratio (ref 0.0–4.4)
Cholesterol, Total: 117 mg/dL (ref 100–199)
HDL: 42 mg/dL (ref >39)
LDL Chol Calc (NIH): 62 mg/dL (ref 0–99)
Triglycerides: 60 mg/dL (ref 0–149)
VLDL Cholesterol Cal: 13 mg/dL (ref 5–40)

## 2021-06-12 LAB — CARDIOVASCULAR RISK ASSESSMENT

## 2021-06-12 NOTE — Progress Notes (Signed)
Blood count normal.  ?Liver function normal.  ?Kidney function normal.  ?Potassium little high. Recommend repeat cmp in 1-2 weeks. ?Cholesterol: well controlled  ? ?

## 2021-06-16 DIAGNOSIS — H5213 Myopia, bilateral: Secondary | ICD-10-CM | POA: Diagnosis not present

## 2021-06-16 DIAGNOSIS — H40013 Open angle with borderline findings, low risk, bilateral: Secondary | ICD-10-CM | POA: Diagnosis not present

## 2021-06-22 NOTE — Assessment & Plan Note (Signed)
>>  ASSESSMENT AND PLAN FOR TOBACCO ABUSE WRITTEN ON 06/22/2021  9:22 PM BY COX, KIRSTEN, MD  Recommend cessation. No desire to quit.

## 2021-06-22 NOTE — Assessment & Plan Note (Signed)
The current medical regimen is effective;  continue present plan and medications. ?Continue mscontin 30 mg twice daily.  ?Continue gabapentin 800 mg three times a day.  ?

## 2021-06-22 NOTE — Assessment & Plan Note (Signed)
Recommend use trelegy one inhalation daily.  ?Use albuterol four times a day as needed.  ?

## 2021-06-22 NOTE — Assessment & Plan Note (Signed)
Recommend cessation. ?No desire to quit. ?

## 2021-06-22 NOTE — Assessment & Plan Note (Signed)
Well controlled.  ?No changes to medicines. Continue atorvastatin 40 mg 1 tablet daily. ?Continue to work on eating a healthy diet and exercise.  ?Labs drawn today.  ? ?

## 2021-06-22 NOTE — Assessment & Plan Note (Signed)
Continue plavix and lipitor. ?

## 2021-06-22 NOTE — Assessment & Plan Note (Signed)
Has naloxone.  ?

## 2021-06-22 NOTE — Assessment & Plan Note (Signed)
Control: good ?A1C excellent. ?Continue to work on eating a healthy diet and exercise.  ?Labs drawn today.   ? ?

## 2021-06-22 NOTE — Assessment & Plan Note (Signed)
The current medical regimen is effective;  continue present plan and medications. Continue celexa 20 mg daily  ? ?

## 2021-06-22 NOTE — Assessment & Plan Note (Signed)
Continue pantoprazole 40 mg daily.  ?

## 2021-06-22 NOTE — Assessment & Plan Note (Signed)
Continue plavix and lipitor.  ?

## 2021-06-22 NOTE — Assessment & Plan Note (Signed)
Well controlled.  ?No changes to medicines.  Only on lasix.  ?Continue to work on eating a healthy diet and exercise.  ?Labs drawn today.  ? ?

## 2021-06-25 ENCOUNTER — Ambulatory Visit: Payer: Medicare Other

## 2021-06-25 ENCOUNTER — Other Ambulatory Visit: Payer: Self-pay

## 2021-06-25 DIAGNOSIS — E875 Hyperkalemia: Secondary | ICD-10-CM | POA: Diagnosis not present

## 2021-06-26 LAB — COMPREHENSIVE METABOLIC PANEL
ALT: 16 IU/L (ref 0–32)
AST: 24 IU/L (ref 0–40)
Albumin/Globulin Ratio: 1.6 (ref 1.2–2.2)
Albumin: 4.4 g/dL (ref 3.7–4.7)
Alkaline Phosphatase: 86 IU/L (ref 44–121)
BUN/Creatinine Ratio: 21 (ref 12–28)
BUN: 19 mg/dL (ref 8–27)
Bilirubin Total: 0.4 mg/dL (ref 0.0–1.2)
CO2: 28 mmol/L (ref 20–29)
Calcium: 9.4 mg/dL (ref 8.7–10.3)
Chloride: 95 mmol/L — ABNORMAL LOW (ref 96–106)
Creatinine, Ser: 0.91 mg/dL (ref 0.57–1.00)
Globulin, Total: 2.8 g/dL (ref 1.5–4.5)
Glucose: 86 mg/dL (ref 70–99)
Potassium: 4.9 mmol/L (ref 3.5–5.2)
Sodium: 140 mmol/L (ref 134–144)
Total Protein: 7.2 g/dL (ref 6.0–8.5)
eGFR: 65 mL/min/{1.73_m2} (ref >59)

## 2021-07-03 ENCOUNTER — Telehealth: Payer: Self-pay

## 2021-07-03 NOTE — Chronic Care Management (AMB) (Signed)
? ? ?Chronic Care Management ?Pharmacy Assistant  ? ?Name: Donna Little  MRN: [id number] DOB: [birth year] ? ?Reason for Encounter: Disease State/ COPD ? ?Recent office visits:  ?06-25-2021 Oleta Mouse, CMA. Lab visit for CMP. ? ?06-11-2021 [name], MD.Covid booster given. STOP pravastatin and hydralazine. Creatinine= 1.03, eGFR= 56, Potassium= 5.6. ? ?05-05-2021 [name], MD. STOP trelegy, mirtazapine and pyridium. Kenelog injection given. ? ?04-09-2021 Erie Noe, LPN. Medicare wellness visit. COMPLETED bactrim ? ?03-19-2021 Erie Noe, LPN. Prolia injection given. ? ?03-05-2021 [name], MD. eGFR= 59, Potassium= 5.3. Narcan ordered 1 spray (4 mg) intranasally into 1 nostril. Use a new Narcan(R) nasal spray for subsequent doses and administer into alternating nostrils. May repeat every 2 to 3 minutes. ? ?Recent consult visits:  ?06-04-2021 Durwin Reges, OD (Cardiology). CLIN VAS LAB-CAROTID DUPLEX (BILATERAL) completed. ? ?05-14-2021 [name], MD. (Cardiology). CUS TTE SURFACE ECHO ADULT completed. ? ?02-07-2021 Joie Bimler, MD (Urology). Unable to view encounter. ? ?02-03-2021 Rainey Pines, PAC. X-Ray Wrist 3 Views Minimum Left CHG X-RAY WRIST 3+ VW.  ? ?01-13-2021 Joie Bimler, MD (Urology). Unable to view encounter. ? ?Hospital visits:  ?None in previous 6 months ? ?Medications: ?Outpatient Encounter Medications as of 07/03/2021  ?Medication Sig  ? acetaminophen (TYLENOL) 325 MG tablet Take by mouth.  ? albuterol (VENTOLIN HFA) 108 (90 Base) MCG/ACT inhaler Inhale 2 puffs into the lungs every 6 (six) hours as needed.   ? atorvastatin (LIPITOR) 40 MG tablet Take 1 tablet by mouth daily.  ? citalopram (CELEXA) 20 MG tablet Take 1 tablet (20 mg total) by mouth daily.  ? clopidogrel (PLAVIX) 75 MG tablet Take 1 tablet (75 mg total) by mouth daily.  ? diclofenac Sodium (VOLTAREN) 1 % GEL Apply 2 g topically 4 (four) times daily as needed.   ? dicyclomine  (BENTYL) 20 MG tablet Take 1 tablet (20 mg total) by mouth 4 (four) times daily -  before meals and at bedtime.  ? estradiol (ESTRACE) 0.1 MG/GM vaginal cream Place vaginally.  ? furosemide (LASIX) 40 MG tablet Take 2 tablets (80 mg total) by mouth 2 (two) times daily.  ? gabapentin (NEURONTIN) 800 MG tablet Take 1 tablet (800 mg total) by mouth 3 (three) times daily.  ? lidocaine (LIDODERM) 5 % Place 3 patches onto the skin daily. Remove & Discard patch within 12 hours or as directed by MD  ? meclizine (ANTIVERT) 25 MG tablet Take 25 mg by mouth 3 (three) times daily as needed.   ? morphine (MS CONTIN) 30 MG 12 hr tablet Take 1 tablet (30 mg total) by mouth in the morning, at noon, and at bedtime.  ? naloxone (NARCAN) nasal spray 4 mg/0.1 mL 1 spray (4 mg) intranasally into 1 nostril. Use a new Narcan(R) nasal spray for subsequent doses and administer into alternating nostrils. May repeat every 2 to 3 minutes  ? nitroGLYCERIN (NITROSTAT) 0.4 MG SL tablet Place under the tongue.  ? ondansetron (ZOFRAN-ODT) 4 MG disintegrating tablet DISSOLVE 1 TO 2 TABLETS IN MOUTH EVERY 8 HOURS AS NEEDED FOR NAUSEA  ? pantoprazole (PROTONIX) 40 MG tablet Take 1 tablet (40 mg total) by mouth daily.  ? Potassium Chloride ER 20 MEQ TBCR Take 1 tablet by mouth daily.  ? pramipexole (MIRAPEX) 0.25 MG tablet Take 1 tablet (0.25 mg total) by mouth at bedtime as needed.  ? tamsulosin (FLOMAX) 0.4 MG CAPS capsule Take by mouth.  ? ?No facility-administered encounter medications on file  as of 07/03/2021.  ? ? ?07-03-2021: 1st attempt left VM ?07-04-2021: 2nd attempt left VM ? ?Care Gaps: ?Last annual wellness visit? 04-09-2021 ?Hep C screening overdue ?Yearly ophthalmology overdue ?Yearly foot exam overdue ? ?Star Rating Drugs: ?Atorvastatin 40 mg- Last filled 05-15-2021 90 DS ? ?Malecca Hicks CMA ?Clinical Pharmacist Assistant ?[phone] ? ?

## 2021-07-04 ENCOUNTER — Other Ambulatory Visit: Payer: Self-pay

## 2021-07-04 ENCOUNTER — Other Ambulatory Visit: Payer: Self-pay | Admitting: Family Medicine

## 2021-07-04 MED ORDER — MORPHINE SULFATE ER 30 MG PO TBCR: 30.0000 mg | EXTENDED_RELEASE_TABLET | Freq: Three times a day (TID) | ORAL | 0 refills | Status: DC

## 2021-07-04 MED ORDER — PANTOPRAZOLE SODIUM 40 MG PO TBEC: 40.0000 mg | DELAYED_RELEASE_TABLET | Freq: Every day | ORAL | 1 refills | Status: DC

## 2021-07-04 NOTE — Telephone Encounter (Signed)
Patient called and would like the following refills sent in to [facility] ? ?1- Morphine (#90 sent 05/28/21) ?2- Pantoprazole (#90 sent 05/05/21) ?

## 2021-07-10 DIAGNOSIS — R350 Frequency of micturition: Secondary | ICD-10-CM | POA: Diagnosis not present

## 2021-07-10 DIAGNOSIS — N390 Urinary tract infection, site not specified: Secondary | ICD-10-CM | POA: Diagnosis not present

## 2021-07-10 DIAGNOSIS — R3911 Hesitancy of micturition: Secondary | ICD-10-CM | POA: Diagnosis not present

## 2021-07-10 DIAGNOSIS — N952 Postmenopausal atrophic vaginitis: Secondary | ICD-10-CM | POA: Diagnosis not present

## 2021-07-30 ENCOUNTER — Telehealth: Payer: Self-pay

## 2021-07-30 NOTE — Telephone Encounter (Unsigned)
Very pleasant patient presents for telephone visit. I was unable to introduce myself and what I do, mainly listened to patient. She is nervous that she will need Sx on her eye and also because her brother is sick and not doing well.  ? ?Per patient, she is due for Prolia shot soon, coordinated with Meredith Mody to get that scheduled ?

## 2021-08-04 ENCOUNTER — Other Ambulatory Visit: Payer: Self-pay | Admitting: Family Medicine

## 2021-08-12 ENCOUNTER — Other Ambulatory Visit: Payer: Self-pay

## 2021-08-12 MED ORDER — MORPHINE SULFATE ER 30 MG PO TBCR: 30.0000 mg | EXTENDED_RELEASE_TABLET | Freq: Three times a day (TID) | ORAL | 0 refills | Status: DC

## 2021-08-13 ENCOUNTER — Telehealth: Payer: Self-pay

## 2021-08-13 DIAGNOSIS — L821 Other seborrheic keratosis: Secondary | ICD-10-CM | POA: Diagnosis not present

## 2021-08-13 DIAGNOSIS — L219 Seborrheic dermatitis, unspecified: Secondary | ICD-10-CM | POA: Diagnosis not present

## 2021-08-13 NOTE — Chronic Care Management (AMB) (Addendum)
? ? ?Chronic Care Management ?Pharmacy Assistant  ? ?Name: Donna Little  MRN: [id number] DOB: 1945/05/14 ? ? ?Reason for Encounter: Disease State/ COPD ? ?Recent office visits:  ?06-25-2021 Oleta Mouse, CMA. Lab visit for CMP. ?  ?06-11-2021 [name], MD.Covid booster given. STOP pravastatin and hydralazine. Creatinine= 1.03, eGFR= 56, Potassium= 5.6. ?  ?05-05-2021 [name], MD. STOP trelegy, mirtazapine and pyridium. Kenelog injection given. ?  ?04-09-2021 Erie Noe, LPN. Medicare wellness visit. COMPLETED bactrim ?  ?03-19-2021 Erie Noe, LPN. Prolia injection given. ?  ?03-05-2021 [name], MD. eGFR= 59, Potassium= 5.3. Narcan ordered 1 spray (4 mg) intranasally into 1 nostril. Use a new Narcan(R) nasal spray for subsequent doses and administer into alternating nostrils. May repeat every 2 to 3 minutes. ? ?Recent consult visits:  ?06-04-2021 Durwin Reges, OD (Cardiology). CLIN VAS LAB-CAROTID DUPLEX (BILATERAL) completed. ?  ?05-14-2021 [name], MD. (Cardiology). CUS TTE SURFACE ECHO ADULT completed. ? ?Hospital visits:  ?None in previous 6 months ? ?Medications: ?Outpatient Encounter Medications as of 08/13/2021  ?Medication Sig  ? acetaminophen (TYLENOL) 325 MG tablet Take by mouth.  ? albuterol (VENTOLIN HFA) 108 (90 Base) MCG/ACT inhaler Inhale 2 puffs into the lungs every 6 (six) hours as needed.   ? atorvastatin (LIPITOR) 40 MG tablet Take 1 tablet by mouth daily.  ? citalopram (CELEXA) 20 MG tablet Take 1 tablet (20 mg total) by mouth daily.  ? clopidogrel (PLAVIX) 75 MG tablet Take 1 tablet (75 mg total) by mouth daily.  ? diclofenac Sodium (VOLTAREN) 1 % GEL Apply 2 g topically 4 (four) times daily as needed.   ? dicyclomine (BENTYL) 20 MG tablet Take 1 tablet (20 mg total) by mouth 4 (four) times daily -  before meals and at bedtime.  ? estradiol (ESTRACE) 0.1 MG/GM vaginal cream Place vaginally.  ? furosemide (LASIX) 40 MG tablet Take 2 tablets  (80 mg total) by mouth 2 (two) times daily.  ? gabapentin (NEURONTIN) 800 MG tablet Take 1 tablet (800 mg total) by mouth 3 (three) times daily.  ? KLOR-CON M20 20 MEQ tablet TAKE 1 TABLET BY MOUTH TWICE DAILY WITH A MEAL  ? lidocaine (LIDODERM) 5 % Place 3 patches onto the skin daily. Remove & Discard patch within 12 hours or as directed by MD  ? meclizine (ANTIVERT) 25 MG tablet Take 25 mg by mouth 3 (three) times daily as needed.   ? morphine (MS CONTIN) 30 MG 12 hr tablet Take 1 tablet (30 mg total) by mouth in the morning, at noon, and at bedtime.  ? naloxone (NARCAN) nasal spray 4 mg/0.1 mL 1 spray (4 mg) intranasally into 1 nostril. Use a new Narcan(R) nasal spray for subsequent doses and administer into alternating nostrils. May repeat every 2 to 3 minutes  ? nitroGLYCERIN (NITROSTAT) 0.4 MG SL tablet Place under the tongue.  ? ondansetron (ZOFRAN-ODT) 4 MG disintegrating tablet DISSOLVE 1 TO 2 TABLETS IN MOUTH EVERY 8 HOURS AS NEEDED FOR NAUSEA  ? pantoprazole (PROTONIX) 40 MG tablet Take 1 tablet (40 mg total) by mouth daily.  ? Potassium Chloride ER 20 MEQ TBCR Take 1 tablet by mouth daily.  ? pramipexole (MIRAPEX) 0.25 MG tablet Take 1 tablet (0.25 mg total) by mouth at bedtime as needed.  ? tamsulosin (FLOMAX) 0.4 MG CAPS capsule Take by mouth.  ? ?No facility-administered encounter medications on file as of 08/13/2021.  ? ?Current COPD regimen:  ?Albuterol every 6 hours as ?needed ?   ?  View : No data to display.  ?  ?  ?  ? ? ?Any recent hospitalizations or ED visits since last visit with CPP? No ? ?reports the following and denies COPD symptoms, including Increased shortness of breath  and Symptoms worse at night ?  ?What recent interventions/DTPs have been made by any provider to improve breathing since last visit: ?None ?  ?Have you had exacerbation/flare-up since last visit? No ? ?What do you do when you are short of breath?  Rest ? ?Current tobacco use? Patient stated she smokes 10 cigarettes  daily. ?Interested in cessation? No, Patient stated she will quit in the future. ? ?Respiratory Devices/Equipment ?Do you have a nebulizer? No ?Do you use a Peak Flow Meter? No ?Do you use a maintenance inhaler? No ?How often do you forget to use your daily inhaler? Patient doesn't use daily inhaler ?Do you use a rescue inhaler? Yes ?How often do you use your rescue inhaler?  3-5x times per week ?Do you use a spacer with your inhaler? No ? ?NOTES: ?Patient stated she feels oxygen at night would benefit her breathing. Sent Dr. Tobie Poet and Lauren CMA a message with patient's concerns. ? ?Adherence Review: ?Does the patient have >5 day gap between last estimated fill date for maintenance inhaler medications? No maintenance inhaler. ? ?Care Gaps: ?Last annual wellness visit? 04-09-2021 ?Hep C screening overdue ?Yearly ophthalmology overdue ?Yearly foot exam overdue ?6 month A1C overdue ? ?Star Rating Drugs: ?Atorvastatin 40 mg- Last filled 08-04-2021 90 DS ? ?Malecca Hicks CMA ?Clinical Pharmacist Assistant ?[phone] ? ?

## 2021-08-20 DIAGNOSIS — I358 Other nonrheumatic aortic valve disorders: Secondary | ICD-10-CM | POA: Diagnosis not present

## 2021-08-21 ENCOUNTER — Telehealth: Payer: Self-pay

## 2021-08-21 NOTE — Chronic Care Management (AMB) (Signed)
    Chronic Care Management Pharmacy Assistant   Name: INARI SHIN  MRN: [id number] DOB: [birth year]  Reason for Encounter: Prolia Benefit Verification  Patient last Prolia injection was on 03/19/2021, next injection due 09/18/2021. Benefit verfication performed on 07/30/2021, coverage details:  Physician Purchase available, No prior authorization required. Benefits subject to a $226.00 deductible ($226.00 met) and 20% co-insurance for the administration and cost of Prolia. The benefits provided on this Verification of Benefits form are Medical Benefits and are the patient's In-Network benefits for Prolia. Notifying clinical team to order Prolia injection for patient next shot, office will call to schedule injection.    Medications: Outpatient Encounter Medications as of 08/21/2021  Medication Sig   acetaminophen (TYLENOL) 325 MG tablet Take by mouth.   albuterol (VENTOLIN HFA) 108 (90 Base) MCG/ACT inhaler Inhale 2 puffs into the lungs every 6 (six) hours as needed.    atorvastatin (LIPITOR) 40 MG tablet Take 1 tablet by mouth daily.   citalopram (CELEXA) 20 MG tablet Take 1 tablet (20 mg total) by mouth daily.   clopidogrel (PLAVIX) 75 MG tablet Take 1 tablet (75 mg total) by mouth daily.   diclofenac Sodium (VOLTAREN) 1 % GEL Apply 2 g topically 4 (four) times daily as needed.    dicyclomine (BENTYL) 20 MG tablet Take 1 tablet (20 mg total) by mouth 4 (four) times daily -  before meals and at bedtime.   estradiol (ESTRACE) 0.1 MG/GM vaginal cream Place vaginally.   furosemide (LASIX) 40 MG tablet Take 2 tablets (80 mg total) by mouth 2 (two) times daily.   gabapentin (NEURONTIN) 800 MG tablet Take 1 tablet (800 mg total) by mouth 3 (three) times daily.   KLOR-CON M20 20 MEQ tablet TAKE 1 TABLET BY MOUTH TWICE DAILY WITH A MEAL   lidocaine (LIDODERM) 5 % Place 3 patches onto the skin daily. Remove & Discard patch within 12 hours or as directed by MD   meclizine (ANTIVERT) 25 MG tablet Take  25 mg by mouth 3 (three) times daily as needed.    morphine (MS CONTIN) 30 MG 12 hr tablet Take 1 tablet (30 mg total) by mouth in the morning, at noon, and at bedtime.   naloxone (NARCAN) nasal spray 4 mg/0.1 mL 1 spray (4 mg) intranasally into 1 nostril. Use a new Narcan(R) nasal spray for subsequent doses and administer into alternating nostrils. May repeat every 2 to 3 minutes   nitroGLYCERIN (NITROSTAT) 0.4 MG SL tablet Place under the tongue.   ondansetron (ZOFRAN-ODT) 4 MG disintegrating tablet DISSOLVE 1 TO 2 TABLETS IN MOUTH EVERY 8 HOURS AS NEEDED FOR NAUSEA   pantoprazole (PROTONIX) 40 MG tablet Take 1 tablet (40 mg total) by mouth daily.   Potassium Chloride ER 20 MEQ TBCR Take 1 tablet by mouth daily.   pramipexole (MIRAPEX) 0.25 MG tablet Take 1 tablet (0.25 mg total) by mouth at bedtime as needed.   tamsulosin (FLOMAX) 0.4 MG CAPS capsule Take by mouth.   No facility-administered encounter medications on file as of 08/21/2021.    Pattricia Boss, [place] Pharmacist Assistant [phone]

## 2021-08-28 ENCOUNTER — Other Ambulatory Visit: Payer: Self-pay

## 2021-08-28 MED ORDER — CLOPIDOGREL BISULFATE 75 MG PO TABS: 75.0000 mg | ORAL_TABLET | Freq: Every day | ORAL | 1 refills | Status: DC

## 2021-09-05 ENCOUNTER — Other Ambulatory Visit: Payer: Self-pay

## 2021-09-05 DIAGNOSIS — R110 Nausea: Secondary | ICD-10-CM

## 2021-09-05 MED ORDER — ONDANSETRON 4 MG PO TBDP: ORAL_TABLET | ORAL | 0 refills | Status: DC

## 2021-09-15 ENCOUNTER — Telehealth: Payer: Medicare Other

## 2021-09-18 ENCOUNTER — Ambulatory Visit: Payer: Medicare Other

## 2021-09-18 ENCOUNTER — Ambulatory Visit (INDEPENDENT_AMBULATORY_CARE_PROVIDER_SITE_OTHER): Payer: Medicare Other

## 2021-09-18 DIAGNOSIS — M810 Age-related osteoporosis without current pathological fracture: Secondary | ICD-10-CM

## 2021-09-18 MED ORDER — DENOSUMAB 60 MG/ML ~~LOC~~ SOSY
60.0000 mg | PREFILLED_SYRINGE | Freq: Once | SUBCUTANEOUS | Status: AC
  Administered 2021-09-18: 60 mg via SUBCUTANEOUS

## 2021-09-29 ENCOUNTER — Ambulatory Visit (INDEPENDENT_AMBULATORY_CARE_PROVIDER_SITE_OTHER): Payer: Medicare Other

## 2021-09-29 ENCOUNTER — Other Ambulatory Visit: Payer: Self-pay | Admitting: Family Medicine

## 2021-09-29 DIAGNOSIS — E782 Mixed hyperlipidemia: Secondary | ICD-10-CM

## 2021-09-29 DIAGNOSIS — G8929 Other chronic pain: Secondary | ICD-10-CM

## 2021-09-29 DIAGNOSIS — M5430 Sciatica, unspecified side: Secondary | ICD-10-CM

## 2021-09-29 DIAGNOSIS — I110 Hypertensive heart disease with heart failure: Secondary | ICD-10-CM

## 2021-09-30 ENCOUNTER — Other Ambulatory Visit: Payer: Self-pay

## 2021-09-30 MED ORDER — MORPHINE SULFATE ER 30 MG PO TBCR: 30.0000 mg | EXTENDED_RELEASE_TABLET | Freq: Three times a day (TID) | ORAL | 0 refills | Status: DC

## 2021-10-03 DIAGNOSIS — I110 Hypertensive heart disease with heart failure: Secondary | ICD-10-CM

## 2021-10-03 DIAGNOSIS — E782 Mixed hyperlipidemia: Secondary | ICD-10-CM

## 2021-10-14 ENCOUNTER — Telehealth: Payer: Self-pay

## 2021-10-14 NOTE — Chronic Care Management (AMB) (Signed)
Chronic Care Management Pharmacy Assistant   Name: Donna Little  MRN: [id number] DOB: 10/24/1945  Reason for Encounter: Disease State/ General  Recent office visits:  None  Recent consult visits:  None  Hospital visits:  None in previous 6 months  Medications: Outpatient Encounter Medications as of 10/14/2021  Medication Sig   acetaminophen (TYLENOL) 325 MG tablet Take by mouth.   albuterol (VENTOLIN HFA) 108 (90 Base) MCG/ACT inhaler Inhale 2 puffs into the lungs every 6 (six) hours as needed.    atorvastatin (LIPITOR) 40 MG tablet Take 1 tablet by mouth daily.   citalopram (CELEXA) 20 MG tablet Take 1 tablet (20 mg total) by mouth daily.   clopidogrel (PLAVIX) 75 MG tablet Take 1 tablet (75 mg total) by mouth daily.   diclofenac Sodium (VOLTAREN) 1 % GEL Apply 2 g topically 4 (four) times daily as needed.    dicyclomine (BENTYL) 20 MG tablet Take 1 tablet (20 mg total) by mouth 4 (four) times daily -  before meals and at bedtime.   estradiol (ESTRACE) 0.1 MG/GM vaginal cream Place vaginally.   furosemide (LASIX) 40 MG tablet Take 2 tablets (80 mg total) by mouth 2 (two) times daily.   gabapentin (NEURONTIN) 800 MG tablet Take 1 tablet (800 mg total) by mouth 3 (three) times daily.   KLOR-CON M20 20 MEQ tablet TAKE 1 TABLET BY MOUTH TWICE DAILY WITH A MEAL   lidocaine (LIDODERM) 5 % Place 3 patches onto the skin daily. Remove & Discard patch within 12 hours or as directed by MD   meclizine (ANTIVERT) 25 MG tablet Take 25 mg by mouth 3 (three) times daily as needed.    morphine (MS CONTIN) 30 MG 12 hr tablet Take 1 tablet (30 mg total) by mouth in the morning, at noon, and at bedtime.   naloxone (NARCAN) nasal spray 4 mg/0.1 mL 1 spray (4 mg) intranasally into 1 nostril. Use a new Narcan(R) nasal spray for subsequent doses and administer into alternating nostrils. May repeat every 2 to 3 minutes   nitroGLYCERIN (NITROSTAT) 0.4 MG SL tablet Place under the tongue.   ondansetron  (ZOFRAN-ODT) 4 MG disintegrating tablet DISSOLVE 1 TO 2 TABLETS IN MOUTH EVERY 8 HOURS AS NEEDED FOR NAUSEA   pantoprazole (PROTONIX) 40 MG tablet Take 1 tablet (40 mg total) by mouth daily.   Potassium Chloride ER 20 MEQ TBCR Take 1 tablet by mouth daily.   pramipexole (MIRAPEX) 0.25 MG tablet Take 1 tablet (0.25 mg total) by mouth at bedtime as needed.   tamsulosin (FLOMAX) 0.4 MG CAPS capsule Take by mouth.   No facility-administered encounter medications on file as of 10/14/2021.   Contacted Donna Little for General Review Call   Chart Review:  Have there been any documented new, changed, or discontinued medications since last visit? No Has there been any documented recent hospitalizations or ED visits since last visit with Clinical Pharmacist? No Brief Summary: None Adherence Review:  Does the Clinical Pharmacist Assistant have access to adherence rates? Yes Adherence rates for STAR metric medications (Atorvastatin 40 mg- Last filled 08-04-2021 90 DS [facility]. Previous filled 05-15-2021 90 DS). Adherence rates for medications indicated for disease state being reviewed (None). Does the patient have >5 day gap between last estimated fill dates for any of the above medications or other medication gaps? No Reason for medication gaps.: None   Disease State Questions:  Able to connect with Patient? Yes  Did patient have any problems with  their health recently? No  Have you had any admissions or emergency room visits or worsening of your condition(s) since last visit? No  Have you had any visits with new specialists or providers since your last visit? No  Have you had any new health care problem(s) since your last visit? No  Have you run out of any of your medications since you last spoke with clinical pharmacist? No  Are there any medications you are not taking as prescribed? No  Are you having any issues or side effects with your medications? No  Do you have any other health  concerns or questions you want to discuss with your Clinical Pharmacist before your next visit? No  Are there any health concerns that you feel we can do a better job addressing? No  Are you having any problems with any of the following since the last visit: (select all that apply)  None  12. Any falls since last visit? No  13. Any increased or uncontrolled pain since last visit? No   14. Next visit Type: telephone       Visit with: Donna Little        Date: 03-10-2022        Time: 1:00  15. Additional Details? No   Care Gaps: Hep C screening overdue Yearly ophthalmology overdue Yearly foot exam overdue A1C overdue Last annual wellness visit? 04-09-2021  Star Rating Drugs: Atorvastatin 40 mg- Last filled 08-04-2021 90 DS [place] Clinical Pharmacist Assistant [phone]

## 2021-10-24 DIAGNOSIS — M545 Low back pain, unspecified: Secondary | ICD-10-CM | POA: Diagnosis not present

## 2021-10-24 DIAGNOSIS — M546 Pain in thoracic spine: Secondary | ICD-10-CM | POA: Diagnosis not present

## 2021-10-29 DIAGNOSIS — M545 Low back pain, unspecified: Secondary | ICD-10-CM | POA: Diagnosis not present

## 2021-10-29 DIAGNOSIS — M546 Pain in thoracic spine: Secondary | ICD-10-CM | POA: Diagnosis not present

## 2021-11-03 ENCOUNTER — Other Ambulatory Visit: Payer: Self-pay | Admitting: Family Medicine

## 2021-11-10 ENCOUNTER — Other Ambulatory Visit: Payer: Self-pay | Admitting: Family Medicine

## 2021-11-10 DIAGNOSIS — R110 Nausea: Secondary | ICD-10-CM

## 2021-11-10 MED ORDER — MORPHINE SULFATE ER 30 MG PO TBCR: 30.0000 mg | EXTENDED_RELEASE_TABLET | Freq: Three times a day (TID) | ORAL | 0 refills | Status: DC

## 2021-11-11 ENCOUNTER — Telehealth: Payer: Self-pay

## 2021-11-11 NOTE — Chronic Care Management (AMB) (Signed)
Chronic Care Management Pharmacy Assistant   Name: Donna Little  MRN: [id number] DOB: [birth year]  Reason for Encounter: Disease State/ General  Recent office visits:  None  Recent consult visits:  None  Hospital visits:  None in previous 6 months  Medications: Outpatient Encounter Medications as of 11/11/2021  Medication Sig   acetaminophen (TYLENOL) 325 MG tablet Take by mouth.   albuterol (VENTOLIN HFA) 108 (90 Base) MCG/ACT inhaler Inhale 2 puffs into the lungs every 6 (six) hours as needed.    atorvastatin (LIPITOR) 40 MG tablet Take 1 tablet by mouth daily.   citalopram (CELEXA) 20 MG tablet Take 1 tablet (20 mg total) by mouth daily.   clopidogrel (PLAVIX) 75 MG tablet Take 1 tablet (75 mg total) by mouth daily.   diclofenac Sodium (VOLTAREN) 1 % GEL Apply 2 g topically 4 (four) times daily as needed.    dicyclomine (BENTYL) 20 MG tablet Take 1 tablet (20 mg total) by mouth 4 (four) times daily -  before meals and at bedtime.   estradiol (ESTRACE) 0.1 MG/GM vaginal cream Place vaginally.   furosemide (LASIX) 40 MG tablet Take 2 tablets by mouth twice daily   gabapentin (NEURONTIN) 800 MG tablet Take 1 tablet (800 mg total) by mouth 3 (three) times daily.   KLOR-CON M20 20 MEQ tablet TAKE 1 TABLET BY MOUTH TWICE DAILY WITH A MEAL   lidocaine (LIDODERM) 5 % Place 3 patches onto the skin daily. Remove & Discard patch within 12 hours or as directed by MD   meclizine (ANTIVERT) 25 MG tablet Take 25 mg by mouth 3 (three) times daily as needed.    morphine (MS CONTIN) 30 MG 12 hr tablet Take 1 tablet (30 mg total) by mouth in the morning, at noon, and at bedtime.   naloxone (NARCAN) nasal spray 4 mg/0.1 mL 1 spray (4 mg) intranasally into 1 nostril. Use a new Narcan(R) nasal spray for subsequent doses and administer into alternating nostrils. May repeat every 2 to 3 minutes   nitroGLYCERIN (NITROSTAT) 0.4 MG SL tablet Place under the tongue.   ondansetron (ZOFRAN-ODT) 4 MG  disintegrating tablet DISSOLVE 1 TO 2 TABLETS IN MOUTH EVERY 8 HOURS AS NEEDED FOR NAUSEA   pantoprazole (PROTONIX) 40 MG tablet Take 1 tablet (40 mg total) by mouth daily.   Potassium Chloride ER 20 MEQ TBCR Take 1 tablet by mouth daily.   pramipexole (MIRAPEX) 0.25 MG tablet Take 1 tablet (0.25 mg total) by mouth at bedtime as needed.   tamsulosin (FLOMAX) 0.4 MG CAPS capsule Take by mouth.   No facility-administered encounter medications on file as of 11/11/2021.  Contacted [name] for General Review Call   Chart Review:  Have there been any documented new, changed, or discontinued medications since last visit? No  Has there been any documented recent hospitalizations or ED visits since last visit with Clinical Pharmacist? No Brief Summary: None  Adherence Review:  Does the Clinical Pharmacist Assistant have access to adherence rates? Yes Adherence rates for STAR metric medications (Atorvastatin 40 mg- Last filled 11-04-2021 [facility]. Previous filled 08-04-2021 90 DS). Adherence rates for medications indicated for disease state being reviewed (None). Does the patient have >5 day gap between last estimated fill dates for any of the above medications or other medication gaps? No Reason for medication gaps: None   Disease State Questions:  Able to connect with Patient? Yes  Did patient have any problems with their health recently? No  Have you had any admissions or emergency room visits or worsening of your condition(s) since last visit? No  Have you had any visits with new specialists or providers since your last visit? No Explain: Have you had any new health care problem(s) since your last visit? No  Have you run out of any of your medications since you last spoke with clinical pharmacist? No  Are there any medications you are not taking as prescribed? No  Are you having any issues or side effects with your medications? No  Do you have any other health concerns or  questions you want to discuss with your Clinical Pharmacist before your next visit? No  Are there any health concerns that you feel we can do a better job addressing? No  Are you having any problems with any of the following since the last visit: (select all that apply)  None  12. Any falls since last visit? No  13. Any increased or uncontrolled pain since last visit? No   14. Next visit Type: telephone       Visit with: [place] Constable        Date: 03-10-2022        Time: 1:00  15. Additional Details? No    Care Gaps: Hep C screening overdue Yearly ophthalmology overdue Yearly foot exam overdue A1C overdue Last annual wellness visit? 04-09-2021  Star Rating Drugs: Atorvastatin 40 mg- Last filled 11-04-2021 90 DS [place] Clinical Pharmacist Assistant [phone]

## 2021-11-19 DIAGNOSIS — M545 Low back pain, unspecified: Secondary | ICD-10-CM | POA: Diagnosis not present

## 2021-11-19 DIAGNOSIS — M546 Pain in thoracic spine: Secondary | ICD-10-CM | POA: Diagnosis not present

## 2021-11-19 DIAGNOSIS — N952 Postmenopausal atrophic vaginitis: Secondary | ICD-10-CM | POA: Diagnosis not present

## 2021-11-19 DIAGNOSIS — R3911 Hesitancy of micturition: Secondary | ICD-10-CM | POA: Diagnosis not present

## 2021-11-19 DIAGNOSIS — N390 Urinary tract infection, site not specified: Secondary | ICD-10-CM | POA: Diagnosis not present

## 2021-11-19 DIAGNOSIS — R350 Frequency of micturition: Secondary | ICD-10-CM | POA: Diagnosis not present

## 2021-11-28 ENCOUNTER — Other Ambulatory Visit: Payer: Self-pay

## 2021-11-28 DIAGNOSIS — M545 Low back pain, unspecified: Secondary | ICD-10-CM | POA: Diagnosis not present

## 2021-11-28 DIAGNOSIS — M546 Pain in thoracic spine: Secondary | ICD-10-CM | POA: Diagnosis not present

## 2021-11-28 MED ORDER — CLOPIDOGREL BISULFATE 75 MG PO TABS: 75.0000 mg | ORAL_TABLET | Freq: Every day | ORAL | 1 refills | Status: DC

## 2021-12-11 ENCOUNTER — Encounter: Payer: Self-pay | Admitting: Family Medicine

## 2021-12-11 ENCOUNTER — Ambulatory Visit (INDEPENDENT_AMBULATORY_CARE_PROVIDER_SITE_OTHER): Payer: Medicare Other | Admitting: Family Medicine

## 2021-12-11 VITALS — BP 100/52 | HR 89 | Temp 97.4°F | Resp 15 | Ht 59.0 in | Wt 117.0 lb

## 2021-12-11 DIAGNOSIS — R519 Headache, unspecified: Secondary | ICD-10-CM

## 2021-12-11 DIAGNOSIS — G8929 Other chronic pain: Secondary | ICD-10-CM | POA: Diagnosis not present

## 2021-12-11 DIAGNOSIS — R7303 Prediabetes: Secondary | ICD-10-CM

## 2021-12-11 DIAGNOSIS — E782 Mixed hyperlipidemia: Secondary | ICD-10-CM | POA: Diagnosis not present

## 2021-12-11 DIAGNOSIS — R413 Other amnesia: Secondary | ICD-10-CM | POA: Diagnosis not present

## 2021-12-11 DIAGNOSIS — R4182 Altered mental status, unspecified: Secondary | ICD-10-CM | POA: Diagnosis not present

## 2021-12-11 DIAGNOSIS — R590 Localized enlarged lymph nodes: Secondary | ICD-10-CM

## 2021-12-11 MED ORDER — FUROSEMIDE 40 MG PO TABS: 80.0000 mg | ORAL_TABLET | Freq: Two times a day (BID) | ORAL | 0 refills | Status: DC

## 2021-12-11 MED ORDER — CITALOPRAM HYDROBROMIDE 20 MG PO TABS: 20.0000 mg | ORAL_TABLET | Freq: Every day | ORAL | 3 refills | Status: DC

## 2021-12-11 NOTE — Progress Notes (Unsigned)
 Acute Office Visit  Subjective:    Patient ID: Donna Little, female    DOB: 10/17/1945, 76 y.o.   MRN: [id number]  Chief Complaint  Patient presents with   Mass    Right sided of the throat    HPI: Patient is in today for lump on right side of the throat. She noticed 3 weeks ago, soft, no sore to touch. Patient denied fever, chills, sore throat.  She also mentioned productive cough with Denny-yellowish sputum, wheezing since 3 months ago.   Past Medical History:  Diagnosis Date   A-fib (HCC)    Atherosclerosis of both carotid arteries 2022   noted per CT on 05/14/20   Chronic pain syndrome    Chronic systolic heart failure (HCC)    Depression    Emphysema of lung (HCC) 2022   noted per CT scan on 05/14/20   Generalized atherosclerosis    GERD (gastroesophageal reflux disease)    Hypertension    IBS (irritable bowel syndrome)    Melanoma (HCC)    Other specified nontoxic goiter    Palpitations    Polymyalgia rheumatica (HCC)    Primary pulmonary hypertension (HCC)    Pulmonary embolism (HCC)    Stroke (HCC)    ministroke   Supraventricular tachycardia (HCC)    Type 2 diabetes mellitus (HCC)     Past Surgical History:  Procedure Laterality Date   ABDOMINAL HYSTERECTOMY     APPENDECTOMY     CATARACT EXTRACTION     CORONARY ANGIOPLASTY WITH STENT PLACEMENT     KYPHOPLASTY  05/2020   L4-L5    Family History  Problem Relation Age of Onset   Heart disease Mother    Diabetes Mother    Hyperlipidemia Mother    Hypertension Mother    Stroke Mother    Heart attack Mother    Heart disease Brother    Heart attack Brother    Stroke Brother    Hypertension Brother    Hyperlipidemia Brother     Social History   Socioeconomic History   Marital status: Single    Spouse name: Not on file   Number of children: 2   Years of education: Not on file   Highest education level: Not on file  Occupational History   Occupation: retired  Tobacco Use   Smoking  status: Light Smoker    Packs/day: 0.20    Years: 50.00    Total pack years: 10.00    Types: Cigarettes   Smokeless tobacco: Never   Tobacco comments:    smokes 1-2 cigarettes per week.  Vaping Use   Vaping Use: Never used  Substance and Sexual Activity   Alcohol use: No    Alcohol/week: 0.0 standard drinks of alcohol   Drug use: No   Sexual activity: Not Currently  Other Topics Concern   Not on file  Social History Narrative   Not on file   Social Determinants of Health   Financial Resource Strain: Low Risk  (09/29/2021)   Overall Financial Resource Strain (CARDIA)    Difficulty of Paying Living Expenses: Not hard at all  Food Insecurity: No Food Insecurity (09/29/2019)   Hunger Vital Sign    Worried About Running Out of Food in the Last Year: Never true    Ran Out of Food in the Last Year: Never true  Transportation Needs: No Transportation Needs (09/29/2021)   PRAPARE - Transportation    Lack of Transportation (Medical): No    Lack   of Transportation (Non-Medical): No  Physical Activity: Not on file  Stress: Not on file  Social Connections: Not on file  Intimate Partner Violence: Not on file    Outpatient Medications Prior to Visit  Medication Sig Dispense Refill   acetaminophen (TYLENOL) 325 MG tablet Take by mouth.     albuterol (VENTOLIN HFA) 108 (90 Base) MCG/ACT inhaler Inhale 2 puffs into the lungs every 6 (six) hours as needed.      atorvastatin (LIPITOR) 40 MG tablet Take 1 tablet by mouth daily.     citalopram (CELEXA) 20 MG tablet Take 1 tablet (20 mg total) by mouth daily. 90 tablet 3   clopidogrel (PLAVIX) 75 MG tablet Take 1 tablet (75 mg total) by mouth daily. 90 tablet 1   diclofenac Sodium (VOLTAREN) 1 % GEL Apply 2 g topically 4 (four) times daily as needed.      dicyclomine (BENTYL) 20 MG tablet Take 1 tablet (20 mg total) by mouth 4 (four) times daily -  before meals and at bedtime. 360 tablet 1   estradiol (ESTRACE) 0.1 MG/GM vaginal cream Place  vaginally.     furosemide (LASIX) 40 MG tablet Take 2 tablets by mouth twice daily 120 tablet 0   gabapentin (NEURONTIN) 800 MG tablet Take 1 tablet (800 mg total) by mouth 3 (three) times daily. 270 tablet 1   KLOR-CON M20 20 MEQ tablet TAKE 1 TABLET BY MOUTH TWICE DAILY WITH A MEAL 180 tablet 0   lidocaine (LIDODERM) 5 % Place 3 patches onto the skin daily. Remove & Discard patch within 12 hours or as directed by MD 90 patch 3   meclizine (ANTIVERT) 25 MG tablet Take 25 mg by mouth 3 (three) times daily as needed.      morphine (MS CONTIN) 30 MG 12 hr tablet Take 1 tablet (30 mg total) by mouth in the morning, at noon, and at bedtime. 90 tablet 0   naloxone (NARCAN) nasal spray 4 mg/0.1 mL 1 spray (4 mg) intranasally into 1 nostril. Use a new Narcan(R) nasal spray for subsequent doses and administer into alternating nostrils. May repeat every 2 to 3 minutes 2 each 1   nitroGLYCERIN (NITROSTAT) 0.4 MG SL tablet Place under the tongue.     ondansetron (ZOFRAN-ODT) 4 MG disintegrating tablet DISSOLVE 1 TO 2 TABLETS IN MOUTH EVERY 8 HOURS AS NEEDED FOR NAUSEA 40 tablet 0   pantoprazole (PROTONIX) 40 MG tablet Take 1 tablet (40 mg total) by mouth daily. 90 tablet 1   Potassium Chloride ER 20 MEQ TBCR Take 1 tablet by mouth daily. 90 tablet 1   pramipexole (MIRAPEX) 0.25 MG tablet Take 1 tablet (0.25 mg total) by mouth at bedtime as needed. 1 tablet 0   tamsulosin (FLOMAX) 0.4 MG CAPS capsule Take by mouth.     No facility-administered medications prior to visit.    Allergies  Allergen Reactions   Codeine    Macrobid [Nitrofurantoin] Nausea And Vomiting   Methocarbamol    Sertraline     Insomnia    Tizanidine     Review of Systems  Constitutional:  Negative for chills, fatigue and fever.  HENT:  Negative for congestion, ear pain and sore throat.        Lump on throat.  Respiratory:  Positive for cough, chest tightness and wheezing. Negative for shortness of breath.   Cardiovascular:   Negative for chest pain and palpitations.  Gastrointestinal:  Negative for abdominal pain, constipation, diarrhea, nausea and vomiting.    Endocrine: Negative for polydipsia, polyphagia and polyuria.  Genitourinary:  Negative for difficulty urinating and dysuria.  Musculoskeletal:  Negative for arthralgias, back pain and myalgias.  Skin:  Negative for rash.  Neurological:  Negative for headaches.  Psychiatric/Behavioral:  Negative for dysphoric mood. The patient is not nervous/anxious.        Objective:    Physical Exam  Pulse 89   Resp 15   Ht 4' 11" (1.499 m)   Wt 117 lb (53.1 kg)   SpO2 94%   BMI 23.63 kg/m  Wt Readings from Last 3 Encounters:  12/11/21 117 lb (53.1 kg)  06/11/21 117 lb (53.1 kg)  05/05/21 116 lb (52.6 kg)    Health Maintenance Due  Topic Date Due   Hepatitis C Screening  Never done   HEMOGLOBIN A1C  12/28/2020   OPHTHALMOLOGY EXAM  01/14/2021   FOOT EXAM  01/15/2021   URINE MICROALBUMIN  01/19/2021   INFLUENZA VACCINE  11/04/2021    There are no preventive care reminders to display for this patient.   Lab Results  Component Value Date   TSH 0.675 03/05/2021   Lab Results  Component Value Date   WBC 6.5 06/11/2021   HGB 14.9 06/11/2021   HCT 43.9 06/11/2021   MCV 94 06/11/2021   PLT 193 06/11/2021   Lab Results  Component Value Date   NA 140 06/25/2021   K 4.9 06/25/2021   CO2 28 06/25/2021   GLUCOSE 86 06/25/2021   BUN 19 06/25/2021   CREATININE 0.91 06/25/2021   BILITOT 0.4 06/25/2021   ALKPHOS 86 06/25/2021   AST 24 06/25/2021   ALT 16 06/25/2021   PROT 7.2 06/25/2021   ALBUMIN 4.4 06/25/2021   CALCIUM 9.4 06/25/2021   EGFR 65 06/25/2021   Lab Results  Component Value Date   CHOL 117 06/11/2021   Lab Results  Component Value Date   HDL 42 06/11/2021   Lab Results  Component Value Date   LDLCALC 62 06/11/2021   Lab Results  Component Value Date   TRIG 60 06/11/2021   Lab Results  Component Value Date   CHOLHDL  2.8 06/11/2021   Lab Results  Component Value Date   HGBA1C 5.5 06/27/2020       Assessment & Plan:   Problem List Items Addressed This Visit   None  No orders of the defined types were placed in this encounter.   No orders of the defined types were placed in this encounter.    Follow-up: No follow-ups on file.  An After Visit Summary was printed and given to the patient.  [name], MD [facility] [phone]

## 2021-12-12 ENCOUNTER — Telehealth: Payer: Self-pay

## 2021-12-12 NOTE — Chronic Care Management (AMB) (Signed)
Chronic Care Management Pharmacy Assistant   Name: Donna Little  MRN: [id number] DOB: [birth year]  Reason for Encounter: Disease State/ General  Recent office visits:  12-11-2021 [name], MD. Labs pending.  Recent consult visits:  None  Hospital visits:  None in previous 6 months  Medications: Outpatient Encounter Medications as of 12/12/2021  Medication Sig   acetaminophen (TYLENOL) 325 MG tablet Take by mouth.   albuterol (VENTOLIN HFA) 108 (90 Base) MCG/ACT inhaler Inhale 2 puffs into the lungs every 6 (six) hours as needed.    atorvastatin (LIPITOR) 40 MG tablet Take 1 tablet by mouth daily.   citalopram (CELEXA) 20 MG tablet Take 1 tablet (20 mg total) by mouth daily.   clopidogrel (PLAVIX) 75 MG tablet Take 1 tablet (75 mg total) by mouth daily.   diclofenac Sodium (VOLTAREN) 1 % GEL Apply 2 g topically 4 (four) times daily as needed.    dicyclomine (BENTYL) 20 MG tablet Take 1 tablet (20 mg total) by mouth 4 (four) times daily -  before meals and at bedtime.   estradiol (ESTRACE) 0.1 MG/GM vaginal cream Place vaginally.   furosemide (LASIX) 40 MG tablet Take 2 tablets (80 mg total) by mouth 2 (two) times daily.   gabapentin (NEURONTIN) 800 MG tablet Take 1 tablet (800 mg total) by mouth 3 (three) times daily.   KLOR-CON M20 20 MEQ tablet TAKE 1 TABLET BY MOUTH TWICE DAILY WITH A MEAL   lidocaine (LIDODERM) 5 % Place 3 patches onto the skin daily. Remove & Discard patch within 12 hours or as directed by MD   meclizine (ANTIVERT) 25 MG tablet Take 25 mg by mouth 3 (three) times daily as needed.    morphine (MS CONTIN) 30 MG 12 hr tablet Take 1 tablet (30 mg total) by mouth in the morning, at noon, and at bedtime.   naloxone (NARCAN) nasal spray 4 mg/0.1 mL 1 spray (4 mg) intranasally into 1 nostril. Use a new Narcan(R) nasal spray for subsequent doses and administer into alternating nostrils. May repeat every 2 to 3 minutes   nitroGLYCERIN (NITROSTAT) 0.4 MG SL tablet  Place under the tongue.   ondansetron (ZOFRAN-ODT) 4 MG disintegrating tablet DISSOLVE 1 TO 2 TABLETS IN MOUTH EVERY 8 HOURS AS NEEDED FOR NAUSEA   pantoprazole (PROTONIX) 40 MG tablet Take 1 tablet (40 mg total) by mouth daily.   Potassium Chloride ER 20 MEQ TBCR Take 1 tablet by mouth daily.   pramipexole (MIRAPEX) 0.25 MG tablet Take 1 tablet (0.25 mg total) by mouth at bedtime as needed.   tamsulosin (FLOMAX) 0.4 MG CAPS capsule Take by mouth.   No facility-administered encounter medications on file as of 12/12/2021.  Contacted [name] for General Review Call   Chart Review:  Have there been any documented new, changed, or discontinued medications since last visit? No  Has there been any documented recent hospitalizations or ED visits since last visit with Clinical Pharmacist? No Brief Summary (including medication and/or Diagnosis changes): None   Adherence Review:  Does the Clinical Pharmacist Assistant have access to adherence rates? Yes Adherence rates for STAR metric medications (Please review star rating below). Adherence rates for medications indicated for disease state being reviewed (List medication(s)/day supply/ last 2 fill dates). Does the patient have >5 day gap between last estimated fill dates for any of the above medications or other medication gaps? No Reason for medication gaps: None   Disease State Questions:  Able to connect with  Patient? Yes  Did patient have any problems with their health recently? Yes Explain: Patient stated she saw Dr. Tobie Poet yesterday due to lump on neck swelling. Imaging was ordered.  Have you had any admissions or emergency room visits or worsening of your condition(s) since last visit? No  Have you had any visits with new specialists or providers since your last visit? No  Have you had any new health care problem(s) since your last visit? No  Have you run out of any of your medications since you last spoke with clinical  pharmacist? No  Are there any medications you are not taking as prescribed? No  Are you having any issues or side effects with your medications? No  Do you have any other health concerns or questions you want to discuss with your Clinical Pharmacist before your next visit? No  Are there any health concerns that you feel we can do a better job addressing? No  Are you having any problems with any of the following since the last visit: (select all that apply)  None  12. Any falls since last visit? No   13. Any increased or uncontrolled pain since last visit? No  14. Next visit Type: telephone       Visit with: Arizona Constable        Date: 03-10-2022        Time: 1:00  15. Additional Details? No    Care Gaps: Hep C screening overdue Yearly ophthalmology overdue Yearly foot exam overdue A1C overdue Last annual wellness visit? 04-09-2021  Star Rating Drugs: Atorvastatin 40 mg- Last filled 11-04-2021 90 DS [facility]. Previous filled 08-04-2021 90 DS  [place] Clinical Pharmacist Assistant [phone]

## 2021-12-14 ENCOUNTER — Ambulatory Visit
Admission: RE | Admit: 2021-12-14 | Discharge: 2021-12-14 | Disposition: A | Discharge status: Home / Self Care | Payer: Self-pay | Source: Ambulatory Visit | Attending: Family Medicine | Admitting: Family Medicine

## 2021-12-14 ENCOUNTER — Encounter: Payer: Self-pay | Admitting: Family Medicine

## 2021-12-14 DIAGNOSIS — R590 Localized enlarged lymph nodes: Secondary | ICD-10-CM

## 2021-12-14 NOTE — Assessment & Plan Note (Signed)
Check ct of brain.  Check labs.

## 2021-12-14 NOTE — Assessment & Plan Note (Signed)
Check ct of brain.

## 2021-12-14 NOTE — Assessment & Plan Note (Signed)
Check a1c.  Recommend continue to work on eating healthy diet and exercise.

## 2021-12-14 NOTE — Assessment & Plan Note (Addendum)
Well controlled.  No changes to medicines. Continue lipitor 40 mg before bed.  Continue to work on eating a healthy diet and exercise.  Labs drawn today.   

## 2021-12-14 NOTE — Assessment & Plan Note (Signed)
>>  ASSESSMENT AND PLAN FOR PREDIABETES WRITTEN ON 12/14/2021  4:39 PM BY COX, KIRSTEN, MD  Check a1c.  Recommend continue to work on eating healthy diet and exercise.

## 2021-12-14 NOTE — Assessment & Plan Note (Signed)
Check ct of brain

## 2021-12-14 NOTE — Assessment & Plan Note (Signed)
Check ct of neck

## 2021-12-15 ENCOUNTER — Other Ambulatory Visit: Payer: Self-pay | Admitting: Family Medicine

## 2021-12-15 DIAGNOSIS — M545 Low back pain, unspecified: Secondary | ICD-10-CM | POA: Diagnosis not present

## 2021-12-15 DIAGNOSIS — M546 Pain in thoracic spine: Secondary | ICD-10-CM | POA: Diagnosis not present

## 2021-12-15 DIAGNOSIS — R590 Localized enlarged lymph nodes: Secondary | ICD-10-CM

## 2021-12-15 LAB — COMPREHENSIVE METABOLIC PANEL
ALT: 12 IU/L (ref 0–32)
AST: 22 IU/L (ref 0–40)
Albumin/Globulin Ratio: 1.6 (ref 1.2–2.2)
Albumin: 4.1 g/dL (ref 3.8–4.8)
Alkaline Phosphatase: 107 IU/L (ref 44–121)
BUN/Creatinine Ratio: 20 (ref 12–28)
BUN: 18 mg/dL (ref 8–27)
Bilirubin Total: 0.3 mg/dL (ref 0.0–1.2)
CO2: 30 mmol/L — ABNORMAL HIGH (ref 20–29)
Calcium: 9.2 mg/dL (ref 8.7–10.3)
Chloride: 98 mmol/L (ref 96–106)
Creatinine, Ser: 0.92 mg/dL (ref 0.57–1.00)
Globulin, Total: 2.6 g/dL (ref 1.5–4.5)
Glucose: 65 mg/dL — ABNORMAL LOW (ref 70–99)
Potassium: 4.7 mmol/L (ref 3.5–5.2)
Sodium: 139 mmol/L (ref 134–144)
Total Protein: 6.7 g/dL (ref 6.0–8.5)
eGFR: 64 mL/min/{1.73_m2} (ref >59)

## 2021-12-15 LAB — LIPID PANEL
Chol/HDL Ratio: 2.8 ratio (ref 0.0–4.4)
Cholesterol, Total: 113 mg/dL (ref 100–199)
HDL: 40 mg/dL (ref >39)
LDL Chol Calc (NIH): 57 mg/dL (ref 0–99)
Triglycerides: 78 mg/dL (ref 0–149)
VLDL Cholesterol Cal: 16 mg/dL (ref 5–40)

## 2021-12-15 LAB — CBC
Hematocrit: 45.2 % (ref 34.0–46.6)
Hemoglobin: 15 g/dL (ref 11.1–15.9)
MCH: 32.8 pg (ref 26.6–33.0)
MCHC: 33.2 g/dL (ref 31.5–35.7)
MCV: 99 fL — ABNORMAL HIGH (ref 79–97)
Platelets: 188 10*3/uL (ref 150–450)
RBC: 4.58 x10E6/uL (ref 3.77–5.28)
RDW: 12.5 % (ref 11.7–15.4)
WBC: 6.3 10*3/uL (ref 3.4–10.8)

## 2021-12-15 LAB — HEMOGLOBIN A1C
Est. average glucose Bld gHb Est-mCnc: 126 mg/dL
Hgb A1c MFr Bld: 6 % — ABNORMAL HIGH (ref 4.8–5.6)

## 2021-12-15 LAB — CARDIOVASCULAR RISK ASSESSMENT

## 2021-12-15 LAB — METHYLMALONIC ACID, SERUM: Methylmalonic Acid: 288 nmol/L (ref 0–378)

## 2021-12-15 LAB — TSH: TSH: 0.714 u[IU]/mL (ref 0.450–4.500)

## 2021-12-17 DIAGNOSIS — G8929 Other chronic pain: Secondary | ICD-10-CM | POA: Diagnosis not present

## 2021-12-17 DIAGNOSIS — R4182 Altered mental status, unspecified: Secondary | ICD-10-CM | POA: Diagnosis not present

## 2021-12-17 DIAGNOSIS — R221 Localized swelling, mass and lump, neck: Secondary | ICD-10-CM | POA: Diagnosis not present

## 2021-12-17 DIAGNOSIS — I6523 Occlusion and stenosis of bilateral carotid arteries: Secondary | ICD-10-CM | POA: Diagnosis not present

## 2021-12-17 DIAGNOSIS — R413 Other amnesia: Secondary | ICD-10-CM | POA: Diagnosis not present

## 2021-12-17 DIAGNOSIS — R519 Headache, unspecified: Secondary | ICD-10-CM | POA: Diagnosis not present

## 2021-12-22 ENCOUNTER — Other Ambulatory Visit: Payer: Self-pay

## 2021-12-22 MED ORDER — PANTOPRAZOLE SODIUM 40 MG PO TBEC: 40.0000 mg | DELAYED_RELEASE_TABLET | Freq: Every day | ORAL | 1 refills | Status: DC

## 2021-12-22 MED ORDER — MORPHINE SULFATE ER 30 MG PO TBCR: 30.0000 mg | EXTENDED_RELEASE_TABLET | Freq: Three times a day (TID) | ORAL | 0 refills | Status: DC

## 2021-12-24 DIAGNOSIS — M546 Pain in thoracic spine: Secondary | ICD-10-CM | POA: Diagnosis not present

## 2021-12-24 DIAGNOSIS — M545 Low back pain, unspecified: Secondary | ICD-10-CM | POA: Diagnosis not present

## 2021-12-30 ENCOUNTER — Ambulatory Visit (INDEPENDENT_AMBULATORY_CARE_PROVIDER_SITE_OTHER): Payer: Medicare Other

## 2021-12-30 DIAGNOSIS — Z23 Encounter for immunization: Secondary | ICD-10-CM

## 2021-12-31 DIAGNOSIS — M546 Pain in thoracic spine: Secondary | ICD-10-CM | POA: Diagnosis not present

## 2021-12-31 DIAGNOSIS — M545 Low back pain, unspecified: Secondary | ICD-10-CM | POA: Diagnosis not present

## 2022-01-06 ENCOUNTER — Other Ambulatory Visit: Payer: Self-pay

## 2022-01-06 DIAGNOSIS — R110 Nausea: Secondary | ICD-10-CM

## 2022-01-06 MED ORDER — ONDANSETRON 4 MG PO TBDP: ORAL_TABLET | ORAL | 0 refills | Status: DC

## 2022-01-07 DIAGNOSIS — M545 Low back pain, unspecified: Secondary | ICD-10-CM | POA: Diagnosis not present

## 2022-01-07 DIAGNOSIS — M546 Pain in thoracic spine: Secondary | ICD-10-CM | POA: Diagnosis not present

## 2022-01-09 DIAGNOSIS — R410 Disorientation, unspecified: Secondary | ICD-10-CM | POA: Diagnosis not present

## 2022-01-09 DIAGNOSIS — R112 Nausea with vomiting, unspecified: Secondary | ICD-10-CM | POA: Diagnosis not present

## 2022-01-09 DIAGNOSIS — K769 Liver disease, unspecified: Secondary | ICD-10-CM | POA: Diagnosis not present

## 2022-01-09 DIAGNOSIS — R39198 Other difficulties with micturition: Secondary | ICD-10-CM | POA: Diagnosis not present

## 2022-01-09 DIAGNOSIS — R9431 Abnormal electrocardiogram [ECG] [EKG]: Secondary | ICD-10-CM | POA: Diagnosis not present

## 2022-01-09 DIAGNOSIS — R059 Cough, unspecified: Secondary | ICD-10-CM | POA: Diagnosis not present

## 2022-01-09 DIAGNOSIS — N390 Urinary tract infection, site not specified: Secondary | ICD-10-CM | POA: Diagnosis not present

## 2022-01-09 DIAGNOSIS — R109 Unspecified abdominal pain: Secondary | ICD-10-CM | POA: Diagnosis not present

## 2022-01-12 ENCOUNTER — Telehealth: Payer: Self-pay

## 2022-01-12 DIAGNOSIS — R4182 Altered mental status, unspecified: Secondary | ICD-10-CM | POA: Diagnosis not present

## 2022-01-12 DIAGNOSIS — J811 Chronic pulmonary edema: Secondary | ICD-10-CM | POA: Diagnosis not present

## 2022-01-12 DIAGNOSIS — R0902 Hypoxemia: Secondary | ICD-10-CM | POA: Diagnosis not present

## 2022-01-12 NOTE — Chronic Care Management (AMB) (Signed)
    Chronic Care Management Pharmacy Assistant   Name: Donna Little  MRN: [id number] DOB: 1946-02-14  Reason for Encounter: Disease State/ General  Recent office visits:  01-02-2022 Effie Shy, RN. Flu vaccine given  Recent consult visits:  None  Hospital visits:  None in previous 6 months  Medications: Outpatient Encounter Medications as of 01/12/2022  Medication Sig   acetaminophen (TYLENOL) 325 MG tablet Take by mouth.   albuterol (VENTOLIN HFA) 108 (90 Base) MCG/ACT inhaler Inhale 2 puffs into the lungs every 6 (six) hours as needed.    atorvastatin (LIPITOR) 40 MG tablet Take 1 tablet by mouth daily.   citalopram (CELEXA) 20 MG tablet Take 1 tablet (20 mg total) by mouth daily.   clopidogrel (PLAVIX) 75 MG tablet Take 1 tablet (75 mg total) by mouth daily.   diclofenac Sodium (VOLTAREN) 1 % GEL Apply 2 g topically 4 (four) times daily as needed.    dicyclomine (BENTYL) 20 MG tablet Take 1 tablet (20 mg total) by mouth 4 (four) times daily -  before meals and at bedtime.   estradiol (ESTRACE) 0.1 MG/GM vaginal cream Place vaginally.   furosemide (LASIX) 40 MG tablet Take 2 tablets (80 mg total) by mouth 2 (two) times daily.   gabapentin (NEURONTIN) 800 MG tablet Take 1 tablet (800 mg total) by mouth 3 (three) times daily.   KLOR-CON M20 20 MEQ tablet TAKE 1 TABLET BY MOUTH TWICE DAILY WITH A MEAL   lidocaine (LIDODERM) 5 % Place 3 patches onto the skin daily. Remove & Discard patch within 12 hours or as directed by MD   meclizine (ANTIVERT) 25 MG tablet Take 25 mg by mouth 3 (three) times daily as needed.    morphine (MS CONTIN) 30 MG 12 hr tablet Take 1 tablet (30 mg total) by mouth in the morning, at noon, and at bedtime.   naloxone (NARCAN) nasal spray 4 mg/0.1 mL 1 spray (4 mg) intranasally into 1 nostril. Use a new Narcan(R) nasal spray for subsequent doses and administer into alternating nostrils. May repeat every 2 to 3 minutes   nitroGLYCERIN (NITROSTAT) 0.4 MG  SL tablet Place under the tongue.   ondansetron (ZOFRAN-ODT) 4 MG disintegrating tablet DISSOLVE 1 TO 2 TABLETS IN MOUTH EVERY 8 HOURS AS NEEDED FOR NAUSEA   pantoprazole (PROTONIX) 40 MG tablet Take 1 tablet (40 mg total) by mouth daily.   Potassium Chloride ER 20 MEQ TBCR Take 1 tablet by mouth daily.   pramipexole (MIRAPEX) 0.25 MG tablet Take 1 tablet (0.25 mg total) by mouth at bedtime as needed.   tamsulosin (FLOMAX) 0.4 MG CAPS capsule Take by mouth.   No facility-administered encounter medications on file as of 01/12/2022.   01-12-2022: 1st attempt left VM 01-15-2022: 2nd attempt left VM 01-20-2022: 3rd attempt left VM  Care Gaps: Hep C screening overdue Yearly ophthalmology overdue Yearly foot exam overdue A1C overdue Last annual wellness visit? 04-09-2021  Star Rating Drugs: Atorvastatin 40 mg- Last filled 11-04-2021 90 DS [facility]. Previous filled 08-04-2021 90 DS  [place] Clinical Pharmacist Assistant [phone]

## 2022-01-14 DIAGNOSIS — E43 Unspecified severe protein-calorie malnutrition: Secondary | ICD-10-CM | POA: Diagnosis not present

## 2022-01-14 DIAGNOSIS — E876 Hypokalemia: Secondary | ICD-10-CM | POA: Diagnosis not present

## 2022-01-14 DIAGNOSIS — R4182 Altered mental status, unspecified: Secondary | ICD-10-CM | POA: Diagnosis not present

## 2022-01-14 DIAGNOSIS — I6783 Posterior reversible encephalopathy syndrome: Secondary | ICD-10-CM | POA: Diagnosis not present

## 2022-01-14 DIAGNOSIS — R569 Unspecified convulsions: Secondary | ICD-10-CM | POA: Diagnosis not present

## 2022-01-14 DIAGNOSIS — Z9911 Dependence on respirator [ventilator] status: Secondary | ICD-10-CM | POA: Diagnosis not present

## 2022-01-14 DIAGNOSIS — M19011 Primary osteoarthritis, right shoulder: Secondary | ICD-10-CM | POA: Diagnosis not present

## 2022-01-14 DIAGNOSIS — I493 Ventricular premature depolarization: Secondary | ICD-10-CM | POA: Diagnosis not present

## 2022-01-14 DIAGNOSIS — R0603 Acute respiratory distress: Secondary | ICD-10-CM | POA: Diagnosis not present

## 2022-01-14 DIAGNOSIS — I160 Hypertensive urgency: Secondary | ICD-10-CM | POA: Diagnosis not present

## 2022-01-14 DIAGNOSIS — Z4682 Encounter for fitting and adjustment of non-vascular catheter: Secondary | ICD-10-CM | POA: Diagnosis not present

## 2022-01-14 DIAGNOSIS — G40901 Epilepsy, unspecified, not intractable, with status epilepticus: Secondary | ICD-10-CM | POA: Diagnosis not present

## 2022-01-14 DIAGNOSIS — J189 Pneumonia, unspecified organism: Secondary | ICD-10-CM | POA: Diagnosis not present

## 2022-01-14 DIAGNOSIS — R451 Restlessness and agitation: Secondary | ICD-10-CM | POA: Diagnosis not present

## 2022-01-14 DIAGNOSIS — E785 Hyperlipidemia, unspecified: Secondary | ICD-10-CM | POA: Diagnosis not present

## 2022-01-14 DIAGNOSIS — D72829 Elevated white blood cell count, unspecified: Secondary | ICD-10-CM | POA: Diagnosis not present

## 2022-01-14 DIAGNOSIS — M79651 Pain in right thigh: Secondary | ICD-10-CM | POA: Diagnosis not present

## 2022-01-14 DIAGNOSIS — F32A Depression, unspecified: Secondary | ICD-10-CM | POA: Diagnosis not present

## 2022-01-14 DIAGNOSIS — G8929 Other chronic pain: Secondary | ICD-10-CM | POA: Diagnosis not present

## 2022-01-14 DIAGNOSIS — R1310 Dysphagia, unspecified: Secondary | ICD-10-CM | POA: Diagnosis not present

## 2022-01-14 DIAGNOSIS — M549 Dorsalgia, unspecified: Secondary | ICD-10-CM | POA: Diagnosis not present

## 2022-01-14 DIAGNOSIS — M79652 Pain in left thigh: Secondary | ICD-10-CM | POA: Diagnosis not present

## 2022-01-14 DIAGNOSIS — R102 Pelvic and perineal pain: Secondary | ICD-10-CM | POA: Diagnosis not present

## 2022-01-14 DIAGNOSIS — G4089 Other seizures: Secondary | ICD-10-CM | POA: Diagnosis not present

## 2022-01-14 DIAGNOSIS — I6522 Occlusion and stenosis of left carotid artery: Secondary | ICD-10-CM | POA: Diagnosis not present

## 2022-01-14 DIAGNOSIS — E871 Hypo-osmolality and hyponatremia: Secondary | ICD-10-CM | POA: Diagnosis not present

## 2022-01-14 DIAGNOSIS — J9600 Acute respiratory failure, unspecified whether with hypoxia or hypercapnia: Secondary | ICD-10-CM | POA: Diagnosis not present

## 2022-01-14 DIAGNOSIS — R9089 Other abnormal findings on diagnostic imaging of central nervous system: Secondary | ICD-10-CM | POA: Diagnosis not present

## 2022-01-14 DIAGNOSIS — R410 Disorientation, unspecified: Secondary | ICD-10-CM | POA: Diagnosis not present

## 2022-01-14 DIAGNOSIS — J9601 Acute respiratory failure with hypoxia: Secondary | ICD-10-CM | POA: Diagnosis not present

## 2022-01-14 DIAGNOSIS — R2689 Other abnormalities of gait and mobility: Secondary | ICD-10-CM | POA: Diagnosis not present

## 2022-01-14 DIAGNOSIS — F17210 Nicotine dependence, cigarettes, uncomplicated: Secondary | ICD-10-CM | POA: Diagnosis not present

## 2022-01-14 DIAGNOSIS — N3001 Acute cystitis with hematuria: Secondary | ICD-10-CM | POA: Diagnosis not present

## 2022-01-14 DIAGNOSIS — G9340 Encephalopathy, unspecified: Secondary | ICD-10-CM | POA: Diagnosis not present

## 2022-01-14 DIAGNOSIS — R918 Other nonspecific abnormal finding of lung field: Secondary | ICD-10-CM | POA: Diagnosis not present

## 2022-01-14 DIAGNOSIS — F329 Major depressive disorder, single episode, unspecified: Secondary | ICD-10-CM | POA: Diagnosis not present

## 2022-01-14 DIAGNOSIS — K5989 Other specified functional intestinal disorders: Secondary | ICD-10-CM | POA: Diagnosis not present

## 2022-01-14 DIAGNOSIS — J9811 Atelectasis: Secondary | ICD-10-CM | POA: Diagnosis not present

## 2022-01-14 DIAGNOSIS — M6281 Muscle weakness (generalized): Secondary | ICD-10-CM | POA: Diagnosis not present

## 2022-01-14 DIAGNOSIS — J984 Other disorders of lung: Secondary | ICD-10-CM | POA: Diagnosis not present

## 2022-01-14 DIAGNOSIS — I2510 Atherosclerotic heart disease of native coronary artery without angina pectoris: Secondary | ICD-10-CM | POA: Diagnosis not present

## 2022-01-14 DIAGNOSIS — I471 Supraventricular tachycardia, unspecified: Secondary | ICD-10-CM | POA: Diagnosis not present

## 2022-01-14 DIAGNOSIS — G9349 Other encephalopathy: Secondary | ICD-10-CM | POA: Diagnosis not present

## 2022-01-14 DIAGNOSIS — G40909 Epilepsy, unspecified, not intractable, without status epilepticus: Secondary | ICD-10-CM | POA: Diagnosis not present

## 2022-01-14 DIAGNOSIS — I252 Old myocardial infarction: Secondary | ICD-10-CM | POA: Diagnosis not present

## 2022-01-14 DIAGNOSIS — K6389 Other specified diseases of intestine: Secondary | ICD-10-CM | POA: Diagnosis not present

## 2022-01-14 DIAGNOSIS — I700 Atherosclerosis of aorta: Secondary | ICD-10-CM | POA: Diagnosis not present

## 2022-01-14 DIAGNOSIS — R930 Abnormal findings on diagnostic imaging of skull and head, not elsewhere classified: Secondary | ICD-10-CM | POA: Diagnosis not present

## 2022-01-14 DIAGNOSIS — I6381 Other cerebral infarction due to occlusion or stenosis of small artery: Secondary | ICD-10-CM | POA: Diagnosis not present

## 2022-01-14 DIAGNOSIS — Z7409 Other reduced mobility: Secondary | ICD-10-CM | POA: Diagnosis not present

## 2022-01-14 DIAGNOSIS — I10 Essential (primary) hypertension: Secondary | ICD-10-CM | POA: Diagnosis not present

## 2022-01-15 DIAGNOSIS — J9600 Acute respiratory failure, unspecified whether with hypoxia or hypercapnia: Secondary | ICD-10-CM | POA: Diagnosis not present

## 2022-01-15 DIAGNOSIS — N3001 Acute cystitis with hematuria: Secondary | ICD-10-CM | POA: Diagnosis not present

## 2022-01-15 DIAGNOSIS — Z7409 Other reduced mobility: Secondary | ICD-10-CM | POA: Diagnosis not present

## 2022-01-15 DIAGNOSIS — G9340 Encephalopathy, unspecified: Secondary | ICD-10-CM | POA: Diagnosis not present

## 2022-01-15 DIAGNOSIS — I160 Hypertensive urgency: Secondary | ICD-10-CM | POA: Diagnosis not present

## 2022-01-15 DIAGNOSIS — R1310 Dysphagia, unspecified: Secondary | ICD-10-CM | POA: Diagnosis not present

## 2022-01-15 DIAGNOSIS — D72829 Elevated white blood cell count, unspecified: Secondary | ICD-10-CM | POA: Diagnosis not present

## 2022-01-15 DIAGNOSIS — G40901 Epilepsy, unspecified, not intractable, with status epilepticus: Secondary | ICD-10-CM | POA: Diagnosis not present

## 2022-01-16 DIAGNOSIS — D72829 Elevated white blood cell count, unspecified: Secondary | ICD-10-CM | POA: Diagnosis not present

## 2022-01-16 DIAGNOSIS — R1310 Dysphagia, unspecified: Secondary | ICD-10-CM | POA: Diagnosis not present

## 2022-01-16 DIAGNOSIS — G9340 Encephalopathy, unspecified: Secondary | ICD-10-CM | POA: Diagnosis not present

## 2022-01-16 DIAGNOSIS — Z7409 Other reduced mobility: Secondary | ICD-10-CM | POA: Diagnosis not present

## 2022-01-16 DIAGNOSIS — N3001 Acute cystitis with hematuria: Secondary | ICD-10-CM | POA: Diagnosis not present

## 2022-01-16 DIAGNOSIS — G40901 Epilepsy, unspecified, not intractable, with status epilepticus: Secondary | ICD-10-CM | POA: Diagnosis not present

## 2022-01-16 DIAGNOSIS — I160 Hypertensive urgency: Secondary | ICD-10-CM | POA: Diagnosis not present

## 2022-01-16 DIAGNOSIS — J9600 Acute respiratory failure, unspecified whether with hypoxia or hypercapnia: Secondary | ICD-10-CM | POA: Diagnosis not present

## 2022-01-17 DIAGNOSIS — G40901 Epilepsy, unspecified, not intractable, with status epilepticus: Secondary | ICD-10-CM | POA: Diagnosis not present

## 2022-01-18 DIAGNOSIS — G9340 Encephalopathy, unspecified: Secondary | ICD-10-CM | POA: Diagnosis not present

## 2022-01-18 DIAGNOSIS — R1310 Dysphagia, unspecified: Secondary | ICD-10-CM | POA: Diagnosis not present

## 2022-01-18 DIAGNOSIS — Z7409 Other reduced mobility: Secondary | ICD-10-CM | POA: Diagnosis not present

## 2022-01-18 DIAGNOSIS — G40901 Epilepsy, unspecified, not intractable, with status epilepticus: Secondary | ICD-10-CM | POA: Diagnosis not present

## 2022-01-18 DIAGNOSIS — I160 Hypertensive urgency: Secondary | ICD-10-CM | POA: Diagnosis not present

## 2022-01-18 DIAGNOSIS — N3001 Acute cystitis with hematuria: Secondary | ICD-10-CM | POA: Diagnosis not present

## 2022-01-19 DIAGNOSIS — I471 Supraventricular tachycardia, unspecified: Secondary | ICD-10-CM | POA: Diagnosis not present

## 2022-01-19 DIAGNOSIS — I10 Essential (primary) hypertension: Secondary | ICD-10-CM | POA: Diagnosis not present

## 2022-01-19 DIAGNOSIS — G40901 Epilepsy, unspecified, not intractable, with status epilepticus: Secondary | ICD-10-CM | POA: Diagnosis not present

## 2022-01-19 DIAGNOSIS — I160 Hypertensive urgency: Secondary | ICD-10-CM | POA: Diagnosis not present

## 2022-01-19 DIAGNOSIS — Z7409 Other reduced mobility: Secondary | ICD-10-CM | POA: Diagnosis not present

## 2022-01-19 DIAGNOSIS — I6783 Posterior reversible encephalopathy syndrome: Secondary | ICD-10-CM | POA: Diagnosis not present

## 2022-01-20 DIAGNOSIS — I10 Essential (primary) hypertension: Secondary | ICD-10-CM | POA: Diagnosis not present

## 2022-01-20 DIAGNOSIS — G40901 Epilepsy, unspecified, not intractable, with status epilepticus: Secondary | ICD-10-CM | POA: Diagnosis not present

## 2022-01-20 DIAGNOSIS — I471 Supraventricular tachycardia, unspecified: Secondary | ICD-10-CM | POA: Diagnosis not present

## 2022-01-20 DIAGNOSIS — J9600 Acute respiratory failure, unspecified whether with hypoxia or hypercapnia: Secondary | ICD-10-CM | POA: Diagnosis not present

## 2022-01-20 DIAGNOSIS — Z7409 Other reduced mobility: Secondary | ICD-10-CM | POA: Diagnosis not present

## 2022-01-20 DIAGNOSIS — R1310 Dysphagia, unspecified: Secondary | ICD-10-CM | POA: Diagnosis not present

## 2022-01-20 DIAGNOSIS — I6783 Posterior reversible encephalopathy syndrome: Secondary | ICD-10-CM | POA: Diagnosis not present

## 2022-01-21 DIAGNOSIS — R1310 Dysphagia, unspecified: Secondary | ICD-10-CM | POA: Diagnosis not present

## 2022-01-21 DIAGNOSIS — Z7409 Other reduced mobility: Secondary | ICD-10-CM | POA: Diagnosis not present

## 2022-01-21 DIAGNOSIS — I471 Supraventricular tachycardia, unspecified: Secondary | ICD-10-CM | POA: Diagnosis not present

## 2022-01-21 DIAGNOSIS — I6783 Posterior reversible encephalopathy syndrome: Secondary | ICD-10-CM | POA: Diagnosis not present

## 2022-01-21 DIAGNOSIS — G40901 Epilepsy, unspecified, not intractable, with status epilepticus: Secondary | ICD-10-CM | POA: Diagnosis not present

## 2022-01-21 DIAGNOSIS — J9600 Acute respiratory failure, unspecified whether with hypoxia or hypercapnia: Secondary | ICD-10-CM | POA: Diagnosis not present

## 2022-01-21 DIAGNOSIS — I10 Essential (primary) hypertension: Secondary | ICD-10-CM | POA: Diagnosis not present

## 2022-01-22 ENCOUNTER — Other Ambulatory Visit: Payer: Self-pay

## 2022-01-22 ENCOUNTER — Encounter: Payer: Self-pay | Admitting: Family Medicine

## 2022-01-22 DIAGNOSIS — I471 Supraventricular tachycardia, unspecified: Secondary | ICD-10-CM | POA: Diagnosis not present

## 2022-01-22 DIAGNOSIS — Z7409 Other reduced mobility: Secondary | ICD-10-CM | POA: Diagnosis not present

## 2022-01-22 DIAGNOSIS — R590 Localized enlarged lymph nodes: Secondary | ICD-10-CM

## 2022-01-22 DIAGNOSIS — I6783 Posterior reversible encephalopathy syndrome: Secondary | ICD-10-CM | POA: Diagnosis not present

## 2022-01-22 DIAGNOSIS — G40901 Epilepsy, unspecified, not intractable, with status epilepticus: Secondary | ICD-10-CM | POA: Diagnosis not present

## 2022-01-22 DIAGNOSIS — I10 Essential (primary) hypertension: Secondary | ICD-10-CM | POA: Diagnosis not present

## 2022-01-22 DIAGNOSIS — J9600 Acute respiratory failure, unspecified whether with hypoxia or hypercapnia: Secondary | ICD-10-CM | POA: Diagnosis not present

## 2022-01-22 DIAGNOSIS — R1310 Dysphagia, unspecified: Secondary | ICD-10-CM | POA: Diagnosis not present

## 2022-01-24 DIAGNOSIS — I119 Hypertensive heart disease without heart failure: Secondary | ICD-10-CM | POA: Diagnosis not present

## 2022-01-24 DIAGNOSIS — G4089 Other seizures: Secondary | ICD-10-CM | POA: Diagnosis not present

## 2022-01-24 DIAGNOSIS — I2510 Atherosclerotic heart disease of native coronary artery without angina pectoris: Secondary | ICD-10-CM | POA: Diagnosis not present

## 2022-01-24 DIAGNOSIS — R2689 Other abnormalities of gait and mobility: Secondary | ICD-10-CM | POA: Diagnosis not present

## 2022-01-24 DIAGNOSIS — R262 Difficulty in walking, not elsewhere classified: Secondary | ICD-10-CM | POA: Diagnosis not present

## 2022-01-24 DIAGNOSIS — G40901 Epilepsy, unspecified, not intractable, with status epilepticus: Secondary | ICD-10-CM | POA: Diagnosis not present

## 2022-01-24 DIAGNOSIS — R1310 Dysphagia, unspecified: Secondary | ICD-10-CM | POA: Diagnosis not present

## 2022-01-24 DIAGNOSIS — I471 Supraventricular tachycardia, unspecified: Secondary | ICD-10-CM | POA: Diagnosis not present

## 2022-01-24 DIAGNOSIS — F32A Depression, unspecified: Secondary | ICD-10-CM | POA: Diagnosis not present

## 2022-01-24 DIAGNOSIS — J9811 Atelectasis: Secondary | ICD-10-CM | POA: Diagnosis not present

## 2022-01-24 DIAGNOSIS — G8929 Other chronic pain: Secondary | ICD-10-CM | POA: Diagnosis not present

## 2022-01-24 DIAGNOSIS — E43 Unspecified severe protein-calorie malnutrition: Secondary | ICD-10-CM | POA: Diagnosis not present

## 2022-01-24 DIAGNOSIS — J9601 Acute respiratory failure with hypoxia: Secondary | ICD-10-CM | POA: Diagnosis not present

## 2022-01-24 DIAGNOSIS — Z7409 Other reduced mobility: Secondary | ICD-10-CM | POA: Diagnosis not present

## 2022-01-24 DIAGNOSIS — M6281 Muscle weakness (generalized): Secondary | ICD-10-CM | POA: Diagnosis not present

## 2022-01-24 DIAGNOSIS — I6783 Posterior reversible encephalopathy syndrome: Secondary | ICD-10-CM | POA: Diagnosis not present

## 2022-01-24 DIAGNOSIS — I10 Essential (primary) hypertension: Secondary | ICD-10-CM | POA: Diagnosis not present

## 2022-01-24 DIAGNOSIS — J9600 Acute respiratory failure, unspecified whether with hypoxia or hypercapnia: Secondary | ICD-10-CM | POA: Diagnosis not present

## 2022-01-26 DIAGNOSIS — I119 Hypertensive heart disease without heart failure: Secondary | ICD-10-CM | POA: Diagnosis not present

## 2022-01-26 DIAGNOSIS — G4089 Other seizures: Secondary | ICD-10-CM | POA: Diagnosis not present

## 2022-01-26 DIAGNOSIS — R262 Difficulty in walking, not elsewhere classified: Secondary | ICD-10-CM | POA: Diagnosis not present

## 2022-01-26 DIAGNOSIS — I2510 Atherosclerotic heart disease of native coronary artery without angina pectoris: Secondary | ICD-10-CM | POA: Diagnosis not present

## 2022-01-27 ENCOUNTER — Other Ambulatory Visit: Payer: Self-pay | Admitting: *Deleted

## 2022-01-27 NOTE — Patient Outreach (Signed)
Ms. Lame resides in [facility]. Screening for potential [facility] care coordination services as benefit of insurance plan and PCP.   Spoke with Janett Billow, SNF to make aware writer is following for transition plans and [facility] needs.  Will continue to follow.    Marthenia Rolling, MSN, RN,BSN [place] Acute Care Coordinator [phone] (Direct dial)

## 2022-02-02 DIAGNOSIS — J441 Chronic obstructive pulmonary disease with (acute) exacerbation: Secondary | ICD-10-CM | POA: Diagnosis not present

## 2022-02-02 DIAGNOSIS — G894 Chronic pain syndrome: Secondary | ICD-10-CM | POA: Diagnosis not present

## 2022-02-02 DIAGNOSIS — F419 Anxiety disorder, unspecified: Secondary | ICD-10-CM | POA: Diagnosis not present

## 2022-02-02 DIAGNOSIS — R410 Disorientation, unspecified: Secondary | ICD-10-CM | POA: Diagnosis not present

## 2022-02-02 DIAGNOSIS — F0390 Unspecified dementia without behavioral disturbance: Secondary | ICD-10-CM | POA: Diagnosis not present

## 2022-02-02 DIAGNOSIS — F17210 Nicotine dependence, cigarettes, uncomplicated: Secondary | ICD-10-CM | POA: Diagnosis not present

## 2022-02-02 DIAGNOSIS — E7800 Pure hypercholesterolemia, unspecified: Secondary | ICD-10-CM | POA: Diagnosis not present

## 2022-02-02 DIAGNOSIS — I10 Essential (primary) hypertension: Secondary | ICD-10-CM | POA: Diagnosis not present

## 2022-02-02 DIAGNOSIS — R42 Dizziness and giddiness: Secondary | ICD-10-CM | POA: Diagnosis not present

## 2022-02-02 DIAGNOSIS — K219 Gastro-esophageal reflux disease without esophagitis: Secondary | ICD-10-CM | POA: Diagnosis not present

## 2022-02-02 DIAGNOSIS — R0902 Hypoxemia: Secondary | ICD-10-CM | POA: Diagnosis not present

## 2022-02-02 DIAGNOSIS — Z79891 Long term (current) use of opiate analgesic: Secondary | ICD-10-CM | POA: Diagnosis not present

## 2022-02-02 DIAGNOSIS — I959 Hypotension, unspecified: Secondary | ICD-10-CM | POA: Diagnosis not present

## 2022-02-02 DIAGNOSIS — Z7902 Long term (current) use of antithrombotics/antiplatelets: Secondary | ICD-10-CM | POA: Diagnosis not present

## 2022-02-02 DIAGNOSIS — J9611 Chronic respiratory failure with hypoxia: Secondary | ICD-10-CM | POA: Diagnosis not present

## 2022-02-02 DIAGNOSIS — J440 Chronic obstructive pulmonary disease with acute lower respiratory infection: Secondary | ICD-10-CM | POA: Diagnosis not present

## 2022-02-02 DIAGNOSIS — R4182 Altered mental status, unspecified: Secondary | ICD-10-CM | POA: Diagnosis not present

## 2022-02-02 DIAGNOSIS — J811 Chronic pulmonary edema: Secondary | ICD-10-CM | POA: Diagnosis not present

## 2022-02-02 DIAGNOSIS — J9811 Atelectasis: Secondary | ICD-10-CM | POA: Diagnosis not present

## 2022-02-02 DIAGNOSIS — I951 Orthostatic hypotension: Secondary | ICD-10-CM | POA: Diagnosis not present

## 2022-02-02 DIAGNOSIS — G40909 Epilepsy, unspecified, not intractable, without status epilepticus: Secondary | ICD-10-CM | POA: Diagnosis not present

## 2022-02-02 DIAGNOSIS — M1990 Unspecified osteoarthritis, unspecified site: Secondary | ICD-10-CM | POA: Diagnosis not present

## 2022-02-02 DIAGNOSIS — J189 Pneumonia, unspecified organism: Secondary | ICD-10-CM | POA: Diagnosis not present

## 2022-02-02 DIAGNOSIS — Z79899 Other long term (current) drug therapy: Secondary | ICD-10-CM | POA: Diagnosis not present

## 2022-02-02 DIAGNOSIS — E860 Dehydration: Secondary | ICD-10-CM | POA: Diagnosis not present

## 2022-02-02 DIAGNOSIS — I2510 Atherosclerotic heart disease of native coronary artery without angina pectoris: Secondary | ICD-10-CM | POA: Diagnosis not present

## 2022-02-02 DIAGNOSIS — D649 Anemia, unspecified: Secondary | ICD-10-CM | POA: Diagnosis not present

## 2022-02-02 DIAGNOSIS — J449 Chronic obstructive pulmonary disease, unspecified: Secondary | ICD-10-CM | POA: Diagnosis not present

## 2022-02-02 DIAGNOSIS — I491 Atrial premature depolarization: Secondary | ICD-10-CM | POA: Diagnosis not present

## 2022-02-02 DIAGNOSIS — Z85828 Personal history of other malignant neoplasm of skin: Secondary | ICD-10-CM | POA: Diagnosis not present

## 2022-02-02 DIAGNOSIS — I499 Cardiac arrhythmia, unspecified: Secondary | ICD-10-CM | POA: Diagnosis not present

## 2022-02-02 DIAGNOSIS — Z8673 Personal history of transient ischemic attack (TIA), and cerebral infarction without residual deficits: Secondary | ICD-10-CM | POA: Diagnosis not present

## 2022-02-02 DIAGNOSIS — I6381 Other cerebral infarction due to occlusion or stenosis of small artery: Secondary | ICD-10-CM | POA: Diagnosis not present

## 2022-02-02 DIAGNOSIS — F32A Depression, unspecified: Secondary | ICD-10-CM | POA: Diagnosis not present

## 2022-02-02 DIAGNOSIS — I252 Old myocardial infarction: Secondary | ICD-10-CM | POA: Diagnosis not present

## 2022-02-02 DIAGNOSIS — Z885 Allergy status to narcotic agent status: Secondary | ICD-10-CM | POA: Diagnosis not present

## 2022-02-02 DIAGNOSIS — Z955 Presence of coronary angioplasty implant and graft: Secondary | ICD-10-CM | POA: Diagnosis not present

## 2022-02-02 DIAGNOSIS — K589 Irritable bowel syndrome without diarrhea: Secondary | ICD-10-CM | POA: Diagnosis not present

## 2022-02-03 ENCOUNTER — Other Ambulatory Visit: Payer: Self-pay | Admitting: *Deleted

## 2022-02-03 DIAGNOSIS — I10 Essential (primary) hypertension: Secondary | ICD-10-CM

## 2022-02-03 NOTE — Patient Outreach (Signed)
[facility] Post- Acute Care Coordinator follow up.   Spoke with Hessie Diener [facility] social worker. Janett Billow reports Ms. Carrero discharged from [facility] on Friday, Oct. 27th, per Ms. Owens Shark request. She will have [facility].   Will refer to [facility] care coordination team.   Marthenia Rolling, MSN, RN,BSN [place] Acute Care Coordinator [phone] (Direct dial)

## 2022-02-04 ENCOUNTER — Telehealth: Payer: Self-pay | Admitting: *Deleted

## 2022-02-04 NOTE — Chronic Care Management (AMB) (Signed)
  Care Coordination  Outreach Note  02/04/2022 Name: Donna Little MRN: [id number] DOB: [birth year]   Care Coordination Outreach Attempts: An unsuccessful telephone outreach was attempted today to offer the patient information about available care coordination services as a benefit of their health plan.   Referral received   Follow Up Plan:  Additional outreach attempts will be made to offer the patient care coordination information and services.   Encounter Outcome:  No Answer  Julian Hy, [place] Direct Dial: [phone]

## 2022-02-06 DIAGNOSIS — N390 Urinary tract infection, site not specified: Secondary | ICD-10-CM | POA: Diagnosis not present

## 2022-02-06 NOTE — Progress Notes (Signed)
  Care Coordination  Outreach Note  02/06/2022 Name: SHAJUAN MUSSO MRN: [id number] DOB: 1945-09-03   Care Coordination Outreach Attempts: An unsuccessful telephone outreach was attempted today to offer the patient information about available care coordination services as a benefit of their health plan.   Received referral   Follow Up Plan:  Additional outreach attempts will be made to offer the patient care coordination information and services.   Encounter Outcome:  Pt. Request to Call [place], [place] Direct Dial: [phone]

## 2022-02-16 NOTE — Progress Notes (Signed)
  Care Coordination   Note   02/16/2022 Name: Donna Little MRN: [id number] DOB: 1945/08/02  Donna Little is a 76 y.o. year old female who sees Cox, Elnita Maxwell, MD for primary care. I reached out to [name] by phone today to offer care coordination services.  Referral received   [name] was given information about Care Coordination services today including:   The Care Coordination services include support from the care team which includes your Nurse Coordinator, Clinical Social Worker, or Pharmacist.  The Care Coordination team is here to help remove barriers to the health concerns and goals most important to you. Care Coordination services are voluntary, and the patient may decline or stop services at any time by request to their care team member.   Care Coordination Consent Status: Patient did not agree to participate in care coordination services at this time.  Follow up plan:  none indicated - pt declined to schedule at this time   Encounter Outcome:  Pt. Refused  Julian Hy, [place] Direct Dial: [phone]

## 2022-02-18 ENCOUNTER — Ambulatory Visit (INDEPENDENT_AMBULATORY_CARE_PROVIDER_SITE_OTHER): Payer: Medicare Other | Admitting: Internal Medicine

## 2022-02-18 ENCOUNTER — Encounter: Payer: Self-pay | Admitting: Internal Medicine

## 2022-02-18 VITALS — BP 110/60 | HR 75 | Temp 97.2°F | Resp 18 | Ht 59.0 in | Wt 114.5 lb

## 2022-02-18 DIAGNOSIS — E782 Mixed hyperlipidemia: Secondary | ICD-10-CM | POA: Diagnosis not present

## 2022-02-18 DIAGNOSIS — Z720 Tobacco use: Secondary | ICD-10-CM | POA: Diagnosis not present

## 2022-02-18 DIAGNOSIS — I700 Atherosclerosis of aorta: Secondary | ICD-10-CM | POA: Diagnosis not present

## 2022-02-18 DIAGNOSIS — J449 Chronic obstructive pulmonary disease, unspecified: Secondary | ICD-10-CM

## 2022-02-18 DIAGNOSIS — R7303 Prediabetes: Secondary | ICD-10-CM

## 2022-02-18 DIAGNOSIS — G44221 Chronic tension-type headache, intractable: Secondary | ICD-10-CM

## 2022-02-18 DIAGNOSIS — M5430 Sciatica, unspecified side: Secondary | ICD-10-CM

## 2022-02-18 DIAGNOSIS — M549 Dorsalgia, unspecified: Secondary | ICD-10-CM

## 2022-02-18 MED ORDER — AMLODIPINE BESYLATE 5 MG PO TABS: 5.0000 mg | ORAL_TABLET | Freq: Every day | ORAL | 11 refills | Status: DC

## 2022-02-18 MED ORDER — AMITRIPTYLINE HCL 10 MG PO TABS: 10.0000 mg | ORAL_TABLET | Freq: Every day | ORAL | 3 refills | Status: DC

## 2022-02-18 MED ORDER — MORPHINE SULFATE ER 15 MG PO TBCR: 15.0000 mg | EXTENDED_RELEASE_TABLET | Freq: Two times a day (BID) | ORAL | 0 refills | Status: DC

## 2022-02-18 NOTE — Progress Notes (Signed)
New Patient Office Visit  Subjective    Patient ID: Donna Little, female    DOB: [birth year]  Age: 76 y.o. MRN: [id number]  CC:  Chief Complaint  Patient presents with   New Patient (Initial Visit)    New Patient to establish.    HPI Donna Little presents to establish care 76 years old Caucasian female who was brought by his son and daughter to establish care with [place].  She has multiple medical problems including hyperlipidemia, COPD, GERD, aortic atherosclerosis, paroxysmal atrial fibrillation, congestive heart failure, polymyalgia rheumatica and chronic back pain.  She says that she has had back surgery and she has been taking pain medication for many years.  She describes this pain moderate to severe in intensity, it is sharp in nature and radiating to the left buttock.  She was given an injection in her back that has helped and she is not a candidate for any other surgery.  She said that she already has osteoporosis and she is on Prolia injection.  She is here for refill of her pain medication also.  I have reviewed her medical record from epic[facility].  Her COPD/emphysema is controlled and she take a rescue inhaler as needed but does not take any medication for chronic COPD.  She denies having any increased shortness of breath.  Her biggest problem is chronic headache.  She said that helped a case both side of her head, sometimes left side and is sharp in nature.  Per patient light does not bother her.  She has no warning.  She has a CT scan head last month at [facility] that was negative for any acute abnormality.  CT scan neck did show atherosclerosis of aorta.  She is on Lipitor 20 mg daily and denies having any side effect.  On review of her labs her LDL is at target control.  She also is prediabetic but does not take any medication.  Her last hemoglobin A1c last month was 6.0%.  Per family she has a seizure and was admitted to [facility].  Where she had EEG done and she  was started on Keppra.  No further seizure activity was noted.  She has a follow-up appointment with neurologist.  Outpatient Encounter Medications as of 02/18/2022  Medication Sig   acetaminophen (TYLENOL) 325 MG tablet Take by mouth.   albuterol (VENTOLIN HFA) 108 (90 Base) MCG/ACT inhaler Inhale 2 puffs into the lungs every 6 (six) hours as needed.    atorvastatin (LIPITOR) 40 MG tablet Take 1 tablet by mouth daily.   citalopram (CELEXA) 20 MG tablet Take 1 tablet (20 mg total) by mouth daily.   clopidogrel (PLAVIX) 75 MG tablet Take 1 tablet (75 mg total) by mouth daily.   diclofenac Sodium (VOLTAREN) 1 % GEL Apply 2 g topically 4 (four) times daily as needed.    dicyclomine (BENTYL) 20 MG tablet Take 1 tablet (20 mg total) by mouth 4 (four) times daily -  before meals and at bedtime.   estradiol (ESTRACE) 0.1 MG/GM vaginal cream Place vaginally.   furosemide (LASIX) 40 MG tablet Take 2 tablets (80 mg total) by mouth 2 (two) times daily.   gabapentin (NEURONTIN) 800 MG tablet Take 1 tablet (800 mg total) by mouth 3 (three) times daily.   KLOR-CON M20 20 MEQ tablet TAKE 1 TABLET BY MOUTH TWICE DAILY WITH A MEAL   lidocaine (LIDODERM) 5 % Place 3 patches onto the skin daily. Remove & Discard patch  within 12 hours or as directed by MD   meclizine (ANTIVERT) 25 MG tablet Take 25 mg by mouth 3 (three) times daily as needed.    morphine (MS CONTIN) 30 MG 12 hr tablet Take 1 tablet (30 mg total) by mouth in the morning, at noon, and at bedtime.   naloxone (NARCAN) nasal spray 4 mg/0.1 mL 1 spray (4 mg) intranasally into 1 nostril. Use a new Narcan(R) nasal spray for subsequent doses and administer into alternating nostrils. May repeat every 2 to 3 minutes   nitroGLYCERIN (NITROSTAT) 0.4 MG SL tablet Place under the tongue.   ondansetron (ZOFRAN-ODT) 4 MG disintegrating tablet DISSOLVE 1 TO 2 TABLETS IN MOUTH EVERY 8 HOURS AS NEEDED FOR NAUSEA   pantoprazole (PROTONIX) 40 MG tablet Take 1 tablet (40  mg total) by mouth daily.   Potassium Chloride ER 20 MEQ TBCR Take 1 tablet by mouth daily.   pramipexole (MIRAPEX) 0.25 MG tablet Take 1 tablet (0.25 mg total) by mouth at bedtime as needed.   tamsulosin (FLOMAX) 0.4 MG CAPS capsule Take by mouth.   No facility-administered encounter medications on file as of 02/18/2022.    Past Medical History:  Diagnosis Date   A-fib [facility])    Atherosclerosis of both carotid arteries 2022   noted per CT on 05/14/20   Chronic pain syndrome    Chronic systolic heart failure (HCC)    Depression    Emphysema of lung ([place]) 2022   noted per CT scan on 05/14/20   Generalized atherosclerosis    GERD (gastroesophageal reflux disease)    Hypertension    IBS (irritable bowel syndrome)    Melanoma (HCC)    Other specified nontoxic goiter    Palpitations    PMR (polymyalgia rheumatica) ([place]) 07/04/2020   Polymyalgia rheumatica ([place])    Primary pulmonary hypertension ([place])    Pulmonary embolism (HCC)    Stroke ([place])    ministroke   Supraventricular tachycardia    Type 2 diabetes mellitus ([place])     Past Surgical History:  Procedure Laterality Date   ABDOMINAL HYSTERECTOMY     APPENDECTOMY     CATARACT EXTRACTION     CORONARY ANGIOPLASTY WITH STENT PLACEMENT     KYPHOPLASTY  05/2020   L4-L5    Family History  Problem Relation Age of Onset   Heart disease Mother    Diabetes Mother    Hyperlipidemia Mother    Hypertension Mother    Stroke Mother    Heart attack Mother    Heart disease Brother    Heart attack Brother    Stroke Brother    Hypertension Brother    Hyperlipidemia Brother     Social History   Socioeconomic History   Marital status: Single    Spouse name: Not on file   Number of children: 2   Years of education: Not on file   Highest education level: Not on file  Occupational History   Occupation: retired  Tobacco Use   Smoking status: Light Smoker    Packs/day: 0.20    Years: 50.00    Total pack years: 10.00     Types: Cigarettes   Smokeless tobacco: Never   Tobacco comments:    smokes 1-2 cigarettes per week.  Vaping Use   Vaping Use: Never used  Substance and Sexual Activity   Alcohol use: No    Alcohol/week: 0.0 standard drinks of alcohol   Drug use: No   Sexual activity: Not Currently  Other  Topics Concern   Not on file  Social History Narrative   Not on file   Social Determinants of Health   Financial Resource Strain: Low Risk  (09/29/2021)   Overall Financial Resource Strain (CARDIA)    Difficulty of Paying Living Expenses: Not hard at all  Food Insecurity: No Food Insecurity (09/29/2019)   Hunger Vital Sign    Worried About Running Out of Food in the Last Year: Never true    Ran Out of Food in the Last Year: Never true  Transportation Needs: No Transportation Needs (09/29/2021)   PRAPARE - Hydrologist (Medical): No    Lack of Transportation (Non-Medical): No  Physical Activity: Not on file  Stress: Not on file  Social Connections: Not on file  Intimate Partner Violence: Not on file    Review of Systems  Constitutional: Negative.   HENT:  Negative for sore throat.   Eyes:  Negative for blurred vision, double vision and photophobia.  Respiratory:  Negative for cough, shortness of breath and wheezing.   Cardiovascular:  Negative for chest pain and palpitations.  Gastrointestinal:  Negative for abdominal pain, constipation and diarrhea.  Musculoskeletal:  Positive for back pain.  Neurological:  Positive for seizures and headaches.  Psychiatric/Behavioral:  The patient has insomnia.         Objective    BP 110/60 (BP Location: Left Arm, Patient Position: Sitting, Cuff Size: Normal)   Pulse 75   Temp (!) 97.2 F (36.2 C)   Resp 18   Ht '4\' 11"'$  (1.499 m)   Wt 114 lb 8 oz (51.9 kg)   SpO2 93%   BMI 23.13 kg/m   Physical Exam Constitutional:      Appearance: Normal appearance.  HENT:     Head: Normocephalic and atraumatic.  Eyes:      Extraocular Movements: Extraocular movements intact.     Pupils: Pupils are equal, round, and reactive to light.  Cardiovascular:     Rate and Rhythm: Normal rate and regular rhythm.  Abdominal:     General: Bowel sounds are normal.     Palpations: Abdomen is soft.  Musculoskeletal:        General: No tenderness or deformity.     Cervical back: Neck supple.  Neurological:     General: No focal deficit present.     Mental Status: She is alert and oriented to person, place, and time.  Psychiatric:        Mood and Affect: Mood normal.         Assessment & Plan:   Problem List Items Addressed This Visit       Cardiovascular and Mediastinum   Aortic atherosclerosis ([place])   Relevant Medications   amLODipine (NORVASC) 5 MG tablet     Respiratory   COPD mixed type (HCC) - Primary     Nervous and Auditory   Back pain with sciatica    Her back pain is a chronic problem with opioid dependence. Her morphine dose was decreased to 15 mg ER twice a day. She has naloxon nasal spray at home in case of over dose. She will sign [place] control substance contract that she will not take any pain medications from others.       Relevant Medications   morphine (MS CONTIN) 15 MG 12 hr tablet   amitriptyline (ELAVIL) 10 MG tablet     Other   Hyperlipidemia    She has aortic atherosclerosis, she is on  atorvastatin and her LDL in 9/23 was target control.       Relevant Medications   amLODipine (NORVASC) 5 MG tablet   Tobacco abuse    She still smoke, less than a PPD, she says she is trying to quit. Help was offered fom medical point of view.      Prediabetes    Her HbA1c was 6.0% in 9/23. She will continue to watch her diet, she can not do much because of back pain.       Chronic tension-type headache, intractable    Not sure the cause of her headache is, her CT scan head in 9/23 was negative for any acute abnormality. Insomnia is probably triggering her headache. Will start amitriptyline  10 mg at night. She will take one aleeve and tylenol with the bignning of headache.       Relevant Medications   amLODipine (NORVASC) 5 MG tablet   morphine (MS CONTIN) 15 MG 12 hr tablet   amitriptyline (ELAVIL) 10 MG tablet    No follow-ups on file.   Garwin Brothers, MD

## 2022-02-18 NOTE — Assessment & Plan Note (Signed)
Her back pain is a chronic problem with opioid dependence. Her morphine dose was decreased to 15 mg ER twice a day. She has naloxon nasal spray at home in case of over dose. She will sign [place] control substance contract that she will not take any pain medications from others.

## 2022-02-18 NOTE — Assessment & Plan Note (Signed)
Her HbA1c was 6.0% in 9/23. She will continue to watch her diet, she can not do much because of back pain.

## 2022-02-18 NOTE — Assessment & Plan Note (Signed)
She still smoke, less than a PPD, she says she is trying to quit. Help was offered fom medical point of view.

## 2022-02-18 NOTE — Assessment & Plan Note (Signed)
Not sure the cause of her headache is, her CT scan head in 9/23 was negative for any acute abnormality. Insomnia is probably triggering her headache. Will start amitriptyline 10 mg at night. She will take one aleeve and tylenol with the bignning of headache.

## 2022-02-18 NOTE — Assessment & Plan Note (Signed)
>>  ASSESSMENT AND PLAN FOR TOBACCO ABUSE WRITTEN ON 02/18/2022  9:16 PM BY AMIN, SAAD, MD  She still smoke, less than a PPD, she says she is trying to quit. Help was offered fom medical point of view.

## 2022-02-18 NOTE — Assessment & Plan Note (Signed)
She has aortic atherosclerosis, she is on atorvastatin and her LDL in 9/23 was target control.

## 2022-02-18 NOTE — Assessment & Plan Note (Signed)
>>  ASSESSMENT AND PLAN FOR PREDIABETES WRITTEN ON 02/18/2022  9:18 PM BY AMIN, SAAD, MD  Her HbA1c was 6.0% in 9/23. She will continue to watch her diet, she can not do much because of back pain.

## 2022-02-19 ENCOUNTER — Telehealth: Payer: Self-pay

## 2022-02-19 NOTE — Chronic Care Management (AMB) (Signed)
    Chronic Care Management Pharmacy Assistant   Name: Donna Little  MRN: [id number] DOB: Jul 08, 1945  Reason for Encounter: Disease State/General  Recent office visits:  02-03-2022 Harless Litten, RN (CCM)  01-27-2022 Harless Litten, RN (CCM)  12-30-2021 Effie Shy, RN. Flu vaccine  Recent consult visits:  02-19-2022 Garwin Brothers, MD. STOP Atorvastatin, Voltaren, bentyl, estradiol, meclizine, mirapex, flomax. START Amitriptyline 10 mg at bedtime, amlodipine 5 mg daily. CHANGE morphine 30 mg 3 times daily TO 15 mg every 12 hours.  01-14-2022 Owens Shark, MD (Neurology). 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   Hospital visits:  None in previous 6 months  Medications: Outpatient Encounter Medications as of 02/19/2022  Medication Sig   acetaminophen (TYLENOL) 325 MG tablet Take by mouth.   albuterol (VENTOLIN HFA) 108 (90 Base) MCG/ACT inhaler Inhale 2 puffs into the lungs every 6 (six) hours as needed.    amitriptyline (ELAVIL) 10 MG tablet Take 1 tablet (10 mg total) by mouth at bedtime.   amLODipine (NORVASC) 5 MG tablet Take 1 tablet (5 mg total) by mouth daily.   citalopram (CELEXA) 20 MG tablet Take 1 tablet (20 mg total) by mouth daily.   clopidogrel (PLAVIX) 75 MG tablet Take 1 tablet (75 mg total) by mouth daily.   furosemide (LASIX) 40 MG tablet Take 2 tablets (80 mg total) by mouth 2 (two) times daily.   gabapentin (NEURONTIN) 800 MG tablet Take 1 tablet (800 mg total) by mouth 3 (three) times daily.   KLOR-CON M20 20 MEQ tablet TAKE 1 TABLET BY MOUTH TWICE DAILY WITH A MEAL   lidocaine (LIDODERM) 5 % Place 3 patches onto the skin daily. Remove & Discard patch within 12 hours or as directed by MD   morphine (MS CONTIN) 15 MG 12 hr tablet Take 1 tablet (15 mg total) by mouth every 12 (twelve) hours.   naloxone (NARCAN) nasal spray 4 mg/0.1 mL 1 spray (4 mg) intranasally into 1 nostril. Use a new Narcan(R)  nasal spray for subsequent doses and administer into alternating nostrils. May repeat every 2 to 3 minutes   nitroGLYCERIN (NITROSTAT) 0.4 MG SL tablet Place under the tongue.   ondansetron (ZOFRAN-ODT) 4 MG disintegrating tablet DISSOLVE 1 TO 2 TABLETS IN MOUTH EVERY 8 HOURS AS NEEDED FOR NAUSEA   pantoprazole (PROTONIX) 40 MG tablet Take 1 tablet (40 mg total) by mouth daily.   Potassium Chloride ER 20 MEQ TBCR Take 1 tablet by mouth daily.   No facility-administered encounter medications on file as of 02/19/2022.    02-19-2022: 1st attempt left VM 02-23-2022: 2nd attempt left VM 02-24-2022: 3rd attempt left VM  Care Gaps: Hep C screening overdue Yearly ophthalmology overdue Yearly foot exam overdue A1C overdue Last annual wellness visit? 04-09-2021  Star Rating Drugs: Atorvastatin 40 mg- Last filled 90 DS [facility]. Previous filled 08-04-2021 90 DS   [place] Clinical Pharmacist Assistant [phone]

## 2022-03-10 ENCOUNTER — Telehealth: Payer: Medicare Other

## 2022-03-10 ENCOUNTER — Other Ambulatory Visit: Payer: Self-pay

## 2022-03-10 MED ORDER — FUROSEMIDE 40 MG PO TABS: 80.0000 mg | ORAL_TABLET | Freq: Two times a day (BID) | ORAL | 0 refills | Status: DC

## 2022-03-13 ENCOUNTER — Encounter: Payer: Medicare Other | Admitting: Family Medicine

## 2022-03-13 NOTE — Progress Notes (Unsigned)
Subjective:  Patient ID: Donna Little, female    DOB: 11-10-1945  Age: 76 y.o. MRN: [id number]  Chief Complaint  Patient presents with   Hypertension   Hyperlipidemia     Hyperlipidemia:  Patient is currently taking atorvastatin 40 mg 1 tablet daily.  Pre-diabetes: Last HbA1c: 6.0% Depression:  Patient is currently taking Celexa 20 mg take 1 tablet daily.  GERD: Patient is currently taking Pantoprazole 40 mg take 1 tablet daily.  Chronic Pain Syndrome: Patient has chronic pain related to her entire back.  8/10. Patient has osteoporosis and has had compression fractures.  She is on gabapentin 800 mg 3 times daily, lidoderm patches, and MS Contin 30 mg 3 times daily. Tries to take twice daily. Patient is not candidate for surgery. Has narcan.   Patient saw eye doctor. He identified a cholesterol placque that went to her eye. He referred her back to cardiology, who ordered carotid dopplers. She does not know results yet. No medication changes. Already on plavix and lipitor.   Patient having left eye pain and headache. Sharp stabbing pains about once a week.  Only lasts for less than 5 seconds.  IBS: on bentyl 20 mg before meals.  Hypertension:  Furosemide 40 mg 2 pills twice a day.    Tobacco use: 1 pack every 2 weeks.   Urinary hesitancy improved with tamsulosin. Patient sees Urology.     Current Outpatient Medications on File Prior to Visit  Medication Sig Dispense Refill   acetaminophen (TYLENOL) 325 MG tablet Take by mouth.     albuterol (VENTOLIN HFA) 108 (90 Base) MCG/ACT inhaler Inhale 2 puffs into the lungs every 6 (six) hours as needed.      amitriptyline (ELAVIL) 10 MG tablet Take 1 tablet (10 mg total) by mouth at bedtime. 30 tablet 3   amLODipine (NORVASC) 5 MG tablet Take 1 tablet (5 mg total) by mouth daily. 30 tablet 11   citalopram (CELEXA) 20 MG tablet Take 1 tablet (20 mg total) by mouth daily. 90 tablet 3   clopidogrel (PLAVIX) 75 MG tablet Take 1 tablet  (75 mg total) by mouth daily. 90 tablet 1   furosemide (LASIX) 40 MG tablet Take 2 tablets (80 mg total) by mouth 2 (two) times daily. 360 tablet 0   gabapentin (NEURONTIN) 800 MG tablet Take 1 tablet (800 mg total) by mouth 3 (three) times daily. 270 tablet 1   KLOR-CON M20 20 MEQ tablet TAKE 1 TABLET BY MOUTH TWICE DAILY WITH A MEAL 180 tablet 0   lidocaine (LIDODERM) 5 % Place 3 patches onto the skin daily. Remove & Discard patch within 12 hours or as directed by MD 90 patch 3   morphine (MS CONTIN) 15 MG 12 hr tablet Take 1 tablet (15 mg total) by mouth every 12 (twelve) hours. 60 tablet 0   naloxone (NARCAN) nasal spray 4 mg/0.1 mL 1 spray (4 mg) intranasally into 1 nostril. Use a new Narcan(R) nasal spray for subsequent doses and administer into alternating nostrils. May repeat every 2 to 3 minutes 2 each 1   nitroGLYCERIN (NITROSTAT) 0.4 MG SL tablet Place under the tongue.     ondansetron (ZOFRAN-ODT) 4 MG disintegrating tablet DISSOLVE 1 TO 2 TABLETS IN MOUTH EVERY 8 HOURS AS NEEDED FOR NAUSEA 40 tablet 0   pantoprazole (PROTONIX) 40 MG tablet Take 1 tablet (40 mg total) by mouth daily. 90 tablet 1   Potassium Chloride ER 20 MEQ TBCR Take 1 tablet by mouth  daily. 90 tablet 1   No current facility-administered medications on file prior to visit.   Past Medical History:  Diagnosis Date   A-fib [facility])    Atherosclerosis of both carotid arteries 2022   noted per CT on 05/14/20   Chronic pain syndrome    Chronic systolic heart failure (HCC)    Depression    Emphysema of lung (HCC) 2022   noted per CT scan on 05/14/20   Generalized atherosclerosis    GERD (gastroesophageal reflux disease)    Hypertension    IBS (irritable bowel syndrome)    Melanoma (HCC)    Other specified nontoxic goiter    Palpitations    PMR (polymyalgia rheumatica) (HCC) 07/04/2020   Polymyalgia rheumatica (HCC)    Primary pulmonary hypertension (HCC)    Pulmonary embolism (HCC)    Stroke (HCC)    ministroke    Supraventricular tachycardia    Type 2 diabetes mellitus (HCC)    Past Surgical History:  Procedure Laterality Date   ABDOMINAL HYSTERECTOMY     APPENDECTOMY     CATARACT EXTRACTION     CORONARY ANGIOPLASTY WITH STENT PLACEMENT     KYPHOPLASTY  05/2020   L4-L5    Family History  Problem Relation Age of Onset   Heart disease Mother    Diabetes Mother    Hyperlipidemia Mother    Hypertension Mother    Stroke Mother    Heart attack Mother    Heart disease Brother    Heart attack Brother    Stroke Brother    Hypertension Brother    Hyperlipidemia Brother    Social History   Socioeconomic History   Marital status: Single    Spouse name: Not on file   Number of children: 2   Years of education: Not on file   Highest education level: Not on file  Occupational History   Occupation: retired  Tobacco Use   Smoking status: Light Smoker    Packs/day: 0.20    Years: 50.00    Total pack years: 10.00    Types: Cigarettes   Smokeless tobacco: Never   Tobacco comments:    smokes 1-2 cigarettes per week.  Vaping Use   Vaping Use: Never used  Substance and Sexual Activity   Alcohol use: No    Alcohol/week: 0.0 standard drinks of alcohol   Drug use: No   Sexual activity: Not Currently  Other Topics Concern   Not on file  Social History Narrative   Not on file   Social Determinants of Health   Financial Resource Strain: Low Risk  (09/29/2021)   Overall Financial Resource Strain (CARDIA)    Difficulty of Paying Living Expenses: Not hard at all  Food Insecurity: No Food Insecurity (09/29/2019)   Hunger Vital Sign    Worried About Running Out of Food in the Last Year: Never true    Ran Out of Food in the Last Year: Never true  Transportation Needs: No Transportation Needs (09/29/2021)   PRAPARE - Administrator, Civil Service (Medical): No    Lack of Transportation (Non-Medical): No  Physical Activity: Not on file  Stress: Not on file  Social Connections:  Not on file    Review of Systems  Constitutional:  Negative for appetite change, fatigue and fever.  HENT:  Negative for congestion, ear pain, sinus pressure and sore throat.   Respiratory:  Negative for cough, chest tightness, shortness of breath and wheezing.   Cardiovascular:  Negative for  chest pain and palpitations.  Gastrointestinal:  Negative for abdominal pain, constipation, diarrhea, nausea and vomiting.  Genitourinary:  Negative for dysuria and hematuria.  Musculoskeletal:  Negative for arthralgias, back pain, joint swelling and myalgias.  Skin:  Negative for rash.  Neurological:  Negative for dizziness, weakness and headaches.  Psychiatric/Behavioral:  Negative for dysphoric mood. The patient is not nervous/anxious.      Objective:  There were no vitals taken for this visit.     02/18/2022   10:47 AM 12/11/2021   12:00 PM 12/11/2021   11:47 AM  BP/Weight  Systolic BP 110 100 80  Diastolic BP 60 52 56  Wt. (Lbs) 114.5    BMI 23.13 kg/m2      Physical Exam Vitals reviewed.  Constitutional:      Appearance: Normal appearance. She is normal weight.  Cardiovascular:     Rate and Rhythm: Normal rate and regular rhythm.     Heart sounds: Normal heart sounds.  Pulmonary:     Effort: Pulmonary effort is normal.     Breath sounds: Normal breath sounds.  Abdominal:     General: Abdomen is flat. Bowel sounds are normal.     Palpations: Abdomen is soft.  Neurological:     Mental Status: She is alert and oriented to person, place, and time.  Psychiatric:        Mood and Affect: Mood normal.        Behavior: Behavior normal.     Diabetic Foot Exam - Simple   No data filed      Lab Results  Component Value Date   WBC 6.3 12/11/2021   HGB 15.0 12/11/2021   HCT 45.2 12/11/2021   PLT 188 12/11/2021   GLUCOSE 65 (L) 12/11/2021   CHOL 113 12/11/2021   TRIG 78 12/11/2021   HDL 40 12/11/2021   LDLCALC 57 12/11/2021   ALT 12 12/11/2021   AST 22 12/11/2021   NA  139 12/11/2021   K 4.7 12/11/2021   CL 98 12/11/2021   CREATININE 0.92 12/11/2021   BUN 18 12/11/2021   CO2 30 (H) 12/11/2021   TSH 0.714 12/11/2021   HGBA1C 6.0 (H) 12/11/2021   MICROALBUR 30 01/20/2020      Assessment & Plan:   Problem List Items Addressed This Visit       Cardiovascular and Mediastinum   Hypertensive heart disease with heart failure (HCC)   Aortic atherosclerosis (HCC)   Intracranial vascular disease     Respiratory   COPD mixed type (HCC)     Digestive   GERD (gastroesophageal reflux disease)     Endocrine   Combined hyperlipidemia associated with type 2 diabetes mellitus (HCC)     Immune and Lymphatic   Lymphadenopathy, cervical     Other   Hyperlipidemia - Primary   Tobacco abuse   Mild recurrent major depression (HCC)   Chronic midline thoracic back pain  .  No orders of the defined types were placed in this encounter.   No orders of the defined types were placed in this encounter.    Follow-up: No follow-ups on file.  An After Visit Summary was printed and given to the patient.  Blane Ohara, MD [facility] [phone]

## 2022-03-17 DIAGNOSIS — R569 Unspecified convulsions: Secondary | ICD-10-CM | POA: Diagnosis not present

## 2022-03-17 DIAGNOSIS — I6783 Posterior reversible encephalopathy syndrome: Secondary | ICD-10-CM | POA: Diagnosis not present

## 2022-03-17 DIAGNOSIS — I10 Essential (primary) hypertension: Secondary | ICD-10-CM | POA: Diagnosis not present

## 2022-03-18 ENCOUNTER — Encounter: Payer: Self-pay | Admitting: Internal Medicine

## 2022-03-18 ENCOUNTER — Ambulatory Visit: Payer: Medicare Other | Admitting: Internal Medicine

## 2022-03-18 ENCOUNTER — Telehealth: Payer: Medicare Other

## 2022-03-18 VITALS — BP 120/76 | HR 86 | Temp 97.8°F | Resp 18 | Ht 59.0 in | Wt 113.5 lb

## 2022-03-18 DIAGNOSIS — I700 Atherosclerosis of aorta: Secondary | ICD-10-CM | POA: Diagnosis not present

## 2022-03-18 DIAGNOSIS — J449 Chronic obstructive pulmonary disease, unspecified: Secondary | ICD-10-CM | POA: Diagnosis not present

## 2022-03-18 DIAGNOSIS — I10 Essential (primary) hypertension: Secondary | ICD-10-CM

## 2022-03-18 DIAGNOSIS — Z720 Tobacco use: Secondary | ICD-10-CM | POA: Diagnosis not present

## 2022-03-18 DIAGNOSIS — M549 Dorsalgia, unspecified: Secondary | ICD-10-CM | POA: Diagnosis not present

## 2022-03-18 DIAGNOSIS — M5430 Sciatica, unspecified side: Secondary | ICD-10-CM | POA: Diagnosis not present

## 2022-03-18 DIAGNOSIS — F330 Major depressive disorder, recurrent, mild: Secondary | ICD-10-CM | POA: Diagnosis not present

## 2022-03-18 MED ORDER — LEVETIRACETAM 500 MG PO TABS: 500.0000 mg | ORAL_TABLET | Freq: Two times a day (BID) | ORAL | 6 refills | Status: DC

## 2022-03-18 MED ORDER — MORPHINE SULFATE ER 15 MG PO TBCR: 15.0000 mg | EXTENDED_RELEASE_TABLET | Freq: Two times a day (BID) | ORAL | 0 refills | Status: DC

## 2022-03-18 NOTE — Assessment & Plan Note (Signed)
Continue with morphine 15 mg twice a day.

## 2022-03-18 NOTE — Assessment & Plan Note (Signed)
Citalopram also lower seizure threshold, will discuss with psychiatrist

## 2022-03-18 NOTE — Assessment & Plan Note (Signed)
Her blood pressure is controlled.  

## 2022-03-18 NOTE — Assessment & Plan Note (Signed)
>>  ASSESSMENT AND PLAN FOR PRIMARY HYPERTENSION WRITTEN ON 03/18/2022 11:01 AM BY AMIN, SAAD, MD  Her blood pressure is controlled.

## 2022-03-18 NOTE — Assessment & Plan Note (Signed)
>>  ASSESSMENT AND PLAN FOR TOBACCO ABUSE WRITTEN ON 03/18/2022 10:59 AM BY AMIN, SAAD, MD  She will cutback on tobacco use

## 2022-03-18 NOTE — Progress Notes (Signed)
Established Patient Office Visit  Subjective   Patient ID: Donna Little, female    DOB: 11-19-1945  Age: 76 y.o. MRN: [id number]  Chief Complaint  Patient presents with   COPD   Hypertension   Headache   Back Pain   Prediabetes    COPD She complains of shortness of breath. There is no wheezing. Associated symptoms include headaches. Her past medical history is significant for COPD.  Hypertension Associated symptoms include headaches and shortness of breath.  Headache  Associated symptoms include back pain. Her past medical history is significant for hypertension.  Back Pain Associated symptoms include headaches.   Donna Little presents for follow up. She says she is still depress. She has been taking citalopram 20 mg for long time.   She is accompanied by her son. Per son depression is the worse problem for her. She has multiple medical problems including hyperlipidemia, COPD, GERD, aortic atherosclerosis, paroxysmal atrial fibrillation, congestive heart failure, polymyalgia rheumatica and chronic back pain.  She says that she has had back surgery and she has been taking pain medication for many years.  She describes this pain moderate to severe in intensity, it is sharp in nature and radiating to the left buttock.  She was given an injection in her back that has helped and she is not a candidate for any other surgery.  She said that she already has osteoporosis and she is on Prolia injection.  She is here for refill of her morphine does not radiate.    I have reviewed her medical record from [facility].  Her COPD/emphysema is controlled and she take a rescue inhaler as needed but does not take any medication for chronic COPD.  She denies having any increased shortness of breath.   CT scan neck did show atherosclerosis of aorta.  She is on Lipitor 20 mg daily and denies having any side effect.  On review of her labs her LDL is at target control.   She also is prediabetic but does not  take any medication.  Her last hemoglobin A1c last month was 6.0%.   She was seen by neurologist at [facility] and no medications were changed. She is on Keppra.    Review of Systems  Constitutional: Negative.   Respiratory:  Positive for shortness of breath. Negative for wheezing.   Musculoskeletal:  Positive for back pain.  Neurological:  Positive for headaches.  Psychiatric/Behavioral:  Positive for depression. Negative for suicidal ideas. The patient is not nervous/anxious.       Objective:     BP 120/76 (BP Location: Left Arm, Patient Position: Sitting)   Pulse 86   Temp 97.8 F (36.6 C)   Resp 18   Ht '4\' 11"'$  (1.499 m)   Wt 113 lb 8 oz (51.5 kg)   SpO2 91%   BMI 22.92 kg/m    Physical Exam Constitutional:      Appearance: She is normal weight.  HENT:     Head: Normocephalic and atraumatic.  Eyes:     Extraocular Movements: Extraocular movements intact.     Pupils: Pupils are equal, round, and reactive to light.  Cardiovascular:     Rate and Rhythm: Normal rate and regular rhythm.     Heart sounds: Normal heart sounds.  Pulmonary:     Effort: Pulmonary effort is normal.     Breath sounds: Normal breath sounds.  Abdominal:     General: Bowel sounds are normal.     Palpations: Abdomen is  soft.  Neurological:     Mental Status: She is alert and oriented to person, place, and time.      No results found for any visits on 03/18/22.    The ASCVD Risk score (Arnett DK, et al., 2019) failed to calculate for the following reasons:   The patient has a prior MI or stroke diagnosis    Assessment & Plan:   Problem List Items Addressed This Visit       Cardiovascular and Mediastinum   Aortic atherosclerosis ([place]) - Primary   Relevant Medications   atorvastatin (LIPITOR) 40 MG tablet     Respiratory   COPD mixed type (HCC)   Relevant Medications   nicotine (NICODERM CQ - DOSED IN MG/24 HOURS) 21 mg/24hr patch   predniSONE (STERAPRED UNI-PAK 21 TAB) 5 MG (21)  TBPK tablet     Nervous and Auditory   Back pain with sciatica   Relevant Medications   nicotine (NICODERM CQ - DOSED IN MG/24 HOURS) 21 mg/24hr patch   levETIRAcetam (KEPPRA) 500 MG tablet   predniSONE (STERAPRED UNI-PAK 21 TAB) 5 MG (21) TBPK tablet     Other   Tobacco abuse   Mild recurrent major depression (HCC)    No follow-ups on file.    Garwin Brothers, MD

## 2022-03-18 NOTE — Assessment & Plan Note (Signed)
Continue with lipitor.

## 2022-03-18 NOTE — Assessment & Plan Note (Signed)
She will cutback on tobacco use

## 2022-03-23 ENCOUNTER — Ambulatory Visit: Payer: Medicare Other

## 2022-03-27 ENCOUNTER — Telehealth: Payer: Self-pay | Admitting: Internal Medicine

## 2022-03-27 NOTE — Telephone Encounter (Signed)
12.21.23 pt called into the office to schedule her prolia shot. I notice that the pcp was changed to [facility]. I ask the pt if she transferred her medical care to different office she seem confused with my question. She said no that she was in the hospital not to long along and she stated that our office didn't have an open for to be seen for a hospital follow up but she need immediate care so she went to [place] [place]. She asked where [facility] was located. I told her up above our office. I was concerned with our conversation. I told the pt I will call her back that I was going to look into this information.   I called [facility] and asked if this pt was est at their practice and it was confirmed that she was seen 3 times since was est at their practice.   I called [facility] back and told her we are no longer your pcp. Pt was all confused and upset and she stated she will have to call back.   12.22.23 I called the pt back to check in after speaking with Dr.Cox that we want her to say with [place] and she could fill out a medical record release we could send it to [facility] and [facility] can be her pcp again. Pt still was confused and she said she was going to sit down with her daughter to talk about what is going on. She said she call after new year.   I gave my report to our practice admin Damon and to [employer], lpn. We all agree something seems off about how she was not seen in our office due to our hospital follow up policy procured and that the pt is so confused with the information. I will follow up when we return from christmas break to check in on her.

## 2022-04-02 NOTE — Telephone Encounter (Signed)
update: pt has talked with her kids. when pt first gotten sick and the pt was getting switched back and forth to hospitals and [facility] the kids were getting scared and they were told to call [facility] and which Elizabethann nor the kids truly didn't know it would transfer her care as primary care from [facility]. I spoke with dr. [facility] she said if pt can come into the office tomorrow for a nurse to get her prolia shot then she can fill out a record release. pt was clear she wants to be with [facility].

## 2022-04-03 ENCOUNTER — Ambulatory Visit (INDEPENDENT_AMBULATORY_CARE_PROVIDER_SITE_OTHER): Payer: Medicare Other

## 2022-04-03 DIAGNOSIS — M810 Age-related osteoporosis without current pathological fracture: Secondary | ICD-10-CM

## 2022-04-03 MED ORDER — DENOSUMAB 60 MG/ML ~~LOC~~ SOSY
60.0000 mg | PREFILLED_SYRINGE | Freq: Once | SUBCUTANEOUS | Status: AC
  Administered 2022-04-03: 60 mg via SUBCUTANEOUS

## 2022-04-03 NOTE — Progress Notes (Signed)
   Prolia injection given per order, patient tolerated well.   Erie Noe, LPN 58:59 AM

## 2022-04-16 ENCOUNTER — Other Ambulatory Visit: Payer: Self-pay

## 2022-04-16 MED ORDER — MORPHINE SULFATE ER 15 MG PO TBCR: 15.0000 mg | EXTENDED_RELEASE_TABLET | Freq: Two times a day (BID) | ORAL | 0 refills | Status: DC

## 2022-04-22 ENCOUNTER — Ambulatory Visit (INDEPENDENT_AMBULATORY_CARE_PROVIDER_SITE_OTHER): Payer: Medicare Other | Admitting: Family Medicine

## 2022-04-22 ENCOUNTER — Ambulatory Visit: Payer: Medicare Other | Admitting: Internal Medicine

## 2022-04-22 ENCOUNTER — Other Ambulatory Visit: Payer: Self-pay | Admitting: Family Medicine

## 2022-04-22 VITALS — BP 106/60 | HR 78 | Temp 96.7°F | Resp 18 | Ht 59.0 in | Wt 114.0 lb

## 2022-04-22 DIAGNOSIS — M65341 Trigger finger, right ring finger: Secondary | ICD-10-CM | POA: Diagnosis not present

## 2022-04-22 DIAGNOSIS — M65331 Trigger finger, right middle finger: Secondary | ICD-10-CM | POA: Diagnosis not present

## 2022-04-22 DIAGNOSIS — F331 Major depressive disorder, recurrent, moderate: Secondary | ICD-10-CM | POA: Insufficient documentation

## 2022-04-22 DIAGNOSIS — F322 Major depressive disorder, single episode, severe without psychotic features: Secondary | ICD-10-CM | POA: Diagnosis not present

## 2022-04-22 DIAGNOSIS — F32A Depression, unspecified: Secondary | ICD-10-CM | POA: Insufficient documentation

## 2022-04-22 MED ORDER — DULOXETINE HCL 60 MG PO CPEP: 60.0000 mg | ORAL_CAPSULE | Freq: Every day | ORAL | 1 refills | Status: DC

## 2022-04-22 NOTE — Patient Instructions (Addendum)
Decrease citalopram to 20 mg 1/2 daily x 1 week, then discontinue.  Start duloxetine 60 mg daily at night.

## 2022-04-22 NOTE — Progress Notes (Signed)
Subjective:  Patient ID: Donna Little, female    DOB: 04/05/1946  Age: 76 y.o. MRN: [id number]  Chief Complaint  Patient presents with   trigger finger   HPI Complaining of right trigger fingers. Requesting injections.  Patient is having severe depression. Medicine citalopram does not seem to help. Much of her depression is related to her chronic back pain.     04/22/2022   10:34 AM 03/18/2022   10:17 AM 12/11/2021   11:47 AM  PHQ9 SCORE ONLY  PHQ-9 Total Score '24 6 9     '$ Current Outpatient Medications on File Prior to Visit  Medication Sig Dispense Refill   acetaminophen (TYLENOL) 325 MG tablet Take by mouth.     albuterol (VENTOLIN HFA) 108 (90 Base) MCG/ACT inhaler Inhale 2 puffs into the lungs every 6 (six) hours as needed.      amitriptyline (ELAVIL) 10 MG tablet Take 1 tablet (10 mg total) by mouth at bedtime. 30 tablet 3   amLODipine (NORVASC) 5 MG tablet Take 1 tablet (5 mg total) by mouth daily. 30 tablet 11   atorvastatin (LIPITOR) 40 MG tablet Take by mouth.     cholecalciferol (VITAMIN D3) 25 MCG (1000 UNIT) tablet Take by mouth.     clopidogrel (PLAVIX) 75 MG tablet Take 1 tablet (75 mg total) by mouth daily. 90 tablet 1   denosumab (PROLIA) 60 MG/ML SOSY injection Inject into the skin.     fluocinonide (LIDEX) 0.05 % external solution Apply topically 2 (two) times daily.     furosemide (LASIX) 40 MG tablet Take 2 tablets (80 mg total) by mouth 2 (two) times daily. (Patient taking differently: Take 40 mg by mouth 2 (two) times daily.) 360 tablet 0   KLOR-CON M20 20 MEQ tablet TAKE 1 TABLET BY MOUTH TWICE DAILY WITH A MEAL (Patient taking differently: 20 mEq daily.) 180 tablet 0   levETIRAcetam (KEPPRA) 500 MG tablet Take 1 tablet (500 mg total) by mouth 2 (two) times daily. 60 tablet 6   lidocaine (LIDODERM) 5 % Place 3 patches onto the skin daily. Remove & Discard patch within 12 hours or as directed by MD 90 patch 3   meclizine (ANTIVERT) 25 MG tablet Take by mouth.      morphine (MS CONTIN) 15 MG 12 hr tablet Take 1 tablet (15 mg total) by mouth every 12 (twelve) hours. 60 tablet 0   naloxone (NARCAN) nasal spray 4 mg/0.1 mL 1 spray (4 mg) intranasally into 1 nostril. Use a new Narcan(R) nasal spray for subsequent doses and administer into alternating nostrils. May repeat every 2 to 3 minutes 2 each 1   nitroGLYCERIN (NITROSTAT) 0.4 MG SL tablet Place under the tongue.     pantoprazole (PROTONIX) 40 MG tablet Take 1 tablet (40 mg total) by mouth daily. 90 tablet 1   thiamine (VITAMIN B1) 100 MG tablet Take by mouth.     No current facility-administered medications on file prior to visit.   Past Medical History:  Diagnosis Date   A-fib [facility])    Atherosclerosis of both carotid arteries 2022   noted per CT on 05/14/20   Chronic pain syndrome    Chronic systolic heart failure ([place])    Depression    Emphysema of lung ([place]) 2022   noted per CT scan on 05/14/20   Generalized atherosclerosis    GERD (gastroesophageal reflux disease)    Hypertension    IBS (irritable bowel syndrome)    Melanoma ([place])  Other specified nontoxic goiter    Palpitations    PMR (polymyalgia rheumatica) (HCC) 07/04/2020   Polymyalgia rheumatica ([place])    Primary pulmonary hypertension (HCC)    Pulmonary embolism (HCC)    Stroke (HCC)    ministroke   Supraventricular tachycardia    Type 2 diabetes mellitus ([place])    Past Surgical History:  Procedure Laterality Date   ABDOMINAL HYSTERECTOMY     APPENDECTOMY     CATARACT EXTRACTION     CORONARY ANGIOPLASTY WITH STENT PLACEMENT     KYPHOPLASTY  05/2020   L4-L5    Family History  Problem Relation Age of Onset   Heart disease Mother    Diabetes Mother    Hyperlipidemia Mother    Hypertension Mother    Stroke Mother    Heart attack Mother    Heart disease Brother    Heart attack Brother    Stroke Brother    Hypertension Brother    Hyperlipidemia Brother    Social History   Socioeconomic History   Marital  status: Single    Spouse name: Not on file   Number of children: 2   Years of education: Not on file   Highest education level: Not on file  Occupational History   Occupation: retired  Tobacco Use   Smoking status: Light Smoker    Packs/day: 0.20    Years: 50.00    Total pack years: 10.00    Types: Cigarettes   Smokeless tobacco: Never   Tobacco comments:    smokes 1-2 cigarettes per week.  Vaping Use   Vaping Use: Never used  Substance and Sexual Activity   Alcohol use: No    Alcohol/week: 0.0 standard drinks of alcohol   Drug use: No   Sexual activity: Not Currently  Other Topics Concern   Not on file  Social History Narrative   Not on file   Social Determinants of Health   Financial Resource Strain: Low Risk  (09/29/2021)   Overall Financial Resource Strain (CARDIA)    Difficulty of Paying Living Expenses: Not hard at all  Food Insecurity: No Food Insecurity (09/29/2019)   Hunger Vital Sign    Worried About Running Out of Food in the Last Year: Never true    Ran Out of Food in the Last Year: Never true  Transportation Needs: No Transportation Needs (09/29/2021)   PRAPARE - Hydrologist (Medical): No    Lack of Transportation (Non-Medical): No  Physical Activity: Not on file  Stress: Not on file  Social Connections: Not on file    Review of Systems  Constitutional:  Positive for fatigue. Negative for chills and fever.  HENT:  Negative for congestion.   Respiratory:  Positive for cough and shortness of breath. Negative for wheezing.   Cardiovascular:  Positive for chest pain (feels this way when she is anxious) and palpitations.  Gastrointestinal:  Positive for abdominal pain.  Genitourinary:  Negative for dysuria.  Musculoskeletal:  Positive for arthralgias (Bilateral knee, hip and hand pain) and back pain.  Neurological:  Positive for light-headedness and headaches.  Psychiatric/Behavioral:  Positive for dysphoric mood. The patient is  nervous/anxious.      Objective:  BP 106/60   Pulse 78   Temp (!) 96.7 F (35.9 C)   Resp 18   Ht '4\' 11"'$  (1.499 m)   Wt 114 lb (51.7 kg)   BMI 23.03 kg/m      04/22/2022   10:20 AM  03/18/2022   10:10 AM 02/18/2022   10:47 AM  BP/Weight  Systolic BP 315 945 859  Diastolic BP 60 76 60  Wt. (Lbs) 114 113.5 114.5  BMI 23.03 kg/m2 22.92 kg/m2 23.13 kg/m2    Physical Exam Vitals reviewed.  Constitutional:      Appearance: Normal appearance. She is normal weight.  Cardiovascular:     Rate and Rhythm: Normal rate and regular rhythm.     Heart sounds: Normal heart sounds.  Pulmonary:     Effort: Pulmonary effort is normal. No respiratory distress.     Breath sounds: Normal breath sounds.  Musculoskeletal:        General: Tenderness (right trigger fingers: 3rd and 4th digits.) present.  Neurological:     Mental Status: She is alert and oriented to person, place, and time.  Psychiatric:        Mood and Affect: Mood normal.        Behavior: Behavior normal.     Diabetic Foot Exam - Simple   No data filed      Lab Results  Component Value Date   WBC 6.3 12/11/2021   HGB 15.0 12/11/2021   HCT 45.2 12/11/2021   PLT 188 12/11/2021   GLUCOSE 65 (L) 12/11/2021   CHOL 113 12/11/2021   TRIG 78 12/11/2021   HDL 40 12/11/2021   LDLCALC 57 12/11/2021   ALT 12 12/11/2021   AST 22 12/11/2021   NA 139 12/11/2021   K 4.7 12/11/2021   CL 98 12/11/2021   CREATININE 0.92 12/11/2021   BUN 18 12/11/2021   CO2 30 (H) 12/11/2021   TSH 0.714 12/11/2021   HGBA1C 6.0 (H) 12/11/2021   MICROALBUR 30 01/20/2020      Assessment & Plan:   Donna Little was seen today for trigger finger.  Moderately severe depression Assessment & Plan: Decrease citalopram to 20 mg 1/2 daily x 1 week, then discontinue.  Start duloxetine 60 mg daily at night.   Trigger finger, right middle finger Assessment & Plan: Risks were discussed including bleeding, infection, increase in sugars if diabetic,  atrophy at site of injection, and increased pain.  After consent was obtained, using sterile technique the trigger finger (3rd right digit) was prepped with alcohol.  The palm was entered at the base of the trigger finger and kenalog 20 mg and 0.5 ml plain Lidocaine was then injected and the needle withdrawn.  The procedure was well tolerated.   It may be more painful for the first 1-2 days.  Watch for fever, or increased swelling or persistent pain in the joint.  Call or return to clinic prn if such symptoms occur or there is failure to improve as anticipated.    Trigger finger, right ring finger Assessment & Plan: Risks were discussed including bleeding, infection, increase in sugars if diabetic, atrophy at site of injection, and increased pain.  After consent was obtained, using sterile technique the trigger finger (right fourth finger) was prepped with alcohol.  The palm was entered at the base of the trigger finger and kenalog 20 mg and 0.5 ml plain Lidocaine was then injected and the needle withdrawn.  The procedure was well tolerated.   It may be more painful for the first 1-2 days.  Watch for fever, or increased swelling or persistent pain in the joint.  Call or return to clinic prn if such symptoms occur or there is failure to improve as anticipated.       Meds ordered this encounter  Medications   DISCONTD: DULoxetine (CYMBALTA) 60 MG capsule    Sig: Take 1 capsule (60 mg total) by mouth daily.    Dispense:  30 capsule    Refill:  1    Follow-up: Return in about 6 weeks (around 06/03/2022) for chronic fasting.  An After Visit Summary was printed and given to the patient.  [name], MD [facility] [phone]

## 2022-04-26 ENCOUNTER — Encounter: Payer: Self-pay | Admitting: Family Medicine

## 2022-04-26 NOTE — Assessment & Plan Note (Signed)
Risks were discussed including bleeding, infection, increase in sugars if diabetic, atrophy at site of injection, and increased pain.  After consent was obtained, using sterile technique the trigger finger (right fourth finger) was prepped with alcohol.  The palm was entered at the base of the trigger finger and kenalog 20 mg and 0.5 ml plain Lidocaine was then injected and the needle withdrawn.  The procedure was well tolerated.   It may be more painful for the first 1-2 days.  Watch for fever, or increased swelling or persistent pain in the joint.  Call or return to clinic prn if such symptoms occur or there is failure to improve as anticipated.

## 2022-04-26 NOTE — Assessment & Plan Note (Signed)
Decrease citalopram to 20 mg 1/2 daily x 1 week, then discontinue.  Start duloxetine 60 mg daily at night.

## 2022-04-26 NOTE — Assessment & Plan Note (Addendum)
Risks were discussed including bleeding, infection, increase in sugars if diabetic, atrophy at site of injection, and increased pain.  After consent was obtained, using sterile technique the trigger finger (3rd right digit) was prepped with alcohol.  The palm was entered at the base of the trigger finger and kenalog 20 mg and 0.5 ml plain Lidocaine was then injected and the needle withdrawn.  The procedure was well tolerated.   It may be more painful for the first 1-2 days.  Watch for fever, or increased swelling or persistent pain in the joint.  Call or return to clinic prn if such symptoms occur or there is failure to improve as anticipated.

## 2022-05-13 ENCOUNTER — Other Ambulatory Visit: Payer: Self-pay

## 2022-05-13 MED ORDER — MORPHINE SULFATE ER 15 MG PO TBCR: 15.0000 mg | EXTENDED_RELEASE_TABLET | Freq: Two times a day (BID) | ORAL | 0 refills | Status: DC

## 2022-06-03 ENCOUNTER — Encounter: Payer: Self-pay | Admitting: Family Medicine

## 2022-06-03 ENCOUNTER — Ambulatory Visit (INDEPENDENT_AMBULATORY_CARE_PROVIDER_SITE_OTHER): Payer: Medicare Other | Admitting: Family Medicine

## 2022-06-03 VITALS — BP 132/70 | HR 80 | Temp 96.7°F | Resp 18 | Ht 59.0 in | Wt 114.4 lb

## 2022-06-03 DIAGNOSIS — R202 Paresthesia of skin: Secondary | ICD-10-CM | POA: Insufficient documentation

## 2022-06-03 DIAGNOSIS — R7301 Impaired fasting glucose: Secondary | ICD-10-CM | POA: Diagnosis not present

## 2022-06-03 DIAGNOSIS — R079 Chest pain, unspecified: Secondary | ICD-10-CM | POA: Diagnosis not present

## 2022-06-03 DIAGNOSIS — R0789 Other chest pain: Secondary | ICD-10-CM | POA: Diagnosis not present

## 2022-06-03 DIAGNOSIS — J449 Chronic obstructive pulmonary disease, unspecified: Secondary | ICD-10-CM

## 2022-06-03 DIAGNOSIS — E559 Vitamin D deficiency, unspecified: Secondary | ICD-10-CM | POA: Insufficient documentation

## 2022-06-03 DIAGNOSIS — R7303 Prediabetes: Secondary | ICD-10-CM | POA: Diagnosis not present

## 2022-06-03 DIAGNOSIS — I10 Essential (primary) hypertension: Secondary | ICD-10-CM | POA: Diagnosis not present

## 2022-06-03 LAB — BASIC METABOLIC PANEL
BUN: 17 (ref 4–21)
CO2: 31 — AB (ref 13–22)
Chloride: 101 (ref 99–108)
Creatinine: 0.8 (ref 0.5–1.1)
Potassium: 3.8 mEq/L (ref 3.5–5.1)
Sodium: 138 (ref 137–147)

## 2022-06-03 LAB — VITAMIN D 25 HYDROXY (VIT D DEFICIENCY, FRACTURES): Vit D, 25-Hydroxy: 61.2

## 2022-06-03 LAB — CBC AND DIFFERENTIAL
HCT: 45 (ref 36–46)
Hemoglobin: 15.6 (ref 12.0–16.0)
Platelets: 206 10*3/uL (ref 150–400)
WBC: 7.7

## 2022-06-03 LAB — HEPATIC FUNCTION PANEL
ALT: 21 U/L (ref 7–35)
AST: 39 — AB (ref 13–35)
Alkaline Phosphatase: 88 (ref 25–125)
Bilirubin, Total: 0.4

## 2022-06-03 LAB — VITAMIN B12: Vitamin B-12: 885

## 2022-06-03 LAB — CBC: RBC: 4.65 (ref 3.87–5.11)

## 2022-06-03 LAB — COMPREHENSIVE METABOLIC PANEL
Albumin: 4.4 (ref 3.5–5.0)
Calcium: 8.9 (ref 8.7–10.7)
EGFR: 60

## 2022-06-03 MED ORDER — ALBUTEROL SULFATE HFA 108 (90 BASE) MCG/ACT IN AERS: 2.0000 | INHALATION_SPRAY | Freq: Four times a day (QID) | RESPIRATORY_TRACT | 1 refills | Status: AC | PRN

## 2022-06-03 NOTE — Progress Notes (Signed)
Subjective:  Patient ID: Donna Little, female    DOB: 02-Sep-1945  Age: 77 y.o. MRN: [id number]  Chief Complaint  Patient presents with   Hypertension   Depression    HPI Patient is having severe depression. I changed her citalopram to cymbalta 60 mg daily. Patient is still very depressed. Much of her depression is related to her chronic back pain and her loss of independence.  Patient reports severe fatigue. Has some chest pain intermittently. It occurs with exertion. Her chest feels tight   Chronic back pain: seeing pain clinic.      06/03/2022   11:43 AM 04/22/2022   10:34 AM 03/18/2022   10:17 AM 12/11/2021   11:47 AM 05/05/2021    1:58 PM  Depression screen PHQ 2/9  Decreased Interest '3 3 2 '$ 0 0  Down, Depressed, Hopeless '3 3 2 2 '$ 0  PHQ - 2 Score '6 6 4 2 '$ 0  Altered sleeping '3 3 1 2   '$ Tired, decreased energy '3 3 1 2   '$ Change in appetite 3 3 0 1   Feeling bad or failure about yourself  0 3 0 2   Trouble concentrating 0 3 0 0   Moving slowly or fidgety/restless 0 3 0 0   Suicidal thoughts 0 0 0 0   PHQ-9 Score '15 24 6 9   '$ Difficult doing work/chores Very difficult Extremely dIfficult  Somewhat difficult        06/03/2022   11:43 AM 04/22/2022   10:34 AM 03/18/2022   10:17 AM 12/11/2021   11:47 AM 05/05/2021    1:58 PM  Depression screen PHQ 2/9  Decreased Interest '3 3 2 '$ 0 0  Down, Depressed, Hopeless '3 3 2 2 '$ 0  PHQ - 2 Score '6 6 4 2 '$ 0  Altered sleeping '3 3 1 2   '$ Tired, decreased energy '3 3 1 2   '$ Change in appetite 3 3 0 1   Feeling bad or failure about yourself  0 3 0 2   Trouble concentrating 0 3 0 0   Moving slowly or fidgety/restless 0 3 0 0   Suicidal thoughts 0 0 0 0   PHQ-9 Score '15 24 6 9   '$ Difficult doing work/chores Very difficult Extremely dIfficult  Somewhat difficult          03/05/2021    9:33 AM 04/09/2021   11:40 AM 05/05/2021    1:58 PM 06/11/2021    9:29 AM 04/22/2022   10:33 AM  Fall Risk  Falls in the past year? '1 1 1 1 1  '$ Was there an injury  with Fall? '1 1 1 1 1  '$ Fall Risk Category Calculator '3 3 3 3 3  '$ Fall Risk Category (Retired) [employer]   (RETIRED) Patient Fall Risk Level  High fall risk     Patient at Risk for Falls Due to  History of fall(s);Impaired balance/gait   History of fall(s)  Fall risk Follow up  Falls evaluation completed;Falls prevention discussed Falls evaluation completed Falls evaluation completed Falls evaluation completed      Review of Systems  Constitutional:  Positive for fatigue. Negative for chills and fever.  HENT:  Negative for congestion, rhinorrhea and sore throat.   Respiratory:  Positive for shortness of breath. Negative for cough.   Cardiovascular:  Positive for chest pain (has been happening twice daily, lasting 4-5 minutes.) and palpitations.  Gastrointestinal:  Negative for abdominal pain, constipation, diarrhea, nausea and vomiting.  Genitourinary:  Negative for dysuria, urgency and vaginal pain.  Musculoskeletal:  Positive for back pain. Negative for myalgias.  Neurological:  Positive for facial asymmetry and headaches. Negative for dizziness, weakness and light-headedness.  Psychiatric/Behavioral:  Positive for dysphoric mood. The patient is not nervous/anxious.     Current Outpatient Medications on File Prior to Visit  Medication Sig Dispense Refill   acetaminophen (TYLENOL) 325 MG tablet Take by mouth.     amitriptyline (ELAVIL) 10 MG tablet Take 1 tablet (10 mg total) by mouth at bedtime. 30 tablet 3   amLODipine (NORVASC) 5 MG tablet Take 1 tablet (5 mg total) by mouth daily. 30 tablet 11   atorvastatin (LIPITOR) 40 MG tablet Take by mouth.     cholecalciferol (VITAMIN D3) 25 MCG (1000 UNIT) tablet Take by mouth.     clopidogrel (PLAVIX) 75 MG tablet Take 1 tablet (75 mg total) by mouth daily. 90 tablet 1   denosumab (PROLIA) 60 MG/ML SOSY injection Inject into the skin.     DULoxetine (CYMBALTA) 60 MG capsule TAKE 1 CAPSULE BY MOUTH ONCE DAILY 30 capsule 1    fluocinonide (LIDEX) 0.05 % external solution Apply topically 2 (two) times daily.     furosemide (LASIX) 40 MG tablet Take 2 tablets (80 mg total) by mouth 2 (two) times daily. (Patient taking differently: Take 40 mg by mouth 2 (two) times daily.) 360 tablet 0   KLOR-CON M20 20 MEQ tablet TAKE 1 TABLET BY MOUTH TWICE DAILY WITH A MEAL (Patient taking differently: 20 mEq daily.) 180 tablet 0   levETIRAcetam (KEPPRA) 500 MG tablet Take 1 tablet (500 mg total) by mouth 2 (two) times daily. 60 tablet 6   lidocaine (LIDODERM) 5 % Place 3 patches onto the skin daily. Remove & Discard patch within 12 hours or as directed by MD 90 patch 3   meclizine (ANTIVERT) 25 MG tablet Take by mouth.     morphine (MS CONTIN) 15 MG 12 hr tablet Take 1 tablet (15 mg total) by mouth every 12 (twelve) hours. 60 tablet 0   naloxone (NARCAN) nasal spray 4 mg/0.1 mL 1 spray (4 mg) intranasally into 1 nostril. Use a new Narcan(R) nasal spray for subsequent doses and administer into alternating nostrils. May repeat every 2 to 3 minutes 2 each 1   nitroGLYCERIN (NITROSTAT) 0.4 MG SL tablet Place under the tongue.     pantoprazole (PROTONIX) 40 MG tablet Take 1 tablet (40 mg total) by mouth daily. 90 tablet 1   thiamine (VITAMIN B1) 100 MG tablet Take by mouth.     No current facility-administered medications on file prior to visit.   Past Medical History:  Diagnosis Date   A-fib [facility])    Atherosclerosis of both carotid arteries 2022   noted per CT on 05/14/20   Chronic pain syndrome    Chronic systolic heart failure (HCC)    Depression    Emphysema of lung ([place]) 2022   noted per CT scan on 05/14/20   Generalized atherosclerosis    GERD (gastroesophageal reflux disease)    Hypertension    IBS (irritable bowel syndrome)    Melanoma ([place])    Other specified nontoxic goiter    Palpitations    PMR (polymyalgia rheumatica) ([place]) 07/04/2020   Polymyalgia rheumatica ([place])    Primary pulmonary hypertension ([place])     Pulmonary embolism ([place])    Stroke ([place])    ministroke   Supraventricular tachycardia    Type 2 diabetes mellitus ([place])  Past Surgical History:  Procedure Laterality Date   ABDOMINAL HYSTERECTOMY     APPENDECTOMY     CATARACT EXTRACTION     CORONARY ANGIOPLASTY WITH STENT PLACEMENT     KYPHOPLASTY  05/2020   L4-L5    Family History  Problem Relation Age of Onset   Heart disease Mother    Diabetes Mother    Hyperlipidemia Mother    Hypertension Mother    Stroke Mother    Heart attack Mother    Heart disease Brother    Heart attack Brother    Stroke Brother    Hypertension Brother    Hyperlipidemia Brother    Social History   Socioeconomic History   Marital status: Single    Spouse name: Not on file   Number of children: 2   Years of education: Not on file   Highest education level: Not on file  Occupational History   Occupation: retired  Tobacco Use   Smoking status: Light Smoker    Packs/day: 0.20    Years: 50.00    Total pack years: 10.00    Types: Cigarettes   Smokeless tobacco: Never   Tobacco comments:    smokes 1-2 cigarettes per week.  Vaping Use   Vaping Use: Never used  Substance and Sexual Activity   Alcohol use: No    Alcohol/week: 0.0 standard drinks of alcohol   Drug use: No   Sexual activity: Not Currently  Other Topics Concern   Not on file  Social History Narrative   Not on file   Social Determinants of Health   Financial Resource Strain: Low Risk  (09/29/2021)   Overall Financial Resource Strain (CARDIA)    Difficulty of Paying Living Expenses: Not hard at all  Food Insecurity: No Food Insecurity (09/29/2019)   Hunger Vital Sign    Worried About Running Out of Food in the Last Year: Never true    Ran Out of Food in the Last Year: Never true  Transportation Needs: No Transportation Needs (09/29/2021)   PRAPARE - Hydrologist (Medical): No    Lack of Transportation (Non-Medical): No  Physical Activity:  Not on file  Stress: Not on file  Social Connections: Not on file    Objective:  BP 132/70   Pulse 80   Temp (!) 96.7 F (35.9 C)   Resp 18   Ht '4\' 11"'$  (1.499 m)   Wt 114 lb 6.4 oz (51.9 kg)   BMI 23.11 kg/m      06/03/2022   12:01 PM 06/03/2022   11:41 AM 04/22/2022   10:20 AM  BP/Weight  Systolic BP [id number]  Diastolic BP 70 70 60  Wt. (Lbs)  114.4 114  BMI  23.11 kg/m2 23.03 kg/m2    Physical Exam Vitals reviewed.  Constitutional:      Appearance: Normal appearance. She is normal weight.  Neck:     Vascular: No carotid bruit.  Cardiovascular:     Rate and Rhythm: Normal rate and regular rhythm.     Heart sounds: Normal heart sounds.     Comments: Nontender over chest.  Pulmonary:     Effort: Pulmonary effort is normal. No respiratory distress.     Breath sounds: Normal breath sounds.  Abdominal:     General: Abdomen is flat. Bowel sounds are normal.     Palpations: Abdomen is soft.     Tenderness: There is no abdominal tenderness.  Musculoskeletal:  General: Tenderness (lumbar) and deformity (flexion deformity of back) present.  Neurological:     Mental Status: She is alert and oriented to person, place, and time.  Psychiatric:        Mood and Affect: Mood normal.        Behavior: Behavior normal.     Diabetic Foot Exam - Simple   No data filed      Lab Results  Component Value Date   WBC 6.3 12/11/2021   HGB 15.0 12/11/2021   HCT 45.2 12/11/2021   PLT 188 12/11/2021   GLUCOSE 65 (L) 12/11/2021   CHOL 113 12/11/2021   TRIG 78 12/11/2021   HDL 40 12/11/2021   LDLCALC 57 12/11/2021   ALT 12 12/11/2021   AST 22 12/11/2021   NA 139 12/11/2021   K 4.7 12/11/2021   CL 98 12/11/2021   CREATININE 0.92 12/11/2021   BUN 18 12/11/2021   CO2 30 (H) 12/11/2021   TSH 0.714 12/11/2021   HGBA1C 6.0 (H) 12/11/2021   MICROALBUR 30 01/20/2020      Assessment & Plan:    Other chest pain Assessment & Plan: Muscular vs copd. EKG NSR. No ST  changes.  Orders: -     EKG 12-Lead  Prediabetes Assessment & Plan: Recommend continue to work on eating healthy diet and exercise.    Paresthesia Assessment & Plan: Check labs.   Vitamin D insufficiency Assessment & Plan: Check vitamin D level.   COPD mixed type (HCC) -     Albuterol Sulfate HFA; Inhale 2 puffs into the lungs every 6 (six) hours as needed.  Dispense: 18 g; Refill: 1     Meds ordered this encounter  Medications   albuterol (VENTOLIN HFA) 108 (90 Base) MCG/ACT inhaler    Sig: Inhale 2 puffs into the lungs every 6 (six) hours as needed.    Dispense:  18 g    Refill:  1    Orders Placed This Encounter  Procedures   EKG 12-Lead     Follow-up: Return in about 3 months (around 09/01/2022) for chronic fasting.   I,Carolyn M Morrison,acting as a Education administrator for [name], MD.,have documented all relevant documentation on the behalf of [name], MD,as directed by  [name], MD while in the presence of [name], MD.   An After Visit Summary was printed and given to the patient.  I attest that I have reviewed this visit and agree with the plan scribed by my staff.  Total time spent on today's visit was greater than 30 minutes, including both face-to-face time and nonface-to-face time personally spent on review of chart (labs and imaging), discussing labs and goals, discussing further work-up, treatment options, referrals to specialist if needed, reviewing outside records of pertinent, answering patient's questions, and coordinating care.  [name], MD [facility] [phone]

## 2022-06-03 NOTE — Assessment & Plan Note (Signed)
Recommend continue to work on eating healthy diet and exercise.  

## 2022-06-03 NOTE — Assessment & Plan Note (Signed)
Check vitamin D level 

## 2022-06-03 NOTE — Patient Instructions (Signed)
Increase duloxetine to 60 mg twice daily.

## 2022-06-03 NOTE — Assessment & Plan Note (Signed)
Muscular vs copd. EKG NSR. No ST changes.

## 2022-06-03 NOTE — Assessment & Plan Note (Signed)
>>  ASSESSMENT AND PLAN FOR PREDIABETES WRITTEN ON 06/03/2022 10:48 PM BY COX, KIRSTEN, MD  Recommend continue to work on eating healthy diet and exercise.

## 2022-06-03 NOTE — Assessment & Plan Note (Signed)
Check labs 

## 2022-06-04 LAB — LAB REPORT - SCANNED: A1c: 6

## 2022-06-09 ENCOUNTER — Other Ambulatory Visit: Payer: Self-pay | Admitting: Family Medicine

## 2022-06-10 ENCOUNTER — Other Ambulatory Visit: Payer: Self-pay

## 2022-06-10 DIAGNOSIS — R946 Abnormal results of thyroid function studies: Secondary | ICD-10-CM

## 2022-06-17 ENCOUNTER — Other Ambulatory Visit: Payer: Medicare Other

## 2022-06-17 DIAGNOSIS — J441 Chronic obstructive pulmonary disease with (acute) exacerbation: Secondary | ICD-10-CM | POA: Diagnosis not present

## 2022-06-18 ENCOUNTER — Encounter: Payer: Self-pay | Admitting: Family Medicine

## 2022-06-21 ENCOUNTER — Other Ambulatory Visit: Payer: Self-pay | Admitting: Family Medicine

## 2022-06-22 ENCOUNTER — Other Ambulatory Visit: Payer: Self-pay

## 2022-06-22 MED ORDER — MORPHINE SULFATE ER 15 MG PO TBCR: 15.0000 mg | EXTENDED_RELEASE_TABLET | Freq: Two times a day (BID) | ORAL | 0 refills | Status: DC

## 2022-06-25 ENCOUNTER — Encounter: Payer: Self-pay | Admitting: Family Medicine

## 2022-06-25 ENCOUNTER — Ambulatory Visit (INDEPENDENT_AMBULATORY_CARE_PROVIDER_SITE_OTHER): Payer: Medicare Other | Admitting: Family Medicine

## 2022-06-25 VITALS — BP 112/66 | HR 78 | Temp 96.8°F | Ht 59.0 in | Wt 108.0 lb

## 2022-06-25 DIAGNOSIS — J111 Influenza due to unidentified influenza virus with other respiratory manifestations: Secondary | ICD-10-CM

## 2022-06-25 NOTE — Progress Notes (Signed)
Acute Office Visit  Subjective:    Patient ID: Donna Little, female    DOB: [birth year], 77 y.o.   MRN: [id number]  Chief Complaint  Patient presents with   URI    HPI: Patient is in today for congestion, cough which has almost resolved, states her stomachs hurts "sore all over". Was seen at the UC last week and treated for the flu and gave her amoxicillin and prednisone. Denies shob, no headache, fever or chills.  Past Medical History:  Diagnosis Date   A-fib [facility])    Atherosclerosis of both carotid arteries 2022   noted per CT on 05/14/20   Chronic pain syndrome    Chronic systolic heart failure (HCC)    Depression    Emphysema of lung ([place]) 2022   noted per CT scan on 05/14/20   Generalized atherosclerosis    GERD (gastroesophageal reflux disease)    Hypertension    IBS (irritable bowel syndrome)    Melanoma (HCC)    Other specified nontoxic goiter    Palpitations    PMR (polymyalgia rheumatica) ([place]) 07/04/2020   Polymyalgia rheumatica ([place])    Primary pulmonary hypertension ([place])    Pulmonary embolism (HCC)    Stroke ([place])    ministroke   Supraventricular tachycardia    Type 2 diabetes mellitus ([place])     Past Surgical History:  Procedure Laterality Date   ABDOMINAL HYSTERECTOMY     APPENDECTOMY     CATARACT EXTRACTION     CORONARY ANGIOPLASTY WITH STENT PLACEMENT     KYPHOPLASTY  05/2020   L4-L5    Family History  Problem Relation Age of Onset   Heart disease Mother    Diabetes Mother    Hyperlipidemia Mother    Hypertension Mother    Stroke Mother    Heart attack Mother    Heart disease Brother    Heart attack Brother    Stroke Brother    Hypertension Brother    Hyperlipidemia Brother     Social History   Socioeconomic History   Marital status: Single    Spouse name: Not on file   Number of children: 2   Years of education: Not on file   Highest education level: Not on file  Occupational History   Occupation: retired  Tobacco Use    Smoking status: Light Smoker    Packs/day: 0.20    Years: 50.00    Additional pack years: 0.00    Total pack years: 10.00    Types: Cigarettes   Smokeless tobacco: Never   Tobacco comments:    smokes 1-2 cigarettes per week.  Vaping Use   Vaping Use: Never used  Substance and Sexual Activity   Alcohol use: No    Alcohol/week: 0.0 standard drinks of alcohol   Drug use: No   Sexual activity: Not Currently  Other Topics Concern   Not on file  Social History Narrative   Not on file   Social Determinants of Health   Financial Resource Strain: Low Risk  (09/29/2021)   Overall Financial Resource Strain (CARDIA)    Difficulty of Paying Living Expenses: Not hard at all  Food Insecurity: No Food Insecurity (09/29/2019)   Hunger Vital Sign    Worried About Running Out of Food in the Last Year: Never true    Ran Out of Food in the Last Year: Never true  Transportation Needs: No Transportation Needs (09/29/2021)   PRAPARE - Hydrologist (Medical):  No    Lack of Transportation (Non-Medical): No  Physical Activity: Not on file  Stress: Not on file  Social Connections: Not on file  Intimate Partner Violence: Not on file    Outpatient Medications Prior to Visit  Medication Sig Dispense Refill   acetaminophen (TYLENOL) 325 MG tablet Take by mouth.     albuterol (VENTOLIN HFA) 108 (90 Base) MCG/ACT inhaler Inhale 2 puffs into the lungs every 6 (six) hours as needed. 18 g 1   amitriptyline (ELAVIL) 10 MG tablet Take 1 tablet (10 mg total) by mouth at bedtime. 30 tablet 3   amLODipine (NORVASC) 5 MG tablet Take 1 tablet (5 mg total) by mouth daily. 30 tablet 11   atorvastatin (LIPITOR) 40 MG tablet Take by mouth.     cholecalciferol (VITAMIN D3) 25 MCG (1000 UNIT) tablet Take by mouth.     clopidogrel (PLAVIX) 75 MG tablet Take 1 tablet (75 mg total) by mouth daily. 90 tablet 1   denosumab (PROLIA) 60 MG/ML SOSY injection Inject into the skin.     DULoxetine  (CYMBALTA) 60 MG capsule Take 1 capsule by mouth once daily 90 capsule 0   fluocinonide (LIDEX) 0.05 % external solution Apply topically 2 (two) times daily.     furosemide (LASIX) 40 MG tablet Take 2 tablets (80 mg total) by mouth 2 (two) times daily. (Patient taking differently: Take 40 mg by mouth 2 (two) times daily.) 360 tablet 0   levETIRAcetam (KEPPRA) 500 MG tablet Take 1 tablet (500 mg total) by mouth 2 (two) times daily. 60 tablet 6   lidocaine (LIDODERM) 5 % Place 3 patches onto the skin daily. Remove & Discard patch within 12 hours or as directed by MD 90 patch 3   meclizine (ANTIVERT) 25 MG tablet Take by mouth.     morphine (MS CONTIN) 15 MG 12 hr tablet Take 1 tablet (15 mg total) by mouth every 12 (twelve) hours. 60 tablet 0   naloxone (NARCAN) nasal spray 4 mg/0.1 mL 1 spray (4 mg) intranasally into 1 nostril. Use a new Narcan(R) nasal spray for subsequent doses and administer into alternating nostrils. May repeat every 2 to 3 minutes 2 each 1   nitroGLYCERIN (NITROSTAT) 0.4 MG SL tablet Place under the tongue.     pantoprazole (PROTONIX) 40 MG tablet Take 1 tablet (40 mg total) by mouth daily. 90 tablet 1   potassium chloride SA (KLOR-CON M) 20 MEQ tablet TAKE 1 TABLET BY MOUTH TWICE DAILY WITH A MEAL 180 tablet 0   thiamine (VITAMIN B1) 100 MG tablet Take by mouth.     No facility-administered medications prior to visit.    Allergies  Allergen Reactions   Codeine    Macrobid [Nitrofurantoin] Nausea And Vomiting   Methocarbamol    Sertraline     Insomnia    Tizanidine     Review of Systems  Constitutional:  Negative for chills, fatigue and fever.  HENT:  Positive for congestion and postnasal drip. Negative for ear pain, rhinorrhea, sinus pressure, sinus pain and sore throat.   Respiratory:  Positive for cough. Negative for shortness of breath.   Cardiovascular:  Negative for chest pain.  Gastrointestinal:  Negative for diarrhea, nausea and vomiting.  Neurological:   Negative for dizziness and headaches.       Objective:        06/25/2022    3:16 PM 06/03/2022   12:01 PM 06/03/2022   11:41 AM  Vitals with BMI  Height  4\' 11"   4\' 11"   Weight 108 lbs  114 lbs 6 oz  BMI [id number]  123456  Systolic [id number]  Diastolic 66 70 70  Pulse 78  80    No data found.   Physical Exam Vitals reviewed.  Constitutional:      Appearance: Normal appearance.  HENT:     Right Ear: Tympanic membrane, ear canal and external ear normal.     Left Ear: Tympanic membrane, ear canal and external ear normal.     Nose: Congestion present.     Mouth/Throat:     Pharynx: Oropharynx is clear. No oropharyngeal exudate or posterior oropharyngeal erythema.  Cardiovascular:     Rate and Rhythm: Normal rate and regular rhythm.     Heart sounds: Normal heart sounds. No murmur heard. Pulmonary:     Effort: Pulmonary effort is normal. No respiratory distress.     Breath sounds: Normal breath sounds.  Abdominal:     Palpations: Abdomen is soft.     Tenderness: There is no abdominal tenderness.  Neurological:     Mental Status: She is alert and oriented to person, place, and time.  Psychiatric:        Mood and Affect: Mood normal.        Behavior: Behavior normal.     Health Maintenance Due  Topic Date Due   Diabetic kidney evaluation - Urine ACR  Never done   Hepatitis C Screening  Never done   OPHTHALMOLOGY EXAM  01/14/2021   FOOT EXAM  01/15/2021   Medicare Annual Wellness (AWV)  04/09/2022    There are no preventive care reminders to display for this patient.   Lab Results  Component Value Date   TSH 0.714 12/11/2021   Lab Results  Component Value Date   WBC 7.7 06/03/2022   HGB 15.6 06/03/2022   HCT 45 06/03/2022   MCV 99 (H) 12/11/2021   PLT 206 06/03/2022   Lab Results  Component Value Date   NA 138 06/03/2022   K 3.8 06/03/2022   CO2 31 (A) 06/03/2022   GLUCOSE 65 (L) 12/11/2021   BUN 17 06/03/2022   CREATININE 0.8 06/03/2022   BILITOT 0.3  12/11/2021   ALKPHOS 88 06/03/2022   AST 39 (A) 06/03/2022   ALT 21 06/03/2022   PROT 6.7 12/11/2021   ALBUMIN 4.4 06/03/2022   CALCIUM 8.9 06/03/2022   EGFR 60.0 06/03/2022   Lab Results  Component Value Date   CHOL 113 12/11/2021   Lab Results  Component Value Date   HDL 40 12/11/2021   Lab Results  Component Value Date   LDLCALC 57 12/11/2021   Lab Results  Component Value Date   TRIG 78 12/11/2021   Lab Results  Component Value Date   CHOLHDL 2.8 12/11/2021   Lab Results  Component Value Date   HGBA1C 6.0 (H) 12/11/2021       Assessment & Plan:  URI due to influenza Assessment & Plan: Improving.  Complete meds.        Follow-up: Return if symptoms worsen or fail to improve.  An After Visit Summary was printed and given to the patient.  [name], MD [facility] [phone]

## 2022-06-28 ENCOUNTER — Encounter: Payer: Self-pay | Admitting: Family Medicine

## 2022-06-28 DIAGNOSIS — J111 Influenza due to unidentified influenza virus with other respiratory manifestations: Secondary | ICD-10-CM | POA: Insufficient documentation

## 2022-06-28 NOTE — Assessment & Plan Note (Signed)
Improving.  Complete meds.

## 2022-07-19 ENCOUNTER — Other Ambulatory Visit: Payer: Self-pay | Admitting: Family Medicine

## 2022-07-21 ENCOUNTER — Other Ambulatory Visit: Payer: Self-pay

## 2022-07-21 MED ORDER — MORPHINE SULFATE ER 15 MG PO TBCR: 15.0000 mg | EXTENDED_RELEASE_TABLET | Freq: Two times a day (BID) | ORAL | 0 refills | Status: DC

## 2022-07-29 ENCOUNTER — Other Ambulatory Visit: Payer: Self-pay

## 2022-07-29 DIAGNOSIS — H524 Presbyopia: Secondary | ICD-10-CM | POA: Diagnosis not present

## 2022-07-29 DIAGNOSIS — H40013 Open angle with borderline findings, low risk, bilateral: Secondary | ICD-10-CM | POA: Diagnosis not present

## 2022-07-29 MED ORDER — MECLIZINE HCL 25 MG PO TABS: 25.0000 mg | ORAL_TABLET | Freq: Three times a day (TID) | ORAL | 2 refills | Status: DC | PRN

## 2022-08-03 ENCOUNTER — Other Ambulatory Visit: Payer: Self-pay | Admitting: Internal Medicine

## 2022-08-10 ENCOUNTER — Other Ambulatory Visit: Payer: Self-pay

## 2022-08-10 MED ORDER — FUROSEMIDE 40 MG PO TABS: 80.0000 mg | ORAL_TABLET | Freq: Two times a day (BID) | ORAL | 0 refills | Status: DC

## 2022-08-16 ENCOUNTER — Other Ambulatory Visit: Payer: Self-pay | Admitting: Internal Medicine

## 2022-08-24 ENCOUNTER — Telehealth: Payer: Self-pay

## 2022-08-24 NOTE — Telephone Encounter (Signed)
Patient called stating that she was needing a refill on her Morphine but states that the hospital changed her to the 15 mg of the morphine when she was originally taking the 30 mg. She states that the 15 mg of morphine is not doing the job and wants to go back to the 30 mg and wants it sent to [facility]. Please advise.

## 2022-08-25 NOTE — Telephone Encounter (Signed)
Patient stated originally you had her on 30 mg twice a day, but when she got discharge from the hospital they changed it to 15 mg twice a day but the 15 mg is not helping with her pain like the 30 mg were. Also stated that she has not been taking the cymbalta due to her itching, she had some of the other medication she was taking for anxiety left and she started taking that to help with her anxiety but did not know the name of medication. She stated she will try taking the Cymbalta again until she see you if she has too.

## 2022-08-25 NOTE — Telephone Encounter (Signed)
Patient made aware, verbalized understanding

## 2022-08-26 ENCOUNTER — Telehealth: Payer: Self-pay

## 2022-08-26 ENCOUNTER — Other Ambulatory Visit: Payer: Self-pay | Admitting: Family Medicine

## 2022-08-26 MED ORDER — MORPHINE SULFATE ER 15 MG PO TBCR: 15.0000 mg | EXTENDED_RELEASE_TABLET | Freq: Three times a day (TID) | ORAL | 0 refills | Status: DC

## 2022-08-26 NOTE — Telephone Encounter (Signed)
Donna Little continues to have pain,  Dr. Sedalia Muta is going to increase morphine 15 mg to three times daily.

## 2022-09-03 ENCOUNTER — Telehealth: Payer: Self-pay

## 2022-09-03 NOTE — Progress Notes (Signed)
Patient due for next Prolia injection 10/03/2022, benefit verification sent through [employer] Portal. Awaiting benefit summary.    Billee Cashing, CMA Clinical Pharmacist Assistant [phone]

## 2022-09-16 ENCOUNTER — Ambulatory Visit: Payer: Medicare Other | Admitting: Family Medicine

## 2022-09-21 ENCOUNTER — Telehealth: Payer: Self-pay

## 2022-09-21 NOTE — Telephone Encounter (Signed)
   Patient: Donna Little  DOB: [birth year]  MRN: [id number]  Prolia benefits verified by [employer] on: 09/03/22 Coverage Available: Buy & Bill Prior authorization NOT REQUIRED Deductible: $240 of $240 met OOP Cost: $0.00 Admin Fee: $0.00 Benefit Details: Prolia and administration will be subject to a $240.00 deductible ($240.00 met) and 20.0% coinsurance.   For the Secondary MD Purchase access option, patient has met their Medicare Deductible and the Standard Supplemental Plan they purchased covers their entire Medicare Co-Insurance Amount.  Patient agreed to proceed with ordering and will be scheduled once it has arrived in the office.  Jacklynn Bue, LPN

## 2022-10-05 ENCOUNTER — Other Ambulatory Visit: Payer: Self-pay

## 2022-10-05 MED ORDER — MORPHINE SULFATE ER 15 MG PO TBCR: 15.0000 mg | EXTENDED_RELEASE_TABLET | Freq: Three times a day (TID) | ORAL | 0 refills | Status: DC

## 2022-10-05 MED ORDER — GABAPENTIN 800 MG PO TABS: 800.0000 mg | ORAL_TABLET | Freq: Three times a day (TID) | ORAL | 1 refills | Status: DC

## 2022-10-06 NOTE — Progress Notes (Unsigned)
Subjective:  Patient ID: Donna Little, female    DOB: [birth year]  Age: 77 y.o. MRN: [id number]  No chief complaint on file.   HPI   GERD: Protonix 40 mg daily  Anxiety: Taking cymbalta 60 mg daily??  HTN: Norvasc 5 mg daily  Cholesterol: Lipitor 40 mg daily  Migraines: Elavil 10 mg   Chronic Back pain: Morphine 15 mg q8h, Gabapentin 800 mg TID     06/03/2022   11:43 AM 04/22/2022   10:34 AM 03/18/2022   10:17 AM 12/11/2021   11:47 AM 05/05/2021    1:58 PM  Depression screen PHQ 2/9  Decreased Interest 3 3 2  0 0  Down, Depressed, Hopeless 3 3 2 2  0  PHQ - 2 Score 6 6 4 2  0  Altered sleeping 3 3 1 2    Tired, decreased energy 3 3 1 2    Change in appetite 3 3 0 1   Feeling bad or failure about yourself  0 3 0 2   Trouble concentrating 0 3 0 0   Moving slowly or fidgety/restless 0 3 0 0   Suicidal thoughts 0 0 0 0   PHQ-9 Score 15 24 6 9    Difficult doing work/chores Very difficult Extremely dIfficult  Somewhat difficult         04/22/2022   10:33 AM  Fall Risk   Falls in the past year? 1  Number falls in past yr: 1  Injury with Fall? 1  Risk for fall due to : History of fall(s)  Follow up Falls evaluation completed    Patient Care Team: Blane Ohara, MD as PCP - General (Family Medicine) Beverely Pace, Osborn Coho, MD as Referring Physician (Cardiology) Webb Silversmith, MD (Gastroenterology) Drema Dallas, DO as Consulting Physician (Neurology) Zenovia Jordan, MD as Consulting Physician (Rheumatology) Leone Payor., MD (Surgery) Powers, Patrick North, MD as Referring Physician (Neurosurgery) Prescilla Sours, FNP as Nurse Practitioner (Urology) Zettie Pho,  [facility] (Inactive) (Pharmacist)   Review of Systems  Constitutional:  Negative for chills, fatigue and fever.  HENT:  Negative for congestion, ear pain, rhinorrhea and sore throat.   Respiratory:  Negative for cough and shortness of breath.   Cardiovascular:  Negative for chest pain.  Gastrointestinal:   Negative for abdominal pain, constipation, diarrhea, nausea and vomiting.  Genitourinary:  Negative for dysuria and urgency.  Musculoskeletal:  Negative for back pain and myalgias.  Neurological:  Negative for dizziness, weakness, light-headedness and headaches.  Psychiatric/Behavioral:  Negative for dysphoric mood. The patient is not nervous/anxious.     Current Outpatient Medications on File Prior to Visit  Medication Sig Dispense Refill   acetaminophen (TYLENOL) 325 MG tablet Take by mouth.     albuterol (VENTOLIN HFA) 108 (90 Base) MCG/ACT inhaler Inhale 2 puffs into the lungs every 6 (six) hours as needed. 18 g 1   amitriptyline (ELAVIL) 10 MG tablet Take 1 tablet (10 mg total) by mouth at bedtime. 30 tablet 3   amLODipine (NORVASC) 5 MG tablet Take 1 tablet (5 mg total) by mouth daily. 30 tablet 11   atorvastatin (LIPITOR) 40 MG tablet Take by mouth.     cholecalciferol (VITAMIN D3) 25 MCG (1000 UNIT) tablet Take by mouth.     clopidogrel (PLAVIX) 75 MG tablet Take 1 tablet (75 mg total) by mouth daily. 90 tablet 1   denosumab (PROLIA) 60 MG/ML SOSY injection Inject into the skin.     DULoxetine (CYMBALTA) 60 MG capsule Take 1 capsule by  mouth once daily 90 capsule 0   fluocinonide (LIDEX) 0.05 % external solution Apply topically 2 (two) times daily.     furosemide (LASIX) 40 MG tablet Take 2 tablets (80 mg total) by mouth 2 (two) times daily. 360 tablet 0   gabapentin (NEURONTIN) 800 MG tablet Take 1 tablet (800 mg total) by mouth 3 (three) times daily. 270 tablet 1   levETIRAcetam (KEPPRA) 500 MG tablet Take 1 tablet (500 mg total) by mouth 2 (two) times daily. 60 tablet 6   lidocaine (LIDODERM) 5 % Place 3 patches onto the skin daily. Remove & Discard patch within 12 hours or as directed by MD 90 patch 3   meclizine (ANTIVERT) 25 MG tablet Take 1 tablet (25 mg total) by mouth every 8 (eight) hours as needed for dizziness. 30 tablet 2   morphine (MS CONTIN) 15 MG 12 hr tablet Take 1  tablet (15 mg total) by mouth every 8 (eight) hours. 90 tablet 0   naloxone (NARCAN) nasal spray 4 mg/0.1 mL 1 spray (4 mg) intranasally into 1 nostril. Use a new Narcan(R) nasal spray for subsequent doses and administer into alternating nostrils. May repeat every 2 to 3 minutes 2 each 1   nitroGLYCERIN (NITROSTAT) 0.4 MG SL tablet Place under the tongue.     pantoprazole (PROTONIX) 40 MG tablet Take 1 tablet by mouth once daily 90 tablet 3   potassium chloride SA (KLOR-CON M) 20 MEQ tablet TAKE 1 TABLET BY MOUTH TWICE DAILY WITH A MEAL 180 tablet 0   thiamine (VITAMIN B1) 100 MG tablet Take by mouth.     No current facility-administered medications on file prior to visit.   Past Medical History:  Diagnosis Date   A-fib [facility])    Atherosclerosis of both carotid arteries 2022   noted per CT on 05/14/20   Chronic pain syndrome    Chronic systolic heart failure (HCC)    Depression    Emphysema of lung (HCC) 2022   noted per CT scan on 05/14/20   Generalized atherosclerosis    GERD (gastroesophageal reflux disease)    Hypertension    IBS (irritable bowel syndrome)    Melanoma (HCC)    Other specified nontoxic goiter    Palpitations    PMR (polymyalgia rheumatica) (HCC) 07/04/2020   Polymyalgia rheumatica (HCC)    Primary pulmonary hypertension (HCC)    Pulmonary embolism (HCC)    Stroke (HCC)    ministroke   Supraventricular tachycardia    Type 2 diabetes mellitus (HCC)    Past Surgical History:  Procedure Laterality Date   ABDOMINAL HYSTERECTOMY     APPENDECTOMY     CATARACT EXTRACTION     CORONARY ANGIOPLASTY WITH STENT PLACEMENT     KYPHOPLASTY  05/2020   L4-L5    Family History  Problem Relation Age of Onset   Heart disease Mother    Diabetes Mother    Hyperlipidemia Mother    Hypertension Mother    Stroke Mother    Heart attack Mother    Heart disease Brother    Heart attack Brother    Stroke Brother    Hypertension Brother    Hyperlipidemia Brother    Social  History   Socioeconomic History   Marital status: Single    Spouse name: Not on file   Number of children: 2   Years of education: Not on file   Highest education level: Not on file  Occupational History   Occupation: retired  Tobacco  Use   Smoking status: Light Smoker    Packs/day: 0.20    Years: 50.00    Additional pack years: 0.00    Total pack years: 10.00    Types: Cigarettes   Smokeless tobacco: Never   Tobacco comments:    smokes 1-2 cigarettes per week.  Vaping Use   Vaping Use: Never used  Substance and Sexual Activity   Alcohol use: No    Alcohol/week: 0.0 standard drinks of alcohol   Drug use: No   Sexual activity: Not Currently  Other Topics Concern   Not on file  Social History Narrative   Not on file   Social Determinants of Health   Financial Resource Strain: Low Risk  (09/29/2021)   Overall Financial Resource Strain (CARDIA)    Difficulty of Paying Living Expenses: Not hard at all  Food Insecurity: No Food Insecurity (09/29/2019)   Hunger Vital Sign    Worried About Running Out of Food in the Last Year: Never true    Ran Out of Food in the Last Year: Never true  Transportation Needs: No Transportation Needs (09/29/2021)   PRAPARE - Administrator, Civil Service (Medical): No    Lack of Transportation (Non-Medical): No  Physical Activity: Not on file  Stress: Not on file  Social Connections: Not on file    Objective:  There were no vitals taken for this visit.     06/25/2022    3:16 PM 06/03/2022   12:01 PM 06/03/2022   11:41 AM  BP/Weight  Systolic BP 112 132 154  Diastolic BP 66 70 70  Wt. (Lbs) 108  114.4  BMI 21.81 kg/m2  23.11 kg/m2    Physical Exam Vitals reviewed.  Constitutional:      Appearance: Normal appearance. She is normal weight.  Neck:     Vascular: No carotid bruit.  Cardiovascular:     Rate and Rhythm: Normal rate and regular rhythm.     Heart sounds: Normal heart sounds.  Pulmonary:     Effort: Pulmonary  effort is normal. No respiratory distress.     Breath sounds: Normal breath sounds.  Abdominal:     General: Abdomen is flat. Bowel sounds are normal.     Palpations: Abdomen is soft.     Tenderness: There is no abdominal tenderness.  Neurological:     Mental Status: She is alert and oriented to person, place, and time.  Psychiatric:        Mood and Affect: Mood normal.        Behavior: Behavior normal.     Diabetic Foot Exam - Simple   No data filed      Lab Results  Component Value Date   WBC 7.7 06/03/2022   HGB 15.6 06/03/2022   HCT 45 06/03/2022   PLT 206 06/03/2022   GLUCOSE 65 (L) 12/11/2021   CHOL 113 12/11/2021   TRIG 78 12/11/2021   HDL 40 12/11/2021   LDLCALC 57 12/11/2021   ALT 21 06/03/2022   AST 39 (A) 06/03/2022   NA 138 06/03/2022   K 3.8 06/03/2022   CL 101 06/03/2022   CREATININE 0.8 06/03/2022   BUN 17 06/03/2022   CO2 31 (A) 06/03/2022   TSH 0.714 12/11/2021   HGBA1C 6.0 (H) 12/11/2021   MICROALBUR 30 01/20/2020      Assessment & Plan:    Prediabetes  Vitamin D insufficiency  COPD mixed type (HCC)  Moderately severe depression  Mixed hyperlipidemia  Combined  hyperlipidemia associated with type 2 diabetes mellitus (HCC)  Chronic midline thoracic back pain     No orders of the defined types were placed in this encounter.   No orders of the defined types were placed in this encounter.    Follow-up: No follow-ups on file.   I,Geanna Divirgilio A Lamekia Nolden,acting as a scribe for Blane Ohara, MD.,have documented all relevant documentation on the behalf of Blane Ohara, MD,as directed by  Blane Ohara, MD while in the presence of Blane Ohara, MD.   An After Visit Summary was printed and given to the patient.  Blane Ohara, MD [facility] [phone]

## 2022-10-06 NOTE — Assessment & Plan Note (Signed)
She has aortic atherosclerosis, she is on atorvastatin and her LDL in 9/23 was target control.  

## 2022-10-06 NOTE — Assessment & Plan Note (Addendum)
The current medical regimen is effective;  continue present plan and medications. Continue mscontin 15 mg q8h.  Continue gabapentin 800 mg three times a day.

## 2022-10-06 NOTE — Assessment & Plan Note (Addendum)
>>  ASSESSMENT AND PLAN FOR COMBINED HYPERLIPIDEMIA ASSOCIATED WITH TYPE 2 DIABETES MELLITUS (HCC) WRITTEN ON 10/06/2022  4:38 PM BY Gracen Ringwald A, CMA  Control: good A1C excellent. Continue to work on eating a healthy diet and exercise.  Labs drawn today.      >>ASSESSMENT AND PLAN FOR PREDIABETES WRITTEN ON 10/06/2022  4:39 PM BY Alixander Rallis A, CMA  Recommend continue to work on eating healthy diet and exercise.

## 2022-10-06 NOTE — Assessment & Plan Note (Signed)
Recommend continue to work on eating healthy diet and exercise.  

## 2022-10-07 ENCOUNTER — Other Ambulatory Visit: Payer: Self-pay | Admitting: Internal Medicine

## 2022-10-07 ENCOUNTER — Other Ambulatory Visit: Payer: Self-pay | Admitting: Family Medicine

## 2022-10-07 ENCOUNTER — Encounter: Payer: Medicare Other | Admitting: Family Medicine

## 2022-10-07 DIAGNOSIS — R7303 Prediabetes: Secondary | ICD-10-CM

## 2022-10-07 DIAGNOSIS — E1169 Type 2 diabetes mellitus with other specified complication: Secondary | ICD-10-CM

## 2022-10-07 DIAGNOSIS — G8929 Other chronic pain: Secondary | ICD-10-CM

## 2022-10-07 DIAGNOSIS — E559 Vitamin D deficiency, unspecified: Secondary | ICD-10-CM

## 2022-10-07 DIAGNOSIS — E782 Mixed hyperlipidemia: Secondary | ICD-10-CM

## 2022-10-07 DIAGNOSIS — J449 Chronic obstructive pulmonary disease, unspecified: Secondary | ICD-10-CM

## 2022-10-07 DIAGNOSIS — F32A Depression, unspecified: Secondary | ICD-10-CM

## 2022-10-11 ENCOUNTER — Other Ambulatory Visit: Payer: Self-pay

## 2022-10-11 MED ORDER — CLOPIDOGREL BISULFATE 75 MG PO TABS: 75.0000 mg | ORAL_TABLET | Freq: Every day | ORAL | 0 refills | Status: DC

## 2022-10-11 NOTE — Progress Notes (Signed)
No show

## 2022-11-03 ENCOUNTER — Other Ambulatory Visit: Payer: Self-pay

## 2022-11-03 MED ORDER — MORPHINE SULFATE ER 15 MG PO TBCR: 15.0000 mg | EXTENDED_RELEASE_TABLET | Freq: Three times a day (TID) | ORAL | 0 refills | Status: DC

## 2022-11-06 ENCOUNTER — Telehealth: Payer: Self-pay | Admitting: Family Medicine

## 2022-11-06 NOTE — Telephone Encounter (Signed)
Prescription Request  11/06/2022  LOV: 06/25/2022  What is the name of the medication or equipment?  morphine (MS CONTIN) 15 MG 12 hr tablet    Which pharmacy would you like this sent to?  [facility] [address], [place] - [address] [place] [address] Phone: [phone] Fax: [phone]    Patient notified that their request is being sent to the clinical staff for review and that they should receive a response within 2 business days.   Please advise at Mobile [phone] (mobile)

## 2022-11-06 NOTE — Telephone Encounter (Signed)
Was sent in on 11/04/22 to [facility]

## 2022-11-24 ENCOUNTER — Other Ambulatory Visit: Payer: Self-pay

## 2022-11-24 MED ORDER — LEVETIRACETAM 500 MG PO TABS: 500.0000 mg | ORAL_TABLET | Freq: Two times a day (BID) | ORAL | 0 refills | Status: DC

## 2022-11-25 ENCOUNTER — Encounter: Payer: Self-pay | Admitting: Family Medicine

## 2022-11-25 ENCOUNTER — Ambulatory Visit (INDEPENDENT_AMBULATORY_CARE_PROVIDER_SITE_OTHER): Payer: Medicare Other | Admitting: Family Medicine

## 2022-11-25 VITALS — BP 124/62 | HR 91 | Temp 97.0°F | Ht 63.0 in | Wt 119.0 lb

## 2022-11-25 DIAGNOSIS — K219 Gastro-esophageal reflux disease without esophagitis: Secondary | ICD-10-CM

## 2022-11-25 DIAGNOSIS — I110 Hypertensive heart disease with heart failure: Secondary | ICD-10-CM | POA: Diagnosis not present

## 2022-11-25 DIAGNOSIS — F32A Depression, unspecified: Secondary | ICD-10-CM

## 2022-11-25 DIAGNOSIS — E782 Mixed hyperlipidemia: Secondary | ICD-10-CM

## 2022-11-25 DIAGNOSIS — J449 Chronic obstructive pulmonary disease, unspecified: Secondary | ICD-10-CM

## 2022-11-25 DIAGNOSIS — F331 Major depressive disorder, recurrent, moderate: Secondary | ICD-10-CM

## 2022-11-25 DIAGNOSIS — M810 Age-related osteoporosis without current pathological fracture: Secondary | ICD-10-CM | POA: Diagnosis not present

## 2022-11-25 DIAGNOSIS — R7303 Prediabetes: Secondary | ICD-10-CM

## 2022-11-25 MED ORDER — ATORVASTATIN CALCIUM 40 MG PO TABS: 40.0000 mg | ORAL_TABLET | Freq: Every day | ORAL | 1 refills | Status: DC

## 2022-11-25 MED ORDER — FUROSEMIDE 40 MG PO TABS
80.0000 mg | ORAL_TABLET | Freq: Two times a day (BID) | ORAL | 1 refills | Status: DC
Start: 2022-11-25 — End: 2023-08-02

## 2022-11-25 MED ORDER — AMLODIPINE BESYLATE 5 MG PO TABS
5.0000 mg | ORAL_TABLET | Freq: Every day | ORAL | 1 refills | Status: DC
Start: 2022-11-25 — End: 2023-05-21

## 2022-11-25 NOTE — Progress Notes (Unsigned)
Subjective:  Patient ID: Donna Little, female    DOB: Oct 05, 1945  Age: 77 y.o. MRN: [id number]  Chief Complaint  Patient presents with   Medical Management of Chronic Issues    HPI Hyperlipidemia:  Patient is currently taking atorvastatin 40 mg 1 tablet daily (out of one week ago), plavix 75 mg , lasix 80 mg twice daily.   Depression:  Patient is currently taking Cymbalta 60 mg take 1 tablet daily.   GERD: Patient is currently taking Pantoprazole 40 mg take 1 tablet daily.   Chronic Pain Syndrome: Patient has chronic pain related to her entire back.  4/10. Patient has osteoporosis and has had compression fractures.  She is on gabapentin 800 mg 3 times daily, lidoderm patches, and MS Contin 15 mg 3 times daily.  Patient is not candidate for surgery. Has narcan.   HTN: Norvasc 5 mg daily and lasix 40 mg 2 tablets twice daily.      11/25/2022    2:09 PM 06/03/2022   11:43 AM 04/22/2022   10:34 AM 03/18/2022   10:17 AM 12/11/2021   11:47 AM  Depression screen PHQ 2/9  Decreased Interest 2 3 3 2  0  Down, Depressed, Hopeless 2 3 3 2 2   PHQ - 2 Score 4 6 6 4 2   Altered sleeping 2 3 3 1 2   Tired, decreased energy 2 3 3 1 2   Change in appetite 2 3 3  0 1  Feeling bad or failure about yourself  0 0 3 0 2  Trouble concentrating 0 0 3 0 0  Moving slowly or fidgety/restless 2 0 3 0 0  Suicidal thoughts 0 0 0 0 0  PHQ-9 Score 12 15 24 6 9   Difficult doing work/chores Somewhat difficult Very difficult Extremely dIfficult  Somewhat difficult        11/25/2022    2:09 PM  Fall Risk   Falls in the past year? 1  Number falls in past yr: 1  Injury with Fall? 1  Risk for fall due to : Impaired balance/gait;History of fall(s)  Follow up Falls evaluation completed;Education provided;Falls prevention discussed    Patient Care Team: Blane Ohara, MD as PCP - General (Family Medicine) Beverely Pace, Osborn Coho, MD as Referring Physician (Cardiology) Webb Silversmith, MD  (Gastroenterology) Drema Dallas, DO as Consulting Physician (Neurology) Zenovia Jordan, MD as Consulting Physician (Rheumatology) Leone Payor., MD (Surgery) Powers, Patrick North, MD as Referring Physician (Neurosurgery) Prescilla Sours, FNP as Nurse Practitioner (Urology) Zettie Pho, [facility] (Inactive) (Pharmacist)   Review of Systems  Constitutional:  Negative for chills, fatigue and fever.  HENT:  Negative for congestion, ear pain, rhinorrhea and sore throat.   Respiratory:  Negative for cough and shortness of breath.   Cardiovascular:  Negative for chest pain.  Gastrointestinal:  Negative for abdominal pain, constipation, diarrhea, nausea and vomiting.  Genitourinary:  Negative for dysuria and urgency.  Musculoskeletal:  Positive for arthralgias and back pain. Negative for myalgias.  Neurological:  Negative for dizziness, weakness, light-headedness and headaches.  Psychiatric/Behavioral:  Negative for dysphoric mood. The patient is not nervous/anxious.     Current Outpatient Medications on File Prior to Visit  Medication Sig Dispense Refill   acetaminophen (TYLENOL) 325 MG tablet Take by mouth.     albuterol (VENTOLIN HFA) 108 (90 Base) MCG/ACT inhaler Inhale 2 puffs into the lungs every 6 (six) hours as needed. 18 g 1   amitriptyline (ELAVIL) 10 MG tablet Take 1 tablet (10 mg  total) by mouth at bedtime. 30 tablet 3   cholecalciferol (VITAMIN D3) 25 MCG (1000 UNIT) tablet Take by mouth.     clopidogrel (PLAVIX) 75 MG tablet Take 1 tablet (75 mg total) by mouth daily. 90 tablet 0   denosumab (PROLIA) 60 MG/ML SOSY injection Inject into the skin.     dicyclomine (BENTYL) 20 MG tablet TAKE 1 TABLET BY MOUTH 4 TIMES DAILY BEFORE MEAL(S) AND AT BEDTIME 360 tablet 0   DULoxetine (CYMBALTA) 60 MG capsule Take 1 capsule by mouth once daily 90 capsule 0   fluocinonide (LIDEX) 0.05 % external solution Apply topically 2 (two) times daily.     gabapentin (NEURONTIN) 800 MG tablet Take 1  tablet (800 mg total) by mouth 3 (three) times daily. 270 tablet 1   levETIRAcetam (KEPPRA) 500 MG tablet Take 1 tablet (500 mg total) by mouth 2 (two) times daily. 60 tablet 0   lidocaine (LIDODERM) 5 % Place 3 patches onto the skin daily. Remove & Discard patch within 12 hours or as directed by MD 90 patch 3   meclizine (ANTIVERT) 25 MG tablet Take 1 tablet (25 mg total) by mouth every 8 (eight) hours as needed for dizziness. 30 tablet 2   morphine (MS CONTIN) 15 MG 12 hr tablet Take 1 tablet (15 mg total) by mouth every 8 (eight) hours. 90 tablet 0   naloxone (NARCAN) nasal spray 4 mg/0.1 mL 1 spray (4 mg) intranasally into 1 nostril. Use a new Narcan(R) nasal spray for subsequent doses and administer into alternating nostrils. May repeat every 2 to 3 minutes 2 each 1   nitroGLYCERIN (NITROSTAT) 0.4 MG SL tablet Place under the tongue.     pantoprazole (PROTONIX) 40 MG tablet Take 1 tablet by mouth once daily 90 tablet 3   potassium chloride SA (KLOR-CON M) 20 MEQ tablet TAKE 1 TABLET BY MOUTH TWICE DAILY WITH A MEAL 180 tablet 0   thiamine (VITAMIN B1) 100 MG tablet Take by mouth.     No current facility-administered medications on file prior to visit.   Past Medical History:  Diagnosis Date   A-fib [facility])    Atherosclerosis of both carotid arteries 2022   noted per CT on 05/14/20   Chronic pain syndrome    Chronic systolic heart failure (HCC)    Depression    Emphysema of lung (HCC) 2022   noted per CT scan on 05/14/20   Generalized atherosclerosis    GERD (gastroesophageal reflux disease)    Hypertension    IBS (irritable bowel syndrome)    Melanoma (HCC)    Other specified nontoxic goiter    Palpitations    PMR (polymyalgia rheumatica) (HCC) 07/04/2020   Polymyalgia rheumatica (HCC)    Primary pulmonary hypertension (HCC)    Pulmonary embolism (HCC)    Stroke (HCC)    ministroke   Supraventricular tachycardia    Type 2 diabetes mellitus (HCC)    Past Surgical History:   Procedure Laterality Date   ABDOMINAL HYSTERECTOMY     APPENDECTOMY     CATARACT EXTRACTION     CORONARY ANGIOPLASTY WITH STENT PLACEMENT     KYPHOPLASTY  05/2020   L4-L5    Family History  Problem Relation Age of Onset   Heart disease Mother    Diabetes Mother    Hyperlipidemia Mother    Hypertension Mother    Stroke Mother    Heart attack Mother    Heart disease Brother    Heart attack Brother  Stroke Brother    Hypertension Brother    Hyperlipidemia Brother    Social History   Socioeconomic History   Marital status: Single    Spouse name: Not on file   Number of children: 2   Years of education: Not on file   Highest education level: Not on file  Occupational History   Occupation: retired  Tobacco Use   Smoking status: Light Smoker    Current packs/day: 0.20    Average packs/day: 0.2 packs/day for 50.0 years (10.0 ttl pk-yrs)    Types: Cigarettes   Smokeless tobacco: Never   Tobacco comments:    smokes 1-2 cigarettes per week.  Vaping Use   Vaping status: Never Used  Substance and Sexual Activity   Alcohol use: No    Alcohol/week: 0.0 standard drinks of alcohol   Drug use: No   Sexual activity: Not Currently  Other Topics Concern   Not on file  Social History Narrative   Not on file   Social Determinants of Health   Financial Resource Strain: Low Risk  (03/16/2022)   Received from [facility], [facility]   Overall Financial Resource Strain (CARDIA)    Difficulty of Paying Living Expenses: Not very hard  Food Insecurity: No Food Insecurity (03/16/2022)   Received from [facility], [facility]   Hunger Vital Sign    Worried About Running Out of Food in the Last Year: Never true    Ran Out of Food in the Last Year: Never true  Transportation Needs: No Transportation Needs (01/16/2022)   Received from [employer], [facility]   PRAPARE - Transportation    Lack of Transportation (Medical): No    Lack of Transportation (Non-Medical): No   Physical Activity: Inactive (03/16/2022)   Received from [facility], [facility]   Exercise Vital Sign    Days of Exercise per Week: 0 days    Minutes of Exercise per Session: 0 min  Stress: Stress Concern Present (03/16/2022)   Received from [facility], [facility] of Occupational [facility] - Occupational Stress Questionnaire    Feeling of Stress : To some extent  Social Connections: Socially Integrated (03/16/2022)   Received from [facility], [facility]   Social Network    How would you rate your social network (family, work, friends)?: Good participation with social networks    Objective:  BP 124/62   Pulse 91   Temp (!) 97 F (36.1 C)   Ht 5\' 3"  (1.6 m)   Wt 119 lb (54 kg)   SpO2 92%   BMI 21.08 kg/m      11/25/2022    2:01 PM 06/25/2022    3:16 PM 06/03/2022   12:01 PM  BP/Weight  Systolic BP 124 112 132  Diastolic BP 62 66 70  Wt. (Lbs) 119 108   BMI 21.08 kg/m2 21.81 kg/m2     Physical Exam Vitals reviewed.  Constitutional:      Appearance: Normal appearance. She is normal weight.  Neck:     Vascular: No carotid bruit.  Cardiovascular:     Rate and Rhythm: Normal rate and regular rhythm.     Heart sounds: Normal heart sounds.  Pulmonary:     Effort: Pulmonary effort is normal. No respiratory distress.     Breath sounds: Normal breath sounds.  Abdominal:     General: Abdomen is flat. Bowel sounds are normal.     Palpations: Abdomen is soft.     Tenderness:  There is no abdominal tenderness.  Neurological:     Mental Status: She is alert and oriented to person, place, and time.     Gait: Gait abnormal (stooped over.).  Psychiatric:        Mood and Affect: Mood normal.        Behavior: Behavior normal.     Diabetic Foot Exam - Simple   Simple Foot Form  11/25/2022  2:58 PM  Visual Inspection No deformities, no ulcerations, no other skin breakdown bilaterally: Yes Sensation Testing Intact to touch and monofilament  testing bilaterally: Yes Pulse Check Posterior Tibialis and Dorsalis pulse intact bilaterally: Yes Comments      Lab Results  Component Value Date   WBC 7.2 11/25/2022   HGB 14.4 11/25/2022   HCT 42.6 11/25/2022   PLT 239 11/25/2022   GLUCOSE 85 11/25/2022   CHOL 195 11/25/2022   TRIG 105 11/25/2022   HDL 48 11/25/2022   LDLCALC 128 (H) 11/25/2022   ALT 9 11/25/2022   AST 26 11/25/2022   NA 139 11/25/2022   K 4.1 11/25/2022   CL 91 (L) 11/25/2022   CREATININE 1.00 11/25/2022   BUN 18 11/25/2022   CO2 30 (H) 11/25/2022   TSH 1.020 11/25/2022   HGBA1C 6.1 (H) 11/25/2022   MICROALBUR 30 01/20/2020      Assessment & Plan:    Prediabetes Assessment & Plan: Recommend continue to work on eating healthy diet and exercise.   Orders: -     Hemoglobin A1c  COPD mixed type (HCC) Assessment & Plan: Use albuterol four times a day as needed.    Moderately severe depression Assessment & Plan: The current medical regimen is effective;  continue present plan and medications.   Taking Cymbalta 60 mg take 1 tablet daily.   Orders: -     TSH  Osteoporosis, post-menopausal Assessment & Plan: Recommend calcium with D.  continue prolia every 6 months.     Mixed hyperlipidemia Assessment & Plan: Well controlled.  No changes to medicines. taking atorvastatin 40 mg 1 tablet daily  Continue to work on eating a healthy diet and exercise.  Labs drawn today.    Orders: -     Comprehensive metabolic panel -     Lipid panel -     TSH  Hypertensive heart disease with heart failure (HCC) Assessment & Plan: Well controlled.  No changes to medicines.  Amlodipine 5 mg daily Continue to work on eating a healthy diet and exercise.  Labs drawn today.    Orders: -     CBC with Differential/Platelet -     TSH  Gastroesophageal reflux disease without esophagitis Assessment & Plan: Continue pantoprazole 40 mg daily.    Other orders -     amLODIPine Besylate; Take 1  tablet (5 mg total) by mouth daily.  Dispense: 90 tablet; Refill: 1 -     Furosemide; Take 2 tablets (80 mg total) by mouth 2 (two) times daily.  Dispense: 360 tablet; Refill: 1 -     [facility] CKD [facility]     Meds ordered this encounter  Medications   amLODipine (NORVASC) 5 MG tablet    Sig: Take 1 tablet (5 mg total) by mouth daily.    Dispense:  90 tablet    Refill:  1   DISCONTD: atorvastatin (LIPITOR) 40 MG tablet    Sig: Take 1 tablet (40 mg total) by mouth daily.    Dispense:  90 tablet    Refill:  1   furosemide (LASIX) 40 MG tablet    Sig: Take 2 tablets (80 mg total) by mouth 2 (two) times daily.    Dispense:  360 tablet    Refill:  1    Orders Placed This Encounter  Procedures   CBC with Differential/Platelet   Comprehensive metabolic panel   Lipid panel   TSH   Hemoglobin A1c   [facility] CKD Program     Follow-up: Return in about 3 months (around 02/25/2023) for chronic follow up.   I,Katherina A Bramblett,acting as a scribe for Blane Ohara, MD.,have documented all relevant documentation on the behalf of Blane Ohara, MD,as directed by  Blane Ohara, MD while in the presence of Blane Ohara, MD.   Clayborn Bigness I Leal-Borjas,acting as a scribe for Blane Ohara, MD.,have documented all relevant documentation on the behalf of Blane Ohara, MD,as directed by  Blane Ohara, MD while in the presence of Blane Ohara, MD.    An After Visit Summary was printed and given to the patient.  Blane Ohara, MD [facility] [phone]

## 2022-11-26 ENCOUNTER — Other Ambulatory Visit: Payer: Self-pay

## 2022-11-26 LAB — LIPID PANEL
Chol/HDL Ratio: 4.1 ratio (ref 0.0–4.4)
Cholesterol, Total: 195 mg/dL (ref 100–199)
HDL: 48 mg/dL (ref >39)
LDL Chol Calc (NIH): 128 mg/dL — ABNORMAL HIGH (ref 0–99)
Triglycerides: 105 mg/dL (ref 0–149)
VLDL Cholesterol Cal: 19 mg/dL (ref 5–40)

## 2022-11-26 LAB — COMPREHENSIVE METABOLIC PANEL
ALT: 9 IU/L (ref 0–32)
AST: 26 IU/L (ref 0–40)
Albumin: 4.2 g/dL (ref 3.8–4.8)
Alkaline Phosphatase: 107 IU/L (ref 44–121)
BUN/Creatinine Ratio: 18 (ref 12–28)
BUN: 18 mg/dL (ref 8–27)
Bilirubin Total: 0.3 mg/dL (ref 0.0–1.2)
CO2: 30 mmol/L — ABNORMAL HIGH (ref 20–29)
Calcium: 10.1 mg/dL (ref 8.7–10.3)
Chloride: 91 mmol/L — ABNORMAL LOW (ref 96–106)
Creatinine, Ser: 1 mg/dL (ref 0.57–1.00)
Globulin, Total: 2.7 g/dL (ref 1.5–4.5)
Glucose: 85 mg/dL (ref 70–99)
Potassium: 4.1 mmol/L (ref 3.5–5.2)
Sodium: 139 mmol/L (ref 134–144)
Total Protein: 6.9 g/dL (ref 6.0–8.5)
eGFR: 57 mL/min/{1.73_m2} — ABNORMAL LOW (ref >59)

## 2022-11-26 LAB — CBC WITH DIFFERENTIAL/PLATELET
Basophils Absolute: 0.1 10*3/uL (ref 0.0–0.2)
Basos: 1 %
EOS (ABSOLUTE): 0.6 10*3/uL — ABNORMAL HIGH (ref 0.0–0.4)
Eos: 8 %
Hematocrit: 42.6 % (ref 34.0–46.6)
Hemoglobin: 14.4 g/dL (ref 11.1–15.9)
Immature Grans (Abs): 0 10*3/uL (ref 0.0–0.1)
Immature Granulocytes: 0 %
Lymphocytes Absolute: 2.2 10*3/uL (ref 0.7–3.1)
Lymphs: 30 %
MCH: 32.1 pg (ref 26.6–33.0)
MCHC: 33.8 g/dL (ref 31.5–35.7)
MCV: 95 fL (ref 79–97)
Monocytes Absolute: 0.8 10*3/uL (ref 0.1–0.9)
Monocytes: 11 %
Neutrophils Absolute: 3.6 10*3/uL (ref 1.4–7.0)
Neutrophils: 50 %
Platelets: 239 10*3/uL (ref 150–450)
RBC: 4.49 x10E6/uL (ref 3.77–5.28)
RDW: 12.5 % (ref 11.7–15.4)
WBC: 7.2 10*3/uL (ref 3.4–10.8)

## 2022-11-26 LAB — HEMOGLOBIN A1C
Est. average glucose Bld gHb Est-mCnc: 128 mg/dL
Hgb A1c MFr Bld: 6.1 % — ABNORMAL HIGH (ref 4.8–5.6)

## 2022-11-26 LAB — LITHOLINK CKD PROGRAM

## 2022-11-26 LAB — TSH: TSH: 1.02 u[IU]/mL (ref 0.450–4.500)

## 2022-11-26 MED ORDER — ATORVASTATIN CALCIUM 80 MG PO TABS: 80.0000 mg | ORAL_TABLET | Freq: Every day | ORAL | 1 refills | Status: DC

## 2022-11-28 NOTE — Assessment & Plan Note (Signed)
Recommend continue to work on eating healthy diet and exercise.  

## 2022-11-28 NOTE — Assessment & Plan Note (Signed)
Use albuterol four times a day as needed.

## 2022-11-28 NOTE — Assessment & Plan Note (Signed)
Recommend calcium with D.  continue prolia every 6 months.

## 2022-11-28 NOTE — Assessment & Plan Note (Signed)
Continue pantoprazole 40 mg daily.  ?

## 2022-11-28 NOTE — Assessment & Plan Note (Signed)
The current medical regimen is effective;  continue present plan and medications.   Taking Cymbalta 60 mg take 1 tablet daily.

## 2022-11-28 NOTE — Assessment & Plan Note (Signed)
Well controlled.  No changes to medicines. taking atorvastatin 40 mg 1 tablet daily  Continue to work on eating a healthy diet and exercise.  Labs drawn today.

## 2022-11-28 NOTE — Assessment & Plan Note (Signed)
>>  ASSESSMENT AND PLAN FOR PREDIABETES WRITTEN ON 11/28/2022  1:28 PM BY LEAL-BORJAS, Rajean Desantiago I, CMA  Recommend continue to work on eating healthy diet and exercise.

## 2022-11-28 NOTE — Assessment & Plan Note (Signed)
Well controlled.  No changes to medicines. Amlodipine 5 mg daily. Continue to work on eating a healthy diet and exercise.  Labs drawn today.  

## 2022-12-02 ENCOUNTER — Ambulatory Visit (INDEPENDENT_AMBULATORY_CARE_PROVIDER_SITE_OTHER): Payer: Medicare Other | Admitting: Physician Assistant

## 2022-12-02 ENCOUNTER — Encounter: Payer: Self-pay | Admitting: Physician Assistant

## 2022-12-02 VITALS — BP 122/60 | HR 85 | Temp 97.3°F | Ht 63.0 in | Wt 120.6 lb

## 2022-12-02 DIAGNOSIS — R109 Unspecified abdominal pain: Secondary | ICD-10-CM | POA: Diagnosis not present

## 2022-12-02 DIAGNOSIS — Z9181 History of falling: Secondary | ICD-10-CM | POA: Diagnosis not present

## 2022-12-02 DIAGNOSIS — M2550 Pain in unspecified joint: Secondary | ICD-10-CM | POA: Diagnosis not present

## 2022-12-02 DIAGNOSIS — S62602A Fracture of unspecified phalanx of right middle finger, initial encounter for closed fracture: Secondary | ICD-10-CM | POA: Insufficient documentation

## 2022-12-02 LAB — POCT URINALYSIS DIP (CLINITEK)
Bilirubin, UA: NEGATIVE
Blood, UA: NEGATIVE
Glucose, UA: NEGATIVE mg/dL
Ketones, POC UA: NEGATIVE mg/dL
Leukocytes, UA: NEGATIVE
Nitrite, UA: NEGATIVE
POC PROTEIN,UA: NEGATIVE
Spec Grav, UA: 1.01 (ref 1.010–1.025)
Urobilinogen, UA: 0.2 E.U./dL
pH, UA: 7 (ref 5.0–8.0)

## 2022-12-02 NOTE — Assessment & Plan Note (Signed)
UA was negative Continue to monitor symptoms Can use Ice/heat to help with musculoskeletal pain

## 2022-12-02 NOTE — Progress Notes (Signed)
Acute Office Visit  Subjective:    Patient ID: Donna Little, female    DOB: Aug 28, 1945, 77 y.o.   MRN: [id number]  Chief Complaint  Patient presents with   Hand Pain    Right    HPI: Patient is in today for right hand pain after she fell going forward over her right hand from recliner 6 weeks ago. She noticed sharp pain going on the 4th finger and throbbing pain also on the 3rd finger. Her grab is weak and she is afraid to drop things. She has been taking tylenol and it is not helping. Patient has been trying her best to keep it moving, but is still unable to due to pain. Patient states she was told she has osteoporosis in her back, but states she has had multiple surgeries on her back which has caused her to be in a forward flexed position when standing. Patient denies having injured this hand before.  Patient also admitted to flank pain that started today. Denies any history of dysuria, polyuria,   Past Medical History:  Diagnosis Date   A-fib (HCC)    Atherosclerosis of both carotid arteries 2022   noted per CT on 05/14/20   Chronic pain syndrome    Chronic systolic heart failure (HCC)    Depression    Emphysema of lung (HCC) 2022   noted per CT scan on 05/14/20   Generalized atherosclerosis    GERD (gastroesophageal reflux disease)    Hypertension    IBS (irritable bowel syndrome)    Melanoma (HCC)    Other specified nontoxic goiter    Palpitations    PMR (polymyalgia rheumatica) (HCC) 07/04/2020   Polymyalgia rheumatica (HCC)    Primary pulmonary hypertension (HCC)    Pulmonary embolism (HCC)    Stroke (HCC)    ministroke   Supraventricular tachycardia    Type 2 diabetes mellitus (HCC)     Past Surgical History:  Procedure Laterality Date   ABDOMINAL HYSTERECTOMY     APPENDECTOMY     CATARACT EXTRACTION     CORONARY ANGIOPLASTY WITH STENT PLACEMENT     KYPHOPLASTY  05/2020   L4-L5    Family History  Problem Relation Age of Onset   Heart disease Mother     Diabetes Mother    Hyperlipidemia Mother    Hypertension Mother    Stroke Mother    Heart attack Mother    Heart disease Brother    Heart attack Brother    Stroke Brother    Hypertension Brother    Hyperlipidemia Brother     Social History   Socioeconomic History   Marital status: Single    Spouse name: Not on file   Number of children: 2   Years of education: Not on file   Highest education level: Not on file  Occupational History   Occupation: retired  Tobacco Use   Smoking status: Light Smoker    Current packs/day: 0.50    Average packs/day: 0.4 packs/day for 100.0 years (35.0 ttl pk-yrs)    Types: Cigarettes   Smokeless tobacco: Never   Tobacco comments:    smokes 1-2 cigarettes per week.  Vaping Use   Vaping status: Never Used  Substance and Sexual Activity   Alcohol use: No    Alcohol/week: 0.0 standard drinks of alcohol   Drug use: No   Sexual activity: Not Currently  Other Topics Concern   Not on file  Social History Narrative   Not on file  Social Determinants of Health   Financial Resource Strain: Low Risk  (03/16/2022)   Received from [facility], [facility]   Overall Financial Resource Strain (CARDIA)    Difficulty of Paying Living Expenses: Not very hard  Food Insecurity: No Food Insecurity (03/16/2022)   Received from [facility], [facility]   Hunger Vital Sign    Worried About Running Out of Food in the Last Year: Never true    Ran Out of Food in the Last Year: Never true  Transportation Needs: No Transportation Needs (01/16/2022)   Received from [facility], [facility]   PRAPARE - Transportation    Lack of Transportation (Medical): No    Lack of Transportation (Non-Medical): No  Physical Activity: Inactive (03/16/2022)   Received from [facility], [facility]   Exercise Vital Sign    Days of Exercise per Week: 0 days    Minutes of Exercise per Session: 0 min  Stress: Stress Concern Present (03/16/2022)   Received  from [facility], [facility] - Occupational Stress Questionnaire    Feeling of Stress : To some extent  Social Connections: Socially Integrated (03/16/2022)   Received from [facility], [facility]   Social Network    How would you rate your social network (family, work, friends)?: Good participation with social networks  Intimate Partner Violence: Not At Risk (03/16/2022)   Received from [facility], [facility]   HITS    Over the last 12 months how often did your partner physically hurt you?: 1    Over the last 12 months how often did your partner insult you or talk down to you?: 1    Over the last 12 months how often did your partner threaten you with physical harm?: 1    Over the last 12 months how often did your partner scream or curse at you?: 1    Outpatient Medications Prior to Visit  Medication Sig Dispense Refill   acetaminophen (TYLENOL) 325 MG tablet Take by mouth.     albuterol (VENTOLIN HFA) 108 (90 Base) MCG/ACT inhaler Inhale 2 puffs into the lungs every 6 (six) hours as needed. 18 g 1   amLODipine (NORVASC) 5 MG tablet Take 1 tablet (5 mg total) by mouth daily. 90 tablet 1   atorvastatin (LIPITOR) 80 MG tablet Take 1 tablet (80 mg total) by mouth daily. 90 tablet 1   clopidogrel (PLAVIX) 75 MG tablet Take 1 tablet (75 mg total) by mouth daily. 90 tablet 0   denosumab (PROLIA) 60 MG/ML SOSY injection Inject into the skin.     dicyclomine (BENTYL) 20 MG tablet TAKE 1 TABLET BY MOUTH 4 TIMES DAILY BEFORE MEAL(S) AND AT BEDTIME 360 tablet 0   DULoxetine (CYMBALTA) 60 MG capsule Take 1 capsule by mouth once daily 90 capsule 0   furosemide (LASIX) 40 MG tablet Take 2 tablets (80 mg total) by mouth 2 (two) times daily. 360 tablet 1   gabapentin (NEURONTIN) 800 MG tablet Take 1 tablet (800 mg total) by mouth 3 (three) times daily. 270 tablet 1   levETIRAcetam (KEPPRA) 500 MG tablet Take 1 tablet (500 mg total) by mouth 2  (two) times daily. 60 tablet 0   meclizine (ANTIVERT) 25 MG tablet Take 1 tablet (25 mg total) by mouth every 8 (eight) hours as needed for dizziness. 30 tablet 2   morphine (MS CONTIN) 15 MG 12 hr tablet Take 1 tablet (15 mg total) by mouth  every 8 (eight) hours. 90 tablet 0   naloxone (NARCAN) nasal spray 4 mg/0.1 mL 1 spray (4 mg) intranasally into 1 nostril. Use a new Narcan(R) nasal spray for subsequent doses and administer into alternating nostrils. May repeat every 2 to 3 minutes 2 each 1   nitroGLYCERIN (NITROSTAT) 0.4 MG SL tablet Place under the tongue.     pantoprazole (PROTONIX) 40 MG tablet Take 1 tablet by mouth once daily 90 tablet 3   potassium chloride SA (KLOR-CON M) 20 MEQ tablet TAKE 1 TABLET BY MOUTH TWICE DAILY WITH A MEAL 180 tablet 0   thiamine (VITAMIN B1) 100 MG tablet Take by mouth.     amitriptyline (ELAVIL) 10 MG tablet Take 1 tablet (10 mg total) by mouth at bedtime. 30 tablet 3   cholecalciferol (VITAMIN D3) 25 MCG (1000 UNIT) tablet Take by mouth.     fluocinonide (LIDEX) 0.05 % external solution Apply topically 2 (two) times daily.     lidocaine (LIDODERM) 5 % Place 3 patches onto the skin daily. Remove & Discard patch within 12 hours or as directed by MD 90 patch 3   No facility-administered medications prior to visit.    Allergies  Allergen Reactions   Codeine    Macrobid [Nitrofurantoin] Nausea And Vomiting   Methocarbamol    Sertraline     Insomnia    Tizanidine     Review of Systems  Constitutional:  Negative for chills, fatigue and fever.  HENT:  Negative for congestion, ear pain and sore throat.   Respiratory:  Negative for cough and shortness of breath.   Cardiovascular:  Negative for chest pain and palpitations.  Gastrointestinal:  Negative for abdominal pain, constipation, diarrhea, nausea and vomiting.  Endocrine: Negative for polydipsia, polyphagia and polyuria.  Genitourinary:  Negative for difficulty urinating and dysuria.   Musculoskeletal:  Positive for arthralgias (right hand pain). Negative for back pain and myalgias.  Skin:  Negative for rash.  Neurological:  Positive for dizziness. Negative for headaches.  Psychiatric/Behavioral:  Negative for dysphoric mood. The patient is not nervous/anxious.        Objective:        12/02/2022   10:50 AM 11/25/2022    2:01 PM 06/25/2022    3:16 PM  Vitals with BMI  Height 5\' 3"  5\' 3"  4\' 11"   Weight 120 lbs 10 oz 119 lbs 108 lbs  BMI 21.37 21.09 21.8  Systolic 122 124 536  Diastolic 60 62 66  Pulse 85 91 78    Orthostatic VS for the past 72 hrs (Last 3 readings):  Patient Position BP Location Cuff Size  12/02/22 1050 Sitting Left Arm Small     Physical Exam Vitals reviewed.  Constitutional:      Appearance: Normal appearance.  Cardiovascular:     Rate and Rhythm: Normal rate and regular rhythm.     Heart sounds: Normal heart sounds.  Pulmonary:     Effort: Pulmonary effort is normal.     Breath sounds: Wheezing present.  Abdominal:     General: Bowel sounds are normal.     Palpations: Abdomen is soft.     Tenderness: There is no abdominal tenderness.  Musculoskeletal:     Right hand: Swelling, tenderness and bony tenderness present. Decreased range of motion. Decreased strength of finger abduction and thumb/finger opposition. Normal strength of wrist extension.     Left hand: Normal.     Comments: Comprising the middle, and ring finger with edema located on the  MCP joint of the middle finger.  Inability to make closed fist.   Neurological:     Mental Status: She is alert and oriented to person, place, and time.  Psychiatric:        Mood and Affect: Mood normal.        Behavior: Behavior normal.     Health Maintenance Due  Topic Date Due   Diabetic kidney evaluation - Urine ACR  Never done   Hepatitis C Screening  Never done   OPHTHALMOLOGY EXAM  01/14/2021   FOOT EXAM  01/15/2021   Medicare Annual Wellness (AWV)  04/09/2022    INFLUENZA VACCINE  11/05/2022    There are no preventive care reminders to display for this patient.   Lab Results  Component Value Date   TSH 1.020 11/25/2022   Lab Results  Component Value Date   WBC 7.2 11/25/2022   HGB 14.4 11/25/2022   HCT 42.6 11/25/2022   MCV 95 11/25/2022   PLT 239 11/25/2022   Lab Results  Component Value Date   NA 139 11/25/2022   K 4.1 11/25/2022   CO2 30 (H) 11/25/2022   GLUCOSE 85 11/25/2022   BUN 18 11/25/2022   CREATININE 1.00 11/25/2022   BILITOT 0.3 11/25/2022   ALKPHOS 107 11/25/2022   AST 26 11/25/2022   ALT 9 11/25/2022   PROT 6.9 11/25/2022   ALBUMIN 4.2 11/25/2022   CALCIUM 10.1 11/25/2022   EGFR 57 (L) 11/25/2022   Lab Results  Component Value Date   CHOL 195 11/25/2022   Lab Results  Component Value Date   HDL 48 11/25/2022   Lab Results  Component Value Date   LDLCALC 128 (H) 11/25/2022   Lab Results  Component Value Date   TRIG 105 11/25/2022   Lab Results  Component Value Date   CHOLHDL 4.1 11/25/2022   Lab Results  Component Value Date   HGBA1C 6.1 (H) 11/25/2022       Assessment & Plan:  Fracture of unspecified phalanx of right middle finger, initial encounter for closed fracture Assessment & Plan: Sent for x-ray Will send referral to hand surgeon depending on results.   Orders: -     DG Hand Complete Right; Future  Flank pain Assessment & Plan: UA was negative Continue to monitor symptoms Can use Ice/heat to help with musculoskeletal pain  Orders: -     POCT URINALYSIS DIP (CLINITEK)     No orders of the defined types were placed in this encounter.   Orders Placed This Encounter  Procedures   DG Hand Complete Right   POCT URINALYSIS DIP (CLINITEK)     Follow-up: No follow-ups on file.  An After Visit Summary was printed and given to the patient.  Langley Gauss, [facility] [phone]

## 2022-12-02 NOTE — Assessment & Plan Note (Signed)
Sent for x-ray Will send referral to hand surgeon depending on results.

## 2022-12-04 DIAGNOSIS — I471 Supraventricular tachycardia, unspecified: Secondary | ICD-10-CM | POA: Diagnosis not present

## 2022-12-04 DIAGNOSIS — R0600 Dyspnea, unspecified: Secondary | ICD-10-CM | POA: Diagnosis not present

## 2022-12-04 DIAGNOSIS — I10 Essential (primary) hypertension: Secondary | ICD-10-CM | POA: Diagnosis not present

## 2022-12-08 ENCOUNTER — Telehealth: Payer: Self-pay

## 2022-12-08 NOTE — Telephone Encounter (Signed)
Patient called wanting results for x-ray of her hands that she got on Wednesday.  Made patient aware, the x-ray has not been read and the office will call as soon as we get results. [facility] is behind on their reading so it may take longer than normal.  Patient Made Aware, Verbalized Understanding.

## 2022-12-09 ENCOUNTER — Other Ambulatory Visit: Payer: Self-pay

## 2022-12-09 DIAGNOSIS — S62602A Fracture of unspecified phalanx of right middle finger, initial encounter for closed fracture: Secondary | ICD-10-CM

## 2022-12-10 ENCOUNTER — Other Ambulatory Visit: Payer: Self-pay | Admitting: Physician Assistant

## 2022-12-10 DIAGNOSIS — S62602A Fracture of unspecified phalanx of right middle finger, initial encounter for closed fracture: Secondary | ICD-10-CM

## 2022-12-10 NOTE — Telephone Encounter (Signed)
[facility] radiology and asked them to read patient x-ray it was done on 12/02/22. X-ray has been printed and given to [employer], PA-C

## 2022-12-10 NOTE — Telephone Encounter (Signed)
Patient made aware of Right Hand X-Ray results.

## 2022-12-11 ENCOUNTER — Other Ambulatory Visit: Payer: Self-pay

## 2022-12-11 MED ORDER — DULOXETINE HCL 60 MG PO CPEP: 60.0000 mg | ORAL_CAPSULE | Freq: Every day | ORAL | 0 refills | Status: DC

## 2022-12-11 MED ORDER — MORPHINE SULFATE ER 15 MG PO TBCR: 15.0000 mg | EXTENDED_RELEASE_TABLET | Freq: Three times a day (TID) | ORAL | 0 refills | Status: AC

## 2022-12-16 ENCOUNTER — Encounter: Payer: Self-pay | Admitting: Family Medicine

## 2022-12-16 ENCOUNTER — Ambulatory Visit (INDEPENDENT_AMBULATORY_CARE_PROVIDER_SITE_OTHER): Payer: Medicare Other | Admitting: Family Medicine

## 2022-12-16 VITALS — BP 116/70 | HR 85 | Temp 97.1°F | Ht 60.0 in | Wt 122.0 lb

## 2022-12-16 DIAGNOSIS — F17219 Nicotine dependence, cigarettes, with unspecified nicotine-induced disorders: Secondary | ICD-10-CM

## 2022-12-16 DIAGNOSIS — Z780 Asymptomatic menopausal state: Secondary | ICD-10-CM

## 2022-12-16 DIAGNOSIS — I471 Supraventricular tachycardia, unspecified: Secondary | ICD-10-CM | POA: Diagnosis not present

## 2022-12-16 DIAGNOSIS — Z0000 Encounter for general adult medical examination without abnormal findings: Secondary | ICD-10-CM | POA: Diagnosis not present

## 2022-12-16 DIAGNOSIS — Z23 Encounter for immunization: Secondary | ICD-10-CM | POA: Diagnosis not present

## 2022-12-16 DIAGNOSIS — Z13820 Encounter for screening for osteoporosis: Secondary | ICD-10-CM | POA: Insufficient documentation

## 2022-12-16 DIAGNOSIS — Z122 Encounter for screening for malignant neoplasm of respiratory organs: Secondary | ICD-10-CM | POA: Insufficient documentation

## 2022-12-16 DIAGNOSIS — I10 Essential (primary) hypertension: Secondary | ICD-10-CM | POA: Diagnosis not present

## 2022-12-16 DIAGNOSIS — R0600 Dyspnea, unspecified: Secondary | ICD-10-CM | POA: Diagnosis not present

## 2022-12-16 NOTE — Progress Notes (Unsigned)
Subjective:   Donna Little is a 77 y.o. female who presents for Medicare Annual (Subsequent) preventive examination.  Visit Complete: In person  Patient Medicare AWV questionnaire was completed by the patient; I have confirmed that all information answered by patient is correct and no changes since this date.  Review of Systems    Review of Systems  Constitutional:  Negative for chills, fever and malaise/fatigue.  HENT:  Negative for ear pain, sinus pain and sore throat.   Respiratory:  Negative for cough and shortness of breath.   Cardiovascular:  Negative for chest pain.  Musculoskeletal:  Negative for myalgias.  Neurological:  Positive for dizziness. Negative for headaches.   Physical Exam Vitals reviewed.  Constitutional:      Appearance: Normal appearance.  Neck:     Vascular: No carotid bruit.  Cardiovascular:     Rate and Rhythm: Normal rate and regular rhythm.     Pulses: Normal pulses.     Heart sounds: Normal heart sounds.  Pulmonary:     Effort: Pulmonary effort is normal.     Breath sounds: Normal breath sounds.  Abdominal:     General: Bowel sounds are normal.     Palpations: Abdomen is soft.     Tenderness: There is no abdominal tenderness.  Musculoskeletal:        General: Deformity (back) present.  Neurological:     Mental Status: She is alert and oriented to person, place, and time.  Psychiatric:        Mood and Affect: Mood normal.        Behavior: Behavior normal.     Cardiac Risk Factors include: advanced age (>109men, >18 women)     Objective:    Today's Vitals   12/16/22 1339 12/16/22 1344  BP: 116/70   Pulse: 85   Temp: (!) 97.1 F (36.2 C)   SpO2: 92%   Weight: 122 lb (55.3 kg)   Height: 5' (1.524 m)   PainSc:  8    Body mass index is 23.83 kg/m.     12/16/2022    1:42 PM 04/09/2021   11:39 AM 03/18/2020    2:07 PM 06/20/2019   11:38 AM  Advanced Directives  Does Patient Have a Medical Advance Directive? Yes Yes Yes Yes   Type of Estate agent of [place];Living will Healthcare Power of [place];Living will Healthcare Power of [place];Living will Healthcare Power of [place];Living will  Does patient want to make changes to medical advance directive?  Yes (MAU/Ambulatory/Procedural Areas - Information given)    Copy of Healthcare Power of Attorney in Chart? No - copy requested No - copy requested No - copy requested     Current Medications (verified) Outpatient Encounter Medications as of 12/16/2022  Medication Sig   acetaminophen (TYLENOL) 325 MG tablet Take by mouth.   albuterol (VENTOLIN HFA) 108 (90 Base) MCG/ACT inhaler Inhale 2 puffs into the lungs every 6 (six) hours as needed.   amLODipine (NORVASC) 5 MG tablet Take 1 tablet (5 mg total) by mouth daily.   atorvastatin (LIPITOR) 80 MG tablet Take 1 tablet (80 mg total) by mouth daily.   clopidogrel (PLAVIX) 75 MG tablet Take 1 tablet (75 mg total) by mouth daily.   denosumab (PROLIA) 60 MG/ML SOSY injection Inject into the skin.   dicyclomine (BENTYL) 20 MG tablet TAKE 1 TABLET BY MOUTH 4 TIMES DAILY BEFORE MEAL(S) AND AT BEDTIME   DULoxetine (CYMBALTA) 60 MG capsule Take 1 capsule (60 mg  total) by mouth daily.   furosemide (LASIX) 40 MG tablet Take 2 tablets (80 mg total) by mouth 2 (two) times daily.   gabapentin (NEURONTIN) 800 MG tablet Take 1 tablet (800 mg total) by mouth 3 (three) times daily.   meclizine (ANTIVERT) 25 MG tablet Take 1 tablet (25 mg total) by mouth every 8 (eight) hours as needed for dizziness.   morphine (MS CONTIN) 15 MG 12 hr tablet Take 1 tablet (15 mg total) by mouth every 8 (eight) hours.   naloxone (NARCAN) nasal spray 4 mg/0.1 mL 1 spray (4 mg) intranasally into 1 nostril. Use a new Narcan(R) nasal spray for subsequent doses and administer into alternating nostrils. May repeat every 2 to 3 minutes   nitroGLYCERIN (NITROSTAT) 0.4 MG SL tablet Place under the tongue.   pantoprazole (PROTONIX) 40 MG  tablet Take 1 tablet by mouth once daily   [DISCONTINUED] levETIRAcetam (KEPPRA) 500 MG tablet Take 1 tablet (500 mg total) by mouth 2 (two) times daily.   [DISCONTINUED] potassium chloride SA (KLOR-CON M) 20 MEQ tablet TAKE 1 TABLET BY MOUTH TWICE DAILY WITH A MEAL   No facility-administered encounter medications on file as of 12/16/2022.    Allergies (verified) Codeine, Macrobid [nitrofurantoin], Methocarbamol, Sertraline, and Tizanidine   History: Past Medical History:  Diagnosis Date   A-fib (HCC)    Atherosclerosis of both carotid arteries 2022   noted per CT on 05/14/20   Chronic pain syndrome    Chronic systolic heart failure (HCC)    Depression    Emphysema of lung (HCC) 2022   noted per CT scan on 05/14/20   Generalized atherosclerosis    GERD (gastroesophageal reflux disease)    Hypertension    IBS (irritable bowel syndrome)    Melanoma (HCC)    Other specified nontoxic goiter    Palpitations    PMR (polymyalgia rheumatica) (HCC) 07/04/2020   Polymyalgia rheumatica (HCC)    Primary pulmonary hypertension (HCC)    Pulmonary embolism (HCC)    Stroke (HCC)    ministroke   Supraventricular tachycardia    Type 2 diabetes mellitus (HCC)    Past Surgical History:  Procedure Laterality Date   ABDOMINAL HYSTERECTOMY     APPENDECTOMY     CATARACT EXTRACTION     CORONARY ANGIOPLASTY WITH STENT PLACEMENT     KYPHOPLASTY  05/2020   L4-L5   Family History  Problem Relation Age of Onset   Heart disease Mother    Diabetes Mother    Hyperlipidemia Mother    Hypertension Mother    Stroke Mother    Heart attack Mother    Heart disease Brother    Heart attack Brother    Stroke Brother    Hypertension Brother    Hyperlipidemia Brother    Social History   Socioeconomic History   Marital status: Single    Spouse name: Not on file   Number of children: 2   Years of education: Not on file   Highest education level: Not on file  Occupational History   Occupation:  retired  Tobacco Use   Smoking status: Light Smoker    Current packs/day: 0.50    Average packs/day: 0.4 packs/day for 100.0 years (35.0 ttl pk-yrs)    Types: Cigarettes   Smokeless tobacco: Never   Tobacco comments:    smokes 1-2 cigarettes per week.  Vaping Use   Vaping status: Never Used  Substance and Sexual Activity   Alcohol use: No    Alcohol/week:  0.0 standard drinks of alcohol   Drug use: No   Sexual activity: Not Currently  Other Topics Concern   Not on file  Social History Narrative   Not on file   Social Determinants of Health   Financial Resource Strain: Low Risk  (12/04/2022)   Received from [facility]   Overall Financial Resource Strain (CARDIA)    Difficulty of Paying Living Expenses: Not hard at all  Food Insecurity: No Food Insecurity (12/04/2022)   [facility]   Hunger Vital Sign    Worried About Running Out of Food in the Last Year: Never true    Ran Out of Food in the Last Year: Never true  Transportation Needs: No Transportation Needs (12/04/2022)   Received from [facility] - Transportation    Lack of Transportation (Medical): No    Lack of Transportation (Non-Medical): No  Physical Activity: Inactive (03/16/2022)   [facility]   Exercise Vital Sign    Days of Exercise per Week: 0 days    Minutes of Exercise per Session: 0 min  Stress: Stress Concern Present (03/16/2022)   Received from [facility] - Occupational Stress Questionnaire    Feeling of Stress : To some extent  Social Connections: Socially Integrated (03/16/2022)   [facility], [facility]   Social Network    How would you rate your social network (family, work, friends)?: Good participation with social networks    Tobacco Counseling Ready to quit: Not Answered Counseling given: Not Answered Tobacco comments: smokes 1-2 cigarettes per  week.   Clinical Intake:  Pre-visit preparation completed: No  Pain : 0-10 Pain Score: 8  Pain Type: Chronic pain Pain Location: Back Pain Orientation: Lower Pain Descriptors / Indicators: Constant, Aching, Burning, Sharp Pain Onset: More than a month ago Pain Frequency: Constant     Nutritional Status: BMI of 19-24  Normal Nutritional Risks: None Diabetes: No  How often do you need to have someone help you when you read instructions, pamphlets, or other written materials from your doctor or pharmacy?: 1 - Never  Interpreter Needed?: No      Activities of Daily Living    12/16/2022    1:42 PM  In your present state of health, do you have any difficulty performing the following activities:  Hearing? 0  Vision? 0  Difficulty concentrating or making decisions? 1  Walking or climbing stairs? 0  Dressing or bathing? 0  Doing errands, shopping? 1  Preparing Food and eating ? N  Using the Toilet? N  In the past six months, have you accidently leaked urine? N  Do you have problems with loss of bowel control? N  Managing your Medications? Y  Managing your Finances? Y  Housekeeping or managing your Housekeeping? Y    Patient Care Team: Blane Ohara, MD as PCP - General (Family Medicine) Beverely Pace, Osborn Coho, MD as Referring Physician (Cardiology) Webb Silversmith, MD (Gastroenterology) Drema Dallas, DO as Consulting Physician (Neurology) Zenovia Jordan, MD as Consulting Physician (Rheumatology) Leone Payor., MD (Surgery) Powers, Patrick North, MD as Referring Physician (Neurosurgery) Prescilla Sours, FNP as Nurse Practitioner (Urology) Zettie Pho, [facility] (Inactive) (Pharmacist)  Indicate any recent Medical Services you may have received from other than [facility] providers in the past year (date may be approximate).     Assessment:   This is a routine wellness examination for Abeni.  Encounter for Medicare annual wellness exam Assessment & Plan: Things to do to  keep yourself healthy  - Exercise at least 30-45 minutes a day, 3-4 days a week.  - Eat a low-fat diet with lots of fruits and vegetables, up to 7-9 servings per day.  - Seatbelts can save your life. Wear them always.  - Smoke detectors on every level of your home, check batteries every year.  - Eye Doctor - have an eye exam every 1-2 years  - Safe sex - if you may be exposed to STDs, use a condom.  - Alcohol -  If you drink, do it moderately, less than 2 drinks per day.  - Health Care Power of Attorney. Choose someone to speak for you if you are not able.  - Depression is common in our stressful world.If you're feeling down or losing interest in things you normally enjoy, please come in for a visit.  - Violence - If anyone is threatening or hurting you, please call immediately.    Encounter for osteoporosis screening in asymptomatic postmenopausal patient -     DG Bone Density; Future  Immunization due -     Flu Vaccine Trivalent High Dose (Fluad)    Hearing/Vision screen No results found.   Goals Addressed   None   Depression Screen    11/25/2022    2:09 PM 06/03/2022   11:43 AM 04/22/2022   10:34 AM 03/18/2022   10:17 AM 12/11/2021   11:47 AM 05/05/2021    1:58 PM 04/09/2021   11:40 AM  PHQ 2/9 Scores  PHQ - 2 Score 4 6 6 4 2  0 0  PHQ- 9 Score 12 15 24 6 9       Fall Risk    11/25/2022    2:09 PM 04/22/2022   10:33 AM 06/11/2021    9:29 AM 05/05/2021    1:58 PM 04/09/2021   11:40 AM  Fall Risk   Falls in the past year? 1 1 1 1 1   Number falls in past yr: 1 1 1 1 1   Injury with Fall? 1 1 1 1 1   Risk for fall due to : Impaired balance/gait;History of fall(s) History of fall(s)   History of fall(s);Impaired balance/gait  Follow up Falls evaluation completed;Education provided;Falls prevention discussed Falls evaluation completed Falls evaluation completed Falls evaluation completed Falls evaluation completed;Falls prevention discussed    TIMED UP AND GO:  Was the test  performed?  No    Cognitive Function:    09/21/2019    3:31 PM  MMSE - Mini Mental State Exam  Orientation to time 5  Orientation to Place 5  Registration 3  Attention/ Calculation 5  Recall 3  Language- name 2 objects 2  Language- repeat 1  Language- follow 3 step command 3  Language- read & follow direction 1  Write a sentence 1  Copy design 1  Total score 30        04/09/2021   11:41 AM 09/06/2020   11:04 AM 05/16/2020   10:38 AM  6CIT Screen  What Year? 0 points 0 points 0 points  What month? 0 points 0 points   What time? 0 points 0 points   Count back from 20 0 points 0 points   Months in reverse 0 points 0 points   Repeat phrase 2 points 2 points   Total Score 2 points 2 points     Immunizations Immunization History  Administered Date(s) Administered   Fluad Quad(high  Dose 65+) 12/20/2019, 01/02/2021, 12/30/2021   Influenza-Unspecified 12/19/2018   PFIZER(Purple Top)SARS-COV-2 Vaccination 04/22/2019, 05/13/2019, 06/04/2019, 12/28/2019   Pfizer Covid-19 Vaccine Bivalent Booster 72yrs & up 06/11/2021   Pneumococcal Conjugate-13 03/21/2014   Pneumococcal Polysaccharide-23 05/27/2018   Td 02/03/2021   Td,absorbed, Preservative Free, Adult Use, Lf Unspecified 02/03/2021   Tdap 02/15/2015   Zoster Recombinant(Shingrix) 12/16/2017, 05/27/2018   Zoster, Live 11/07/2012    TDAP status: Up to date  Flu Vaccine status: Up to date  Pneumococcal vaccine status: Up to date  Covid-19 vaccine status: Declined, Education has been provided regarding the importance of this vaccine but patient still declined. Advised may receive this vaccine at local pharmacy or Health Dept.or vaccine clinic. Aware to provide a copy of the vaccination record if obtained from local pharmacy or Health Dept. Verbalized acceptance and understanding.  Qualifies for Shingles Vaccine? No   Zostavax completed Yes   Shingrix Completed?: Yes  Screening Tests Health Maintenance  Topic Date Due    Diabetic kidney evaluation - Urine ACR  Never done   Hepatitis C Screening  Never done   Lung Cancer Screening  Never done   OPHTHALMOLOGY EXAM  01/14/2021   INFLUENZA VACCINE  11/05/2022   HEMOGLOBIN A1C  05/28/2023   Diabetic kidney evaluation - eGFR measurement  11/25/2023   FOOT EXAM  12/16/2023   Medicare Annual Wellness (AWV)  12/16/2023   DTaP/Tdap/Td (4 - Td or Tdap) 02/04/2031   Pneumonia Vaccine 97+ Years old  Completed   DEXA SCAN  Completed   Zoster Vaccines- Shingrix  Completed   HPV VACCINES  Aged Out   Colonoscopy  Discontinued   COVID-19 Vaccine  Discontinued    Health Maintenance  Health Maintenance Due  Topic Date Due   Diabetic kidney evaluation - Urine ACR  Never done   Hepatitis C Screening  Never done   Lung Cancer Screening  Never done   OPHTHALMOLOGY EXAM  01/14/2021   INFLUENZA VACCINE  11/05/2022    Colorectal cancer screening: No longer required.   Mammogram status: No longer required due to Patient decision.  DEXA: Ordered  Lung Cancer Screening: (Low Dose CT Chest recommended if Age 30-80 years, 20 pack-year currently smoking OR have quit w/in 15years.) does qualify.   Lung Cancer Screening Referral: yes  Additional Screening:  Vision Screening: Recommended annual ophthalmology exams for early detection of glaucoma and other disorders of the eye. Is the patient up to date with their annual eye exam? Yes Who is the provider or what is the name of the office in which the patient attends annual eye exams? Sees a doctor in [place] If pt is not established with a provider, would they like to be referred to a provider to establish care? No .   Dental Screening: Recommended annual dental exams for proper oral hygiene  Diabetic Foot exam performed.   Community Resource Referral / Chronic Care Management: CRR required this visit?  No   CCM required this visit?  No     Plan:     I have personally reviewed and noted the following in the  patient's chart:   Medical and social history Use of alcohol, tobacco or illicit drugs  Current medications and supplements including opioid prescriptions. Patient is currently taking opioid prescriptions. Information provided to patient regarding non-opioid alternatives. Patient advised to discuss non-opioid treatment plan with their provider. Functional ability and status Nutritional status Physical activity Advanced directives List of other physicians Hospitalizations, surgeries, and ER visits in previous  12 months Vitals Screenings to include cognitive, depression, and falls Referrals and appointments  In addition, I have reviewed and discussed with patient certain preventive protocols, quality metrics, and best practice recommendations. A written personalized care plan for preventive services as well as general preventive health recommendations were provided to patient.   Clayborn Bigness I Leal-Borjas,acting as a scribe for Blane Ohara, MD.,have documented all relevant documentation on the behalf of Blane Ohara, MD,as directed by  Blane Ohara, MD while in the presence of Blane Ohara, MD.   Dr. Sedalia Muta

## 2022-12-16 NOTE — Patient Instructions (Signed)
Things to do to keep yourself healthy  - Exercise at least 15 minutes a day, 3-4 days a week.  - Eat a low-fat diet with lots of fruits and vegetables, up to 7-9 servings per day.  - Seatbelts can save your life. Wear them always.  - Smoke detectors on every level of your home, check batteries every year.  - Recommend quit smoking.  - Eye Doctor - have an eye exam annually. - Health Care Power of Attorney. Choose someone to speak for you if you are not able.  - Depression is common in our stressful world.If you're feeling down or losing interest in things you normally enjoy, please come in for a visit.  - Violence - If anyone is threatening or hurting you, please call immediately.

## 2022-12-19 DIAGNOSIS — F17219 Nicotine dependence, cigarettes, with unspecified nicotine-induced disorders: Secondary | ICD-10-CM | POA: Insufficient documentation

## 2022-12-19 NOTE — Assessment & Plan Note (Signed)
Order CT chest low screening

## 2022-12-19 NOTE — Assessment & Plan Note (Signed)

## 2022-12-22 ENCOUNTER — Other Ambulatory Visit: Payer: Self-pay

## 2022-12-22 DIAGNOSIS — Z13820 Encounter for screening for osteoporosis: Secondary | ICD-10-CM

## 2022-12-23 ENCOUNTER — Other Ambulatory Visit: Payer: Self-pay | Admitting: Family Medicine

## 2022-12-23 ENCOUNTER — Telehealth: Payer: Self-pay | Admitting: Family Medicine

## 2022-12-23 MED ORDER — POTASSIUM CHLORIDE CRYS ER 20 MEQ PO TBCR: 20.0000 meq | EXTENDED_RELEASE_TABLET | Freq: Two times a day (BID) | ORAL | 0 refills | Status: DC

## 2022-12-23 NOTE — Telephone Encounter (Signed)
-----   Message from [facility] sent at 12/22/2022 11:09 AM EDT ----- Regarding: RE: lung ct Done ----- Message ----- From: Blane Ohara, MD Sent: 12/20/2022   9:57 PM EDT To: [place] Caller Subject: lung ct                                        Please call patient and explain I had to cancel her ct of lungs for lung cancer screening as medicare stops screening at age 77 yo and they will not pay for it. Dr. Sedalia Muta

## 2022-12-30 DIAGNOSIS — M79641 Pain in right hand: Secondary | ICD-10-CM | POA: Diagnosis not present

## 2023-01-07 DIAGNOSIS — M810 Age-related osteoporosis without current pathological fracture: Secondary | ICD-10-CM | POA: Diagnosis not present

## 2023-01-07 LAB — HM DEXA SCAN

## 2023-01-09 DIAGNOSIS — M353 Polymyalgia rheumatica: Secondary | ICD-10-CM | POA: Diagnosis not present

## 2023-01-09 DIAGNOSIS — Z955 Presence of coronary angioplasty implant and graft: Secondary | ICD-10-CM | POA: Diagnosis not present

## 2023-01-09 DIAGNOSIS — G9340 Encephalopathy, unspecified: Secondary | ICD-10-CM | POA: Diagnosis not present

## 2023-01-09 DIAGNOSIS — E7849 Other hyperlipidemia: Secondary | ICD-10-CM | POA: Diagnosis not present

## 2023-01-09 DIAGNOSIS — F17210 Nicotine dependence, cigarettes, uncomplicated: Secondary | ICD-10-CM | POA: Diagnosis not present

## 2023-01-09 DIAGNOSIS — Z86711 Personal history of pulmonary embolism: Secondary | ICD-10-CM | POA: Diagnosis not present

## 2023-01-09 DIAGNOSIS — R10819 Abdominal tenderness, unspecified site: Secondary | ICD-10-CM | POA: Diagnosis not present

## 2023-01-09 DIAGNOSIS — R9431 Abnormal electrocardiogram [ECG] [EKG]: Secondary | ICD-10-CM | POA: Diagnosis not present

## 2023-01-09 DIAGNOSIS — A045 Campylobacter enteritis: Secondary | ICD-10-CM | POA: Diagnosis not present

## 2023-01-09 DIAGNOSIS — K5730 Diverticulosis of large intestine without perforation or abscess without bleeding: Secondary | ICD-10-CM | POA: Diagnosis not present

## 2023-01-09 DIAGNOSIS — I700 Atherosclerosis of aorta: Secondary | ICD-10-CM | POA: Diagnosis not present

## 2023-01-09 DIAGNOSIS — Z91148 Patient's other noncompliance with medication regimen for other reason: Secondary | ICD-10-CM | POA: Diagnosis not present

## 2023-01-09 DIAGNOSIS — E1151 Type 2 diabetes mellitus with diabetic peripheral angiopathy without gangrene: Secondary | ICD-10-CM | POA: Diagnosis not present

## 2023-01-09 DIAGNOSIS — Z79899 Other long term (current) drug therapy: Secondary | ICD-10-CM | POA: Diagnosis not present

## 2023-01-09 DIAGNOSIS — R4182 Altered mental status, unspecified: Secondary | ICD-10-CM | POA: Diagnosis not present

## 2023-01-09 DIAGNOSIS — Z7409 Other reduced mobility: Secondary | ICD-10-CM | POA: Diagnosis not present

## 2023-01-09 DIAGNOSIS — G894 Chronic pain syndrome: Secondary | ICD-10-CM | POA: Diagnosis not present

## 2023-01-09 DIAGNOSIS — I5022 Chronic systolic (congestive) heart failure: Secondary | ICD-10-CM | POA: Diagnosis not present

## 2023-01-09 DIAGNOSIS — I4891 Unspecified atrial fibrillation: Secondary | ICD-10-CM | POA: Diagnosis not present

## 2023-01-09 DIAGNOSIS — R1084 Generalized abdominal pain: Secondary | ICD-10-CM | POA: Diagnosis not present

## 2023-01-09 DIAGNOSIS — E860 Dehydration: Secondary | ICD-10-CM | POA: Diagnosis not present

## 2023-01-09 DIAGNOSIS — R569 Unspecified convulsions: Secondary | ICD-10-CM | POA: Diagnosis not present

## 2023-01-09 DIAGNOSIS — K760 Fatty (change of) liver, not elsewhere classified: Secondary | ICD-10-CM | POA: Diagnosis not present

## 2023-01-09 DIAGNOSIS — A040 Enteropathogenic Escherichia coli infection: Secondary | ICD-10-CM | POA: Diagnosis not present

## 2023-01-09 DIAGNOSIS — I110 Hypertensive heart disease with heart failure: Secondary | ICD-10-CM | POA: Diagnosis not present

## 2023-01-09 DIAGNOSIS — R410 Disorientation, unspecified: Secondary | ICD-10-CM | POA: Diagnosis not present

## 2023-01-09 DIAGNOSIS — R112 Nausea with vomiting, unspecified: Secondary | ICD-10-CM | POA: Diagnosis not present

## 2023-01-09 DIAGNOSIS — A044 Other intestinal Escherichia coli infections: Secondary | ICD-10-CM | POA: Diagnosis not present

## 2023-01-09 DIAGNOSIS — E0590 Thyrotoxicosis, unspecified without thyrotoxic crisis or storm: Secondary | ICD-10-CM | POA: Diagnosis not present

## 2023-01-09 DIAGNOSIS — J449 Chronic obstructive pulmonary disease, unspecified: Secondary | ICD-10-CM | POA: Diagnosis not present

## 2023-01-09 DIAGNOSIS — I2510 Atherosclerotic heart disease of native coronary artery without angina pectoris: Secondary | ICD-10-CM | POA: Diagnosis not present

## 2023-01-09 DIAGNOSIS — G238 Other specified degenerative diseases of basal ganglia: Secondary | ICD-10-CM | POA: Diagnosis not present

## 2023-01-10 DIAGNOSIS — R1084 Generalized abdominal pain: Secondary | ICD-10-CM | POA: Diagnosis not present

## 2023-01-11 DIAGNOSIS — R1084 Generalized abdominal pain: Secondary | ICD-10-CM | POA: Diagnosis not present

## 2023-01-11 DIAGNOSIS — R9431 Abnormal electrocardiogram [ECG] [EKG]: Secondary | ICD-10-CM | POA: Diagnosis not present

## 2023-01-12 DIAGNOSIS — R1084 Generalized abdominal pain: Secondary | ICD-10-CM | POA: Diagnosis not present

## 2023-01-13 DIAGNOSIS — R1084 Generalized abdominal pain: Secondary | ICD-10-CM | POA: Diagnosis not present

## 2023-01-15 ENCOUNTER — Other Ambulatory Visit: Payer: Self-pay

## 2023-01-15 MED ORDER — CLOPIDOGREL BISULFATE 75 MG PO TABS: 75.0000 mg | ORAL_TABLET | Freq: Every day | ORAL | 0 refills | Status: DC

## 2023-01-18 ENCOUNTER — Telehealth: Payer: Self-pay

## 2023-01-18 NOTE — Telephone Encounter (Signed)
   Patient: Donna Little  DOB: 09/17/1945  MRN: [id number]  Evenity benefits verified by [employer] on: 01/15/23 Coverage Available: Buy & Bill Prior authorization NOT REQUIRED Deductible: $240 of $240 met   Benefit Details: Benefits subject to a $240.00 deductible ($240.00 met) and 20.0%% co-insurance for the administration and cost of Evenity.  For the Secondary MD Purchase access option, this plan is a Medicare Supplement Plan F and it covers the Medicare Part B deductible, co-insurance and 100% of the excess charges.  Patient agreed to proceed with ordering and will be scheduled once it has arrived in the office.  Jacklynn Bue, LPN

## 2023-01-19 ENCOUNTER — Telehealth: Payer: Self-pay

## 2023-01-19 NOTE — Telephone Encounter (Signed)
Donna Little called about the evenity process.  Verification has been sent per Selena Batten and she has sent the information for prior authorization.  Patient was notified.

## 2023-01-20 ENCOUNTER — Other Ambulatory Visit: Payer: Self-pay | Admitting: Family Medicine

## 2023-01-20 ENCOUNTER — Telehealth: Payer: Self-pay

## 2023-01-20 DIAGNOSIS — M79641 Pain in right hand: Secondary | ICD-10-CM | POA: Diagnosis not present

## 2023-01-20 MED ORDER — PANTOPRAZOLE SODIUM 40 MG PO TBEC: 40.0000 mg | DELAYED_RELEASE_TABLET | Freq: Every day | ORAL | 3 refills | Status: DC

## 2023-01-20 MED ORDER — GABAPENTIN 800 MG PO TABS: 800.0000 mg | ORAL_TABLET | Freq: Three times a day (TID) | ORAL | 1 refills | Status: DC

## 2023-01-20 MED ORDER — MORPHINE SULFATE ER 15 MG PO TBCR: 15.0000 mg | EXTENDED_RELEASE_TABLET | Freq: Two times a day (BID) | ORAL | 0 refills | Status: DC

## 2023-01-20 NOTE — Telephone Encounter (Signed)
Donna Little with [facility] [phone] left voicemail stating patient is currently going to hand therapy and requested she complete her hand therapy and then use their services. He stated they will not be able to keep her referral open that long and at this time will discharge her. She is to call our office when she is ready for their services, he suggested an order for both PT and OCCUPATIONAL THERAPY when that time comes.

## 2023-01-21 NOTE — Telephone Encounter (Signed)
Ok. Dr Teighlor Korson

## 2023-01-25 DIAGNOSIS — I471 Supraventricular tachycardia, unspecified: Secondary | ICD-10-CM | POA: Diagnosis not present

## 2023-01-25 DIAGNOSIS — R0600 Dyspnea, unspecified: Secondary | ICD-10-CM | POA: Diagnosis not present

## 2023-01-25 DIAGNOSIS — I10 Essential (primary) hypertension: Secondary | ICD-10-CM | POA: Diagnosis not present

## 2023-01-27 ENCOUNTER — Other Ambulatory Visit: Payer: Self-pay

## 2023-01-27 ENCOUNTER — Ambulatory Visit: Payer: Medicare Other

## 2023-01-27 ENCOUNTER — Ambulatory Visit (INDEPENDENT_AMBULATORY_CARE_PROVIDER_SITE_OTHER): Payer: Medicare Other

## 2023-01-27 DIAGNOSIS — M810 Age-related osteoporosis without current pathological fracture: Secondary | ICD-10-CM | POA: Diagnosis not present

## 2023-01-27 DIAGNOSIS — M79641 Pain in right hand: Secondary | ICD-10-CM | POA: Diagnosis not present

## 2023-01-27 MED ORDER — ROMOSOZUMAB-AQQG 105 MG/1.17ML ~~LOC~~ SOSY
210.0000 mg | PREFILLED_SYRINGE | Freq: Once | SUBCUTANEOUS | Status: AC
  Administered 2023-01-27: 210 mg via SUBCUTANEOUS

## 2023-01-27 NOTE — Progress Notes (Signed)
   Patient: Donna Little  DOB: 03/21/1946  MRN: [id number]    Visit Date: 01/27/2023    Vanessa Ralphs presents today for her initial Evenity injection.  2 x 105 mg/1.17 ml Single-Dose Prefilled Syringes were given SQ, one in each arm.  Patient tolerated the injection well and has no questions.  The next Evenity injections will be due in one month.      Danny Lawless, NT

## 2023-01-28 MED ORDER — LEVETIRACETAM 500 MG PO TABS: 500.0000 mg | ORAL_TABLET | Freq: Two times a day (BID) | ORAL | 5 refills | Status: DC

## 2023-02-03 DIAGNOSIS — M79641 Pain in right hand: Secondary | ICD-10-CM | POA: Diagnosis not present

## 2023-02-15 DIAGNOSIS — M65331 Trigger finger, right middle finger: Secondary | ICD-10-CM | POA: Insufficient documentation

## 2023-02-15 DIAGNOSIS — S62609A Fracture of unspecified phalanx of unspecified finger, initial encounter for closed fracture: Secondary | ICD-10-CM | POA: Insufficient documentation

## 2023-02-15 DIAGNOSIS — M65341 Trigger finger, right ring finger: Secondary | ICD-10-CM | POA: Insufficient documentation

## 2023-02-15 DIAGNOSIS — S62602A Fracture of unspecified phalanx of right middle finger, initial encounter for closed fracture: Secondary | ICD-10-CM | POA: Diagnosis not present

## 2023-02-15 DIAGNOSIS — M79641 Pain in right hand: Secondary | ICD-10-CM | POA: Diagnosis not present

## 2023-02-15 DIAGNOSIS — M79642 Pain in left hand: Secondary | ICD-10-CM | POA: Insufficient documentation

## 2023-02-21 ENCOUNTER — Telehealth: Payer: Self-pay | Admitting: Family Medicine

## 2023-02-21 NOTE — Telephone Encounter (Signed)
Patient called to request that her morphine be called into her pharmacy as requested on Friday. She stated that she is not sure who she spoke with but they were not in the office and that it the new answering service. She states that she has 2 pills left. She is scheduled tomorrow at 340 for her hand surgery clearance. I advised her that one of the providers will be able to take care of that for her tomorrow.

## 2023-02-22 ENCOUNTER — Ambulatory Visit (INDEPENDENT_AMBULATORY_CARE_PROVIDER_SITE_OTHER): Payer: Medicare Other

## 2023-02-22 ENCOUNTER — Other Ambulatory Visit: Payer: Self-pay

## 2023-02-22 VITALS — BP 102/60 | HR 95 | Temp 97.9°F | Resp 16 | Ht 60.0 in | Wt 124.6 lb

## 2023-02-22 DIAGNOSIS — M65331 Trigger finger, right middle finger: Secondary | ICD-10-CM | POA: Diagnosis not present

## 2023-02-22 DIAGNOSIS — M65341 Trigger finger, right ring finger: Secondary | ICD-10-CM

## 2023-02-22 DIAGNOSIS — S62602A Fracture of unspecified phalanx of right middle finger, initial encounter for closed fracture: Secondary | ICD-10-CM

## 2023-02-22 DIAGNOSIS — R221 Localized swelling, mass and lump, neck: Secondary | ICD-10-CM | POA: Diagnosis not present

## 2023-02-22 DIAGNOSIS — F17210 Nicotine dependence, cigarettes, uncomplicated: Secondary | ICD-10-CM

## 2023-02-22 DIAGNOSIS — R7303 Prediabetes: Secondary | ICD-10-CM | POA: Insufficient documentation

## 2023-02-22 DIAGNOSIS — N1831 Chronic kidney disease, stage 3a: Secondary | ICD-10-CM | POA: Insufficient documentation

## 2023-02-22 DIAGNOSIS — F17200 Nicotine dependence, unspecified, uncomplicated: Secondary | ICD-10-CM | POA: Insufficient documentation

## 2023-02-22 DIAGNOSIS — M2550 Pain in unspecified joint: Secondary | ICD-10-CM | POA: Insufficient documentation

## 2023-02-22 MED ORDER — MORPHINE SULFATE ER 15 MG PO TBCR: 15.0000 mg | EXTENDED_RELEASE_TABLET | Freq: Two times a day (BID) | ORAL | 0 refills | Status: DC

## 2023-02-22 NOTE — Patient Instructions (Addendum)
Since you have the stress test scheduled for Wednesday morning, it will help to risk assess better. If the stress test comes back normal, then you may be okay to hold the plavix for 5 days prior to your surgery Please try not to smoke as much to help with wound healing Though you have multiple heart risk factors, since this is a small procedure which is going to be done under mild sedation, your risk is low to moderate Increase water intake and see if you can get away with one LASIX 40 mg twice daily instead of 80 mg twice daily, as you have no leg swellings and no fluid in your lungs. Return with Dr.Cox in 3 months

## 2023-02-22 NOTE — Progress Notes (Unsigned)
Subjective:  Patient ID: Donna Little, female    DOB: 12-Feb-1946  Age: 77 y.o. MRN: [id number]  Chief Complaint  Patient presents with  . Pre-op Exam    HPI Emmelia is a patient of Dr. Sedalia Muta, here for a preoperative evaluation. She is being scheduled for right middle and ring finger A1 pulley release to be done by Dr. Marlyne Beards at [facility], she does not have a scheduled date yet.  The type of anesthesia is not mentioned on the requisition.  Looks like patient had a fall from her recliner, back in September 2024 has developed right hand pain.  Her x-rays were reportedly negative for fracture.  She was recommended conservative therapy and exercises. She went back to the hand surgeon on February 15, 2023 with persistent pain in her right middle and ring fingers with locking and catching of these fingers. 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 Different options were discussed including surgical option for open A1 pulley release and patient preferred to proceed with the procedure.   Patient has a history of SVT, hypertension, follows up with cardiology, for which she takes metoprolol succinate 50 mg daily. She was noted to have coronary artery calcifications on a CT scan and history of paroxysmal atrial fibrillation with ablation and rapid response. She was recommended to continue Plavix anticoagulation at this time.  She was to be scheduled to have an adenosine nuclear scan to exclude ischemia in view of her shortness of breath but I do not see that she had this.    She is on chronic pain medication with morphine.  On her PDMP, I see that she has had morphine sulfate extended release 30 mg tablets and she received a prescription for 90 tablets for 30 days for several refills until September 2023.  Then she started to get morphine sulfate extended release 15 mg tablets, 60 tablets/month from  February 18, 2022.  But then she has received 90 tablets of morphine sulfate extended release 15 mg starting in May 2024.  She had a refill in May, July, September each for 90 tablets. Then again on January 20, 2023, she received only 60 tablets of morphine sulfate extended release 15 mg tablets.  She has not received a refill for November yet.  She feels the medication dosage and frequency are not sufficient for her chronic pain.  Her last blood work in August 2024 showed hemoglobin A1c of 6.1.  She seems to have borderline diabetes She has CKD stage III     11/25/2022    2:09 PM 06/03/2022   11:43 AM 04/22/2022   10:34 AM 03/18/2022   10:17 AM 12/11/2021   11:47 AM  Depression screen PHQ 2/9  Decreased Interest 2 3 3 2  0  Down, Depressed, Hopeless 2 3 3 2 2   PHQ - 2 Score 4 6 6 4 2   Altered sleeping 2 3 3 1 2   Tired, decreased energy 2 3 3 1 2   Change in appetite 2 3 3  0 1  Feeling bad or failure about yourself  0 0 3 0 2  Trouble concentrating 0 0 3 0 0  Moving slowly or fidgety/restless 2 0 3 0 0  Suicidal thoughts 0 0 0 0 0  PHQ-9 Score 12 15 24 6 9   Difficult doing work/chores Somewhat difficult Very difficult Extremely dIfficult  Somewhat difficult        11/25/2022  2:09 PM  Fall Risk   Falls in the past year? 1  Number falls in past yr: 1  Injury with Fall? 1  Risk for fall due to : Impaired balance/gait;History of fall(s)  Follow up Falls evaluation completed;Education provided;Falls prevention discussed    Patient Care Team: Blane Ohara, MD as PCP - General (Family Medicine) Beverely Pace, Osborn Coho, MD as Referring Physician (Cardiology) Webb Silversmith, MD (Gastroenterology) Drema Dallas, DO as Consulting Physician (Neurology) Zenovia Jordan, MD as Consulting Physician (Rheumatology) Leone Payor., MD (Surgery) Powers, Patrick North, MD as Referring Physician (Neurosurgery) Prescilla Sours, FNP as Nurse Practitioner (Urology) Zettie Pho, [facility] (Inactive)  (Pharmacist)   Review of Systems  Constitutional: Negative.   HENT: Negative.    Eyes: Negative.   Respiratory:  Positive for shortness of breath. Negative for chest tightness.   Musculoskeletal:  Positive for arthralgias and back pain.       Right hand finger pain   Psychiatric/Behavioral: Negative.    All other systems reviewed and are negative.   Current Outpatient Medications on File Prior to Visit  Medication Sig Dispense Refill  . acetaminophen (TYLENOL) 325 MG tablet Take by mouth.    Marland Kitchen albuterol (VENTOLIN HFA) 108 (90 Base) MCG/ACT inhaler Inhale 2 puffs into the lungs every 6 (six) hours as needed. 18 g 1  . amLODipine (NORVASC) 5 MG tablet Take 1 tablet (5 mg total) by mouth daily. 90 tablet 1  . atorvastatin (LIPITOR) 80 MG tablet Take 1 tablet (80 mg total) by mouth daily. 90 tablet 1  . clopidogrel (PLAVIX) 75 MG tablet Take 1 tablet (75 mg total) by mouth daily. 90 tablet 0  . dicyclomine (BENTYL) 20 MG tablet TAKE 1 TABLET BY MOUTH 4 TIMES DAILY BEFORE MEAL(S) AND AT BEDTIME 360 tablet 0  . furosemide (LASIX) 40 MG tablet Take 2 tablets (80 mg total) by mouth 2 (two) times daily. 360 tablet 1  . gabapentin (NEURONTIN) 800 MG tablet Take 1 tablet (800 mg total) by mouth 3 (three) times daily. 270 tablet 1  . levETIRAcetam (KEPPRA) 500 MG tablet Take 1 tablet (500 mg total) by mouth 2 (two) times daily. 60 tablet 5  . meclizine (ANTIVERT) 25 MG tablet Take 1 tablet (25 mg total) by mouth every 8 (eight) hours as needed for dizziness. 30 tablet 2  . metoprolol succinate (TOPROL-XL) 25 MG 24 hr tablet Take 1 tablet by mouth daily.    . naloxone (NARCAN) nasal spray 4 mg/0.1 mL 1 spray (4 mg) intranasally into 1 nostril. Use a new Narcan(R) nasal spray for subsequent doses and administer into alternating nostrils. May repeat every 2 to 3 minutes 2 each 1  . nitroGLYCERIN (NITROSTAT) 0.4 MG SL tablet Place under the tongue.    . pantoprazole (PROTONIX) 40 MG tablet Take 1 tablet  (40 mg total) by mouth daily. 90 tablet 3  . potassium chloride SA (KLOR-CON M) 20 MEQ tablet TAKE 1 TABLET BY MOUTH TWICE DAILY WITH A MEAL 180 tablet 0  . Romosozumab-aqqg (EVENITY) 105 MG/1. SOSY injection Inject 210 mg into the skin once.    . DULoxetine (CYMBALTA) 60 MG capsule Take 1 capsule (60 mg total) by mouth daily. (Patient not taking: Reported on 02/22/2023) 90 capsule 0   No current facility-administered medications on file prior to visit.   Past Medical History:  Diagnosis Date  . A-fib (HCC)   . Atherosclerosis of both carotid arteries 2022   noted per CT on 05/14/20  .  Chronic pain syndrome   . Chronic systolic heart failure (HCC)   . Depression   . Emphysema of lung (HCC) 2022   noted per CT scan on 05/14/20  . Generalized atherosclerosis   . GERD (gastroesophageal reflux disease)   . Hypertension   . IBS (irritable bowel syndrome)   . Melanoma (HCC)   . Other specified nontoxic goiter   . Palpitations   . PMR (polymyalgia rheumatica) (HCC) 07/04/2020  . Polymyalgia rheumatica (HCC)   . Primary pulmonary hypertension (HCC)   . Pulmonary embolism (HCC)   . Stroke (HCC)    ministroke  . Supraventricular tachycardia (HCC)   . Type 2 diabetes mellitus (HCC)    Past Surgical History:  Procedure Laterality Date  . ABDOMINAL HYSTERECTOMY    . APPENDECTOMY    . CATARACT EXTRACTION    . CORONARY ANGIOPLASTY WITH STENT PLACEMENT    . KYPHOPLASTY  05/2020   L4-L5    Family History  Problem Relation Age of Onset  . Heart disease Mother   . Diabetes Mother   . Hyperlipidemia Mother   . Hypertension Mother   . Stroke Mother   . Heart attack Mother   . Heart disease Brother   . Heart attack Brother   . Stroke Brother   . Hypertension Brother   . Hyperlipidemia Brother    Social History   Socioeconomic History  . Marital status: Single    Spouse name: Not on file  . Number of children: 2  . Years of education: Not on file  . Highest education level:  Not on file  Occupational History  . Occupation: retired  Tobacco Use  . Smoking status: Light Smoker    Current packs/day: 0.20    Average packs/day: 0.4 packs/day for 100.0 years (35.0 ttl pk-yrs)    Types: Cigarettes  . Smokeless tobacco: Never  . Tobacco comments:    smokes 1-2 cigarettes per week.  Vaping Use  . Vaping status: Never Used  Substance and Sexual Activity  . Alcohol use: No    Alcohol/week: 0.0 standard drinks of alcohol  . Drug use: No  . Sexual activity: Not Currently  Other Topics Concern  . Not on file  Social History Narrative  . Not on file   Social Determinants of Health   Financial Resource Strain: Low Risk  (12/04/2022)   Received from [facility]   Overall Financial Resource Strain (CARDIA)   . Difficulty of Paying Living Expenses: Not hard at all  Food Insecurity: No Food Insecurity (12/04/2022)   Received from [facility]   Hunger Vital Sign   . Worried About Programme researcher, broadcasting/film/video in the Last Year: Never true   . Ran Out of Food in the Last Year: Never true  Transportation Needs: No Transportation Needs (12/04/2022)   Received from [facility] - Transportation   . Lack of Transportation (Medical): No   . Lack of Transportation (Non-Medical): No  Physical Activity: Inactive (03/16/2022)   Received from [facility], [facility]   Exercise Vital Sign   . Days of Exercise per Week: 0 days   . Minutes of Exercise per Session: 0 min  Stress: Stress Concern Present (03/16/2022)   Received from [facility], [facility] of Occupational Health - Occupational Stress Questionnaire   . Feeling of Stress : To some extent  Social Connections: Socially Integrated (03/16/2022)   Received from [facility], [facility]   Social Network   .  How would you rate your social network (family, work, friends)?: Good participation with social networks    Objective:  BP 102/60 (BP Location: Left Arm, Patient Position:  Sitting, Cuff Size: Normal)   Pulse 95   Temp 97.9 F (36.6 C) (Temporal)   Resp 16   Ht 5' (1.524 m)   Wt 124 lb 9.6 oz (56.5 kg)   LMP  (LMP Unknown)   SpO2 94%   BMI 24.33 kg/m      02/22/2023    3:31 PM 12/16/2022    1:39 PM 12/02/2022   10:50 AM  BP/Weight  Systolic BP 102 116 122  Diastolic BP 60 70 60  Wt. (Lbs) 124.6 122 120.6  BMI 24.33 kg/m2 23.83 kg/m2 21.36 kg/m2    Physical Exam  Diabetic Foot Exam - Simple   No data filed      Lab Results  Component Value Date   WBC 7.2 11/25/2022   HGB 14.4 11/25/2022   HCT 42.6 11/25/2022   PLT 239 11/25/2022   GLUCOSE 85 11/25/2022   CHOL 195 11/25/2022   TRIG 105 11/25/2022   HDL 48 11/25/2022   LDLCALC 128 (H) 11/25/2022   ALT 9 11/25/2022   AST 26 11/25/2022   NA 139 11/25/2022   K 4.1 11/25/2022   CL 91 (L) 11/25/2022   CREATININE 1.00 11/25/2022   BUN 18 11/25/2022   CO2 30 (H) 11/25/2022   TSH 1.020 11/25/2022   HGBA1C 6.1 (H) 11/25/2022   MICROALBUR 30 01/20/2020      Assessment & Plan:    There are no diagnoses linked to this encounter.   No orders of the defined types were placed in this encounter.   No orders of the defined types were placed in this encounter.    Follow-up: No follow-ups on file.   I,Angela Taylor,acting as a Neurosurgeon for [employer], MD.,have documented all relevant documentation on the behalf of Windell Moment, MD,as directed by  Windell Moment, MD while in the presence of Windell Moment, MD.   An After Visit Summary was printed and given to the patient.  Windell Moment, MD [facility] [phone]

## 2023-02-23 LAB — CBC WITH DIFFERENTIAL/PLATELET
Basophils Absolute: 0.1 10*3/uL (ref 0.0–0.2)
Basos: 1 %
EOS (ABSOLUTE): 0.5 10*3/uL — ABNORMAL HIGH (ref 0.0–0.4)
Eos: 7 %
Hematocrit: 42.2 % (ref 34.0–46.6)
Hemoglobin: 13.8 g/dL (ref 11.1–15.9)
Immature Grans (Abs): 0 10*3/uL (ref 0.0–0.1)
Immature Granulocytes: 0 %
Lymphocytes Absolute: 2.1 10*3/uL (ref 0.7–3.1)
Lymphs: 33 %
MCH: 31.7 pg (ref 26.6–33.0)
MCHC: 32.7 g/dL (ref 31.5–35.7)
MCV: 97 fL (ref 79–97)
Monocytes Absolute: 0.7 10*3/uL (ref 0.1–0.9)
Monocytes: 11 %
Neutrophils Absolute: 3.2 10*3/uL (ref 1.4–7.0)
Neutrophils: 48 %
Platelets: 222 10*3/uL (ref 150–450)
RBC: 4.36 x10E6/uL (ref 3.77–5.28)
RDW: 13.2 % (ref 11.7–15.4)
WBC: 6.5 10*3/uL (ref 3.4–10.8)

## 2023-02-23 LAB — COMPREHENSIVE METABOLIC PANEL
ALT: 16 [IU]/L (ref 0–32)
AST: 30 [IU]/L (ref 0–40)
Albumin: 3.9 g/dL (ref 3.8–4.8)
Alkaline Phosphatase: 200 [IU]/L — ABNORMAL HIGH (ref 44–121)
BUN/Creatinine Ratio: 13 (ref 12–28)
BUN: 11 mg/dL (ref 8–27)
Bilirubin Total: <0.2 mg/dL (ref 0.0–1.2)
CO2: 29 mmol/L (ref 20–29)
Calcium: 9 mg/dL (ref 8.7–10.3)
Chloride: 101 mmol/L (ref 96–106)
Creatinine, Ser: 0.83 mg/dL (ref 0.57–1.00)
Globulin, Total: 2.7 g/dL (ref 1.5–4.5)
Glucose: 68 mg/dL — ABNORMAL LOW (ref 70–99)
Potassium: 4.2 mmol/L (ref 3.5–5.2)
Sodium: 143 mmol/L (ref 134–144)
Total Protein: 6.6 g/dL (ref 6.0–8.5)
eGFR: 72 mL/min/{1.73_m2} (ref >59)

## 2023-02-23 LAB — HEMOGLOBIN A1C
Est. average glucose Bld gHb Est-mCnc: 137 mg/dL
Hgb A1c MFr Bld: 6.4 % — ABNORMAL HIGH (ref 4.8–5.6)

## 2023-02-23 NOTE — Assessment & Plan Note (Addendum)
Scheduled for release surgery under MAC anesthesia due to high risk factors for general anesthesia. Significant pain and inability to make a fist reported. Risks include potential bleeding due to Plavix, but benefits include pain relief and improved hand function. Low risk due to minor procedure and mild sedation. Decision to proceed based on low risk and need for pain relief. - Proceed with scheduled surgery - Hold Plavix for 5 days prior to surgery if stress test is normal - Advise to reduce smoking to improve wound healing  History of heart surgery (ablation) and heart failure. Scheduled for a nuclear stress test to evaluate current cardiac function. No significant chest pain but experienced arrhythmias and wore a heart monitor recently. Stress test to determine if Plavix can be held safely before surgery.   2 POINTS, 10.1% 30 DAY RISK ON REVISED CARDIAC RISK INDEX AND 0.4% risk on Gupta perioperative cardiac risk model. - Complete nuclear stress test on Wednesday - Follow up with cardiology based on stress test results   Previous blood work indicated normal thyroid function. No current symptoms reported. Previous confusion about thyroid results clarified. - No immediate action required  General Health Maintenance Due for repeat blood work to monitor various health parameters including CBC, kidney function, and A1c. - Order blood work including CBC, kidney function, and A1c  Follow-up - Schedule follow-up appointment with Dr. Sedalia Muta in two months.

## 2023-02-23 NOTE — Assessment & Plan Note (Signed)
Slightly elevated blood sugars. No current symptoms of diabetes reported. Advised to monitor blood sugars closely to prevent progression to diabetes. - Order blood work including A1c

## 2023-02-23 NOTE — Assessment & Plan Note (Signed)
Decreased kidney function and previous kidney failure. Currently on high doses of Lasix (80 mg twice daily). Advised to drink more water and consider reducing Lasix dosage if possible to improve kidney function. - Order blood work to assess kidney function - Encourage to drink more water and consider reducing Lasix dosage if possible

## 2023-02-23 NOTE — Assessment & Plan Note (Signed)
Reports widespread pain in hands, legs, knees, and elbows. She is not sure of her diagnosis, if it is  fibromyalgia or  polymyalgia rheumatica. Referral to rheumatology for further evaluation. - Place referral to Dr. Orlin Hilding at [facility] - Schedule appointment after other medical issues are addressed, likely in January

## 2023-02-23 NOTE — Assessment & Plan Note (Signed)
Scheduled for release surgery under MAC anesthesia due to high risk factors for general anesthesia. Significant pain and inability to make a fist reported. Risks include potential bleeding due to Plavix, but benefits include pain relief and improved hand function. Low risk due to minor procedure and mild sedation. Decision to proceed based on low risk and need for pain relief. - Proceed with scheduled surgery - Hold Plavix for 5 days prior to surgery if stress test is normal - Advise to reduce smoking to improve wound healing

## 2023-02-23 NOTE — Assessment & Plan Note (Signed)
Smokes pack and half per day. Advised to reduce smoking to improve surgical outcomes and wound healing. Smoking cessation important for overall health and surgical recovery. - Advise to reduce smoking, especially before surgery.  TIME SPENT COUNSELING 4 MINS

## 2023-02-24 ENCOUNTER — Telehealth: Payer: Self-pay | Admitting: Family Medicine

## 2023-02-24 DIAGNOSIS — R0600 Dyspnea, unspecified: Secondary | ICD-10-CM | POA: Diagnosis not present

## 2023-02-24 DIAGNOSIS — I10 Essential (primary) hypertension: Secondary | ICD-10-CM | POA: Diagnosis not present

## 2023-02-24 DIAGNOSIS — I471 Supraventricular tachycardia, unspecified: Secondary | ICD-10-CM | POA: Diagnosis not present

## 2023-02-24 NOTE — Telephone Encounter (Signed)
   Donna Little has been scheduled for the following appointment:  WHAT: NECK ULTRASOUND WHERE: [facility] DATE: 03/01/2023 TIME: 12:30 PM CHECK-IN  Patient has been made aware.

## 2023-03-01 ENCOUNTER — Ambulatory Visit: Payer: Medicare Other

## 2023-03-01 DIAGNOSIS — R221 Localized swelling, mass and lump, neck: Secondary | ICD-10-CM | POA: Diagnosis not present

## 2023-03-10 ENCOUNTER — Other Ambulatory Visit: Payer: Self-pay

## 2023-03-10 ENCOUNTER — Telehealth: Payer: Self-pay | Admitting: Family Medicine

## 2023-03-10 ENCOUNTER — Ambulatory Visit (INDEPENDENT_AMBULATORY_CARE_PROVIDER_SITE_OTHER): Payer: Medicare Other

## 2023-03-10 ENCOUNTER — Encounter (HOSPITAL_BASED_OUTPATIENT_CLINIC_OR_DEPARTMENT_OTHER): Payer: Self-pay | Admitting: Orthopedic Surgery

## 2023-03-10 ENCOUNTER — Ambulatory Visit: Payer: Medicare Other

## 2023-03-10 DIAGNOSIS — M810 Age-related osteoporosis without current pathological fracture: Secondary | ICD-10-CM | POA: Diagnosis not present

## 2023-03-10 MED ORDER — ROMOSOZUMAB-AQQG 105 MG/1.17ML ~~LOC~~ SOSY
210.0000 mg | PREFILLED_SYRINGE | Freq: Once | SUBCUTANEOUS | Status: AC
  Administered 2023-03-10: 210 mg via SUBCUTANEOUS

## 2023-03-10 NOTE — Progress Notes (Signed)
Patient presented for nurse visit for evenity injections. Injections administered BL upper arms subcutaneous. See MAR for additional info. Patient to follow up in 1 month.

## 2023-03-10 NOTE — Progress Notes (Signed)
   03/10/23 1424  PAT Phone Screen  Is the patient taking a GLP-1 receptor agonist? No  Do You Have Diabetes? No (pre diabetic)  Do You Have Hypertension? Yes  Have You Ever Been to the ER for Asthma? No  Have You Taken Oral Steroids in the Past 3 Months? No  Do you Take Phenteramine or any Other Diet Drugs? No  Recent  Lab Work, EKG, CXR? Yes  Where was this test performed? cmet, cbc 02/22/2023 in care everywhere, ekg 05/2022 and 01/09/2023  Do you have a history of heart problems? Yes  Cardiologist Name Dr Chales Abrahams, Smitty Cords in [facility]  Have you ever had tests on your heart? Yes  What cardiac tests were performed? Stress Test  What date/year were cardiac tests completed? 02/24/23  Results viewable: Care Everywhere  Any Recent Hospitalizations? Yes  Height 5' (1.524 m)  Weight 54.4 kg  Pat Appointment Scheduled No  Reason for No Appointment Not Needed

## 2023-03-10 NOTE — Progress Notes (Signed)
   03/10/23 1424  Pre-op Phone Call  Surgery Date Verified 03/17/23  Arrival Time Verified 0815  Surgery Location Verified [facility] [place]  Is the patient taking a GLP-1 receptor agonist? No  Does the patient have diabetes? Pre-diabetes  Do you have a history of heart problems? Yes  Cardiologist Name Dr Chales Abrahams, Smitty Cords in [facility]  Have you ever had tests on your heart? Yes  What cardiac tests were performed? Stress Test  What date/year were cardiac tests completed? 02/24/23  Results viewable: Care Everywhere  Patient Teaching Pre / Post Procedure  Patient educated about smoking cessation 24 hours prior to surgery. Yes  Patient verbalizes understanding of bowel prep? N/A  Med Rec Completed Yes  Take the Following Meds the Morning of Surgery all meds except hold plavix for 5 days and no lasix am of surgery  Recent  Lab Work, EKG, CXR? Yes  NPO (Including gum & candy) After midnight  Stop Solids, Milk, Candy, and Gum STARTING AT MIDNIGHT  Responsible adult to drive and be with you for 24 hours? Yes  Name & Phone Number for Ride/Caregiver son Brynda Greathouse [phone]  No Jewelry, money, nail polish or make-up.  No lotions, powders, perfumes. No shaving  48 hrs. prior to surgery. Yes  Contacts, Dentures & Glasses Will Have to be Removed Before OR. Yes  Please bring your ID and Insurance Card the morning of your surgery. (Surgery Centers Only) Yes  Bring any papers or x-rays with you that your surgeon gave you. Yes  Instructed to contact the location of procedure/ provider if they or anyone in their household develops symptoms or tests positive for COVID-19, has close contact with someone who tests positive for COVID, or has known exposure to any contagious illness. Yes  Call this number the morning of surgery  with any problems that may cancel your surgery. [phone]  Covid-19 Assessment  Have you had a positive COVID-19 test within the previous 90 days? No  COVID Testing Guidance Proceed with the  additional questions.  Patient's surgery required a COVID-19 test (cardiothoracic, complex ENT, and bronchoscopies/ EBUS) No

## 2023-03-12 ENCOUNTER — Ambulatory Visit: Payer: Self-pay | Admitting: Family Medicine

## 2023-03-12 DIAGNOSIS — I471 Supraventricular tachycardia, unspecified: Secondary | ICD-10-CM | POA: Diagnosis not present

## 2023-03-12 DIAGNOSIS — I10 Essential (primary) hypertension: Secondary | ICD-10-CM | POA: Diagnosis not present

## 2023-03-12 DIAGNOSIS — R0600 Dyspnea, unspecified: Secondary | ICD-10-CM | POA: Diagnosis not present

## 2023-03-12 NOTE — Telephone Encounter (Signed)
Called patient left voicemail [facility] [place], if not call office to get appointment to see if she can be see Monday

## 2023-03-12 NOTE — Telephone Encounter (Signed)
Copied from CRM [id number]. Topic: Clinical - Red Word Triage >> Mar 12, 2023 11:47 AM Maxwell Marion wrote: Red Word that prompted transfer to Nurse Triage: running into walls, thinks she may be having an allergic reaction or something to the evenity injections  Chief Complaint: dizziness  Symptoms: dizziness and nausea Frequency: started after injection received in office on 03/10/2023 Pertinent Negatives: Patient denies vomitting Disposition: [] ED /[] Urgent Care (no appt availability in office) / [] Appointment(In office/virtual)/ []  [facility]/ [] Home Care/ [x] Refused Recommended Disposition /[]  Mobile Bus/ []  Follow-up with PCP Additional Notes: pt called stated dizziness "running into walls" and wanted to know side effects of shot.  Nurse attempted to make in office appt for pt to be evaluated and treated by PCP/provider: pt refused stated has to leave now to go to another appt.  Nurse attempted to provide home care advise and pt refused stated she has to leave now for appt and will have to call back and start process over when she gets back.  Nurse informed pt will send information over to PCP to make aware of pt s/s and request a call back for pt and further instructions.  Answer Assessment - Initial Assessment Questions 1. DESCRIPTION: "Describe your dizziness."     Dizziness started after shot received on 03/10/2023 2. VERTIGO: "Do you feel like either you or the room is spinning or tilting?"      Feels like possible vertigo like feeling - pt stated "I am running into wall" 3. LIGHTHEADED: "Do you feel lightheaded?" (e.g., somewhat faint, woozy, weak upon standing)     Just dizziness 4. SEVERITY: "How bad is it?"  "Can you walk?"   - MILD: Feels slightly dizzy and unsteady, but is walking normally.   - MODERATE: Feels unsteady when walking, but not falling; interferes with normal activities (e.g., school, work).   - SEVERE: Unable to walk without falling, or requires  assistance to walk without falling.     moderate 5. ONSET:  "When did the dizziness begin?"     Begin 03/10/2023 after injection 6. AGGRAVATING FACTORS: "Does anything make it worse?" (e.g., standing, change in head position)     N/a 7. CAUSE: "What do you think is causing the dizziness?"     Possible reaction to shot 8. RECURRENT SYMPTOM: "Have you had dizziness before?" If Yes, ask: "When was the last time?" "What happened that time?"     N/a 9. OTHER SYMPTOMS: "Do you have any other symptoms?" (e.g., headache, weakness, numbness, vomiting, earache)     nausea 10. PREGNANCY: "Is there any chance you are pregnant?" "When was your last menstrual period?"       no  Protocols used: Dizziness - Vertigo-A-AH

## 2023-03-16 NOTE — Anesthesia Preprocedure Evaluation (Signed)
Anesthesia Evaluation  Patient identified by MRN, date of birth, ID band Patient awake    Reviewed: Allergy & Precautions, NPO status , Patient's Chart, lab work & pertinent test results  History of Anesthesia Complications Negative for: history of anesthetic complications  Airway Mallampati: III  TM Distance: >3 FB Neck ROM: Full   Comment: Previous grade I view with MAC 3 Dental  (+) Dental Advisory Given, Edentulous Upper, Edentulous Lower   Pulmonary shortness of breath (at baseline), neg sleep apnea, COPD (no recent flares), Current Smoker, PE   Pulmonary exam normal breath sounds clear to auscultation       Cardiovascular hypertension (amlodipine, metoprolol), Pt. on medications pulmonary hypertension+ CAD and +CHF  Cardiac stents: 2021.+ dysrhythmias Atrial Fibrillation and Supra Ventricular Tachycardia  Rhythm:Regular Rate:Normal  HLD, carotid atherosclerosis  Stress Test 02/24/2023: CONCLUSIONS:  # Abnormal myocardial perfusion imaging  #  Small-to-moderate partially reversible perfusion defect in the basal inferolateral, mid inferolateral and apical lateral segments likely related to diaphragmatic attenuation although small area of ischemia cannot be excluded.  #   Normal LV systolic function   TTE 08/20/2021: SUMMARY  borderline RVE, other chamber sizes normal  normal LV function  mild AV sclerosis without AS-AI  no effusion.     Neuro/Psych  Headaches, Seizures -, Well Controlled,  PSYCHIATRIC DISORDERS  Depression     Neuromuscular disease (back pain with sciatica) CVA, No Residual Symptoms    GI/Hepatic Neg liver ROS,GERD  Medicated,,IBS   Endo/Other  diabetes (Hgb A1c 6.4), Well Controlled, Type 2    Renal/GU CRFRenal disease     Musculoskeletal PMR, osteoporosis   Abdominal   Peds  Hematology negative hematology ROS (+) Lab Results      Component                Value               Date                       WBC                      6.5                 02/22/2023                HGB                      13.8                02/22/2023                HCT                      42.2                02/22/2023                MCV                      97                  02/22/2023                PLT                      222  02/22/2023              Anesthesia Other Findings Last Plavix: 6 days ago  Takes MS Contin 15 mg q12h  Reproductive/Obstetrics                             Anesthesia Physical Anesthesia Plan  ASA: 3  Anesthesia Plan: MAC   Post-op Pain Management: Tylenol PO (pre-op)*   Induction: Intravenous  PONV Risk Score and Plan: 1 and Ondansetron, Dexamethasone, Propofol infusion, TIVA and Treatment may vary due to age or medical condition  Airway Management Planned: Natural Airway and Simple Face Mask  Additional Equipment:   Intra-op Plan:   Post-operative Plan:   Informed Consent: I have reviewed the patients History and Physical, chart, labs and discussed the procedure including the risks, benefits and alternatives for the proposed anesthesia with the patient or authorized representative who has indicated his/her understanding and acceptance.     Dental advisory given  Plan Discussed with: CRNA and Anesthesiologist  Anesthesia Plan Comments: (Discussed with patient risks of MAC including, but not limited to, minor pain or discomfort, hearing people in the room, and possible need for backup general anesthesia. Risks for general anesthesia also discussed including, but not limited to, sore throat, hoarse voice, chipped/damaged teeth, injury to vocal cords, nausea and vomiting, allergic reactions, lung infection, heart attack, stroke, and death. All questions answered. )        Anesthesia Quick Evaluation

## 2023-03-17 ENCOUNTER — Ambulatory Visit (HOSPITAL_BASED_OUTPATIENT_CLINIC_OR_DEPARTMENT_OTHER): Payer: Self-pay | Admitting: Anesthesiology

## 2023-03-17 ENCOUNTER — Encounter (HOSPITAL_BASED_OUTPATIENT_CLINIC_OR_DEPARTMENT_OTHER)
Admission: RE | Disposition: A | Discharge status: Home / Self Care | Payer: Self-pay | Source: Home / Self Care | Attending: Orthopedic Surgery

## 2023-03-17 ENCOUNTER — Ambulatory Visit (HOSPITAL_BASED_OUTPATIENT_CLINIC_OR_DEPARTMENT_OTHER): Payer: Medicare Other | Admitting: Anesthesiology

## 2023-03-17 ENCOUNTER — Ambulatory Visit (HOSPITAL_BASED_OUTPATIENT_CLINIC_OR_DEPARTMENT_OTHER)
Admission: RE | Admit: 2023-03-17 | Discharge: 2023-03-17 | Disposition: A | Discharge status: Home / Self Care | Payer: Medicare Other | Attending: Orthopedic Surgery | Admitting: Orthopedic Surgery

## 2023-03-17 ENCOUNTER — Encounter (HOSPITAL_BASED_OUTPATIENT_CLINIC_OR_DEPARTMENT_OTHER): Payer: Self-pay | Admitting: Orthopedic Surgery

## 2023-03-17 DIAGNOSIS — Z8673 Personal history of transient ischemic attack (TIA), and cerebral infarction without residual deficits: Secondary | ICD-10-CM | POA: Insufficient documentation

## 2023-03-17 DIAGNOSIS — I471 Supraventricular tachycardia, unspecified: Secondary | ICD-10-CM | POA: Insufficient documentation

## 2023-03-17 DIAGNOSIS — M65331 Trigger finger, right middle finger: Secondary | ICD-10-CM | POA: Diagnosis not present

## 2023-03-17 DIAGNOSIS — F32A Depression, unspecified: Secondary | ICD-10-CM | POA: Insufficient documentation

## 2023-03-17 DIAGNOSIS — N189 Chronic kidney disease, unspecified: Secondary | ICD-10-CM | POA: Insufficient documentation

## 2023-03-17 DIAGNOSIS — I130 Hypertensive heart and chronic kidney disease with heart failure and stage 1 through stage 4 chronic kidney disease, or unspecified chronic kidney disease: Secondary | ICD-10-CM | POA: Insufficient documentation

## 2023-03-17 DIAGNOSIS — J439 Emphysema, unspecified: Secondary | ICD-10-CM | POA: Diagnosis not present

## 2023-03-17 DIAGNOSIS — E119 Type 2 diabetes mellitus without complications: Secondary | ICD-10-CM | POA: Diagnosis not present

## 2023-03-17 DIAGNOSIS — I272 Pulmonary hypertension, unspecified: Secondary | ICD-10-CM | POA: Insufficient documentation

## 2023-03-17 DIAGNOSIS — I5022 Chronic systolic (congestive) heart failure: Secondary | ICD-10-CM | POA: Diagnosis not present

## 2023-03-17 DIAGNOSIS — K219 Gastro-esophageal reflux disease without esophagitis: Secondary | ICD-10-CM | POA: Diagnosis not present

## 2023-03-17 DIAGNOSIS — M65841 Other synovitis and tenosynovitis, right hand: Secondary | ICD-10-CM | POA: Insufficient documentation

## 2023-03-17 DIAGNOSIS — M65341 Trigger finger, right ring finger: Secondary | ICD-10-CM | POA: Diagnosis not present

## 2023-03-17 DIAGNOSIS — Z79891 Long term (current) use of opiate analgesic: Secondary | ICD-10-CM | POA: Insufficient documentation

## 2023-03-17 DIAGNOSIS — R569 Unspecified convulsions: Secondary | ICD-10-CM | POA: Diagnosis not present

## 2023-03-17 DIAGNOSIS — I509 Heart failure, unspecified: Secondary | ICD-10-CM | POA: Diagnosis not present

## 2023-03-17 DIAGNOSIS — F17210 Nicotine dependence, cigarettes, uncomplicated: Secondary | ICD-10-CM | POA: Insufficient documentation

## 2023-03-17 DIAGNOSIS — Z86711 Personal history of pulmonary embolism: Secondary | ICD-10-CM | POA: Insufficient documentation

## 2023-03-17 DIAGNOSIS — E1122 Type 2 diabetes mellitus with diabetic chronic kidney disease: Secondary | ICD-10-CM | POA: Insufficient documentation

## 2023-03-17 DIAGNOSIS — Z7902 Long term (current) use of antithrombotics/antiplatelets: Secondary | ICD-10-CM | POA: Diagnosis not present

## 2023-03-17 DIAGNOSIS — N1831 Chronic kidney disease, stage 3a: Secondary | ICD-10-CM | POA: Diagnosis not present

## 2023-03-17 DIAGNOSIS — I2510 Atherosclerotic heart disease of native coronary artery without angina pectoris: Secondary | ICD-10-CM | POA: Diagnosis not present

## 2023-03-17 DIAGNOSIS — I4891 Unspecified atrial fibrillation: Secondary | ICD-10-CM | POA: Diagnosis not present

## 2023-03-17 DIAGNOSIS — E785 Hyperlipidemia, unspecified: Secondary | ICD-10-CM | POA: Diagnosis not present

## 2023-03-17 HISTORY — PX: TRIGGER FINGER RELEASE: SHX641

## 2023-03-17 SURGERY — RELEASE, A1 PULLEY, FOR TRIGGER FINGER
Anesthesia: Monitor Anesthesia Care | Site: Hand | Laterality: Right

## 2023-03-17 MED ORDER — ACETAMINOPHEN 500 MG PO TABS
1000.0000 mg | ORAL_TABLET | Freq: Once | ORAL | Status: AC
  Administered 2023-03-17: 1000 mg via ORAL

## 2023-03-17 MED ORDER — BUPIVACAINE HCL 0.25 % IJ SOLN
INTRAMUSCULAR | Status: DC | PRN
  Administered 2023-03-17: 10 mL

## 2023-03-17 MED ORDER — IPRATROPIUM-ALBUTEROL 0.5-2.5 (3) MG/3ML IN SOLN
3.0000 mL | RESPIRATORY_TRACT | Status: DC
  Administered 2023-03-17: 3 mL via RESPIRATORY_TRACT

## 2023-03-17 MED ORDER — IPRATROPIUM-ALBUTEROL 0.5-2.5 (3) MG/3ML IN SOLN
RESPIRATORY_TRACT | Status: AC
  Filled 2023-03-17: qty 3

## 2023-03-17 MED ORDER — SODIUM CHLORIDE 0.9 % IV SOLN: INTRAVENOUS | Status: DC | PRN

## 2023-03-17 MED ORDER — PROPOFOL 500 MG/50ML IV EMUL
INTRAVENOUS | Status: DC | PRN
  Administered 2023-03-17: 50 ug/kg/min via INTRAVENOUS

## 2023-03-17 MED ORDER — AMISULPRIDE (ANTIEMETIC) 5 MG/2ML IV SOLN: 10.0000 mg | Freq: Once | INTRAVENOUS | Status: DC | PRN

## 2023-03-17 MED ORDER — OXYCODONE HCL 5 MG PO TABS: 5.0000 mg | ORAL_TABLET | Freq: Once | ORAL | Status: DC | PRN

## 2023-03-17 MED ORDER — CEFAZOLIN SODIUM-DEXTROSE 2-4 GM/100ML-% IV SOLN
INTRAVENOUS | Status: AC
  Filled 2023-03-17: qty 100

## 2023-03-17 MED ORDER — FENTANYL CITRATE (PF) 100 MCG/2ML IJ SOLN: 25.0000 ug | INTRAMUSCULAR | Status: DC | PRN

## 2023-03-17 MED ORDER — OXYCODONE HCL 5 MG/5ML PO SOLN: 5.0000 mg | Freq: Once | ORAL | Status: DC | PRN

## 2023-03-17 MED ORDER — CEFAZOLIN SODIUM-DEXTROSE 2-4 GM/100ML-% IV SOLN
2.0000 g | INTRAVENOUS | Status: AC
  Administered 2023-03-17: 2 g via INTRAVENOUS

## 2023-03-17 MED ORDER — ACETAMINOPHEN 500 MG PO TABS
ORAL_TABLET | ORAL | Status: AC
  Filled 2023-03-17: qty 2

## 2023-03-17 MED ORDER — LACTATED RINGERS IV SOLN: INTRAVENOUS | Status: DC

## 2023-03-17 SURGICAL SUPPLY — 27 items
BLADE SURG 15 STRL LF DISP TIS (BLADE) ×1 IMPLANT
BNDG ELASTIC 2INX 5YD STR LF (GAUZE/BANDAGES/DRESSINGS) ×1 IMPLANT
BNDG ESMARK 4X9 LF (GAUZE/BANDAGES/DRESSINGS) ×1 IMPLANT
CHLORAPREP W/TINT 26 (MISCELLANEOUS) ×1 IMPLANT
CORD BIPOLAR FORCEPS 12FT (ELECTRODE) ×1 IMPLANT
COVER BACK TABLE 60X90IN (DRAPES) ×1 IMPLANT
CUFF TOURN SGL QUICK 18X4 (TOURNIQUET CUFF) IMPLANT
CUFF TRNQT CYL 24X4X16.5-23 (TOURNIQUET CUFF) IMPLANT
DRAPE EXTREMITY T 121X128X90 (DISPOSABLE) ×1 IMPLANT
DRAPE SURG 17X23 STRL (DRAPES) ×1 IMPLANT
GAUZE SPONGE 4X4 12PLY STRL (GAUZE/BANDAGES/DRESSINGS) IMPLANT
GAUZE XEROFORM 1X8 LF (GAUZE/BANDAGES/DRESSINGS) ×1 IMPLANT
GLOVE BIO SURGEON STRL SZ7 (GLOVE) ×1 IMPLANT
GLOVE BIOGEL PI IND STRL 7.0 (GLOVE) ×1 IMPLANT
GOWN STRL REUS W/ TWL LRG LVL3 (GOWN DISPOSABLE) ×2 IMPLANT
NDL HYPO 25X1 1.5 SAFETY (NEEDLE) ×1 IMPLANT
NEEDLE HYPO 25X1 1.5 SAFETY (NEEDLE) ×1
NS IRRIG 1000ML POUR BTL (IV SOLUTION) ×1 IMPLANT
PACK BASIN DAY SURGERY FS (CUSTOM PROCEDURE TRAY) ×1 IMPLANT
SHEET MEDIUM DRAPE 40X70 STRL (DRAPES) ×1 IMPLANT
SUT ETHILON 4 0 PS 2 18 (SUTURE) ×1 IMPLANT
SUT VIC AB 4-0 PS2 18 (SUTURE) IMPLANT
SUT VICRYL RAPIDE 4/0 PS 2 (SUTURE) IMPLANT
SYR BULB EAR ULCER 3OZ GRN STR (SYRINGE) ×1 IMPLANT
SYR CONTROL 10ML LL (SYRINGE) ×1 IMPLANT
TOWEL GREEN STERILE FF (TOWEL DISPOSABLE) ×1 IMPLANT
UNDERPAD 30X36 HEAVY ABSORB (UNDERPADS AND DIAPERS) ×1 IMPLANT

## 2023-03-17 NOTE — Discharge Instructions (Addendum)
Donna Little, M.D. Hand Surgery  POST-OPERATIVE DISCHARGE INSTRUCTIONS   PRESCRIPTIONS: - You may have been given a prescription to be taken as directed for post-operative pain control.  You may also take over the counter ibuprofen/aleve and tylenol for pain. Take this as directed on the packaging. Do not exceed 3000 mg tylenol/acetaminophen in 24 hours.  Ibuprofen 600-800 mg (3-4) tablets by mouth every 6 hours as needed for pain.   OR  Aleve 2 tablets by mouth every 12 hours (twice daily) as needed for pain.   AND/OR  Tylenol 1000 mg (2 tablets) every 8 hours as needed for pain.  - Please use your pain medication carefully, as refills are limited and you may not be provided with one.  As stated above, please use over the counter pain medicine - it will also be helpful with decreasing your swelling.    ANESTHESIA: -After your surgery, post-surgical discomfort or pain is likely. This discomfort can last several days to a few weeks. At certain times of the day your discomfort may be more intense.   Did you receive a nerve block?   - A nerve block can provide pain relief for one hour to two days after your surgery. As long as the nerve block is working, you will experience little or no sensation in the area the surgeon operated on.  - As the nerve block wears off, you will begin to experience pain or discomfort. It is very important that you begin taking your prescribed pain medication before the nerve block fully wears off. Treating your pain at the first sign of the block wearing off will ensure your pain is better controlled and more tolerable when full-sensation returns. Do not wait until the pain is intolerable, as the medicine will be less effective. It is better to treat pain in advance than to try and catch up.   General Anesthesia:  If you did not receive a nerve block during your surgery, you will need to start taking your pain medication shortly after your surgery and  should continue to do so as prescribed by your surgeon.     ICE AND ELEVATION: - You may use ice for the first 48-72 hours, but it is not critical.   - Motion of your fingers is very important to decrease the swelling.  - Elevation, as much as possible for the next 48 hours, is critical for decreasing swelling as well as for pain relief. Elevation means when you are seated or lying down, you hand should be at or above your heart. When walking, the hand needs to be at or above the level of your elbow.  - If the bandage gets too tight, it may need to be loosened. Please contact our office and we will instruct you in how to do this.    SURGICAL BANDAGES:  - Keep your dressing and/or splint clean and dry at all times.  You can remove your dressing 7 days from now and change with a dry dressing or Band-Aids as needed thereafter. - You may place a plastic bag over your bandage to shower, but be careful, do not get your bandages wet.  - After the bandages have been removed, it is OK to get the stitches wet in a shower or with hand washing. Do Not soak or submerge the wound yet. Please do not use lotions or creams on the stitches.      HAND THERAPY:  - You may not need any. If you  do, we will begin this at your follow up visit in the clinic.    ACTIVITY AND WORK: - You are encouraged to move any fingers which are not in the bandage.  - Light use of the fingers is allowed to assist the other hand with daily hygiene and eating, but strong gripping or lifting is often uncomfortable and should be avoided.  - You might miss a variable period of time from work and hopefully this issue has been discussed prior to surgery. You may not do any heavy work with your affected hand for about 2 weeks.    [facility] Second Floor, [address] [place], [place] [address] [phone]    My take Tylenol after 3 pm, if needed.    Post Anesthesia Home Care Instructions  Activity: Get plenty of  rest for the remainder of the day. A responsible individual must stay with you for 24 hours following the procedure.  For the next 24 hours, DO NOT: -Drive a car -Advertising copywriter -Drink alcoholic beverages -Take any medication unless instructed by your physician -Make any legal decisions or sign important papers.  Meals: Start with liquid foods such as gelatin or soup. Progress to regular foods as tolerated. Avoid greasy, spicy, heavy foods. If nausea and/or vomiting occur, drink only clear liquids until the nausea and/or vomiting subsides. Call your physician if vomiting continues.  Special Instructions/Symptoms: Your throat may feel dry or sore from the anesthesia or the breathing tube placed in your throat during surgery. If this causes discomfort, gargle with warm salt water. The discomfort should disappear within 24 hours.  If you had a scopolamine patch placed behind your ear for the management of post- operative nausea and/or vomiting:  1. The medication in the patch is effective for 72 hours, after which it should be removed.  Wrap patch in a tissue and discard in the trash. Wash hands thoroughly with soap and water. 2. You may remove the patch earlier than 72 hours if you experience unpleasant side effects which may include dry mouth, dizziness or visual disturbances. 3. Avoid touching the patch. Wash your hands with soap and water after contact with the patch.

## 2023-03-17 NOTE — Op Note (Signed)
   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  Risks, benefits, and alternatives to surgery were again discussed with the patient in the preoperative area. The patient wishes to proceed with surgery.  Informed consent was signed after our discussion.   PREOPERATIVE DIAGNOSIS:  Right middle finger stenosing tenosynovitis Right ring finger stenosing tenosynovitis  POSTOPERATIVE DIAGNOSIS: Same.  PROCEDURE:  Right middle finger A1 pulley release Right ring finger A1 pulley release Right middle finger flexor tenosynovectomy   SURGEON: Waylan Rocher, M.D.  ASSIST: None  ANESTHESIA:  Local, MAC  IV FLUIDS AND URINE: See anesthesia.  ESTIMATED BLOOD LOSS: <5 mL.  IMPLANTS: * No implants in log *   DRAINS: None  COMPLICATIONS: None  DESCRIPTION OF PROCEDURE: The patient was met in the preoperative holding area where the surgical site was marked and the informed consent form was signed.  The patient was then brought back to the operating room and remained on the stretcher.  A hand table was placed adjacent to the operative extremity and locked into place.  A tourniquet was placed on the right forearm.  A formal timeout was performed to confirm that this was the correct patient, surgical side, surgical site, and surgical procedure.  All were present and in agreement. Following formal timeout, a local block was performed using 10 mL of 0.25% plain marcaine.  The right upper extremity was then prepped and draped in the usual and sterile fashion.   The limb was exsanguinated and the tourniquet inflated to 250 mmHg.  I began with the ring finger.  A longitudinal incision was made over the A1 pulley.  The skin was incised.  Blunt dissection was used to identify the A1 pulley.  Two Ragnell retractors were placed on  the radial and ulnar sides of the pulley to protect the respective neurovascular bundles.  A third Ragnell was placed at the distal aspect of the wound.  The A1 pulley was clearly identified.  Under direct visualization, the pulley was entered sharply using a 15 blade.  Tenotomy scissors were used to complete the pulley release distally to the level of the A2 pulley.  The distal retractor was then placed in the proximal aspect of the wound.  Under direct visualization, the proximal aspect of the A1 pulley was completely released.   The same procedure was then performed for the middle finger.  Unlike the ring finger, however, there was extensive inflammatory appearing synovial tissue around the profundus and sublimis tendons.  A tenosynovectomy was performed to remove this inflammatory tissue.  Following satisfactory A1 pulley releases, the tourniquet was let down and hemostasis achieved with direct pressure and bipolar electrocautery.  The wounds were then thoroughly irrigated.  They were closed using 4-0 vicryl rapide sutures in a horizontal mattress fashion.  The wound was dressed with xeroform, 4x4, and an ace wrap.   The patient was reversed from sedation. All counts were correct x 2 at the end of the procedure.  The patient was then taken to the PACU in stable condition.   POSTOPERATIVE PLAN: She will be discharged to home with appropriate pain medication and discharge instructions.  I'll see her back in 10-14 days for her first postop visit.   Waylan Rocher, MD 11:10 AM

## 2023-03-17 NOTE — Interval H&P Note (Signed)
History and Physical Interval Note:  03/17/2023 10:17 AM  Donna Little  has presented today for surgery, with the diagnosis of Acquired trigger finger of right middle finger.  The various methods of treatment have been discussed with the patient and family. After consideration of risks, benefits and other options for treatment, the patient has consented to  Procedure(s) with comments: right middle and ring finger RELEASE TRIGGER FINGER/A-1 PULLEY (Right) - mac and local as a surgical intervention.  The patient's history has been reviewed, patient examined, no change in status, stable for surgery.  I have reviewed the patient's chart and labs.  Questions were answered to the patient's satisfaction.     Burch Marchuk

## 2023-03-17 NOTE — Transfer of Care (Signed)
Immediate Anesthesia Transfer of Care Note  Patient: Donna Little  Procedure(s) Performed: right middle and ring finger RELEASE TRIGGER FINGER/A-1 PULLEY (Right: Hand)  Patient Location: PACU  Anesthesia Type:MAC  Level of Consciousness: sedated  Airway & Oxygen Therapy: Patient Spontanous Breathing  Post-op Assessment: Report given to RN and Post -op Vital signs reviewed and stable  Post vital signs: Reviewed and stable  Last Vitals:  Vitals Value Taken Time  BP 99/40 03/17/23 1105  Temp    Pulse 64 03/17/23 1112  Resp 11 03/17/23 1112  SpO2 92 % 03/17/23 1112  Vitals shown include unfiled device data.  Last Pain:  Vitals:   03/17/23 0857  TempSrc: Temporal  PainSc: 0-No pain      Patients Stated Pain Goal: 3 (03/17/23 0857)  Complications: No notable events documented.

## 2023-03-17 NOTE — Anesthesia Postprocedure Evaluation (Signed)
Anesthesia Post Note  Patient: Donna Little  Procedure(s) Performed: right middle and ring finger RELEASE TRIGGER FINGER/A-1 PULLEY (Right: Hand)     Patient location during evaluation: PACU Anesthesia Type: MAC Level of consciousness: awake Pain management: pain level controlled Vital Signs Assessment: post-procedure vital signs reviewed and stable Respiratory status: spontaneous breathing, nonlabored ventilation and respiratory function stable Cardiovascular status: stable and blood pressure returned to baseline Postop Assessment: no apparent nausea or vomiting Anesthetic complications: no Comments: Patient reports breathing at baseline. SpO2 92% on RA that improves to 95% when she takes a deep breath. Patient instructed on how to use incentive spirometer and given return precautions if breathing worsens and she becomes short of breath.   No notable events documented.  Last Vitals:  Vitals:   03/17/23 1206 03/17/23 1223  BP: (!) 108/52 (!) 116/53  Pulse: (!) 54 (!) 59  Resp: 12 16  Temp:  36.7 C  SpO2: 100% 92%    Last Pain:  Vitals:   03/17/23 1223  TempSrc: Temporal  PainSc: 0-No pain                 Linton Rump

## 2023-03-17 NOTE — H&P (Signed)
HAND SURGERY   HPI: Patient is a 77 y.o. female who presents with right middle and ring trigger fingers that has failed conservative management.  Patient denies any changes to their medical history or new systemic symptoms today.    Past Medical History:  Diagnosis Date   A-fib [facility])    Atherosclerosis of both carotid arteries 2022   noted per CT on 05/14/20   Chronic pain syndrome    Chronic systolic heart failure (HCC)    Depression    Emphysema of lung (HCC) 2022   noted per CT scan on 05/14/20   Generalized atherosclerosis    GERD (gastroesophageal reflux disease)    Hypertension    IBS (irritable bowel syndrome)    Melanoma (HCC)    Other specified nontoxic goiter    Palpitations    PMR (polymyalgia rheumatica) (HCC) 07/04/2020   Polymyalgia rheumatica (HCC)    Primary pulmonary hypertension (HCC)    Pulmonary embolism (HCC)    Stroke (HCC)    ministroke times 3 Right side weakness   Supraventricular tachycardia (HCC)    Type 2 diabetes mellitus (HCC)    diet controlled   Past Surgical History:  Procedure Laterality Date   ABDOMINAL HYSTERECTOMY     APPENDECTOMY     CATARACT EXTRACTION     CORONARY ANGIOPLASTY WITH STENT PLACEMENT  2021   KYPHOPLASTY  05/2020   L4-L5   Social History   Socioeconomic History   Marital status: Single    Spouse name: Not on file   Number of children: 2   Years of education: Not on file   Highest education level: Not on file  Occupational History   Occupation: retired  Tobacco Use   Smoking status: Every Day    Current packs/day: 0.20    Average packs/day: 0.4 packs/day for 100.0 years (35.0 ttl pk-yrs)    Types: Cigarettes   Smokeless tobacco: Never   Tobacco comments:    Smokes 1/2 pack per day  Vaping Use   Vaping status: Never Used  Substance and Sexual Activity   Alcohol use: No    Alcohol/week: 0.0 standard drinks of alcohol   Drug use: No   Sexual activity: Not Currently  Other Topics Concern   Not on  file  Social History Narrative   Not on file   Social Determinants of Health   Financial Resource Strain: Low Risk  (12/04/2022)   Received from [facility]   Overall Financial Resource Strain (CARDIA)    Difficulty of Paying Living Expenses: Not hard at all  Food Insecurity: No Food Insecurity (12/04/2022)   Received from [facility]   Hunger Vital Sign    Worried About Running Out of Food in the Last Year: Never true    Ran Out of Food in the Last Year: Never true  Transportation Needs: No Transportation Needs (12/04/2022)   Received from [facility] - Transportation    Lack of Transportation (Medical): No    Lack of Transportation (Non-Medical): No  Physical Activity: Inactive (03/16/2022)   Received from [facility], [facility]   Exercise Vital Sign    Days of Exercise per Week: 0 days    Minutes of Exercise per Session: 0 min  Stress: Stress Concern Present (03/16/2022)   Received from [facility] of Occupational Health - Occupational Stress Questionnaire    Feeling of Stress : To some extent  Social Connections: Socially Integrated (03/16/2022)   Received  from [facility], [facility]   Social Network    How would you rate your social network (family, work, friends)?: Good participation with social networks   Family History  Problem Relation Age of Onset   Heart disease Mother    Diabetes Mother    Hyperlipidemia Mother    Hypertension Mother    Stroke Mother    Heart attack Mother    Heart disease Brother    Heart attack Brother    Stroke Brother    Hypertension Brother    Hyperlipidemia Brother    - negative except otherwise stated in the family history section Allergies  Allergen Reactions   Codeine     "It made me crazy"   Macrobid [Nitrofurantoin] Nausea And Vomiting   Methocarbamol Other (See Comments)    sleepy   Sertraline Other (See Comments)    Insomnia    Tizanidine Other (See  Comments)    sleepy   Prior to Admission medications   Medication Sig Start Date End Date Taking? Authorizing Provider  acetaminophen (TYLENOL) 325 MG tablet Take by mouth. 05/09/20  Yes [provider]  albuterol (VENTOLIN HFA) 108 (90 Base) MCG/ACT inhaler Inhale 2 puffs into the lungs every 6 (six) hours as needed. 06/03/22  Yes Cox, Kirsten, MD  amLODipine (NORVASC) 5 MG tablet Take 1 tablet (5 mg total) by mouth daily. 11/25/22 11/25/23 Yes Cox, Kirsten, MD  atorvastatin (LIPITOR) 80 MG tablet Take 1 tablet (80 mg total) by mouth daily. 11/26/22  Yes Cox, Kirsten, MD  clopidogrel (PLAVIX) 75 MG tablet Take 1 tablet (75 mg total) by mouth daily. 01/15/23  Yes Cox, Kirsten, MD  dicyclomine (BENTYL) 20 MG tablet TAKE 1 TABLET BY MOUTH 4 TIMES DAILY BEFORE MEAL(S) AND AT BEDTIME 10/07/22  Yes Cox, Kirsten, MD  furosemide (LASIX) 40 MG tablet Take 2 tablets (80 mg total) by mouth 2 (two) times daily. 11/25/22  Yes Cox, Kirsten, MD  gabapentin (NEURONTIN) 800 MG tablet Take 1 tablet (800 mg total) by mouth 3 (three) times daily. 01/20/23  Yes Cox, Kirsten, MD  levETIRAcetam (KEPPRA) 500 MG tablet Take 1 tablet (500 mg total) by mouth 2 (two) times daily. 01/28/23  Yes Cox, Kirsten, MD  meclizine (ANTIVERT) 25 MG tablet Take 1 tablet (25 mg total) by mouth every 8 (eight) hours as needed for dizziness. 07/29/22  Yes Cox, Kirsten, MD  metoprolol succinate (TOPROL-XL) 25 MG 24 hr tablet Take 1 tablet by mouth daily. 12/31/22  Yes [provider]  morphine (MS CONTIN) 15 MG 12 hr tablet Take 1 tablet (15 mg total) by mouth every 12 (twelve) hours. 02/22/23  Yes Sirivol, Kristie Cowman, MD  pantoprazole (PROTONIX) 40 MG tablet Take 1 tablet (40 mg total) by mouth daily. 01/20/23  Yes Cox, Kirsten, MD  potassium chloride SA (KLOR-CON M) 20 MEQ tablet TAKE 1 TABLET BY MOUTH TWICE DAILY WITH A MEAL 12/23/22  Yes Cox, Kirsten, MD  Romosozumab-aqqg (EVENITY) 105 MG/1. SOSY injection Inject 210 mg into the  skin once.   Yes [provider]  DULoxetine (CYMBALTA) 60 MG capsule Take 1 capsule (60 mg total) by mouth daily. 12/11/22   Windell Moment, MD  Fluticasone-Umeclidin-Vilant (TRELEGY ELLIPTA) 100-62.5-25 MCG/ACT AEPB Inhale into the lungs.    [provider]  naloxone [facility]) nasal spray 4 mg/0.1 mL 1 spray (4 mg) intranasally into 1 nostril. Use a new Narcan(R) nasal spray for subsequent doses and administer into alternating nostrils. May repeat every 2 to 3 minutes 03/05/21  Cox, Kirsten, MD  nitroGLYCERIN (NITROSTAT) 0.4 MG SL tablet Place under the tongue.    [provider]  sennosides-docusate sodium (SENOKOT-S) 8.6-50 MG tablet Take 1 tablet by mouth daily.    [provider]   No results found. - Positive ROS: All other systems have been reviewed and were otherwise negative with the exception of those mentioned in the HPI and as above.  Physical Exam: General: No acute distress, resting comfortably Cardiovascular: BUE warm and well perfused, normal rate Respiratory: Normal WOB on RA Skin: Warm and dry Neurologic: Sensation intact distally Psychiatric: Patient is at baseline mood and affect  Right Upper Extremity  TTP over middle and ring finger A1 pulleys.  Palpable locking/catching of the middle finger.  Subtle locking of the ring finger.  Full and painless AROM of the remaining fingers.  SILT m/u/r distribution.  Hand warm and well perfused.    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   Plan: OR today for right middle and ring finger A1 pulley release. We again reviewed the risks of surgery which include bleeding, infection, damage to neurovascular structures, persistent symptoms, finger stiffness, persistent pain, need for additional surgery.  Informed consent was signed.  All questions were answered.   Marlyne Beards, M.D. [facility] 10:13 AM

## 2023-03-18 ENCOUNTER — Other Ambulatory Visit: Payer: Self-pay

## 2023-03-18 ENCOUNTER — Encounter (HOSPITAL_BASED_OUTPATIENT_CLINIC_OR_DEPARTMENT_OTHER): Payer: Self-pay | Admitting: Orthopedic Surgery

## 2023-03-18 NOTE — Telephone Encounter (Signed)
Copied from CRM [id number]. Topic: Clinical - Medication Refill >> Mar 18, 2023  8:58 AM Adelina Mings wrote: Most Recent Primary Care Visit:  Provider: Arman Bogus A  Department: [facility]  Visit Type: CLINICAL SUPPORT  Date: 03/10/2023  Medication: morphine (MS CONTIN) 15 MG 12 hr tablet  Has the patient contacted their pharmacy? No (Agent: If no, request that the patient contact the pharmacy for the refill. If patient does not wish to contact the pharmacy document the reason why and proceed with request.) (Agent: If yes, when and what did the pharmacy advise?)  Is this the correct pharmacy for this prescription? Yes If no, delete pharmacy and type the correct one.  This is the patient's preferred pharmacy:  [facility] [address], [place] - [address] [place] [address] Phone: [phone] Fax: [phone]   Has the prescription been filled recently? No  Is the patient out of the medication? No  Has the patient been seen for an appointment in the last year OR does the patient have an upcoming appointment? Yes  Can we respond through MyChart? No  Agent: Please be advised that Rx refills may take up to 3 business days. We ask that you follow-up with your pharmacy.

## 2023-03-19 MED ORDER — MORPHINE SULFATE ER 15 MG PO TBCR: 15.0000 mg | EXTENDED_RELEASE_TABLET | Freq: Two times a day (BID) | ORAL | 0 refills | Status: DC

## 2023-03-24 DIAGNOSIS — M797 Fibromyalgia: Secondary | ICD-10-CM | POA: Diagnosis not present

## 2023-03-24 DIAGNOSIS — E663 Overweight: Secondary | ICD-10-CM | POA: Diagnosis not present

## 2023-03-24 DIAGNOSIS — Z6826 Body mass index (BMI) 26.0-26.9, adult: Secondary | ICD-10-CM | POA: Diagnosis not present

## 2023-03-24 DIAGNOSIS — H16229 Keratoconjunctivitis sicca, not specified as Sjogren's, unspecified eye: Secondary | ICD-10-CM | POA: Diagnosis not present

## 2023-03-24 DIAGNOSIS — M1991 Primary osteoarthritis, unspecified site: Secondary | ICD-10-CM | POA: Diagnosis not present

## 2023-03-24 DIAGNOSIS — R5382 Chronic fatigue, unspecified: Secondary | ICD-10-CM | POA: Diagnosis not present

## 2023-03-24 DIAGNOSIS — M79641 Pain in right hand: Secondary | ICD-10-CM | POA: Diagnosis not present

## 2023-03-24 DIAGNOSIS — M79642 Pain in left hand: Secondary | ICD-10-CM | POA: Diagnosis not present

## 2023-03-24 DIAGNOSIS — M5459 Other low back pain: Secondary | ICD-10-CM | POA: Diagnosis not present

## 2023-03-25 ENCOUNTER — Other Ambulatory Visit: Payer: Self-pay | Admitting: Radiology

## 2023-03-25 NOTE — Telephone Encounter (Signed)
I see that this was sent in to [facility] on 03/19/2023. Patient notified. The patient stated that Donna Little is saying that they do not have this.

## 2023-03-29 ENCOUNTER — Other Ambulatory Visit: Payer: Self-pay | Admitting: Family Medicine

## 2023-04-06 ENCOUNTER — Telehealth: Payer: Self-pay

## 2023-04-06 NOTE — Telephone Encounter (Signed)
Called patient, patient appointment for evenity shot is okay.

## 2023-04-06 NOTE — Telephone Encounter (Signed)
 Copied from CRM [id number]. Topic: Clinical - Medical Advice >> Apr 06, 2023 10:07 AM Elle L wrote: Reason for CRM: The patient was unsure about her evenity  shot appointment. I scheduled it per her request but wanted to verify the appointment with her provider.

## 2023-04-12 ENCOUNTER — Ambulatory Visit: Payer: Medicare Other

## 2023-04-12 DIAGNOSIS — M810 Age-related osteoporosis without current pathological fracture: Secondary | ICD-10-CM

## 2023-04-12 DIAGNOSIS — R5382 Chronic fatigue, unspecified: Secondary | ICD-10-CM | POA: Insufficient documentation

## 2023-04-12 DIAGNOSIS — M1991 Primary osteoarthritis, unspecified site: Secondary | ICD-10-CM | POA: Insufficient documentation

## 2023-04-12 MED ORDER — ROMOSOZUMAB-AQQG 105 MG/1.17ML ~~LOC~~ SOSY
210.0000 mg | PREFILLED_SYRINGE | Freq: Once | SUBCUTANEOUS | Status: AC
  Administered 2023-04-12: 210 mg via SUBCUTANEOUS

## 2023-04-12 NOTE — Progress Notes (Addendum)
 Patient is in office today for a nurse visit for  Evenity injection . Patient Injection was given in the  Right deltoid. Patient tolerated injection well. One was injected in the Left deltoid as well.

## 2023-04-13 ENCOUNTER — Telehealth: Payer: Self-pay

## 2023-04-13 NOTE — Telephone Encounter (Signed)
   Patient: Donna Little  DOB: [birth year]  MRN: [id number]  Evenity  benefits verified by [employer] on: 04/12/2023 Coverage Available: Buy & Bill Prior authorization NOT REQUIRED Deductible: $0 of $257 met OOP Cost: $0 Admin Fee: $0 Benefit Details: Benefits subject to a $257.00 deductible ($0.00 met) and 20.0%% co-insurance for the administration and cost of Evenity .  For the Secondary MD Purchase access option, this is a Medicare Supplement Plan F and it covers the Medicare Part B deductible, co-insurance and 100% of the excess charges.   Patient started Evenity  01/27/2023 and should be transitioned to Prolia  in 01/2024.  Suzen CHRISTELLA Sharps, LPN

## 2023-04-14 DIAGNOSIS — M0600 Rheumatoid arthritis without rheumatoid factor, unspecified site: Secondary | ICD-10-CM | POA: Diagnosis not present

## 2023-04-14 DIAGNOSIS — H16229 Keratoconjunctivitis sicca, not specified as Sjogren's, unspecified eye: Secondary | ICD-10-CM | POA: Diagnosis not present

## 2023-04-14 DIAGNOSIS — M79642 Pain in left hand: Secondary | ICD-10-CM | POA: Diagnosis not present

## 2023-04-14 DIAGNOSIS — M79641 Pain in right hand: Secondary | ICD-10-CM | POA: Diagnosis not present

## 2023-04-14 DIAGNOSIS — Z6821 Body mass index (BMI) 21.0-21.9, adult: Secondary | ICD-10-CM | POA: Diagnosis not present

## 2023-04-14 DIAGNOSIS — M1991 Primary osteoarthritis, unspecified site: Secondary | ICD-10-CM | POA: Diagnosis not present

## 2023-04-14 DIAGNOSIS — M797 Fibromyalgia: Secondary | ICD-10-CM | POA: Diagnosis not present

## 2023-04-15 ENCOUNTER — Telehealth: Payer: Self-pay

## 2023-04-15 NOTE — Telephone Encounter (Signed)
 Called patient, made her aware of labs results and provider comments. Patient also asked about a thyroid  US  she had done in November she has not got results.  Made patient aware that I will call Holly this morning and request results.  Called [facility] and spoke to Devere, she stated she will get it read today

## 2023-04-15 NOTE — Telephone Encounter (Signed)
 Copied from CRM [id number]. Topic: Clinical - Lab/Test Results >> Apr 15, 2023  9:20 AM Adelina Mings wrote: Reason for CRM: Needing to know what's going on with results have a couple concerns

## 2023-04-22 ENCOUNTER — Other Ambulatory Visit: Payer: Self-pay

## 2023-04-22 MED ORDER — CLOPIDOGREL BISULFATE 75 MG PO TABS: 75.0000 mg | ORAL_TABLET | Freq: Every day | ORAL | 1 refills | Status: DC

## 2023-04-22 MED ORDER — MORPHINE SULFATE ER 15 MG PO TBCR: 15.0000 mg | EXTENDED_RELEASE_TABLET | Freq: Two times a day (BID) | ORAL | 0 refills | Status: DC

## 2023-04-24 ENCOUNTER — Other Ambulatory Visit: Payer: Self-pay | Admitting: Family Medicine

## 2023-04-26 ENCOUNTER — Other Ambulatory Visit: Payer: Self-pay

## 2023-04-26 MED ORDER — MORPHINE SULFATE ER 15 MG PO TBCR: 15.0000 mg | EXTENDED_RELEASE_TABLET | Freq: Two times a day (BID) | ORAL | 0 refills | Status: DC

## 2023-04-28 DIAGNOSIS — M0600 Rheumatoid arthritis without rheumatoid factor, unspecified site: Secondary | ICD-10-CM | POA: Diagnosis not present

## 2023-05-21 ENCOUNTER — Other Ambulatory Visit: Payer: Self-pay | Admitting: Family Medicine

## 2023-05-21 ENCOUNTER — Telehealth: Payer: Self-pay | Admitting: Family Medicine

## 2023-05-21 DIAGNOSIS — I110 Hypertensive heart disease with heart failure: Secondary | ICD-10-CM

## 2023-05-21 MED ORDER — MORPHINE SULFATE ER 15 MG PO TBCR: 15.0000 mg | EXTENDED_RELEASE_TABLET | Freq: Two times a day (BID) | ORAL | 0 refills | Status: DC

## 2023-05-21 MED ORDER — AMLODIPINE BESYLATE 2.5 MG PO TABS: 2.5000 mg | ORAL_TABLET | Freq: Every day | ORAL | 0 refills | Status: DC

## 2023-05-21 NOTE — Telephone Encounter (Signed)
Copied from CRM [id number]. Topic: Clinical - Prescription Issue >> May 21, 2023 11:35 AM Antony Haste wrote: Reason for CRM: PT is requesting Amlodipine as 2.5 MG instead of 5 MG, she states she has a hard time splitting the pill in half. She is wanting to know if this can be sent over to her pharmacy today?

## 2023-05-21 NOTE — Telephone Encounter (Signed)
Copied from CRM [id number]. Topic: Clinical - Medication Refill >> May 21, 2023 11:31 AM Antony Haste wrote: Most Recent Primary Care Visit:  Provider: Danny Lawless  Department: [facility]  Visit Type: CLINICAL SUPPORT  Date: 04/12/2023  Medication: morphine (MS CONTIN) 15 MG 12 hr tablet amLODipine (NORVASC) 5 MG tablet    Has the patient contacted their pharmacy? Yes (Agent: If no, request that the patient contact the pharmacy for the refill. If patient does not wish to contact the pharmacy document the reason why and proceed with request.) (Agent: If yes, when and what did the pharmacy advise?)  Is this the correct pharmacy for this prescription? Yes If no, delete pharmacy and type the correct one.  This is the patient's preferred pharmacy:  [facility] [address] [place] - [address] [place] [address] Phone: [phone] Fax: [phone]   Has the prescription been filled recently? No  Is the patient out of the medication? Yes  Has the patient been seen for an appointment in the last year OR does the patient have an upcoming appointment? Yes  Can we respond through MyChart? No  Agent: Please be advised that Rx refills may take up to 3 business days. We ask that you follow-up with your pharmacy.

## 2023-05-21 NOTE — Telephone Encounter (Signed)
Refill pended for Dr. Sedalia Muta to send.

## 2023-05-24 ENCOUNTER — Ambulatory Visit (INDEPENDENT_AMBULATORY_CARE_PROVIDER_SITE_OTHER): Payer: Medicare Other

## 2023-05-24 DIAGNOSIS — M810 Age-related osteoporosis without current pathological fracture: Secondary | ICD-10-CM | POA: Diagnosis not present

## 2023-05-24 MED ORDER — ROMOSOZUMAB-AQQG 105 MG/1.17ML ~~LOC~~ SOSY: 210.0000 mg | PREFILLED_SYRINGE | Freq: Once | SUBCUTANEOUS | Status: DC

## 2023-05-24 MED ORDER — ROMOSOZUMAB-AQQG 105 MG/1.17ML ~~LOC~~ SOSY
210.0000 mg | PREFILLED_SYRINGE | Freq: Once | SUBCUTANEOUS | Status: AC
  Administered 2023-05-24: 210 mg via SUBCUTANEOUS

## 2023-05-24 NOTE — Progress Notes (Signed)
   Patient: Donna Little  DOB: 08-03-1945  MRN: [id number]    Visit Date: 05/24/2023    Vanessa Ralphs presents today for her monthly Evenity injection.  2 x 105 mg/1.17 ml Single-Dose Prefilled Syringes were given SQ, one in each arm.  Patient tolerated the injection well and has no questions.  The next Evenity injections will be due in one month.  Administrations This Visit     Romosozumab-aqqg (EVENITY) 105 MG/1. injection 210 mg     Admin Date 05/24/2023 Action Given Dose 210 mg Route Subcutaneous Documented By Precious Reel, CMA              Precious Reel, CMA

## 2023-05-25 ENCOUNTER — Other Ambulatory Visit: Payer: Self-pay

## 2023-05-25 ENCOUNTER — Ambulatory Visit: Payer: Self-pay | Admitting: Family Medicine

## 2023-05-25 ENCOUNTER — Telehealth: Payer: Self-pay

## 2023-05-25 ENCOUNTER — Other Ambulatory Visit: Payer: Self-pay | Admitting: Family Medicine

## 2023-05-25 MED ORDER — MORPHINE SULFATE ER 15 MG PO TBCR: 15.0000 mg | EXTENDED_RELEASE_TABLET | Freq: Two times a day (BID) | ORAL | 0 refills | Status: DC

## 2023-05-25 NOTE — Telephone Encounter (Signed)
Copied from CRM [id number]. Topic: Clinical - Prescription Issue >> May 25, 2023  1:53 PM Martha Clan wrote: Reason for CRM: Patient needs prescription for morphine (MS CONTIN) 15 MG 12 hr tablet [[id number]] sent to the [facility] on [address] [place], [place] [address]. The [facility] pharmacy cannot dispense the medication.

## 2023-05-25 NOTE — Telephone Encounter (Signed)
Patient informed to call other pharmacies to see of they have the medication in stock then to call our office to let us know where to send it or she can utilize mychart to let us know.  Copied from CRM [id number]. Topic: Clinical - Prescription Issue >> May 24, 2023  5:49 PM Donna Little wrote: Reason for CRM: Pt stated pharmacy will does not and does not know when they will receive morphine (MS CONTIN) 15 MG 12 hr tablet. Please call into another pharmacy. Please call pt  [phone]

## 2023-05-25 NOTE — Telephone Encounter (Signed)
Copied from CRM [id number]. Topic: Clinical - Medication Refill >> May 25, 2023 11:15 AM Bobbye Morton wrote: Most Recent Primary Care Visit:  Provider: Precious Reel  Department: [facility]  Visit Type: CLINICAL SUPPORT  Date: 05/24/2023  Medication: morphine (MS CONTIN) 15 MG 12 hr tablet  Has the patient contacted their pharmacy? Yes (Agent: If no, request that the patient contact the pharmacy for the refill. If patient does not wish to contact the pharmacy document the reason why and proceed with request.) (Agent: If yes, when and what did the pharmacy advise?)  Is this the correct pharmacy for this prescription? No If no, delete pharmacy and type the correct one.  This is the patient's preferred pharmacy:  [facility] [phone] of [address] [place], [place] [address]    Has the prescription been filled recently? Yes, was sent to pharmacy with no refills. Requesting to be sent o another pharmacy.   Is the patient out of the medication? Yes, pt has not been able to pick up  Has the patient been seen for an appointment in the last year OR does the patient have an upcoming appointment? Yes  Can we respond through MyChart? No  Agent: Please be advised that Rx refills may take up to 3 business days. We ask that you follow-up with your pharmacy.

## 2023-05-26 ENCOUNTER — Ambulatory Visit: Payer: Medicare Other | Admitting: Family Medicine

## 2023-06-02 DIAGNOSIS — M0600 Rheumatoid arthritis without rheumatoid factor, unspecified site: Secondary | ICD-10-CM | POA: Diagnosis not present

## 2023-06-03 MED ORDER — ROMOSOZUMAB-AQQG 105 MG/1.17ML ~~LOC~~ SOSY
210.0000 mg | PREFILLED_SYRINGE | SUBCUTANEOUS | Status: AC
  Administered 2023-06-24: 210 mg via SUBCUTANEOUS

## 2023-06-03 NOTE — Addendum Note (Signed)
 Addended by: Jacklynn Bue on: 06/03/2023 01:09 PM   Modules accepted: Orders

## 2023-06-07 NOTE — Progress Notes (Unsigned)
 Subjective:  Patient ID: Donna Little, female    DOB: Oct 14, 1945  Age: 78 y.o. MRN: [id number]  Chief Complaint  Patient presents with   Medical Management of Chronic Issues    HPI   Hyperlipidemia:  Patient is currently taking atorvastatin 40 mg 1 tablet daily (out of one week ago), plavix 75 mg , lasix 80 mg twice daily.   Depression:  Patient is currently taking Cymbalta 60 mg take 1 tablet daily.   GERD: Patient is currently taking Pantoprazole 40 mg take 1 tablet daily.   Chronic Pain Syndrome: Patient has chronic pain related to her entire back.  4/10. Patient has osteoporosis and has had compression fractures.  She is on gabapentin 800 mg 3 times daily, lidoderm patches, and MS Contin 15 mg 3 times daily.  Patient is not candidate for surgery. Has narcan.   HTN: Norvasc 5 mg daily and lasix 40 mg 2 tablets twice daily.      11/25/2022    2:09 PM 06/03/2022   11:43 AM 04/22/2022   10:34 AM 03/18/2022   10:17 AM 12/11/2021   11:47 AM  Depression screen PHQ 2/9  Decreased Interest 2 3 3 2  0  Down, Depressed, Hopeless 2 3 3 2 2   PHQ - 2 Score 4 6 6 4 2   Altered sleeping 2 3 3 1 2   Tired, decreased energy 2 3 3 1 2   Change in appetite 2 3 3  0 1  Feeling bad or failure about yourself  0 0 3 0 2  Trouble concentrating 0 0 3 0 0  Moving slowly or fidgety/restless 2 0 3 0 0  Suicidal thoughts 0 0 0 0 0  PHQ-9 Score 12 15 24 6 9   Difficult doing work/chores Somewhat difficult Very difficult Extremely dIfficult  Somewhat difficult        11/25/2022    2:09 PM  Fall Risk   Falls in the past year? 1  Number falls in past yr: 1  Injury with Fall? 1  Risk for fall due to : Impaired balance/gait;History of fall(s)  Follow up Falls evaluation completed;Education provided;Falls prevention discussed    Patient Care Team: Blane Ohara, MD as PCP - General (Family Medicine) Beverely Pace, Osborn Coho, MD as Referring Physician (Cardiology) Webb Silversmith, MD  (Gastroenterology) Drema Dallas, DO as Consulting Physician (Neurology) Zenovia Jordan, MD as Consulting Physician (Rheumatology) Leone Payor., MD (Surgery) Powers, Patrick North, MD as Referring Physician (Neurosurgery) Prescilla Sours, FNP as Nurse Practitioner (Urology) Zettie Pho, [facility] (Inactive) (Pharmacist)   Review of Systems  Current Outpatient Medications on File Prior to Visit  Medication Sig Dispense Refill   acetaminophen (TYLENOL) 325 MG tablet Take by mouth.     albuterol (VENTOLIN HFA) 108 (90 Base) MCG/ACT inhaler Inhale 2 puffs into the lungs every 6 (six) hours as needed. 18 g 1   amLODipine (NORVASC) 2.5 MG tablet Take 2.5 mg by mouth daily.     amLODipine (NORVASC) 2.5 MG tablet Take 1 tablet (2.5 mg total) by mouth daily. 90 tablet 0   atorvastatin (LIPITOR) 80 MG tablet Take 1 tablet (80 mg total) by mouth daily. 90 tablet 1   clopidogrel (PLAVIX) 75 MG tablet Take 1 tablet (75 mg total) by mouth daily. 90 tablet 1   dicyclomine (BENTYL) 20 MG tablet TAKE 1 TABLET BY MOUTH 4 TIMES DAILY BEFORE MEAL(S) AND AT BEDTIME 360 tablet 0   DULoxetine (CYMBALTA) 60 MG capsule Take 1 capsule (60 mg  total) by mouth daily. 90 capsule 0   Fluticasone-Umeclidin-Vilant (TRELEGY ELLIPTA) 100-62.5-25 MCG/ACT AEPB Inhale into the lungs.     furosemide (LASIX) 40 MG tablet Take 2 tablets (80 mg total) by mouth 2 (two) times daily. 360 tablet 1   gabapentin (NEURONTIN) 800 MG tablet Take 1 tablet (800 mg total) by mouth 3 (three) times daily. 270 tablet 1   levETIRAcetam (KEPPRA) 500 MG tablet Take 1 tablet (500 mg total) by mouth 2 (two) times daily. 60 tablet 5   meclizine (ANTIVERT) 25 MG tablet Take 1 tablet (25 mg total) by mouth every 8 (eight) hours as needed for dizziness. 30 tablet 2   metoprolol succinate (TOPROL-XL) 25 MG 24 hr tablet Take 1 tablet by mouth daily.     morphine (MS CONTIN) 15 MG 12 hr tablet Take 1 tablet (15 mg total) by mouth every 12 (twelve) hours. 60  tablet 0   naloxone (NARCAN) nasal spray 4 mg/0.1 mL 1 spray (4 mg) intranasally into 1 nostril. Use a new Narcan(R) nasal spray for subsequent doses and administer into alternating nostrils. May repeat every 2 to 3 minutes 2 each 1   nitroGLYCERIN (NITROSTAT) 0.4 MG SL tablet Place under the tongue.     pantoprazole (PROTONIX) 40 MG tablet Take 1 tablet (40 mg total) by mouth daily. 90 tablet 3   potassium chloride SA (KLOR-CON M) 20 MEQ tablet TAKE 1 TABLET BY MOUTH TWICE DAILY WITH A MEAL 180 tablet 0   Romosozumab-aqqg (EVENITY) 105 MG/1. SOSY injection Inject 210 mg into the skin once.     sennosides-docusate sodium (SENOKOT-S) 8.6-50 MG tablet Take 1 tablet by mouth daily.     Current Facility-Administered Medications on File Prior to Visit  Medication Dose Route Frequency Provider Last Rate Last Admin   [START ON 06/23/2023] Romosozumab-aqqg (EVENITY) 105 MG/1. injection 210 mg  210 mg Subcutaneous Q30 days [facility], Fritzi Mandes, MD       Past Medical History:  Diagnosis Date   A-fib [facility])    Atherosclerosis of both carotid arteries 2022   noted per CT on 05/14/20   Chronic pain syndrome    Chronic systolic heart failure (HCC)    Depression    Emphysema of lung (HCC) 2022   noted per CT scan on 05/14/20   Generalized atherosclerosis    GERD (gastroesophageal reflux disease)    Hypertension    IBS (irritable bowel syndrome)    Melanoma (HCC)    Other specified nontoxic goiter    Palpitations    PMR (polymyalgia rheumatica) (HCC) 07/04/2020   Polymyalgia rheumatica (HCC)    Primary pulmonary hypertension (HCC)    Pulmonary embolism (HCC)    Stroke (HCC)    ministroke times 3 Right side weakness   Supraventricular tachycardia (HCC)    Type 2 diabetes mellitus (HCC)    diet controlled   Past Surgical History:  Procedure Laterality Date   ABDOMINAL HYSTERECTOMY     APPENDECTOMY     CATARACT EXTRACTION     CORONARY ANGIOPLASTY WITH STENT PLACEMENT  2021   KYPHOPLASTY   05/2020   L4-L5   TRIGGER FINGER RELEASE Right 03/17/2023   Procedure: right middle and ring finger RELEASE TRIGGER FINGER/A-1 PULLEY;  Surgeon: Marlyne Beards, MD;  Location: [facility];  Service: Orthopedics;  Laterality: Right;  mac and local    Family History  Problem Relation Age of Onset   Heart disease Mother    Diabetes Mother    Hyperlipidemia Mother  Hypertension Mother    Stroke Mother    Heart attack Mother    Heart disease Brother    Heart attack Brother    Stroke Brother    Hypertension Brother    Hyperlipidemia Brother    Social History   Socioeconomic History   Marital status: Single    Spouse name: Not on file   Number of children: 2   Years of education: Not on file   Highest education level: Not on file  Occupational History   Occupation: retired  Tobacco Use   Smoking status: Every Day    Current packs/day: 0.20    Average packs/day: 0.4 packs/day for 100.0 years (35.0 ttl pk-yrs)    Types: Cigarettes   Smokeless tobacco: Never   Tobacco comments:    Smokes 1/2 pack per day  Vaping Use   Vaping status: Never Used  Substance and Sexual Activity   Alcohol use: No    Alcohol/week: 0.0 standard drinks of alcohol   Drug use: No   Sexual activity: Not Currently  Other Topics Concern   Not on file  Social History Narrative   Not on file   Social Drivers of Health   Financial Resource Strain: Low Risk  (12/04/2022)   Received from [facility]   Overall Financial Resource Strain (CARDIA)    Difficulty of Paying Living Expenses: Not hard at all  Food Insecurity: No Food Insecurity (12/04/2022)   Received from [facility]   Hunger Vital Sign    Worried About Running Out of Food in the Last Year: Never true    Ran Out of Food in the Last Year: Never true  Transportation Needs: No Transportation Needs (12/04/2022)   Received from [facility] - Transportation    Lack of Transportation (Medical): No    Lack of  Transportation (Non-Medical): No  Physical Activity: Inactive (03/16/2022)   Received from [facility], [facility]   Exercise Vital Sign    Days of Exercise per Week: 0 days    Minutes of Exercise per Session: 0 min  Stress: Stress Concern Present (03/16/2022)   Received from [facility], [facility] of [facility] - Occupational Stress Questionnaire    Feeling of Stress : To some extent  Social Connections: Socially Integrated (03/16/2022)   Received from [facility], [facility]   Social Network    How would you rate your social network (family, work, friends)?: Good participation with social networks    Objective:  LMP  (LMP Unknown)      03/17/2023   12:23 PM 03/17/2023   12:06 PM 03/17/2023   12:00 PM  BP/Weight  Systolic BP 116 108 101  Diastolic BP 53 52 60    Physical Exam  Diabetic Foot Exam - Simple   No data filed      Lab Results  Component Value Date   WBC 6.5 02/22/2023   HGB 13.8 02/22/2023   HCT 42.2 02/22/2023   PLT 222 02/22/2023   GLUCOSE 68 (L) 02/22/2023   CHOL 195 11/25/2022   TRIG 105 11/25/2022   HDL 48 11/25/2022   LDLCALC 128 (H) 11/25/2022   ALT 16 02/22/2023   AST 30 02/22/2023   NA 143 02/22/2023   K 4.2 02/22/2023   CL 101 02/22/2023   CREATININE 0.83 02/22/2023   BUN 11 02/22/2023   CO2 29 02/22/2023   TSH 1.020 11/25/2022   HGBA1C 6.4 (H) 02/22/2023   MICROALBUR 30  01/20/2020      Assessment & Plan:    Hypertensive heart disease with heart failure (HCC)  COPD mixed type (HCC)  Combined hyperlipidemia associated with type 2 diabetes mellitus (HCC)  Gastroesophageal reflux disease without esophagitis  Mixed hyperlipidemia  Uncomplicated opioid dependence (HCC)     No orders of the defined types were placed in this encounter.   No orders of the defined types were placed in this encounter.    Follow-up: No follow-ups on file.   I,Marla I Leal-Borjas,acting as a  scribe for Blane Ohara, MD.,have documented all relevant documentation on the behalf of Blane Ohara, MD,as directed by  Blane Ohara, MD while in the presence of Blane Ohara, MD.   An After Visit Summary was printed and given to the patient.  Blane Ohara, MD [facility] [phone]

## 2023-06-08 ENCOUNTER — Encounter: Payer: Self-pay | Admitting: Family Medicine

## 2023-06-08 ENCOUNTER — Ambulatory Visit: Payer: Medicare Other | Admitting: Family Medicine

## 2023-06-08 VITALS — BP 108/62 | HR 73 | Temp 97.6°F | Ht 61.5 in | Wt 127.0 lb

## 2023-06-08 DIAGNOSIS — I2583 Coronary atherosclerosis due to lipid rich plaque: Secondary | ICD-10-CM

## 2023-06-08 DIAGNOSIS — E1169 Type 2 diabetes mellitus with other specified complication: Secondary | ICD-10-CM

## 2023-06-08 DIAGNOSIS — Z720 Tobacco use: Secondary | ICD-10-CM

## 2023-06-08 DIAGNOSIS — F1120 Opioid dependence, uncomplicated: Secondary | ICD-10-CM

## 2023-06-08 DIAGNOSIS — M549 Dorsalgia, unspecified: Secondary | ICD-10-CM

## 2023-06-08 DIAGNOSIS — F17210 Nicotine dependence, cigarettes, uncomplicated: Secondary | ICD-10-CM

## 2023-06-08 DIAGNOSIS — E782 Mixed hyperlipidemia: Secondary | ICD-10-CM | POA: Diagnosis not present

## 2023-06-08 DIAGNOSIS — Z87891 Personal history of nicotine dependence: Secondary | ICD-10-CM

## 2023-06-08 DIAGNOSIS — M79641 Pain in right hand: Secondary | ICD-10-CM

## 2023-06-08 DIAGNOSIS — J449 Chronic obstructive pulmonary disease, unspecified: Secondary | ICD-10-CM | POA: Diagnosis not present

## 2023-06-08 DIAGNOSIS — R7303 Prediabetes: Secondary | ICD-10-CM | POA: Diagnosis not present

## 2023-06-08 DIAGNOSIS — I110 Hypertensive heart disease with heart failure: Secondary | ICD-10-CM | POA: Diagnosis not present

## 2023-06-08 DIAGNOSIS — F330 Major depressive disorder, recurrent, mild: Secondary | ICD-10-CM

## 2023-06-08 DIAGNOSIS — M5430 Sciatica, unspecified side: Secondary | ICD-10-CM

## 2023-06-08 DIAGNOSIS — K219 Gastro-esophageal reflux disease without esophagitis: Secondary | ICD-10-CM

## 2023-06-08 DIAGNOSIS — I2510 Atherosclerotic heart disease of native coronary artery without angina pectoris: Secondary | ICD-10-CM

## 2023-06-08 NOTE — Assessment & Plan Note (Signed)
Well controlled.  No changes to medicines. taking atorvastatin 40 mg 1 tablet daily  Continue to work on eating a healthy diet and exercise.  Labs drawn today.

## 2023-06-08 NOTE — Assessment & Plan Note (Signed)
 Well controlled.  No changes to medicines.  Amlodipine 5 mg daily and lasix 40 mg 2 tablets twice daily.  Continue to work on eating a healthy diet and exercise.  Labs drawn today.

## 2023-06-08 NOTE — Assessment & Plan Note (Addendum)
 Recommend cessation. Order CT chest lung cancer screening low dose.

## 2023-06-08 NOTE — Assessment & Plan Note (Signed)
>>  ASSESSMENT AND PLAN FOR TOBACCO ABUSE WRITTEN ON 06/12/2023 11:22 AM BY LEAL-BORJAS, Nuri Larmer I, CMA  Recommend cessation. Order CT chest lung cancer screening low dose.

## 2023-06-08 NOTE — Progress Notes (Signed)
 Subjective:  Patient ID: Donna Little, female    DOB: Jul 26, 1945  Age: 78 y.o. MRN: [id number]  Chief Complaint  Patient presents with   Medical Management of Chronic Issues   Discussed the use of AI scribe software for clinical note transcription with the patient, who gave verbal consent to proceed.  History of Present Illness   The patient, with a history of heart issues and a recent hand surgery, presents with multiple complaints. The patient reports persistent hand pain following surgery for trigger finger and Dupuytren's contracture. Despite physical therapy, the patient experiences pain on squeezing and limited finger mobility.  The patient also complains of significant back pain, for which she has been self-medicating with Aleve and Tylenol, up to four times a day. The patient acknowledges that this is too much.  The patient has noticed a change in her urinary habits, specifically the color of her urine, which she describes as a rusty Zeisler. Despite drinking ample fluids, this change persists.  The patient also reports visual disturbances, seeing large black shapes and movements in her peripheral vision that disappear upon direct gaze.  The patient mentions a history of heart issues and is currently on medication (Ranexa) for suspected heart blockage. The patient also reports breathing difficulties, particularly in the morning and when trying to move around.  The patient is a smoker and acknowledges the need to quit.      Hyperlipidemia:  Patient is currently taking atorvastatin 80 mg 1 tablet daily (out of one week ago), plavix 75 mg , lasix 80 mg twice daily.   Depression:  Patient is currently not taking Cymbalta 60 mg take 1 tablet daily, states it made her sleep from 9 pm to 11 a.m. would like to go back on celexa if possible.   GERD: Patient is currently taking Pantoprazole 40 mg take 1 tablet daily.   Chronic Pain Syndrome: Patient has chronic pain related to her  entire back.  4/10. Patient has osteoporosis and has had compression fractures.  She is on gabapentin 800 mg 3 times daily, lidoderm patches, and MS Contin 15 mg 3 times daily.  Patient is not candidate for surgery. Has narcan.  Patient states morphine at 15 mg is not strong enough  HTN: Norvasc 2.5 mg daily and lasix 40 mg 2 tablets twice daily.   Goes to [place] in [place]         06/08/2023    3:23 PM 11/25/2022    2:09 PM 06/03/2022   11:43 AM 04/22/2022   10:34 AM 03/18/2022   10:17 AM  Depression screen PHQ 2/9  Decreased Interest 2 2 3 3 2   Down, Depressed, Hopeless 2 2 3 3 2   PHQ - 2 Score 4 4 6 6 4   Altered sleeping 1 2 3 3 1   Tired, decreased energy 2 2 3 3 1   Change in appetite 2 2 3 3  0  Feeling bad or failure about yourself  0 0 0 3 0  Trouble concentrating 0 0 0 3 0  Moving slowly or fidgety/restless 0 2 0 3 0  Suicidal thoughts 0 0 0 0 0  PHQ-9 Score 9 12 15 24 6   Difficult doing work/chores Somewhat difficult Somewhat difficult Very difficult Extremely dIfficult         11/25/2022    2:09 PM  Fall Risk   Falls in the past year? 1  Number falls in past yr: 1  Injury with Fall? 1  Risk  for fall due to : Impaired balance/gait;History of fall(s)  Follow up Falls evaluation completed;Education provided;Falls prevention discussed    Patient Care Team: Blane Ohara, MD as PCP - General (Family Medicine) Beverely Pace, Osborn Coho, MD as Referring Physician (Cardiology) Webb Silversmith, MD (Gastroenterology) Drema Dallas, DO as Consulting Physician (Neurology) Zenovia Jordan, MD as Consulting Physician (Rheumatology) Leone Payor., MD (Surgery) Powers, Patrick North, MD as Referring Physician (Neurosurgery) Prescilla Sours, FNP as Nurse Practitioner (Urology) Zettie Pho, [facility] (Inactive) (Pharmacist)   Review of Systems  Constitutional:  Negative for chills, fatigue and fever.  HENT:  Negative for congestion, ear pain, rhinorrhea and sore throat.    Respiratory:  Negative for cough and shortness of breath.   Cardiovascular:  Negative for chest pain.  Gastrointestinal:  Negative for abdominal pain, constipation, diarrhea, nausea and vomiting.  Genitourinary:  Negative for dysuria and urgency.  Musculoskeletal:  Negative for back pain and myalgias.  Neurological:  Negative for dizziness, weakness, light-headedness and headaches.  Psychiatric/Behavioral:  Negative for dysphoric mood. The patient is not nervous/anxious.     Current Outpatient Medications on File Prior to Visit  Medication Sig Dispense Refill   acetaminophen (TYLENOL) 325 MG tablet Take by mouth.     albuterol (VENTOLIN HFA) 108 (90 Base) MCG/ACT inhaler Inhale 2 puffs into the lungs every 6 (six) hours as needed. 18 g 1   amLODipine (NORVASC) 2.5 MG tablet Take 1 tablet (2.5 mg total) by mouth daily. 90 tablet 0   atorvastatin (LIPITOR) 80 MG tablet Take 1 tablet (80 mg total) by mouth daily. 90 tablet 1   clopidogrel (PLAVIX) 75 MG tablet Take 1 tablet (75 mg total) by mouth daily. 90 tablet 1   dicyclomine (BENTYL) 20 MG tablet TAKE 1 TABLET BY MOUTH 4 TIMES DAILY BEFORE MEAL(S) AND AT BEDTIME 360 tablet 0   Fluticasone-Umeclidin-Vilant (TRELEGY ELLIPTA) 100-62.5-25 MCG/ACT AEPB Inhale into the lungs.     furosemide (LASIX) 40 MG tablet Take 2 tablets (80 mg total) by mouth 2 (two) times daily. 360 tablet 1   gabapentin (NEURONTIN) 800 MG tablet Take 1 tablet (800 mg total) by mouth 3 (three) times daily. 270 tablet 1   levETIRAcetam (KEPPRA) 500 MG tablet Take 1 tablet (500 mg total) by mouth 2 (two) times daily. 60 tablet 5   meclizine (ANTIVERT) 25 MG tablet Take 1 tablet (25 mg total) by mouth every 8 (eight) hours as needed for dizziness. 30 tablet 2   metoprolol succinate (TOPROL-XL) 25 MG 24 hr tablet Take 1 tablet by mouth daily.     morphine (MS CONTIN) 15 MG 12 hr tablet Take 1 tablet (15 mg total) by mouth every 12 (twelve) hours. 60 tablet 0   naloxone  (NARCAN) nasal spray 4 mg/0.1 mL 1 spray (4 mg) intranasally into 1 nostril. Use a new Narcan(R) nasal spray for subsequent doses and administer into alternating nostrils. May repeat every 2 to 3 minutes 2 each 1   nitroGLYCERIN (NITROSTAT) 0.4 MG SL tablet Place under the tongue.     pantoprazole (PROTONIX) 40 MG tablet Take 1 tablet (40 mg total) by mouth daily. 90 tablet 3   potassium chloride SA (KLOR-CON M) 20 MEQ tablet TAKE 1 TABLET BY MOUTH TWICE DAILY WITH A MEAL 180 tablet 0   Romosozumab-aqqg (EVENITY) 105 MG/1. SOSY injection Inject 210 mg into the skin once.     sennosides-docusate sodium (SENOKOT-S) 8.6-50 MG tablet Take 1 tablet by mouth daily.     Current  Facility-Administered Medications on File Prior to Visit  Medication Dose Route Frequency Provider Last Rate Last Admin   [START ON 06/23/2023] Romosozumab-aqqg (EVENITY) 105 MG/1. injection 210 mg  210 mg Subcutaneous Q30 days Ramia Sidney, Fritzi Mandes, MD       Past Medical History:  Diagnosis Date   A-fib [facility])    Atherosclerosis of both carotid arteries 2022   noted per CT on 05/14/20   Chronic pain syndrome    Chronic systolic heart failure (HCC)    Depression    Emphysema of lung (HCC) 2022   noted per CT scan on 05/14/20   Generalized atherosclerosis    GERD (gastroesophageal reflux disease)    Hypertension    IBS (irritable bowel syndrome)    Melanoma (HCC)    Other specified nontoxic goiter    Palpitations    PMR (polymyalgia rheumatica) (HCC) 07/04/2020   Polymyalgia rheumatica (HCC)    Primary pulmonary hypertension (HCC)    Pulmonary embolism (HCC)    Stroke (HCC)    ministroke times 3 Right side weakness   Supraventricular tachycardia (HCC)    Type 2 diabetes mellitus (HCC)    diet controlled   Past Surgical History:  Procedure Laterality Date   ABDOMINAL HYSTERECTOMY     APPENDECTOMY     CATARACT EXTRACTION     CORONARY ANGIOPLASTY WITH STENT PLACEMENT  2021   KYPHOPLASTY  05/2020   L4-L5    TRIGGER FINGER RELEASE Right 03/17/2023   Procedure: right middle and ring finger RELEASE TRIGGER FINGER/A-1 PULLEY;  Surgeon: Marlyne Beards, MD;  Location: [facility];  Service: Orthopedics;  Laterality: Right;  mac and local    Family History  Problem Relation Age of Onset   Heart disease Mother    Diabetes Mother    Hyperlipidemia Mother    Hypertension Mother    Stroke Mother    Heart attack Mother    Heart disease Brother    Heart attack Brother    Stroke Brother    Hypertension Brother    Hyperlipidemia Brother    Social History   Socioeconomic History   Marital status: Single    Spouse name: Not on file   Number of children: 2   Years of education: Not on file   Highest education level: Not on file  Occupational History   Occupation: retired  Tobacco Use   Smoking status: Every Day    Current packs/day: 0.20    Average packs/day: 0.4 packs/day for 100.0 years (35.0 ttl pk-yrs)    Types: Cigarettes   Smokeless tobacco: Never   Tobacco comments:    Smokes 1/2 pack per day  Vaping Use   Vaping status: Never Used  Substance and Sexual Activity   Alcohol use: No    Alcohol/week: 0.0 standard drinks of alcohol   Drug use: No   Sexual activity: Not Currently  Other Topics Concern   Not on file  Social History Narrative   Not on file   Social Drivers of Health   Financial Resource Strain: Low Risk  (06/08/2023)   Overall Financial Resource Strain (CARDIA)    Difficulty of Paying Living Expenses: Not hard at all  Food Insecurity: No Food Insecurity (06/08/2023)   Hunger Vital Sign    Worried About Running Out of Food in the Last Year: Never true    Ran Out of Food in the Last Year: Never true  Transportation Needs: No Transportation Needs (06/08/2023)   PRAPARE - Transportation    Lack  of Transportation (Medical): No    Lack of Transportation (Non-Medical): No  Physical Activity: Inactive (06/08/2023)   Exercise Vital Sign    Days of Exercise per  Week: 0 days    Minutes of Exercise per Session: 0 min  Stress: No Stress Concern Present (06/08/2023)   [employer] of Occupational Health - Occupational Stress Questionnaire    Feeling of Stress : Not at all  Social Connections: Socially Integrated (03/16/2022)   [facility] from [facility]   Social Network    How would you rate your social network (family, work, friends)?: Good participation with social networks    Objective:  BP 108/62   Pulse 73   Temp 97.6 F (36.4 C)   Ht 5' 1.5" (1.562 m)   Wt 127 lb (57.6 kg)   LMP  (LMP Unknown)   SpO2 93%   BMI 23.61 kg/m      06/08/2023    3:18 PM 03/17/2023   12:23 PM 03/17/2023   12:06 PM  BP/Weight  Systolic BP 108 116 108  Diastolic BP 62 53 52  Wt. (Lbs) 127    BMI 23.61 kg/m2      Physical Exam Vitals reviewed.  Constitutional:      Appearance: Normal appearance. She is normal weight.  Neck:     Vascular: No carotid bruit.  Cardiovascular:     Rate and Rhythm: Normal rate and regular rhythm.     Heart sounds: Normal heart sounds.  Pulmonary:     Effort: Pulmonary effort is normal. No respiratory distress.     Breath sounds: Normal breath sounds.  Abdominal:     General: Abdomen is flat. Bowel sounds are normal.     Palpations: Abdomen is soft.     Tenderness: There is no abdominal tenderness.  Musculoskeletal:        General: Tenderness (lumbar) present.  Neurological:     Mental Status: She is alert and oriented to person, place, and time.  Psychiatric:        Mood and Affect: Mood normal.        Behavior: Behavior normal.     Diabetic Foot Exam - Simple   No data filed      Lab Results  Component Value Date   WBC 7.0 06/08/2023   HGB 14.3 06/08/2023   HCT 43.5 06/08/2023   PLT 194 06/08/2023   GLUCOSE 72 06/08/2023   CHOL 128 06/08/2023   TRIG 111 06/08/2023   HDL 41 06/08/2023   LDLCALC 67 06/08/2023   ALT 12 06/08/2023   AST 28 06/08/2023   NA 139 06/08/2023   K 4.8  06/08/2023   CL 97 06/08/2023   CREATININE 1.40 (H) 06/08/2023   BUN 31 (H) 06/08/2023   CO2 24 06/08/2023   TSH 1.020 11/25/2022   HGBA1C 5.8 (H) 06/08/2023   MICROALBUR 30 01/20/2020      Assessment & Plan:  Hypertensive heart disease with heart failure (HCC) Assessment & Plan: Well controlled.  No changes to medicines.  Amlodipine 5 mg daily and lasix 40 mg 2 tablets twice daily.  Continue to work on eating a healthy diet and exercise.  Labs drawn today.    Orders: -     CBC with Differential/Platelet -     Comprehensive metabolic panel  COPD mixed type (HCC) Assessment & Plan: Ongoing breathing difficulties. Monthly infusions under Dr. Scharlene Gloss care. Discussed smoking cessation and lung cancer screening. - Order CT scan of the chest for  lung cancer screening. - Discuss smoking cessation strategies. - Continue Trelegy one inhalation daily.  Use albuterol four times a day as needed.  Recommend quit smoking.    Prediabetes Assessment & Plan: Recommend continue to work on eating healthy diet and exercise.    Orders: -     Hemoglobin A1c -     Microalbumin / creatinine urine ratio  Mixed hyperlipidemia Assessment & Plan: Well controlled.  No changes to medicines. taking atorvastatin 40 mg 1 tablet daily  Continue to work on eating a healthy diet and exercise.  Labs drawn today.    Orders: -     Lipid panel  Uncomplicated opioid dependence (HCC) Assessment & Plan: Order Urine drug test.  Orders: -     Pain Mgt Scrn (14 Drugs), Ur  Cigarette smoker Assessment & Plan: Recommend cessation. Order CT chest lung cancer screening low dose.   Back pain with sciatica Assessment & Plan: Continue with morphine 15 mg twice a day.   Mild recurrent major depression (HCC)  Right hand pain Assessment & Plan: Persistent pain post trigger finger release. Home physical therapy ongoing at home - Encourage follow-up with hand surgeon for further evaluation. -  Continue home physical therapy exercises.   Coronary artery disease due to lipid rich plaque Assessment & Plan: On Ranexa trial. Reviewed cardiology notes.  No celebrex due to CORONARY ARTERY DISEASE.     Personal history of tobacco use, presenting hazards to health        No orders of the defined types were placed in this encounter.   Orders Placed This Encounter  Procedures   CBC with Differential/Platelet   Comprehensive metabolic panel   Hemoglobin A1c   Lipid panel   Microalbumin / creatinine urine ratio   Pain Mgt Scrn (14 Drugs), Ur     Follow-up: Return in about 3 months (around 09/08/2023) for chronic follow up.   I,Katherina A Bramblett,acting as a scribe for Blane Ohara, MD.,have documented all relevant documentation on the behalf of Blane Ohara, MD,as directed by  Blane Ohara, MD while in the presence of Blane Ohara, MD.   Clayborn Bigness I Leal-Borjas,acting as a scribe for Blane Ohara, MD.,have documented all relevant documentation on the behalf of Blane Ohara, MD,as directed by  Blane Ohara, MD while in the presence of Blane Ohara, MD.    An After Visit Summary was printed and given to the patient.  I attest that I have reviewed this visit and agree with the plan scribed by my staff.   Blane Ohara, MD [facility] [phone]

## 2023-06-08 NOTE — Assessment & Plan Note (Addendum)
 Ongoing breathing difficulties. Monthly infusions under Dr. Scharlene Gloss care. Discussed smoking cessation and lung cancer screening. - Order CT scan of the chest for lung cancer screening. - Discuss smoking cessation strategies. - Continue Trelegy one inhalation daily.  Use albuterol four times a day as needed.  Recommend quit smoking.

## 2023-06-09 ENCOUNTER — Encounter: Payer: Self-pay | Admitting: Family Medicine

## 2023-06-09 LAB — COMPREHENSIVE METABOLIC PANEL
ALT: 12 IU/L (ref 0–32)
AST: 28 IU/L (ref 0–40)
Albumin: 4.1 g/dL (ref 3.8–4.8)
Alkaline Phosphatase: 235 IU/L — ABNORMAL HIGH (ref 44–121)
BUN/Creatinine Ratio: 22 (ref 12–28)
BUN: 31 mg/dL — ABNORMAL HIGH (ref 8–27)
Bilirubin Total: 0.2 mg/dL (ref 0.0–1.2)
CO2: 24 mmol/L (ref 20–29)
Calcium: 9 mg/dL (ref 8.7–10.3)
Chloride: 97 mmol/L (ref 96–106)
Creatinine, Ser: 1.4 mg/dL — ABNORMAL HIGH (ref 0.57–1.00)
Globulin, Total: 2.7 g/dL (ref 1.5–4.5)
Glucose: 72 mg/dL (ref 70–99)
Potassium: 4.8 mmol/L (ref 3.5–5.2)
Sodium: 139 mmol/L (ref 134–144)
Total Protein: 6.8 g/dL (ref 6.0–8.5)
eGFR: 38 mL/min/{1.73_m2} — ABNORMAL LOW (ref >59)

## 2023-06-09 LAB — LIPID PANEL
Chol/HDL Ratio: 3.1 ratio (ref 0.0–4.4)
Cholesterol, Total: 128 mg/dL (ref 100–199)
HDL: 41 mg/dL (ref >39)
LDL Chol Calc (NIH): 67 mg/dL (ref 0–99)
Triglycerides: 111 mg/dL (ref 0–149)
VLDL Cholesterol Cal: 20 mg/dL (ref 5–40)

## 2023-06-09 LAB — HEMOGLOBIN A1C
Est. average glucose Bld gHb Est-mCnc: 120 mg/dL
Hgb A1c MFr Bld: 5.8 % — ABNORMAL HIGH (ref 4.8–5.6)

## 2023-06-09 LAB — MICROALBUMIN / CREATININE URINE RATIO
Creatinine, Urine: 67.2 mg/dL
Microalb/Creat Ratio: 9 mg/g{creat} (ref 0–29)
Microalbumin, Urine: 6.2 ug/mL

## 2023-06-09 LAB — CBC WITH DIFFERENTIAL/PLATELET
Basophils Absolute: 0.1 10*3/uL (ref 0.0–0.2)
Basos: 1 %
EOS (ABSOLUTE): 0.5 10*3/uL — ABNORMAL HIGH (ref 0.0–0.4)
Eos: 7 %
Hematocrit: 43.5 % (ref 34.0–46.6)
Hemoglobin: 14.3 g/dL (ref 11.1–15.9)
Immature Grans (Abs): 0 10*3/uL (ref 0.0–0.1)
Immature Granulocytes: 0 %
Lymphocytes Absolute: 2.6 10*3/uL (ref 0.7–3.1)
Lymphs: 37 %
MCH: 32.1 pg (ref 26.6–33.0)
MCHC: 32.9 g/dL (ref 31.5–35.7)
MCV: 98 fL — ABNORMAL HIGH (ref 79–97)
Monocytes Absolute: 0.9 10*3/uL (ref 0.1–0.9)
Monocytes: 12 %
Neutrophils Absolute: 2.9 10*3/uL (ref 1.4–7.0)
Neutrophils: 43 %
Platelets: 194 10*3/uL (ref 150–450)
RBC: 4.46 x10E6/uL (ref 3.77–5.28)
RDW: 12.8 % (ref 11.7–15.4)
WBC: 7 10*3/uL (ref 3.4–10.8)

## 2023-06-10 ENCOUNTER — Other Ambulatory Visit: Payer: Self-pay

## 2023-06-10 DIAGNOSIS — I120 Hypertensive chronic kidney disease with stage 5 chronic kidney disease or end stage renal disease: Secondary | ICD-10-CM

## 2023-06-11 NOTE — Assessment & Plan Note (Addendum)
 Recommend continue to work on eating healthy diet and exercise.

## 2023-06-11 NOTE — Assessment & Plan Note (Signed)
 Continue pantoprazole 40 mg daily.  ?

## 2023-06-11 NOTE — Assessment & Plan Note (Addendum)
 Order Urine drug test.

## 2023-06-13 ENCOUNTER — Other Ambulatory Visit: Payer: Self-pay | Admitting: Family Medicine

## 2023-06-13 DIAGNOSIS — Z87891 Personal history of nicotine dependence: Secondary | ICD-10-CM | POA: Insufficient documentation

## 2023-06-13 DIAGNOSIS — M79641 Pain in right hand: Secondary | ICD-10-CM | POA: Insufficient documentation

## 2023-06-13 DIAGNOSIS — I2510 Atherosclerotic heart disease of native coronary artery without angina pectoris: Secondary | ICD-10-CM | POA: Insufficient documentation

## 2023-06-13 NOTE — Assessment & Plan Note (Signed)
Continue with morphine 15 mg twice a day.

## 2023-06-13 NOTE — Assessment & Plan Note (Signed)
 On Ranexa trial. Reviewed cardiology notes.  No celebrex due to CORONARY ARTERY DISEASE.

## 2023-06-13 NOTE — Assessment & Plan Note (Signed)
 Persistent pain post trigger finger release. Home physical therapy ongoing at home - Encourage follow-up with hand surgeon for further evaluation. - Continue home physical therapy exercises.

## 2023-06-18 DIAGNOSIS — N399 Disorder of urinary system, unspecified: Secondary | ICD-10-CM | POA: Diagnosis not present

## 2023-06-18 DIAGNOSIS — Z5321 Procedure and treatment not carried out due to patient leaving prior to being seen by health care provider: Secondary | ICD-10-CM | POA: Diagnosis not present

## 2023-06-18 DIAGNOSIS — R197 Diarrhea, unspecified: Secondary | ICD-10-CM | POA: Diagnosis not present

## 2023-06-18 LAB — LAB REPORT - SCANNED: EGFR: 80

## 2023-06-19 DIAGNOSIS — Z5321 Procedure and treatment not carried out due to patient leaving prior to being seen by health care provider: Secondary | ICD-10-CM | POA: Diagnosis not present

## 2023-06-19 DIAGNOSIS — R300 Dysuria: Secondary | ICD-10-CM | POA: Diagnosis not present

## 2023-06-19 DIAGNOSIS — R109 Unspecified abdominal pain: Secondary | ICD-10-CM | POA: Diagnosis not present

## 2023-06-19 DIAGNOSIS — R1030 Lower abdominal pain, unspecified: Secondary | ICD-10-CM | POA: Diagnosis not present

## 2023-06-19 DIAGNOSIS — N3000 Acute cystitis without hematuria: Secondary | ICD-10-CM | POA: Diagnosis not present

## 2023-06-20 DIAGNOSIS — R109 Unspecified abdominal pain: Secondary | ICD-10-CM | POA: Diagnosis not present

## 2023-06-20 DIAGNOSIS — R197 Diarrhea, unspecified: Secondary | ICD-10-CM | POA: Diagnosis not present

## 2023-06-20 DIAGNOSIS — Z5321 Procedure and treatment not carried out due to patient leaving prior to being seen by health care provider: Secondary | ICD-10-CM | POA: Diagnosis not present

## 2023-06-21 ENCOUNTER — Other Ambulatory Visit: Payer: Self-pay | Admitting: Family Medicine

## 2023-06-21 ENCOUNTER — Telehealth: Payer: Self-pay

## 2023-06-21 MED ORDER — ATORVASTATIN CALCIUM 80 MG PO TABS: 80.0000 mg | ORAL_TABLET | Freq: Every day | ORAL | 1 refills | Status: AC

## 2023-06-21 MED ORDER — MORPHINE SULFATE ER 15 MG PO TBCR: 15.0000 mg | EXTENDED_RELEASE_TABLET | Freq: Two times a day (BID) | ORAL | 0 refills | Status: DC

## 2023-06-21 NOTE — Telephone Encounter (Signed)
 Copied from CRM [id number]. Topic: Clinical - Medication Refill >> Jun 21, 2023 12:44 PM Everette C wrote: Most Recent Primary Care Visit:  [facility]: [facility]  [facility]  Visit Type: OFFICE VISIT  Date: 06/08/2023  Medication: morphine (MS CONTIN) 15 MG 12 hr tablet [[id number]]  atorvastatin (LIPITOR) 80 MG tablet [[id number]]  Has the patient contacted their pharmacy? No (Agent: If no, request that the patient contact the pharmacy for the refill. If patient does not wish to contact the pharmacy document the reason why and proceed with request.) (Agent: If yes, when and what did the pharmacy advise?)  Is this the correct pharmacy for this prescription? Yes If no, delete pharmacy and type the correct one.  This is the patient's preferred pharmacy:  [facility] [address] [place] - [address] [place] [address] Phone: [phone] Fax: [phone]  Has the prescription been filled recently? Yes  Is the patient out of the medication? Yes  Has the patient been seen for an appointment in the last year OR does the patient have an upcoming appointment? Yes  Can we respond through MyChart? No  Agent: Please be advised that Rx refills may take up to 3 business days. We ask that you follow-up with your pharmacy.

## 2023-06-21 NOTE — Transitions of Care (Post Inpatient/ED Visit) (Unsigned)
   06/21/2023  Name: Donna Little MRN: [id number] DOB: 09/04/1945  Today's TOC FU Call Status: Today's TOC FU Call Status:: Unsuccessful Call (1st Attempt) Unsuccessful Call (1st Attempt) Date: 06/21/23  Attempted to reach the patient regarding the most recent Inpatient/ED visit.  Follow Up Plan: Additional outreach attempts will be made to reach the patient to complete the Transitions of Care (Post Inpatient/ED visit) call.   Signature Karena Addison, LPN [facility] Nurse Health Advisor Direct Dial [phone]

## 2023-06-24 ENCOUNTER — Encounter: Payer: Self-pay | Admitting: Family Medicine

## 2023-06-24 ENCOUNTER — Telehealth: Payer: Self-pay | Admitting: Family Medicine

## 2023-06-24 ENCOUNTER — Other Ambulatory Visit (INDEPENDENT_AMBULATORY_CARE_PROVIDER_SITE_OTHER)

## 2023-06-24 DIAGNOSIS — N185 Chronic kidney disease, stage 5: Secondary | ICD-10-CM

## 2023-06-24 DIAGNOSIS — I120 Hypertensive chronic kidney disease with stage 5 chronic kidney disease or end stage renal disease: Secondary | ICD-10-CM | POA: Diagnosis not present

## 2023-06-24 DIAGNOSIS — M810 Age-related osteoporosis without current pathological fracture: Secondary | ICD-10-CM

## 2023-06-24 NOTE — Transitions of Care (Post Inpatient/ED Visit) (Signed)
   06/24/2023  Name: Donna Little MRN: [id number] DOB: Dec 11, 1945  Today's TOC FU Call Status: Today's TOC FU Call Status:: Unsuccessful Call (2nd Attempt) Unsuccessful Call (1st Attempt) Date: 06/21/23 Unsuccessful Call (2nd Attempt) Date: 06/24/23  Attempted to reach the patient regarding the most recent Inpatient/ED visit.  Follow Up Plan: No further outreach attempts will be made at this time. We have been unable to contact the patient. Patient already seen in office Signature Karena Addison, LPN [facility] Nurse Health Advisor Direct Dial [phone]

## 2023-06-24 NOTE — Progress Notes (Signed)
 Patient is in office today for a nurse visit for  Evenity injection . Patient injection was given in left deltoid and right deltoid. Patient tolerated injections well.

## 2023-06-24 NOTE — Telephone Encounter (Unsigned)
 Copied from CRM [id number]. Topic: General - Other >> Jun 24, 2023  8:38 AM Marlow Baars wrote: Reason for CRM: The patient called in to make sure her Morphine was called in. I told her it shows the pharmacy confirmed receipt on 3/17 at 5:33 PM. She will call the pharmacy >> Jun 24, 2023  9:57 AM Emylou G wrote: they don't have morphine slow release per the pharmacy.Marland Kitchen needs to be changed to something else.. the one requested is on back order.Marland Kitchen

## 2023-06-25 ENCOUNTER — Other Ambulatory Visit: Payer: Self-pay | Admitting: Family Medicine

## 2023-06-25 ENCOUNTER — Encounter: Payer: Self-pay | Admitting: Family Medicine

## 2023-06-25 LAB — CMP14+EGFR
ALT: 14 IU/L (ref 0–32)
AST: 29 IU/L (ref 0–40)
Albumin: 4.2 g/dL (ref 3.8–4.8)
Alkaline Phosphatase: 230 IU/L — ABNORMAL HIGH (ref 44–121)
BUN/Creatinine Ratio: 9 — ABNORMAL LOW (ref 12–28)
BUN: 7 mg/dL — ABNORMAL LOW (ref 8–27)
Bilirubin Total: 0.3 mg/dL (ref 0.0–1.2)
CO2: 21 mmol/L (ref 20–29)
Calcium: 8.9 mg/dL (ref 8.7–10.3)
Chloride: 104 mmol/L (ref 96–106)
Creatinine, Ser: 0.81 mg/dL (ref 0.57–1.00)
Globulin, Total: 2.2 g/dL (ref 1.5–4.5)
Glucose: 82 mg/dL (ref 70–99)
Potassium: 5 mmol/L (ref 3.5–5.2)
Sodium: 140 mmol/L (ref 134–144)
Total Protein: 6.4 g/dL (ref 6.0–8.5)
eGFR: 74 mL/min/{1.73_m2} (ref >59)

## 2023-06-25 NOTE — Telephone Encounter (Signed)
 Copied from CRM [id number]. Topic: Clinical - Medication Refill >> Jun 25, 2023  3:22 PM Eunice Blase wrote: Most Recent Primary Care Visit:  [facility]  [facility]  Visit Type: LAB  Date: 06/24/2023  Medication: morphine (MS CONTIN) 15 MG 12 hr tablet  Has the patient contacted their pharmacy? Yes (Agent: If no, request that the patient contact the pharmacy for the refill. If patient does not wish to contact the pharmacy document the reason why and proceed with request.) (Agent: If yes, when and what did the pharmacy advise?)Pharmacy needs script and approval  Is this the correct pharmacy for this prescription? Yes If no, delete pharmacy and type the correct one.  This is the patient's preferred pharmacy:  [facility] [address] [place] - [address] [place] [address] Phone: [phone] Fax: [phone]     Has the prescription been filled recently? Yes  Is the patient out of the medication? Yes  Has the patient been seen for an appointment in the last year OR does the patient have an upcoming appointment? Yes  Can we respond through MyChart? Yes  Agent: Please be advised that Rx refills may take up to 3 business days. We ask that you follow-up with your pharmacy.

## 2023-06-25 NOTE — Telephone Encounter (Signed)
 Patient called back stating that her Morphine needs to be called in to the [id number] - [place], [place] - [address]. The [facility] is short of the supply. Patient is in a lot of pain and needs this request expedited. Thank you

## 2023-06-25 NOTE — Telephone Encounter (Signed)
 Copied from CRM [id number]. Topic: Clinical - Medication Refill >> Jun 25, 2023  3:22 PM Eunice Blase wrote: Most Recent Primary Care Visit:  [facility]  [facility]  Visit Type: LAB  Date: 06/24/2023  Medication: morphine (MS CONTIN) 15 MG 12 hr tablet  Has the patient contacted their pharmacy? Yes (Agent: If no, request that the patient contact the pharmacy for the refill. If patient does not wish to contact the pharmacy document the reason why and proceed with request.) (Agent: If yes, when and what did the pharmacy advise?)Pharmacy needs script and approval  Is this the correct pharmacy for this prescription? Yes If no, delete pharmacy and type the correct one.  This is the patient's preferred pharmacy:  [facility] [address] [place] - [address] [place] [address] Phone: [phone] Fax: [phone]     Has the prescription been filled recently? Yes  Is the patient out of the medication? Yes  Has the patient been seen for an appointment in the last year OR does the patient have an upcoming appointment? Yes  Can we respond through MyChart? Yes  Agent: Please be advised that Rx refills may take up to 3 business days. We ask that you follow-up with your pharmacy. >> Jun 25, 2023  3:27 PM Eunice Blase wrote: Please send to : [facility] [address] [place], [place] [address] ph: [phone] fax:

## 2023-06-27 MED ORDER — MORPHINE SULFATE ER 15 MG PO TBCR
15.0000 mg | EXTENDED_RELEASE_TABLET | Freq: Two times a day (BID) | ORAL | 0 refills | Status: DC
Start: 2023-06-27 — End: 2023-06-28

## 2023-06-28 ENCOUNTER — Other Ambulatory Visit: Payer: Self-pay | Admitting: Family Medicine

## 2023-06-28 MED ORDER — MORPHINE SULFATE ER 15 MG PO TBCR: 15.0000 mg | EXTENDED_RELEASE_TABLET | Freq: Two times a day (BID) | ORAL | 0 refills | Status: DC

## 2023-07-09 ENCOUNTER — Ambulatory Visit: Admitting: Physician Assistant

## 2023-07-09 VITALS — BP 124/72 | HR 74 | Temp 98.2°F | Ht 61.5 in | Wt 125.0 lb

## 2023-07-09 DIAGNOSIS — K219 Gastro-esophageal reflux disease without esophagitis: Secondary | ICD-10-CM | POA: Diagnosis not present

## 2023-07-09 DIAGNOSIS — I679 Cerebrovascular disease, unspecified: Secondary | ICD-10-CM | POA: Diagnosis not present

## 2023-07-09 DIAGNOSIS — R413 Other amnesia: Secondary | ICD-10-CM

## 2023-07-09 DIAGNOSIS — F331 Major depressive disorder, recurrent, moderate: Secondary | ICD-10-CM

## 2023-07-09 DIAGNOSIS — S0990XA Unspecified injury of head, initial encounter: Secondary | ICD-10-CM | POA: Diagnosis not present

## 2023-07-09 MED ORDER — CITALOPRAM HYDROBROMIDE 10 MG PO TABS: 10.0000 mg | ORAL_TABLET | Freq: Every day | ORAL | 0 refills | Status: DC

## 2023-07-09 MED ORDER — PANTOPRAZOLE SODIUM 40 MG PO TBEC: 40.0000 mg | DELAYED_RELEASE_TABLET | Freq: Every day | ORAL | 3 refills | Status: DC

## 2023-07-09 MED ORDER — POTASSIUM CHLORIDE CRYS ER 20 MEQ PO TBCR: 20.0000 meq | EXTENDED_RELEASE_TABLET | Freq: Two times a day (BID) | ORAL | 0 refills | Status: DC

## 2023-07-09 NOTE — Progress Notes (Signed)
 Subjective:  Patient ID: Donna Little, female    DOB: 06-09-1945  Age: 78 y.o. MRN: [id number]  Chief Complaint  Patient presents with   Medical Management of Chronic Issues    Discussed the use of AI scribe software for clinical note transcription with the patient, who gave verbal consent to proceed.   Patient states she was taking a medication for anxiety that was changed over a year ago. States she is unable to take the medication, tells me she took it last week and "it was eating me alive".  Also states she fell last week at home and hit her head. Was confused and unable to recall what was going on. Symptoms have improved. No nausea. Did have dizziness.  Discussed the use of AI scribe software for clinical note transcription with the patient, who gave verbal consent to proceed.  History of Present Illness   The patient, with a past medical history of spinal cord injury and mini strokes, presents with symptoms following a fall. The patient reports experiencing vision changes, specifically in one eye, and describes seeing things run across the floor. These symptoms have been ongoing since the last fall. The patient also reports headaches, particularly in one area that has been sore. There is no reported increase in ear ringing since the fall.  The patient also describes a period of amnesia following the fall, during which she was disoriented and unable to remember events from a particular day. This has caused significant distress and concern for the patient. The patient also reports feeling dizzy and unwell on one occasion, which she managed with an anxiety medication. However, this medication caused severe itching, leading the patient to discontinue its use. The patient is requesting a change back to her previous anxiety medication.          06/08/2023    3:23 PM 11/25/2022    2:09 PM 06/03/2022   11:43 AM 04/22/2022   10:34 AM 03/18/2022   10:17 AM  Depression screen PHQ 2/9   Decreased Interest 2 2 3 3 2   Down, Depressed, Hopeless 2 2 3 3 2   PHQ - 2 Score 4 4 6 6 4   Altered sleeping 1 2 3 3 1   Tired, decreased energy 2 2 3 3 1   Change in appetite 2 2 3 3  0  Feeling bad or failure about yourself  0 0 0 3 0  Trouble concentrating 0 0 0 3 0  Moving slowly or fidgety/restless 0 2 0 3 0  Suicidal thoughts 0 0 0 0 0  PHQ-9 Score 9 12 15 24 6   Difficult doing work/chores Somewhat difficult Somewhat difficult Very difficult Extremely dIfficult         11/25/2022    2:09 PM  Fall Risk   Falls in the past year? 1  Number falls in past yr: 1  Injury with Fall? 1  Risk for fall due to : Impaired balance/gait;History of fall(s)  Follow up Falls evaluation completed;Education provided;Falls prevention discussed    Patient Care Team: Blane Ohara, MD as PCP - General (Family Medicine) Beverely Pace, Osborn Coho, MD as Referring Physician (Cardiology) Webb Silversmith, MD (Gastroenterology) Drema Dallas, DO as Consulting Physician (Neurology) Zenovia Jordan, MD as Consulting Physician (Rheumatology) Leone Payor., MD (Surgery) Powers, Patrick North, MD as Referring Physician (Neurosurgery) Prescilla Sours, FNP as Nurse Practitioner (Urology) Zettie Pho, [facility] (Inactive) (Pharmacist)   Review of Systems  Constitutional:  Negative for chills, fatigue and fever.  HENT:  Negative for congestion, ear pain and sore throat.   Respiratory:  Negative for cough and shortness of breath.   Cardiovascular:  Negative for chest pain and palpitations.  Gastrointestinal:  Negative for abdominal pain, constipation, diarrhea, nausea and vomiting.  Genitourinary:  Negative for difficulty urinating and dysuria.  Musculoskeletal:  Negative for arthralgias, back pain and myalgias.  Skin:  Negative for rash.  Neurological:  Positive for headaches. Negative for dizziness.  Psychiatric/Behavioral:  Positive for confusion. Negative for dysphoric mood.     Current Outpatient  Medications on File Prior to Visit  Medication Sig Dispense Refill   acetaminophen (TYLENOL) 325 MG tablet Take by mouth.     albuterol (VENTOLIN HFA) 108 (90 Base) MCG/ACT inhaler Inhale 2 puffs into the lungs every 6 (six) hours as needed. 18 g 1   amLODipine (NORVASC) 2.5 MG tablet Take 1 tablet (2.5 mg total) by mouth daily. 90 tablet 0   atorvastatin (LIPITOR) 80 MG tablet Take 1 tablet (80 mg total) by mouth daily. 90 tablet 1   clopidogrel (PLAVIX) 75 MG tablet Take 1 tablet (75 mg total) by mouth daily. 90 tablet 1   dicyclomine (BENTYL) 20 MG tablet TAKE 1 TABLET BY MOUTH 4 TIMES DAILY BEFORE MEAL(S) AND AT BEDTIME 360 tablet 0   Fluticasone-Umeclidin-Vilant (TRELEGY ELLIPTA) 100-62.5-25 MCG/ACT AEPB Inhale into the lungs.     furosemide (LASIX) 40 MG tablet Take 2 tablets (80 mg total) by mouth 2 (two) times daily. 360 tablet 1   gabapentin (NEURONTIN) 800 MG tablet Take 1 tablet (800 mg total) by mouth 3 (three) times daily. 270 tablet 1   levETIRAcetam (KEPPRA) 500 MG tablet Take 1 tablet (500 mg total) by mouth 2 (two) times daily. 60 tablet 5   meclizine (ANTIVERT) 25 MG tablet Take 1 tablet (25 mg total) by mouth every 8 (eight) hours as needed for dizziness. 30 tablet 2   metoprolol succinate (TOPROL-XL) 25 MG 24 hr tablet Take 1 tablet by mouth daily.     morphine (MS CONTIN) 15 MG 12 hr tablet Take 1 tablet (15 mg total) by mouth every 12 (twelve) hours. 60 tablet 0   naloxone (NARCAN) nasal spray 4 mg/0.1 mL 1 spray (4 mg) intranasally into 1 nostril. Use a new Narcan(R) nasal spray for subsequent doses and administer into alternating nostrils. May repeat every 2 to 3 minutes 2 each 1   nitroGLYCERIN (NITROSTAT) 0.4 MG SL tablet Place under the tongue.     Romosozumab-aqqg (EVENITY) 105 MG/1. SOSY injection Inject 210 mg into the skin once.     sennosides-docusate sodium (SENOKOT-S) 8.6-50 MG tablet Take 1 tablet by mouth daily.     No current facility-administered  medications on file prior to visit.   Past Medical History:  Diagnosis Date   A-fib [facility])    Atherosclerosis of both carotid arteries 2022   noted per CT on 05/14/20   Chronic pain syndrome    Chronic systolic heart failure (HCC)    Depression    Emphysema of lung (HCC) 2022   noted per CT scan on 05/14/20   Generalized atherosclerosis    GERD (gastroesophageal reflux disease)    Hypertension    IBS (irritable bowel syndrome)    Melanoma (HCC)    Other specified nontoxic goiter    Palpitations    PMR (polymyalgia rheumatica) (HCC) 07/04/2020   Polymyalgia rheumatica (HCC)    Primary pulmonary hypertension (HCC)    Pulmonary embolism (HCC)    Stroke (HCC)  ministroke times 3 Right side weakness   Supraventricular tachycardia (HCC)    Type 2 diabetes mellitus (HCC)    diet controlled   Past Surgical History:  Procedure Laterality Date   ABDOMINAL HYSTERECTOMY     APPENDECTOMY     CATARACT EXTRACTION     CORONARY ANGIOPLASTY WITH STENT PLACEMENT  2021   KYPHOPLASTY  05/2020   L4-L5   TRIGGER FINGER RELEASE Right 03/17/2023   Procedure: right middle and ring finger RELEASE TRIGGER FINGER/A-1 PULLEY;  Surgeon: Marlyne Beards, MD;  Location: [facility];  Service: Orthopedics;  Laterality: Right;  mac and local    Family History  Problem Relation Age of Onset   Heart disease Mother    Diabetes Mother    Hyperlipidemia Mother    Hypertension Mother    Stroke Mother    Heart attack Mother    Heart disease Brother    Heart attack Brother    Stroke Brother    Hypertension Brother    Hyperlipidemia Brother    Social History   Socioeconomic History   Marital status: Single    Spouse name: Not on file   Number of children: 2   Years of education: Not on file   Highest education level: Not on file  Occupational History   Occupation: retired  Tobacco Use   Smoking status: Every Day    Current packs/day: 0.20    Average packs/day: 0.4 packs/day  for 100.0 years (35.0 ttl pk-yrs)    Types: Cigarettes   Smokeless tobacco: Never   Tobacco comments:    Smokes 1/2 pack per day  Vaping Use   Vaping status: Never Used  Substance and Sexual Activity   Alcohol use: No    Alcohol/week: 0.0 standard drinks of alcohol   Drug use: No   Sexual activity: Not Currently  Other Topics Concern   Not on file  Social History Narrative   Not on file   Social Drivers of Health   Financial Resource Strain: Low Risk  (06/08/2023)   Overall Financial Resource Strain (CARDIA)    Difficulty of Paying Living Expenses: Not hard at all  Food Insecurity: No Food Insecurity (06/08/2023)   Hunger Vital Sign    Worried About Running Out of Food in the Last Year: Never true    Ran Out of Food in the Last Year: Never true  Transportation Needs: No Transportation Needs (06/08/2023)   PRAPARE - Administrator, Civil Service (Medical): No    Lack of Transportation (Non-Medical): No  Physical Activity: Inactive (06/08/2023)   Exercise Vital Sign    Days of Exercise per Week: 0 days    Minutes of Exercise per Session: 0 min  Stress: No Stress Concern Present (06/08/2023)   [employer] of Occupational Health - Occupational Stress Questionnaire    Feeling of Stress : Not at all  Social Connections: Socially Integrated (03/16/2022)   [facility] from [facility]   Social Network    How would you rate your social network (family, work, friends)?: Good participation with social networks    Objective:  BP 124/72   Pulse 74   Temp 98.2 F (36.8 C)   Ht 5' 1.5" (1.562 m)   Wt 125 lb (56.7 kg)   LMP  (LMP Unknown)   SpO2 94%   BMI 23.24 kg/m      07/09/2023   11:05 AM 06/08/2023    3:18 PM 03/17/2023   12:23 PM  BP/Weight  Systolic BP 124 108 116  Diastolic BP 72 62 53  Wt. (Lbs) 125 127   BMI 23.24 kg/m2 23.61 kg/m2     Physical Exam Vitals reviewed.  Constitutional:      Appearance: Normal appearance.  Eyes:      General: No scleral icterus.    Extraocular Movements: Extraocular movements intact.     Pupils: Pupils are equal, round, and reactive to light.  Neck:     Vascular: No carotid bruit.  Cardiovascular:     Rate and Rhythm: Normal rate and regular rhythm.     Heart sounds: Normal heart sounds.  Pulmonary:     Effort: Pulmonary effort is normal.     Breath sounds: Normal breath sounds.  Abdominal:     General: Bowel sounds are normal.     Palpations: Abdomen is soft.     Tenderness: There is no abdominal tenderness.  Skin:         Comments: Minor edema lighter in color showing normal healing with no sign of continuing bleeding or worsening bleed.   Neurological:     General: No focal deficit present.     Mental Status: She is alert and oriented to person, place, and time.     Cranial Nerves: No cranial nerve deficit.     Sensory: No sensory deficit.     Motor: No weakness.     Coordination: Coordination normal.  Psychiatric:        Mood and Affect: Mood normal.        Behavior: Behavior normal.     Diabetic Foot Exam - Simple   No data filed      Lab Results  Component Value Date   WBC 7.0 06/08/2023   HGB 14.3 06/08/2023   HCT 43.5 06/08/2023   PLT 194 06/08/2023   GLUCOSE 82 06/24/2023   CHOL 128 06/08/2023   TRIG 111 06/08/2023   HDL 41 06/08/2023   LDLCALC 67 06/08/2023   ALT 14 06/24/2023   AST 29 06/24/2023   NA 140 06/24/2023   K 5.0 06/24/2023   CL 104 06/24/2023   CREATININE 0.81 06/24/2023   BUN 7 (L) 06/24/2023   CO2 21 06/24/2023   TSH 1.020 11/25/2022   HGBA1C 5.8 (H) 06/08/2023   MICROALBUR 30 01/20/2020      Assessment & Plan:    Minor head trauma Assessment & Plan: Symptoms suggest concussion. No acute intracranial hemorrhage indicated by neurological exam and time since fall. Concern for amnesia and potential residual damage due to history of transient ischemic attacks and previous intracranial hemorrhage. - Order head CT to assess  for residual damage or pressure from possible bleed. - Advise to seek immediate medical attention if experiencing vision loss, ophthalmoplegia, or recurrent amnesia.  Orders: -     CT HEAD WO CONTRAST ( ); Future  Gastroesophageal reflux disease without esophagitis Assessment & Plan: Controlled Continue to monitor symptoms Will adjust treatment depending on symptoms  Orders: -     Pantoprazole Sodium; Take 1 tablet (40 mg total) by mouth daily.  Dispense: 90 tablet; Refill: 3  Memory loss Assessment & Plan: Symptoms suggest concussion. No acute intracranial hemorrhage indicated by neurological exam and time since fall. Concern for amnesia and potential residual damage due to history of transient ischemic attacks and previous intracranial hemorrhage. - Order head CT to assess for residual damage or pressure from possible bleed. - Advise to seek immediate medical attention if experiencing vision loss, ophthalmoplegia, or recurrent  amnesia.  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 No ophthalmologist visit in almost a year. - Advise to inform ophthalmologist about visual disturbances at next appointment.  Orders: -     Potassium Chloride Crys ER; Take 1 tablet (20 mEq total) by mouth 2 (two) times daily with a meal.  Dispense: 180 tablet; Refill: 0  Moderate recurrent major depression (HCC) Assessment & Plan: Controlled Continue to monitor symptoms Continue taking Celexa 10mg  Will adjust treatment depending on symptoms  Orders: -     Citalopram Hydrobromide; Take 1 tablet (10 mg total) by mouth daily.  Dispense: 30 tablet; Refill: 0     Meds ordered this encounter  Medications   pantoprazole (PROTONIX) 40 MG tablet    Sig: Take 1 tablet (40 mg total) by mouth daily.    Dispense:  90 tablet    Refill:  3   potassium chloride SA (KLOR-CON M)  20 MEQ tablet    Sig: Take 1 tablet (20 mEq total) by mouth 2 (two) times daily with a meal.    Dispense:  180 tablet    Refill:  0   citalopram (CELEXA) 10 MG tablet    Sig: Take 1 tablet (10 mg total) by mouth daily.    Dispense:  30 tablet    Refill:  0    Orders Placed This Encounter  Procedures   CT HEAD WO CONTRAST ( )        Follow-up: No follow-ups on file.   I,Katherina A Bramblett,acting as a scribe for [employer], PA.,have documented all relevant documentation on the behalf of Langley Gauss, PA,as directed by  Langley Gauss, PA while in the presence of Langley Gauss, [place].   An After Visit Summary was printed and given to the patient.  Langley Gauss, [place] [facility] [phone]

## 2023-07-14 DIAGNOSIS — M0600 Rheumatoid arthritis without rheumatoid factor, unspecified site: Secondary | ICD-10-CM | POA: Diagnosis not present

## 2023-07-14 DIAGNOSIS — M797 Fibromyalgia: Secondary | ICD-10-CM | POA: Diagnosis not present

## 2023-07-14 DIAGNOSIS — Z79899 Other long term (current) drug therapy: Secondary | ICD-10-CM | POA: Diagnosis not present

## 2023-07-14 DIAGNOSIS — Z6821 Body mass index (BMI) 21.0-21.9, adult: Secondary | ICD-10-CM | POA: Diagnosis not present

## 2023-07-14 DIAGNOSIS — H16229 Keratoconjunctivitis sicca, not specified as Sjogren's, unspecified eye: Secondary | ICD-10-CM | POA: Diagnosis not present

## 2023-07-14 DIAGNOSIS — M1991 Primary osteoarthritis, unspecified site: Secondary | ICD-10-CM | POA: Diagnosis not present

## 2023-07-15 ENCOUNTER — Encounter: Payer: Self-pay | Admitting: Physician Assistant

## 2023-07-15 DIAGNOSIS — S0990XA Unspecified injury of head, initial encounter: Secondary | ICD-10-CM | POA: Insufficient documentation

## 2023-07-15 NOTE — Assessment & Plan Note (Signed)
 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 No ophthalmologist visit in almost a year. - Advise to inform ophthalmologist about visual disturbances at next appointment.

## 2023-07-15 NOTE — Assessment & Plan Note (Signed)
 Controlled Continue to monitor symptoms Continue taking Celexa 10mg  Will adjust treatment depending on symptoms

## 2023-07-15 NOTE — Assessment & Plan Note (Signed)
 Controlled Continue to monitor symptoms Will adjust treatment depending on symptoms

## 2023-07-15 NOTE — Assessment & Plan Note (Signed)
 Symptoms suggest concussion. No acute intracranial hemorrhage indicated by neurological exam and time since fall. Concern for amnesia and potential residual damage due to history of transient ischemic attacks and previous intracranial hemorrhage. - Order head CT to assess for residual damage or pressure from possible bleed. - Advise to seek immediate medical attention if experiencing vision loss, ophthalmoplegia, or recurrent amnesia.

## 2023-07-16 DIAGNOSIS — R519 Headache, unspecified: Secondary | ICD-10-CM | POA: Diagnosis not present

## 2023-07-16 DIAGNOSIS — S0990XA Unspecified injury of head, initial encounter: Secondary | ICD-10-CM | POA: Diagnosis not present

## 2023-07-19 ENCOUNTER — Encounter: Payer: Self-pay | Admitting: Family Medicine

## 2023-07-19 DIAGNOSIS — R9431 Abnormal electrocardiogram [ECG] [EKG]: Secondary | ICD-10-CM | POA: Diagnosis not present

## 2023-07-19 DIAGNOSIS — Z87440 Personal history of urinary (tract) infections: Secondary | ICD-10-CM | POA: Diagnosis not present

## 2023-07-19 DIAGNOSIS — I2510 Atherosclerotic heart disease of native coronary artery without angina pectoris: Secondary | ICD-10-CM | POA: Diagnosis not present

## 2023-07-19 DIAGNOSIS — I454 Nonspecific intraventricular block: Secondary | ICD-10-CM | POA: Diagnosis not present

## 2023-07-19 DIAGNOSIS — E440 Moderate protein-calorie malnutrition: Secondary | ICD-10-CM | POA: Diagnosis present

## 2023-07-19 DIAGNOSIS — I739 Peripheral vascular disease, unspecified: Secondary | ICD-10-CM | POA: Diagnosis present

## 2023-07-19 DIAGNOSIS — Z8673 Personal history of transient ischemic attack (TIA), and cerebral infarction without residual deficits: Secondary | ICD-10-CM | POA: Diagnosis not present

## 2023-07-19 DIAGNOSIS — I4891 Unspecified atrial fibrillation: Secondary | ICD-10-CM | POA: Diagnosis not present

## 2023-07-19 DIAGNOSIS — E7800 Pure hypercholesterolemia, unspecified: Secondary | ICD-10-CM | POA: Diagnosis present

## 2023-07-19 DIAGNOSIS — R0602 Shortness of breath: Secondary | ICD-10-CM | POA: Diagnosis not present

## 2023-07-19 DIAGNOSIS — I361 Nonrheumatic tricuspid (valve) insufficiency: Secondary | ICD-10-CM | POA: Diagnosis not present

## 2023-07-19 DIAGNOSIS — J440 Chronic obstructive pulmonary disease with acute lower respiratory infection: Secondary | ICD-10-CM | POA: Diagnosis present

## 2023-07-19 DIAGNOSIS — K589 Irritable bowel syndrome without diarrhea: Secondary | ICD-10-CM | POA: Diagnosis present

## 2023-07-19 DIAGNOSIS — M549 Dorsalgia, unspecified: Secondary | ICD-10-CM | POA: Diagnosis present

## 2023-07-19 DIAGNOSIS — F419 Anxiety disorder, unspecified: Secondary | ICD-10-CM | POA: Diagnosis present

## 2023-07-19 DIAGNOSIS — J189 Pneumonia, unspecified organism: Secondary | ICD-10-CM | POA: Diagnosis present

## 2023-07-19 DIAGNOSIS — I471 Supraventricular tachycardia, unspecified: Secondary | ICD-10-CM | POA: Diagnosis not present

## 2023-07-19 DIAGNOSIS — F32A Depression, unspecified: Secondary | ICD-10-CM | POA: Diagnosis present

## 2023-07-19 DIAGNOSIS — Z85828 Personal history of other malignant neoplasm of skin: Secondary | ICD-10-CM | POA: Diagnosis not present

## 2023-07-19 DIAGNOSIS — Z885 Allergy status to narcotic agent status: Secondary | ICD-10-CM | POA: Diagnosis not present

## 2023-07-19 DIAGNOSIS — J9600 Acute respiratory failure, unspecified whether with hypoxia or hypercapnia: Secondary | ICD-10-CM | POA: Diagnosis not present

## 2023-07-19 DIAGNOSIS — M1990 Unspecified osteoarthritis, unspecified site: Secondary | ICD-10-CM | POA: Diagnosis present

## 2023-07-19 DIAGNOSIS — K219 Gastro-esophageal reflux disease without esophagitis: Secondary | ICD-10-CM | POA: Diagnosis present

## 2023-07-19 DIAGNOSIS — I510 Cardiac septal defect, acquired: Secondary | ICD-10-CM | POA: Diagnosis not present

## 2023-07-19 DIAGNOSIS — Z6821 Body mass index (BMI) 21.0-21.9, adult: Secondary | ICD-10-CM | POA: Diagnosis not present

## 2023-07-19 DIAGNOSIS — D649 Anemia, unspecified: Secondary | ICD-10-CM | POA: Diagnosis present

## 2023-07-19 DIAGNOSIS — J441 Chronic obstructive pulmonary disease with (acute) exacerbation: Secondary | ICD-10-CM | POA: Diagnosis present

## 2023-07-19 DIAGNOSIS — I480 Paroxysmal atrial fibrillation: Secondary | ICD-10-CM | POA: Diagnosis not present

## 2023-07-19 DIAGNOSIS — R509 Fever, unspecified: Secondary | ICD-10-CM | POA: Diagnosis not present

## 2023-07-19 DIAGNOSIS — I252 Old myocardial infarction: Secondary | ICD-10-CM | POA: Diagnosis not present

## 2023-07-19 DIAGNOSIS — G894 Chronic pain syndrome: Secondary | ICD-10-CM | POA: Diagnosis present

## 2023-07-19 DIAGNOSIS — J9621 Acute and chronic respiratory failure with hypoxia: Secondary | ICD-10-CM | POA: Diagnosis present

## 2023-07-19 DIAGNOSIS — I10 Essential (primary) hypertension: Secondary | ICD-10-CM | POA: Diagnosis not present

## 2023-07-19 DIAGNOSIS — F17210 Nicotine dependence, cigarettes, uncomplicated: Secondary | ICD-10-CM | POA: Diagnosis present

## 2023-07-20 DIAGNOSIS — I454 Nonspecific intraventricular block: Secondary | ICD-10-CM

## 2023-07-21 ENCOUNTER — Encounter: Payer: Self-pay | Admitting: Family Medicine

## 2023-07-21 DIAGNOSIS — I2510 Atherosclerotic heart disease of native coronary artery without angina pectoris: Secondary | ICD-10-CM

## 2023-07-21 DIAGNOSIS — I480 Paroxysmal atrial fibrillation: Secondary | ICD-10-CM

## 2023-07-21 DIAGNOSIS — I471 Supraventricular tachycardia, unspecified: Secondary | ICD-10-CM

## 2023-07-21 DIAGNOSIS — I361 Nonrheumatic tricuspid (valve) insufficiency: Secondary | ICD-10-CM

## 2023-07-25 ENCOUNTER — Other Ambulatory Visit: Payer: Self-pay | Admitting: Family Medicine

## 2023-07-25 DIAGNOSIS — J189 Pneumonia, unspecified organism: Secondary | ICD-10-CM

## 2023-07-26 ENCOUNTER — Telehealth: Payer: Self-pay

## 2023-07-26 NOTE — Transitions of Care (Post Inpatient/ED Visit) (Signed)
 07/26/2023  Name: Donna Little MRN: [id number] DOB: Nov 13, 1945  Today's TOC FU Call Status: Today's TOC FU Call Status:: Successful TOC FU Call Completed - Patient declined medication review and declined enrollment in 30 day program  TOC FU Call Complete Date: 07/26/23 Patient's Name and Date of Birth confirmed.  Transition Care Management Follow-up Telephone Call Date of Discharge: 07/23/23 Discharge Facility: Other (Non-[facility] Facility) Name of Other ([facility]) [facility]charge Facility: [facility] Type of Discharge: Inpatient Admission Primary Inpatient Discharge Diagnosis:: Acute Resp Failure How have you been since you were released from the hospital?: Better Any questions or concerns?: No (Patient denies)  Items Reviewed: Any new allergies since your discharge?: No Dietary orders reviewed?: Yes Type of Diet Ordered:: Cardiac low salt diet Do you have support at home?: Yes People in Home [RPT]: alone Name of Support/Comfort Primary Source: patient lives alone but son and daughter help as needed  Medications Reviewed Today: Patient declined review Medications Reviewed Today   Medications were not reviewed in this encounter     Home Care and Equipment/Supplies: Were Home Health Services Ordered?: No Any new equipment or medical supplies ordered?: Yes Name of Medical supply agency?: Patient states [facility] Were you able to get the equipment/medical supplies?: No Do you have any questions related to the use of the equipment/supplies?: No  Functional Questionnaire: Do you need assistance with bathing/showering or dressing?: No Do you need assistance with meal preparation?: No Do you need assistance with eating?: No Do you have difficulty maintaining continence: No Do you need assistance with getting out of bed/getting out of a chair/moving?: No Do you have difficulty managing or taking your medications?: Yes (Patient states son fills pill box  weekly)  Follow up appointments reviewed: PCP Follow-up appointment confirmed?: No MD Provider Line Number:[phone] Given: No (patient states she will schedule when son is able to take her) Specialist Hospital Follow-up appointment confirmed?: NA Do you need transportation to your follow-up appointment?: No Do you understand care options if your condition(s) worsen?: Yes-patient verbalized understanding  SDOH Interventions Today    Flowsheet Row Most Recent Value  SDOH Interventions   Food Insecurity Interventions Intervention Not Indicated  Housing Interventions Intervention Not Indicated  Transportation Interventions Intervention Not Indicated  Utilities Interventions Intervention Not Indicated       Goals Addressed             This Visit's Progress    COMPLETED: VBCI Transitions of Care (TOC) Care Plan       Problems: Patient declined TOC 30 day program  Recent Hospitalization for treatment of Acute Resp Failure Medication management barrier Patient states her son fills her pill box and was not available for review - Patient states she does have her new pills and takes medications without issue-unable to complete medication reconiliation   Goal: Patient declined TOC 30 day program - Unable to enroll patient in 30 day TOC program  Over the next 30 days, the patient will not experience hospital readmission  Interventions:  Transitions of Care: Doctor Visits  - discussed the importance of doctor visits - Patient declined assistance from [facility] RN for scheduled hospital follow up stating her son takes her and she will need to schedule for when he is available and states she will make the call herself. - Discussed pulmonary appointment as well.  TOC RN education and safety with oxygen  and smoking cessation - patient states she hasn't smoked since she went to the hospital.   Patient Self  Care Activities:  Attend all scheduled provider appointments Call pharmacy for medication  refills 3-7 days in advance of running out of medications Call provider office for new concerns or questions  Take medications as prescribed    Plan:  No further follow up required: Patient declined TOC 30 day program - patient has [facility] RN name/number        Tonia Frankel RN, CCM [place]  VBCI-Population Health RN Care Manager [phone]

## 2023-07-27 ENCOUNTER — Telehealth: Payer: Self-pay

## 2023-07-27 NOTE — Telephone Encounter (Signed)
 Appointment made with [facility] craft

## 2023-07-29 ENCOUNTER — Ambulatory Visit (INDEPENDENT_AMBULATORY_CARE_PROVIDER_SITE_OTHER): Admitting: Physician Assistant

## 2023-07-29 ENCOUNTER — Ambulatory Visit (INDEPENDENT_AMBULATORY_CARE_PROVIDER_SITE_OTHER)
Admission: RE | Admit: 2023-07-29 | Discharge: 2023-07-29 | Disposition: A | Discharge status: Home / Self Care | Source: Ambulatory Visit | Attending: Physician Assistant | Admitting: Physician Assistant

## 2023-07-29 ENCOUNTER — Encounter: Payer: Self-pay | Admitting: Physician Assistant

## 2023-07-29 VITALS — BP 122/68 | HR 57 | Temp 97.8°F | Ht 61.5 in | Wt 125.8 lb

## 2023-07-29 DIAGNOSIS — I2583 Coronary atherosclerosis due to lipid rich plaque: Secondary | ICD-10-CM

## 2023-07-29 DIAGNOSIS — J189 Pneumonia, unspecified organism: Secondary | ICD-10-CM | POA: Diagnosis not present

## 2023-07-29 DIAGNOSIS — J9601 Acute respiratory failure with hypoxia: Secondary | ICD-10-CM | POA: Diagnosis not present

## 2023-07-29 DIAGNOSIS — I2510 Atherosclerotic heart disease of native coronary artery without angina pectoris: Secondary | ICD-10-CM

## 2023-07-29 DIAGNOSIS — J449 Chronic obstructive pulmonary disease, unspecified: Secondary | ICD-10-CM

## 2023-07-29 DIAGNOSIS — M810 Age-related osteoporosis without current pathological fracture: Secondary | ICD-10-CM

## 2023-07-29 DIAGNOSIS — R5382 Chronic fatigue, unspecified: Secondary | ICD-10-CM

## 2023-07-29 DIAGNOSIS — Z981 Arthrodesis status: Secondary | ICD-10-CM | POA: Diagnosis not present

## 2023-07-29 MED ORDER — ROMOSOZUMAB-AQQG 105 MG/1.17ML ~~LOC~~ SOSY
210.0000 mg | PREFILLED_SYRINGE | Freq: Once | SUBCUTANEOUS | Status: AC
  Administered 2023-07-29: 210 mg via SUBCUTANEOUS

## 2023-07-29 NOTE — Progress Notes (Signed)
 Subjective:  Patient ID: Donna Little, female    DOB: [birth year]  Age: 78 y.o. MRN: [id number]  Chief Complaint  Patient presents with   Hospital Follow up    HPI:   Follow up Hospitalization  Patient was admitted to [facility] on 07/19/23 and discharged on 07/23/23. She was treated for Pneumonia, . Treatment for this included azithromycin, cefipim. Telephone follow up was done on 07/27/2023 She reports good compliance with treatment. She reports this condition is improved.   Discussed the use of AI scribe software for clinical note transcription with the patient, who gave verbal consent to proceed.  History of Present Illness   The patient, with a history of emphysema and recent hospitalization for pneumonia, presents with residual crackling in her lungs. She has completed a course of antibiotics and is concerned about the potential for recurrent pneumonia. She also reports a history of falls and head injuries, with recent episodes of amnesia and numbness in her hand when holding objects. She has a history of heart issues, including a clot, and is currently on blood thinners. She also mentions an "electrical problem" with her heart, suggesting a possible arrhythmia. There is a concern about a hard knot and discoloration on her hand where an IV was placed. The patient's daughter reports observing her mother's oxygen  levels drop when she talks, suggesting potential sleep apnea.          06/08/2023    3:23 PM 11/25/2022    2:09 PM 06/03/2022   11:43 AM 04/22/2022   10:34 AM 03/18/2022   10:17 AM  Depression screen PHQ 2/9  Decreased Interest 2 2 3 3 2   Down, Depressed, Hopeless 2 2 3 3 2   PHQ - 2 Score 4 4 6 6 4   Altered sleeping 1 2 3 3 1   Tired, decreased energy 2 2 3 3 1   Change in appetite 2 2 3 3  0  Feeling bad or failure about yourself  0 0 0 3 0  Trouble concentrating 0 0 0 3 0  Moving slowly or fidgety/restless 0 2 0 3 0  Suicidal thoughts 0 0 0 0 0  PHQ-9 Score 9  12 15 24 6   Difficult doing work/chores Somewhat difficult Somewhat difficult Very difficult Extremely dIfficult         11/25/2022    2:09 PM  Fall Risk   Falls in the past year? 1  Number falls in past yr: 1  Injury with Fall? 1  Risk for fall due to : Impaired balance/gait;History of fall(s)  Follow up Falls evaluation completed;Education provided;Falls prevention discussed    Patient Care Team: Mercy Stall, MD as PCP - General (Family Medicine) Murray Arnold, Willian Harrow, MD as Referring Physician (Cardiology) Jerlene Moody, MD (Gastroenterology) Merriam Abbey, DO as Consulting Physician (Neurology) Stefan Edge, MD as Consulting Physician (Rheumatology) Darryl Endow., MD (Surgery) Powers, Levern Reader, MD as Referring Physician (Neurosurgery) Guss Legacy, FNP as Nurse Practitioner (Urology)   Review of Systems  Constitutional:  Negative for chills, fatigue and fever.  HENT:  Negative for congestion, ear pain and sore throat.   Respiratory:  Negative for cough and shortness of breath.   Cardiovascular:  Negative for chest pain and palpitations.  Gastrointestinal:  Negative for abdominal pain, constipation, diarrhea, nausea and vomiting.  Genitourinary:  Negative for difficulty urinating and dysuria.  Musculoskeletal:  Negative for arthralgias, back pain and myalgias.  Skin:  Positive for wound. Negative for rash.  Neurological:  Positive for  numbness. Negative for dizziness, tremors, seizures, syncope, light-headedness and headaches.  Psychiatric/Behavioral:  Negative for dysphoric mood.     Current Outpatient Medications on File Prior to Visit  Medication Sig Dispense Refill   acetaminophen  (TYLENOL ) 325 MG tablet Take by mouth.     albuterol  (VENTOLIN  HFA) 108 (90 Base) MCG/ACT inhaler Inhale 2 puffs into the lungs every 6 (six) hours as needed. 18 g 1   amLODipine  (NORVASC ) 2.5 MG tablet Take 1 tablet (2.5 mg total) by mouth daily. 90 tablet 0   atorvastatin   (LIPITOR) 80 MG tablet Take 1 tablet (80 mg total) by mouth daily. 90 tablet 1   buPROPion (WELLBUTRIN XL) 150 MG 24 hr tablet Take 150 mg by mouth daily.     citalopram  (CELEXA ) 10 MG tablet Take 1 tablet (10 mg total) by mouth daily. 30 tablet 0   clopidogrel  (PLAVIX ) 75 MG tablet Take 1 tablet (75 mg total) by mouth daily. 90 tablet 1   dicyclomine  (BENTYL ) 20 MG tablet TAKE 1 TABLET BY MOUTH 4 TIMES DAILY BEFORE MEAL(S) AND AT BEDTIME 360 tablet 0   diltiazem  (CARDIZEM  CD) 120 MG 24 hr capsule Take 120 mg by mouth daily.     doxycycline (VIBRA-TABS) 100 MG tablet Take 100 mg by mouth 2 (two) times daily.     Fluticasone-Umeclidin-Vilant (TRELEGY ELLIPTA ) 100-62.5-25 MCG/ACT AEPB Inhale into the lungs.     gabapentin  (NEURONTIN ) 800 MG tablet Take 1 tablet (800 mg total) by mouth 3 (three) times daily. 270 tablet 1   levETIRAcetam  (KEPPRA ) 500 MG tablet Take 1 tablet (500 mg total) by mouth 2 (two) times daily. 60 tablet 5   meclizine  (ANTIVERT ) 25 MG tablet Take 1 tablet (25 mg total) by mouth every 8 (eight) hours as needed for dizziness. 30 tablet 2   morphine  (MS CONTIN ) 15 MG 12 hr tablet Take 1 tablet (15 mg total) by mouth every 12 (twelve) hours. 60 tablet 0   naloxone  (NARCAN ) nasal spray 4 mg/0.1 mL 1 spray (4 mg) intranasally into 1 nostril. Use a new Narcan (R) nasal spray for subsequent doses and administer into alternating nostrils. May repeat every 2 to 3 minutes 2 each 1   nitroGLYCERIN (NITROSTAT) 0.4 MG SL tablet Place under the tongue.     pantoprazole  (PROTONIX ) 40 MG tablet Take 1 tablet (40 mg total) by mouth daily. 90 tablet 3   potassium chloride  SA (KLOR-CON  M) 20 MEQ tablet Take 1 tablet (20 mEq total) by mouth 2 (two) times daily with a meal. 180 tablet 0   predniSONE  (DELTASONE ) 10 MG tablet Take 10 mg by mouth.     Romosozumab -aqqg (EVENITY ) 105 MG/1.17ML SOSY injection Inject 210 mg into the skin once.     sennosides-docusate sodium (SENOKOT-S) 8.6-50 MG tablet Take  1 tablet by mouth daily.     No current facility-administered medications on file prior to visit.   Past Medical History:  Diagnosis Date   A-fib [facility])    Atherosclerosis of both carotid arteries 2022   noted per CT on 05/14/20   Chronic pain syndrome    Chronic systolic heart failure (HCC)    Depression    Emphysema of lung (HCC) 2022   noted per CT scan on 05/14/20   Generalized atherosclerosis    GERD (gastroesophageal reflux disease)    Hypertension    IBS (irritable bowel syndrome)    Melanoma (HCC)    Other specified nontoxic goiter    Palpitations    PMR (polymyalgia rheumatica) (  HCC) 07/04/2020   Polymyalgia rheumatica (HCC)    Primary pulmonary hypertension (HCC)    Pulmonary embolism (HCC)    Stroke (HCC)    ministroke times 3 Right side weakness   Supraventricular tachycardia (HCC)    Type 2 diabetes mellitus (HCC)    diet controlled   Past Surgical History:  Procedure Laterality Date   ABDOMINAL HYSTERECTOMY     APPENDECTOMY     CATARACT EXTRACTION     CORONARY ANGIOPLASTY WITH STENT PLACEMENT  2021   KYPHOPLASTY  05/2020   L4-L5   TRIGGER FINGER RELEASE Right 03/17/2023   Procedure: right middle and ring finger RELEASE TRIGGER FINGER/A-1 PULLEY;  Surgeon: Marilyn Shropshire, MD;  Location: [facility];  Service: Orthopedics;  Laterality: Right;  mac and local    Family History  Problem Relation Age of Onset   Heart disease Mother    Diabetes Mother    Hyperlipidemia Mother    Hypertension Mother    Stroke Mother    Heart attack Mother    Heart disease Brother    Heart attack Brother    Stroke Brother    Hypertension Brother    Hyperlipidemia Brother    Social History   Socioeconomic History   Marital status: Single    Spouse name: Not on file   Number of children: 2   Years of education: Not on file   Highest education level: Not on file  Occupational History   Occupation: retired  Tobacco Use   Smoking status: Every Day     Current packs/day: 0.20    Average packs/day: 0.4 packs/day for 100.0 years (35.0 ttl pk-yrs)    Types: Cigarettes   Smokeless tobacco: Never   Tobacco comments:    Smokes 1/2 pack per day  Vaping Use   Vaping status: Never Used  Substance and Sexual Activity   Alcohol use: No    Alcohol/week: 0.0 standard drinks of alcohol   Drug use: No   Sexual activity: Not Currently  Other Topics Concern   Not on file  Social History Narrative   Not on file   Social Drivers of Health   Financial Resource Strain: Low Risk  (06/08/2023)   Overall Financial Resource Strain (CARDIA)    Difficulty of Paying Living Expenses: Not hard at all  Food Insecurity: No Food Insecurity (07/26/2023)   Hunger Vital Sign    Worried About Running Out of Food in the Last Year: Never true    Ran Out of Food in the Last Year: Never true  Transportation Needs: No Transportation Needs (07/26/2023)   PRAPARE - Administrator, Civil Service (Medical): No    Lack of Transportation (Non-Medical): No  Physical Activity: Inactive (06/08/2023)   Exercise Vital Sign    Days of Exercise per Week: 0 days    Minutes of Exercise per Session: 0 min  Stress: No Stress Concern Present (06/08/2023)   [employer] of Occupational Health - Occupational Stress Questionnaire    Feeling of Stress : Not at all  Social Connections: Socially Integrated (03/16/2022)   Received from [facility]   Social Network    How would you rate your social network (family, work, friends)?: Good participation with social networks    Objective:  BP 122/68 (BP Location: Left Arm, Patient Position: Sitting)   Pulse (!) 57   Temp 97.8 F (36.6 C) (Temporal)   Ht 5' 1.5" (1.562 m)   Wt 125 lb 12.8 oz (  57.1 kg)   LMP  (LMP Unknown)   SpO2 97% Comment: with oxygen   BMI 23.38 kg/m      07/29/2023   10:24 AM 07/09/2023   11:05 AM 06/08/2023    3:18 PM  BP/Weight  Systolic BP 122 124 108  Diastolic BP 68 72 62  Wt.  (Lbs) 125.8 125 127  BMI 23.38 kg/m2 23.24 kg/m2 23.61 kg/m2    Physical Exam Vitals reviewed.  Constitutional:      Appearance: Normal appearance.  Cardiovascular:     Rate and Rhythm: Normal rate and regular rhythm.     Heart sounds: Normal heart sounds.  Pulmonary:     Effort: Pulmonary effort is normal.     Breath sounds: Rhonchi present.  Abdominal:     General: Bowel sounds are normal.     Palpations: Abdomen is soft.     Tenderness: There is no abdominal tenderness.  Skin:    General: Skin is warm.  Neurological:     Mental Status: She is alert and oriented to person, place, and time.  Psychiatric:        Mood and Affect: Mood normal.        Behavior: Behavior normal.     Diabetic Foot Exam - Simple   No data filed      Lab Results  Component Value Date   WBC 7.0 06/08/2023   HGB 14.3 06/08/2023   HCT 43.5 06/08/2023   PLT 194 06/08/2023   GLUCOSE 82 06/24/2023   CHOL 128 06/08/2023   TRIG 111 06/08/2023   HDL 41 06/08/2023   LDLCALC 67 06/08/2023   ALT 14 06/24/2023   AST 29 06/24/2023   NA 140 06/24/2023   K 5.0 06/24/2023   CL 104 06/24/2023   CREATININE 0.81 06/24/2023   BUN 7 (L) 06/24/2023   CO2 21 06/24/2023   TSH 1.020 11/25/2022   HGBA1C 5.8 (H) 06/08/2023   MICROALBUR 30 01/20/2020      Assessment & Plan:  Acute respiratory failure with hypoxia [facility]) Assessment & Plan: Will order portable oxygen  concentrator  Continue to monitor symptoms If symptoms persist will refer to pulmonology  Orders: -     DG Chest 2 View; Future -     PR PORTABLE OXYGEN  CONCENTRATOR  Pneumonia due to infectious organism, unspecified laterality, unspecified part of lung Assessment & Plan: Follow up x-ray to confirm cleared pneumonia Continue to monitor symptoms Will get CT if x-ray inconclusive or shows residual fluid  Orders: -     DG Chest 2 View; Future  COPD mixed type [facility]) Assessment & Plan: Persistent crackles suggest possible residual  pneumonia or fluid from emphysema/COPD. - Order chest x-ray to assess for residual pneumonia. - Consider CT scan if x-ray shows persistent pneumonia. - Discuss potential need for further antibiotics based on imaging results.  Orders: -     PR PORTABLE OXYGEN  CONCENTRATOR  Osteoporosis, post-menopausal Assessment & Plan: Continue with current treatment Continue to avoid falls  Schedule follow up if falls continue  Orders: -     Romosozumab -aqqg  Chronic fatigue Assessment & Plan: Symptoms include holding breath while talking and during sleep. Discussed availability of at-home sleep studies. - Order at-home sleep study to evaluate for sleep apnea. - Consider CPAP therapy if sleep apnea is confirmed.      Orders: -     Home sleep test  Coronary artery disease due to lipid rich plaque Assessment & Plan: Cardiovascular issues including plaque buildup and previous  ablation. Coldness in hand possibly related to circulation issues. - Follow up with cardiologist regarding coldness in hand and circulation. - Continue current medication trial for plaque dissolution. - Discuss potential need for further intervention based on cardiologist's evaluation.      Meds ordered this encounter  Medications   Romosozumab -aqqg (EVENITY ) 105 MG/1. injection 210 mg    Restricted to Outpatient Use.  Do Not Tube.    Orders Placed This Encounter  Procedures   DG Chest 2 View   Home sleep test   PR PORTABLE OXYGEN  CONCENTRATOR    Allergic reaction with itching Allergic reaction with itching involving scalp, ears, and eyelids. Benadryl  was somewhat effective. Advised to seek emergency care if symptoms worsen or breathing difficulties occur. - Advise use of Benadryl  for itching. - Instruct to seek emergency care if symptoms worsen or breathing difficulties occur.      Follow-up: No follow-ups on file.   I,Lauren M Auman,acting as a Neurosurgeon for [employer], PA.,have documented all  relevant documentation on the behalf of Odilia Bennett, PA,as directed by  Odilia Bennett, PA while in the presence of Odilia Bennett, [place].   An After Visit Summary was printed and given to the patient.  Odilia Bennett, [place] [facility] [phone]

## 2023-08-02 ENCOUNTER — Other Ambulatory Visit: Payer: Self-pay | Admitting: Family Medicine

## 2023-08-02 ENCOUNTER — Encounter: Payer: Self-pay | Admitting: Family Medicine

## 2023-08-02 ENCOUNTER — Telehealth: Payer: Self-pay

## 2023-08-02 DIAGNOSIS — I110 Hypertensive heart disease with heart failure: Secondary | ICD-10-CM

## 2023-08-02 NOTE — Telephone Encounter (Signed)
 Order filled out, waiting for provider signature, will fax once complete.  Copied from CRM [id number]. Topic: General - Other >> Aug 02, 2023 11:25 AM Donna Little wrote: Reason for CRM: Needs an order for portable oxygen  whatever liter flow (2 liters) with conserving device via nasal canula.   Donna Little from [facility] Patient Best contact: [phone]  Fax: [phone]

## 2023-08-02 NOTE — Telephone Encounter (Signed)
 Done. Dr. Sedalia Muta

## 2023-08-04 ENCOUNTER — Other Ambulatory Visit: Payer: Self-pay

## 2023-08-04 DIAGNOSIS — M810 Age-related osteoporosis without current pathological fracture: Secondary | ICD-10-CM

## 2023-08-04 MED ORDER — ROMOSOZUMAB-AQQG 105 MG/1.17ML ~~LOC~~ SOSY
210.0000 mg | PREFILLED_SYRINGE | SUBCUTANEOUS | Status: AC
  Administered 2023-11-17 (×2): 210 mg via SUBCUTANEOUS

## 2023-08-09 DIAGNOSIS — J189 Pneumonia, unspecified organism: Secondary | ICD-10-CM | POA: Insufficient documentation

## 2023-08-09 DIAGNOSIS — J9601 Acute respiratory failure with hypoxia: Secondary | ICD-10-CM | POA: Insufficient documentation

## 2023-08-09 NOTE — Assessment & Plan Note (Signed)
 Follow up x-ray to confirm cleared pneumonia Continue to monitor symptoms Will get CT if x-ray inconclusive or shows residual fluid

## 2023-08-09 NOTE — Assessment & Plan Note (Signed)
 Cardiovascular issues including plaque buildup and previous ablation. Coldness in hand possibly related to circulation issues. - Follow up with cardiologist regarding coldness in hand and circulation. - Continue current medication trial for plaque dissolution. - Discuss potential need for further intervention based on cardiologist's evaluation.

## 2023-08-09 NOTE — Assessment & Plan Note (Signed)
 Continue with current treatment Continue to avoid falls  Schedule follow up if falls continue

## 2023-08-09 NOTE — Assessment & Plan Note (Signed)
 Will order portable oxygen  concentrator  Continue to monitor symptoms If symptoms persist will refer to pulmonology

## 2023-08-09 NOTE — Patient Instructions (Signed)
 VISIT SUMMARY:  During today's visit, we discussed your recent health concerns, including residual lung issues following pneumonia, potential emphysema complications, allergic reactions, cardiovascular concerns, and possible sleep apnea. We reviewed your symptoms and outlined a plan to address each issue.  YOUR PLAN:  -PNEUMONIA: Pneumonia is an infection that inflames the air sacs in one or both lungs. We will order a chest x-ray to check for any remaining signs of pneumonia. Depending on the results, we may consider a CT scan and possibly further antibiotics.  -EMPHYSEMA: Emphysema is a lung condition that causes shortness of breath. We will evaluate the chest x-ray results to determine if the crackles in your lungs are due to emphysema or residual pneumonia.  -ALLERGIC REACTION WITH ITCHING: An allergic reaction can cause itching and discomfort. You should continue using Benadryl  for the itching. If your symptoms worsen or you have trouble breathing, seek emergency care immediately.  -CARDIOVASCULAR CONCERNS: Your cardiovascular issues include plaque buildup and previous heart procedures. The coldness in your hand may be related to circulation problems. You should follow up with your cardiologist to discuss this and continue your current medication. Further interventions may be needed based on your cardiologist's evaluation.  -SLEEP APNEA: Sleep apnea is a condition where breathing repeatedly stops and starts during sleep. We will order an at-home sleep study to evaluate this. If sleep apnea is confirmed, we may consider CPAP therapy to help manage it.  INSTRUCTIONS:  Please follow up with your cardiologist regarding the coldness in your hand and circulation issues. Additionally, complete the at-home sleep study as ordered. We will review the chest x-ray results to determine the next steps for your lung health.

## 2023-08-09 NOTE — Assessment & Plan Note (Signed)
 Symptoms include holding breath while talking and during sleep. Discussed availability of at-home sleep studies. - Order at-home sleep study to evaluate for sleep apnea. - Consider CPAP therapy if sleep apnea is confirmed.

## 2023-08-09 NOTE — Assessment & Plan Note (Signed)
 Persistent crackles suggest possible residual pneumonia or fluid from emphysema/COPD. - Order chest x-ray to assess for residual pneumonia. - Consider CT scan if x-ray shows persistent pneumonia. - Discuss potential need for further antibiotics based on imaging results.

## 2023-08-10 ENCOUNTER — Ambulatory Visit: Payer: Self-pay

## 2023-08-10 NOTE — Progress Notes (Signed)
 Acute Office Visit  Subjective:    Patient ID: Donna Little, female    DOB: [birth year], 78 y.o.   MRN: [id number]  Chief Complaint  Patient presents with   Swelling    HPI: Patient is in today for swelling in left hand x 2 weeks, some redness on palm some decreased swelling. Both hands hurt at times. Improved today. It seemed to start when she began wellbutrin which was to help quit smoking. Patient has quit smoking 4-5 weeks ago and has no desire to restart smoking.   Past Medical History:  Diagnosis Date   A-fib [facility])    Atherosclerosis of both carotid arteries 2022   noted per CT on 05/14/20   Chronic pain syndrome    Chronic systolic heart failure (HCC)    Depression    Emphysema of lung (HCC) 2022   noted per CT scan on 05/14/20   Generalized atherosclerosis    GERD (gastroesophageal reflux disease)    Hypertension    IBS (irritable bowel syndrome)    Melanoma (HCC)    Other specified nontoxic goiter    Palpitations    PMR (polymyalgia rheumatica) (HCC) 07/04/2020   Polymyalgia rheumatica (HCC)    Primary pulmonary hypertension (HCC)    Pulmonary embolism (HCC)    Stroke (HCC)    ministroke times 3 Right side weakness   Supraventricular tachycardia (HCC)    Type 2 diabetes mellitus (HCC)    diet controlled    Past Surgical History:  Procedure Laterality Date   ABDOMINAL HYSTERECTOMY     APPENDECTOMY     CATARACT EXTRACTION     CORONARY ANGIOPLASTY WITH STENT PLACEMENT  2021   KYPHOPLASTY  05/2020   L4-L5   TRIGGER FINGER RELEASE Right 03/17/2023   Procedure: right middle and ring finger RELEASE TRIGGER FINGER/A-1 PULLEY;  Surgeon: Marilyn Shropshire, MD;  Location: [facility];  Service: Orthopedics;  Laterality: Right;  mac and local    Family History  Problem Relation Age of Onset   Heart disease Mother    Diabetes Mother    Hyperlipidemia Mother    Hypertension Mother    Stroke Mother    Heart attack Mother    Heart disease Brother     Heart attack Brother    Stroke Brother    Hypertension Brother    Hyperlipidemia Brother     Social History   Socioeconomic History   Marital status: Single    Spouse name: Not on file   Number of children: 2   Years of education: Not on file   Highest education level: Not on file  Occupational History   Occupation: retired  Tobacco Use   Smoking status: Every Day    Current packs/day: 0.20    Average packs/day: 0.4 packs/day for 100.0 years (35.0 ttl pk-yrs)    Types: Cigarettes   Smokeless tobacco: Never   Tobacco comments:    Smokes 1/2 pack per day  Vaping Use   Vaping status: Never Used  Substance and Sexual Activity   Alcohol use: No    Alcohol/week: 0.0 standard drinks of alcohol   Drug use: No   Sexual activity: Not Currently  Other Topics Concern   Not on file  Social History Narrative   Not on file   Social Drivers of Health   Financial Resource Strain: Low Risk  (06/08/2023)   Overall Financial Resource Strain (CARDIA)    Difficulty of Paying Living Expenses: Not hard at all  Food  Insecurity: No Food Insecurity (07/26/2023)   Hunger Vital Sign    Worried About Running Out of Food in the Last Year: Never true    Ran Out of Food in the Last Year: Never true  Transportation Needs: No Transportation Needs (07/26/2023)   PRAPARE - Administrator, Civil Service (Medical): No    Lack of Transportation (Non-Medical): No  Physical Activity: Inactive (06/08/2023)   Exercise Vital Sign    Days of Exercise per Week: 0 days    Minutes of Exercise per Session: 0 min  Stress: No Stress Concern Present (06/08/2023)   [employer] of Occupational Health - Occupational Stress Questionnaire    Feeling of Stress : Not at all  Social Connections: Socially Integrated (03/16/2022)   Received from [facility], [facility]   Social Network    How would you rate your social network (family, work, friends)?: Good participation with social networks  Intimate  Partner Violence: Not At Risk (07/26/2023)   Humiliation, Afraid, Rape, and Kick questionnaire    Fear of Current or Ex-Partner: No    Emotionally Abused: No    Physically Abused: No    Sexually Abused: No    Outpatient Medications Prior to Visit  Medication Sig Dispense Refill   acetaminophen  (TYLENOL ) 325 MG tablet Take by mouth.     albuterol  (VENTOLIN  HFA) 108 (90 Base) MCG/ACT inhaler Inhale 2 puffs into the lungs every 6 (six) hours as needed. 18 g 1   amLODipine  (NORVASC ) 2.5 MG tablet Take 1 tablet (2.5 mg total) by mouth daily. 90 tablet 0   atorvastatin  (LIPITOR) 80 MG tablet Take 1 tablet (80 mg total) by mouth daily. 90 tablet 1   buPROPion (WELLBUTRIN XL) 150 MG 24 hr tablet Take 150 mg by mouth daily.     citalopram  (CELEXA ) 10 MG tablet Take 1 tablet (10 mg total) by mouth daily. 30 tablet 0   clopidogrel  (PLAVIX ) 75 MG tablet Take 1 tablet (75 mg total) by mouth daily. 90 tablet 1   dicyclomine  (BENTYL ) 20 MG tablet TAKE 1 TABLET BY MOUTH 4 TIMES DAILY BEFORE MEAL(S) AND AT BEDTIME 360 tablet 0   diltiazem (CARDIZEM CD) 120 MG 24 hr capsule Take 120 mg by mouth daily.     doxycycline (VIBRA-TABS) 100 MG tablet Take 100 mg by mouth 2 (two) times daily.     Fluticasone-Umeclidin-Vilant (TRELEGY ELLIPTA ) 100-62.5-25 MCG/ACT AEPB Inhale into the lungs.     furosemide  (LASIX ) 40 MG tablet Take 2 tablets by mouth twice daily 360 tablet 0   gabapentin  (NEURONTIN ) 800 MG tablet Take 1 tablet (800 mg total) by mouth 3 (three) times daily. 270 tablet 1   levETIRAcetam  (KEPPRA ) 500 MG tablet Take 1 tablet (500 mg total) by mouth 2 (two) times daily. 60 tablet 5   meclizine  (ANTIVERT ) 25 MG tablet Take 1 tablet (25 mg total) by mouth every 8 (eight) hours as needed for dizziness. 30 tablet 2   morphine  (MS CONTIN ) 15 MG 12 hr tablet Take 1 tablet (15 mg total) by mouth every 12 (twelve) hours. 60 tablet 0   naloxone  (NARCAN ) nasal spray 4 mg/0.1 mL 1 spray (4 mg) intranasally into 1  nostril. Use a new Narcan (R) nasal spray for subsequent doses and administer into alternating nostrils. May repeat every 2 to 3 minutes 2 each 1   nitroGLYCERIN (NITROSTAT) 0.4 MG SL tablet Place under the tongue.     pantoprazole  (PROTONIX ) 40 MG tablet Take 1 tablet (40  mg total) by mouth daily. 90 tablet 3   potassium chloride  SA (KLOR-CON  M) 20 MEQ tablet Take 1 tablet (20 mEq total) by mouth 2 (two) times daily with a meal. 180 tablet 0   predniSONE  (DELTASONE ) 10 MG tablet Take 10 mg by mouth.     Romosozumab -aqqg (EVENITY ) 105 MG/1.17ML SOSY injection Inject 210 mg into the skin once.     sennosides-docusate sodium (SENOKOT-S) 8.6-50 MG tablet Take 1 tablet by mouth daily.     Facility-Administered Medications Prior to Visit  Medication Dose Route Frequency Provider Last Rate Last Admin   [START ON 08/29/2023] Romosozumab -aqqg (EVENITY ) 105 MG/1. injection 210 mg  210 mg Subcutaneous Q30 days Cicilia Clinger, MD        Allergies  Allergen Reactions   Codeine     "It made me crazy"   Macrobid  [Nitrofurantoin ] Nausea And Vomiting   Methocarbamol Other (See Comments)    sleepy   Sertraline Other (See Comments)    Insomnia    Tizanidine Other (See Comments)    sleepy    Review of Systems  Constitutional:  Negative for chills, fatigue and fever.  HENT:  Negative for congestion, ear pain, postnasal drip, rhinorrhea, sinus pressure, sinus pain and sore throat.   Respiratory:  Negative for cough and shortness of breath.   Cardiovascular:  Negative for chest pain.  Gastrointestinal:  Negative for diarrhea and nausea.  Neurological:  Negative for dizziness and headaches.       Objective:        08/11/2023    9:44 AM 07/29/2023   10:24 AM 07/09/2023   11:05 AM  Vitals with BMI  Height 5' 1.5" 5' 1.5" 5' 1.5"  Weight 129 lbs 125 lbs 13 oz 125 lbs  BMI 23.98 23.39 23.24  Systolic 110 122 409  Diastolic 64 68 72  Pulse 79 57 74    No data found.   Physical Exam Vitals  reviewed.  Constitutional:      Appearance: Normal appearance.  Cardiovascular:     Rate and Rhythm: Normal rate and regular rhythm.     Heart sounds: Normal heart sounds.  Pulmonary:     Effort: Pulmonary effort is normal.     Breath sounds: Normal breath sounds.  Musculoskeletal:     Comments: Both palms mild erythema. No edema. Nontender.   Neurological:     Mental Status: She is alert.     Health Maintenance Due  Topic Date Due   Hepatitis C Screening  Never done   Lung Cancer Screening  Never done   OPHTHALMOLOGY EXAM  01/14/2021    There are no preventive care reminders to display for this patient.   Lab Results  Component Value Date   TSH 1.020 11/25/2022   Lab Results  Component Value Date   WBC 7.0 06/08/2023   HGB 14.3 06/08/2023   HCT 43.5 06/08/2023   MCV 98 (H) 06/08/2023   PLT 194 06/08/2023   Lab Results  Component Value Date   NA 140 06/24/2023   K 5.0 06/24/2023   CO2 21 06/24/2023   GLUCOSE 82 06/24/2023   BUN 7 (L) 06/24/2023   CREATININE 0.81 06/24/2023   BILITOT 0.3 06/24/2023   ALKPHOS 230 (H) 06/24/2023   AST 29 06/24/2023   ALT 14 06/24/2023   PROT 6.4 06/24/2023   ALBUMIN 4.2 06/24/2023   CALCIUM  8.9 06/24/2023   EGFR 74 06/24/2023   Lab Results  Component Value Date   CHOL 128 06/08/2023  Lab Results  Component Value Date   HDL 41 06/08/2023   Lab Results  Component Value Date   LDLCALC 67 06/08/2023   Lab Results  Component Value Date   TRIG 111 06/08/2023   Lab Results  Component Value Date   CHOLHDL 3.1 06/08/2023   Lab Results  Component Value Date   HGBA1C 5.8 (H) 06/08/2023       Assessment & Plan:  Swelling of left hand Assessment & Plan: STOP WELLBUTRIN (BUPROPION.) Elevate hand if it happen again.   Tobacco use disorder Assessment & Plan: Patient quit smoking. Encourage patient to continue with cessation.      No orders of the defined types were placed in this encounter.   No orders  of the defined types were placed in this encounter.    Follow-up: No follow-ups on file.  An After Visit Summary was printed and given to the patient.   Gladys Lamp I Leal-Borjas,acting as a scribe for Mercy Stall, MD.,have documented all relevant documentation on the behalf of Mercy Stall, MD,as directed by  Mercy Stall, MD while in the presence of Mercy Stall, MD.   I attest that I have reviewed this visit and agree with the plan scribed by my staff.   Mercy Stall, MD [facility] [phone]

## 2023-08-10 NOTE — Telephone Encounter (Signed)
 Copied from CRM [id number]. Topic: Clinical - Red Word Triage >> Aug 10, 2023  8:23 AM Donna Little E wrote: [facility] that prompted transfer to Nurse Triage: Hand swelling. Patient noticed her left hand began to swell yesterday morning and is red and it has not gone down. Middle finger hurts with activity.  Chief Complaint: left hand swelling and red, right hand red Symptoms: see above Frequency: constant Pertinent Negatives: Patient denies fever, rash, injury Disposition: [] ED /[] Urgent Care (no appt availability in office) / [x] Appointment(In office/virtual)/ []  [place] [facility]/ [] Home Care/ [] Refused Recommended Disposition /[] [place] [facility]/ []  Follow-up with PCP Additional Notes: per protocol apt made.  Care advice given, denies questions; instructed to go to ER if becomes worse.   Reason for Disposition  Swollen joint of new-onset  Answer Assessment - Initial Assessment Questions 1. ONSET: "When did the pain/swelling start?"     Left hand swelling and right hand red and not swollen 2. LOCATION: "Where is the swelling located?"     Left hand 3. PAIN: "How bad is the pain?" (Scale 1-10; or mild, moderate, severe)   - MILD (1-3): doesn't interfere with normal activities   - MODERATE (4-7): interferes with normal activities (e.g., work or school) or awakens from sleep   - SEVERE (8-10): excruciating pain, unable to use hand at all     denies 4. WORK OR EXERCISE: "Has there been any recent work or exercise that involved this part (i.e., hand or wrist) of the body?"     denies 5. CAUSE: "What do you think is causing the pain?"     unknown 6. AGGRAVATING FACTORS: "What makes the pain worse?" (e.g., using computer)     Na no pain 7. OTHER SYMPTOMS: "Do you have any other symptoms?" (e.g., neck pain, swelling, rash, numbness, fever)     Swelling to left hand 8. PREGNANCY: "Is there any chance you are pregnant?" "When was your last menstrual period?"     na  Protocols used:  Hand and Wrist Pain-A-AH

## 2023-08-11 ENCOUNTER — Ambulatory Visit (INDEPENDENT_AMBULATORY_CARE_PROVIDER_SITE_OTHER): Admitting: Family Medicine

## 2023-08-11 ENCOUNTER — Telehealth: Payer: Self-pay

## 2023-08-11 VITALS — BP 110/64 | HR 79 | Temp 98.0°F | Ht 61.5 in | Wt 129.0 lb

## 2023-08-11 DIAGNOSIS — F17200 Nicotine dependence, unspecified, uncomplicated: Secondary | ICD-10-CM

## 2023-08-11 DIAGNOSIS — M7989 Other specified soft tissue disorders: Secondary | ICD-10-CM | POA: Diagnosis not present

## 2023-08-11 NOTE — Telephone Encounter (Signed)
 Please call this patient to schedule the next AWV appointment with Delford Felling, FNP Last date of AWV: 12/16/2022

## 2023-08-11 NOTE — Patient Instructions (Signed)
 STOP WELLBUTRIN (BUPROPION.)

## 2023-08-12 DIAGNOSIS — M7989 Other specified soft tissue disorders: Secondary | ICD-10-CM | POA: Insufficient documentation

## 2023-08-12 NOTE — Assessment & Plan Note (Signed)
 Patient quit smoking. Encourage patient to continue with cessation.

## 2023-08-12 NOTE — Telephone Encounter (Signed)
 Contacted Donna Little to schedule their annual wellness visit. Appointment made for 12/17/2023.

## 2023-08-12 NOTE — Assessment & Plan Note (Signed)
 STOP WELLBUTRIN (BUPROPION.) Elevate hand if it happen again.

## 2023-08-13 ENCOUNTER — Other Ambulatory Visit: Payer: Self-pay | Admitting: Family Medicine

## 2023-08-13 DIAGNOSIS — F331 Major depressive disorder, recurrent, moderate: Secondary | ICD-10-CM

## 2023-08-13 NOTE — Telephone Encounter (Signed)
 Copied from CRM [id number]. Topic: Clinical - Medication Refill >> Aug 13, 2023  1:26 PM Leory Rands wrote: Medication: levETIRAcetam  (KEPPRA ) 500 MG tablet [[id number]]  morphine  (MS CONTIN ) 15 MG 12 hr tablet [[id number]]  citalopram  (CELEXA ) 10 MG tablet [[id number]]    Has the patient contacted their pharmacy? Yes (Agent: If no, request that the patient contact the pharmacy for the refill. If patient does not wish to contact the pharmacy document the reason why and proceed with request.) (Agent: If yes, when and what did the pharmacy advise?)  This is the patient's preferred pharmacy:  [facility] [address] [place] - [address] [place] [address] Phone: [phone] Fax: [phone]  Is this the correct pharmacy for this prescription? Yes If no, delete pharmacy and type the correct one.   Has the prescription been filled recently? Yes  Is the patient out of the medication? Yes Patient reporting that she will out of medication by Monday  Has the patient been seen for an appointment in the last year OR does the patient have an upcoming appointment? Yes  Can we respond through MyChart? Yes  Agent: Please be advised that Rx refills may take up to 3 business days. We ask that you follow-up with your pharmacy.

## 2023-08-15 ENCOUNTER — Encounter: Payer: Self-pay | Admitting: Family Medicine

## 2023-08-16 MED ORDER — CITALOPRAM HYDROBROMIDE 10 MG PO TABS: 10.0000 mg | ORAL_TABLET | Freq: Every day | ORAL | 1 refills | Status: DC

## 2023-08-16 MED ORDER — LEVETIRACETAM 500 MG PO TABS: 500.0000 mg | ORAL_TABLET | Freq: Two times a day (BID) | ORAL | 1 refills | Status: DC

## 2023-08-16 MED ORDER — MORPHINE SULFATE ER 15 MG PO TBCR: 15.0000 mg | EXTENDED_RELEASE_TABLET | Freq: Two times a day (BID) | ORAL | 0 refills | Status: DC

## 2023-08-18 ENCOUNTER — Telehealth: Payer: Self-pay | Admitting: Family Medicine

## 2023-08-18 ENCOUNTER — Other Ambulatory Visit: Payer: Self-pay

## 2023-08-18 NOTE — Telephone Encounter (Signed)
 Prescription Request  08/18/2023  LOV: 08/11/2023  What is the name of the medication or equipment? diltiazem (CARDIZEM CD) 120 MG 24 hr capsule   Have you contacted your pharmacy to request a refill? No   Which pharmacy would you like this sent to?   [facility] [address] [place] - [address] [place] [address] Phone: [phone] Fax: [phone]   Patient notified that their request is being sent to the clinical staff for review and that they should receive a response within 2 business days.   Please advise at [facility] [phone]

## 2023-08-19 MED ORDER — DILTIAZEM HCL ER COATED BEADS 120 MG PO CP24: 120.0000 mg | ORAL_CAPSULE | Freq: Every day | ORAL | 1 refills | Status: DC

## 2023-08-25 DIAGNOSIS — G4733 Obstructive sleep apnea (adult) (pediatric): Secondary | ICD-10-CM | POA: Diagnosis not present

## 2023-08-25 DIAGNOSIS — R0602 Shortness of breath: Secondary | ICD-10-CM | POA: Diagnosis not present

## 2023-08-26 DIAGNOSIS — G4733 Obstructive sleep apnea (adult) (pediatric): Secondary | ICD-10-CM | POA: Diagnosis not present

## 2023-08-26 DIAGNOSIS — R0602 Shortness of breath: Secondary | ICD-10-CM | POA: Diagnosis not present

## 2023-08-31 ENCOUNTER — Ambulatory Visit

## 2023-09-01 ENCOUNTER — Other Ambulatory Visit: Payer: Self-pay

## 2023-09-01 MED ORDER — DICYCLOMINE HCL 20 MG PO TABS: 20.0000 mg | ORAL_TABLET | Freq: Three times a day (TID) | ORAL | 1 refills | Status: AC

## 2023-09-01 MED ORDER — AMLODIPINE BESYLATE 2.5 MG PO TABS: 2.5000 mg | ORAL_TABLET | Freq: Every day | ORAL | 1 refills | Status: DC

## 2023-09-03 ENCOUNTER — Ambulatory Visit: Payer: Self-pay

## 2023-09-03 DIAGNOSIS — J441 Chronic obstructive pulmonary disease with (acute) exacerbation: Secondary | ICD-10-CM | POA: Diagnosis not present

## 2023-09-03 DIAGNOSIS — R5383 Other fatigue: Secondary | ICD-10-CM | POA: Diagnosis not present

## 2023-09-03 DIAGNOSIS — R350 Frequency of micturition: Secondary | ICD-10-CM | POA: Diagnosis not present

## 2023-09-03 NOTE — Telephone Encounter (Signed)
  Chief Complaint: Cough Symptoms: wheezing, cough with yellow sputum, lack of appetite, shortness of breath Frequency: cough increased today but according to patient never stopped since originally diagnosed with PNA Pertinent Negatives: Patient denies fever, CP Disposition: [] ED /[] Urgent Care (no appt availability in office) / [] Appointment(In office/virtual)/ []  [place] [facility]/ [] Home Care/ [x] Refused Recommended Disposition /[] [place] [facility]/ []  Follow-up with PCP Additional Notes: patient calling with concerns that her PNA has returned. Patient states she was treated in late April for PNA. Patient endorsing cough, wheezing, lack of appetite and shortness of breath. Patient denies fever and CP. Per protocol, patient is recommended to the ED due to symptoms. Patient refused-asking for antibiotics and nebulizer solution to be sent to [facility] pharmacy in [place]. Patient is needing a follow up phone call today. Verbalized understanding and all questions answered.   Copied from CRM [id number]. Topic: Clinical - Red Word Triage >> Sep 03, 2023  2:12 PM Alpha Arts wrote: Red Word that prompted transfer to Nurse Triage: Patient believes her Pneumonia has returned. Lack of appetite, uncontrollable cough, Yellow Phlegm coming up. Body aches. Reason for Disposition  Patient sounds very sick or weak to the triager  Answer Assessment - Initial Assessment Questions 1. ONSET: "When did the cough begin?"      Cough increased this morning 2. SEVERITY: "How bad is the cough today?"      Bad this morning 3. SPUTUM: "Describe the color of your sputum" (none, dry cough; clear, white, yellow, green)     Yellow  4. HEMOPTYSIS: "Are you coughing up any blood?" If so ask: "How much?" (flecks, streaks, tablespoons, etc.)     no 5. DIFFICULTY BREATHING: "Are you having difficulty breathing?" If Yes, ask: "How bad is it?" (e.g., mild, moderate, severe)    - MILD: No SOB at rest, mild SOB  with walking, speaks normally in sentences, can lie down, no retractions, pulse < 100.    - MODERATE: SOB at rest, SOB with minimal exertion and prefers to sit, cannot lie down flat, speaks in phrases, mild retractions, audible wheezing, pulse 100-120.    - SEVERE: Very SOB at rest, speaks in single words, struggling to breathe, sitting hunched forward, retractions, pulse > 120      Mild 6. FEVER: "Do you have a fever?" If Yes, ask: "What is your temperature, how was it measured, and when did it start?"     no 7. CARDIAC HISTORY: "Do you have any history of heart disease?" (e.g., heart attack, congestive heart failure)      HTN,  8. LUNG HISTORY: "Do you have any history of lung disease?"  (e.g., pulmonary embolus, asthma, emphysema)     no 9. PE RISK FACTORS: "Do you have a history of blood clots?" (or: recent major surgery, recent prolonged travel, bedridden)     no 10. OTHER SYMPTOMS: "Do you have any other symptoms?" (e.g., runny nose, wheezing, chest pain)       Lack of appetite, Wheezing 12. TRAVEL: "Have you traveled out of the country in the last month?" (e.g., travel history, exposures)       no  Protocols used: Cough - Acute Productive-A-AH

## 2023-09-03 NOTE — Telephone Encounter (Signed)
 Called patient left detailed message recommend patient go to nearest urgent care due to office being close.

## 2023-09-15 ENCOUNTER — Telehealth: Payer: Self-pay | Admitting: Family Medicine

## 2023-09-15 ENCOUNTER — Other Ambulatory Visit: Payer: Self-pay

## 2023-09-15 DIAGNOSIS — F331 Major depressive disorder, recurrent, moderate: Secondary | ICD-10-CM

## 2023-09-15 MED ORDER — LEVETIRACETAM 500 MG PO TABS: 500.0000 mg | ORAL_TABLET | Freq: Two times a day (BID) | ORAL | 3 refills | Status: DC

## 2023-09-15 MED ORDER — CITALOPRAM HYDROBROMIDE 10 MG PO TABS
10.0000 mg | ORAL_TABLET | Freq: Every day | ORAL | 3 refills | Status: DC
Start: 2023-09-15 — End: 2023-10-13

## 2023-09-15 MED ORDER — MORPHINE SULFATE ER 15 MG PO TBCR: 15.0000 mg | EXTENDED_RELEASE_TABLET | Freq: Two times a day (BID) | ORAL | 0 refills | Status: DC

## 2023-09-15 MED ORDER — GABAPENTIN 800 MG PO TABS: 800.0000 mg | ORAL_TABLET | Freq: Three times a day (TID) | ORAL | 1 refills | Status: DC

## 2023-09-15 NOTE — Telephone Encounter (Signed)
 [facility] HME Order

## 2023-09-22 ENCOUNTER — Telehealth: Payer: Self-pay

## 2023-09-22 NOTE — Telephone Encounter (Signed)
 LM for patient to call back to reschedule her Evenity  injection (cancelled 5/27 appt) - she is overdue so this can be scheduled any time - she needs to be reminded to call 30 minutes prior)

## 2023-09-30 NOTE — Telephone Encounter (Signed)
 Attempted to reach patient again to schedule Evenity .

## 2023-10-05 ENCOUNTER — Encounter: Payer: Self-pay | Admitting: Family Medicine

## 2023-10-05 ENCOUNTER — Ambulatory Visit (INDEPENDENT_AMBULATORY_CARE_PROVIDER_SITE_OTHER): Admitting: Family Medicine

## 2023-10-05 VITALS — BP 110/62 | HR 70 | Temp 97.2°F | Resp 16 | Ht 61.5 in | Wt 132.0 lb

## 2023-10-05 DIAGNOSIS — M069 Rheumatoid arthritis, unspecified: Secondary | ICD-10-CM | POA: Insufficient documentation

## 2023-10-05 DIAGNOSIS — M7541 Impingement syndrome of right shoulder: Secondary | ICD-10-CM | POA: Diagnosis not present

## 2023-10-05 NOTE — Progress Notes (Signed)
 Acute Office Visit  Subjective:    Patient ID: Donna Little, female    DOB: 22-May-1945, 78 y.o.   MRN: [id number]  Chief Complaint  Patient presents with   Arm Pain    Right    HPI: Patient is in today for right aching pain on right shoulder since 3 weeks ago with radiation to right arm and back pain.  Discussed the use of AI scribe software for clinical note transcription with the patient, who gave verbal consent to proceed.  History of Present Illness   Donna Little is an 78 year old female with rheumatoid arthritis who presents with right shoulder pain.  Right shoulder pain - Aching pain in the right shoulder for the past three weeks - Pain radiates to the arm and back - Onset was sudden, beginning one morning, described as feeling 'busted up' - Unable to move the arm due to significant discomfort - Tylenol  and morphine  have not provided relief  Polyarticular joint pain and rheumatoid arthritis - History of rheumatoid arthritis - Previously received infusions, which were discontinued due to perceived lack of efficacy - Since stopping infusions, increased pain in hands and other joints - Unable to take NSAIDs due to gastrointestinal intolerance  Recent hospitalization and cardiorespiratory symptoms - Hospitalized in April for pneumonia and COPD exacerbation - Experienced rapid heart rhythm during hospitalization, treated with adenosine and diltiazem  - Discharged on prednisone , diltiazem , antibiotic, and Wellbutrin XL; metoprolol  was discontinued  Tobacco use - Quit smoking approximately two months ago after starting at age 44       Past Medical History:  Diagnosis Date   A-fib [facility])    Atherosclerosis of both carotid arteries 2022   noted per CT on 05/14/20   Chronic pain syndrome    Chronic systolic heart failure (HCC)    Depression    Emphysema of lung (HCC) 2022   noted per CT scan on 05/14/20   Generalized atherosclerosis    GERD (gastroesophageal reflux  disease)    Hypertension    IBS (irritable bowel syndrome)    Melanoma (HCC)    Other specified nontoxic goiter    Palpitations    PMR (polymyalgia rheumatica) (HCC) 07/04/2020   Polymyalgia rheumatica (HCC)    Primary pulmonary hypertension (HCC)    Pulmonary embolism (HCC)    Stroke (HCC)    ministroke times 3 Right side weakness   Supraventricular tachycardia (HCC)    Type 2 diabetes mellitus (HCC)    diet controlled    Past Surgical History:  Procedure Laterality Date   ABDOMINAL HYSTERECTOMY     APPENDECTOMY     CATARACT EXTRACTION     CORONARY ANGIOPLASTY WITH STENT PLACEMENT  2021   KYPHOPLASTY  05/2020   L4-L5   TRIGGER FINGER RELEASE Right 03/17/2023   Procedure: right middle and ring finger RELEASE TRIGGER FINGER/A-1 PULLEY;  Surgeon: Romona Harari, MD;  Location:  SUR[facility]ERY C[facility];  Service: Orthopedics;  Laterality: Right;  mac and local    Family History  Problem Relation Age of Onset   Heart disease Mother    Diabetes Mother    Hyperlipidemia Mother    Hypertension Mother    Stroke Mother    Heart attack Mother    Heart disease Brother    Heart attack Brother    Stroke Brother    Hypertension Brother    Hyperlipidemia Brother     Social History   Socioeconomic History   Marital status: Single  Spouse name: Not on file   Number of children: 2   Years of education: Not on file   Highest education level: Not on file  Occupational History   Occupation: retired  Tobacco Use   Smoking status: Former    Average packs/day: 0.4 packs/day for 160.3 years (65.2 ttl pk-yrs)    Types: Cigarettes    Start date: 1965   Smokeless tobacco: Never   Tobacco comments:    Smokes 1/2 pack per day  Vaping Use   Vaping status: Never Used  Substance and Sexual Activity   Alcohol use: No    Alcohol/week: 0.0 standard drinks of alcohol   Drug use: No   Sexual activity: Not Currently  Other Topics Concern   Not on file  Social History Narrative    Not on file   Social Drivers of Health   Financial Resource Strain: Low Risk  (06/08/2023)   Overall Financial Resource Strain (CARDIA)    Difficulty of Paying Living Expenses: Not hard at all  Food Insecurity: No Food Insecurity (07/26/2023)   Hunger Vital Sign    Worried About Running Out of Food in the Last Year: Never true    Ran Out of Food in the Last Year: Never true  Transportation Needs: No Transportation Needs (07/26/2023)   PRAPARE - Administrator, Civil Service (Medical): No    Lack of Transportation (Non-Medical): No  Physical Activity: Inactive (06/08/2023)   Exercise Vital Sign    Days of Exercise per Week: 0 days    Minutes of Exercise per Session: 0 min  Stress: No Stress Concern Present (06/08/2023)   [employer] of Occupational Health - Occupational Stress Questionnaire    Feeling of Stress : Not at all  Social Connections: Socially Integrated (03/16/2022)   Received from [facility]   Social Network    How would you rate your social network (family, work, friends)?: Good participation with social networks  Intimate Partner Violence: Not At Risk (07/26/2023)   Humiliation, Afraid, Rape, and Kick questionnaire    Fear of Current or Ex-Partner: No    Emotionally Abused: No    Physically Abused: No    Sexually Abused: No    Outpatient Medications Prior to Visit  Medication Sig Dispense Refill   acetaminophen  (TYLENOL ) 325 MG tablet Take by mouth.     albuterol  (VENTOLIN  HFA) 108 (90 Base) MCG/ACT inhaler Inhale 2 puffs into the lungs every 6 (six) hours as needed. 18 g 1   amLODipine  (NORVASC ) 2.5 MG tablet Take 1 tablet (2.5 mg total) by mouth daily. 90 tablet 1   atorvastatin  (LIPITOR) 80 MG tablet Take 1 tablet (80 mg total) by mouth daily. 90 tablet 1   buPROPion (WELLBUTRIN XL) 150 MG 24 hr tablet Take 150 mg by mouth daily.     citalopram  (CELEXA ) 10 MG tablet Take 1 tablet (10 mg total) by mouth daily. 30 tablet 3   clopidogrel  (PLAVIX )  75 MG tablet Take 1 tablet (75 mg total) by mouth daily. 90 tablet 1   dicyclomine  (BENTYL ) 20 MG tablet Take 1 tablet (20 mg total) by mouth 4 (four) times daily -  before meals and at bedtime. 360 tablet 1   diltiazem  (CARDIZEM  CD) 120 MG 24 hr capsule Take 1 capsule (120 mg total) by mouth daily. 90 capsule 1   doxycycline (VIBRA-TABS) 100 MG tablet Take 100 mg by mouth 2 (two) times daily.     Fluticasone-Umeclidin-Vilant (TRELEGY ELLIPTA ) 100-62.5-25 MCG/ACT AEPB  Inhale into the lungs.     furosemide  (LASIX ) 40 MG tablet Take 2 tablets by mouth twice daily 360 tablet 0   gabapentin  (NEURONTIN ) 800 MG tablet Take 1 tablet (800 mg total) by mouth 3 (three) times daily. 270 tablet 1   levETIRAcetam  (KEPPRA ) 500 MG tablet Take 1 tablet (500 mg total) by mouth 2 (two) times daily. 60 tablet 3   meclizine  (ANTIVERT ) 25 MG tablet Take 1 tablet (25 mg total) by mouth every 8 (eight) hours as needed for dizziness. 30 tablet 2   morphine  (MS CONTIN ) 15 MG 12 hr tablet Take 1 tablet (15 mg total) by mouth every 12 (twelve) hours. 60 tablet 0   naloxone  (NARCAN ) nasal spray 4 mg/0.1 mL 1 spray (4 mg) intranasally into 1 nostril. Use a new Narcan (R) nasal spray for subsequent doses and administer into alternating nostrils. May repeat every 2 to 3 minutes 2 each 1   nitroGLYCERIN (NITROSTAT) 0.4 MG SL tablet Place under the tongue.     pantoprazole  (PROTONIX ) 40 MG tablet Take 1 tablet (40 mg total) by mouth daily. 90 tablet 3   potassium chloride  SA (KLOR-CON  M) 20 MEQ tablet Take 1 tablet (20 mEq total) by mouth 2 (two) times daily with a meal. 180 tablet 0   predniSONE  (DELTASONE ) 10 MG tablet Take 10 mg by mouth.     Romosozumab -aqqg (EVENITY ) 105 MG/1.17ML SOSY injection Inject 210 mg into the skin once.     sennosides-docusate sodium (SENOKOT-S) 8.6-50 MG tablet Take 1 tablet by mouth daily.     Facility-Administered Medications Prior to Visit  Medication Dose Route Frequency Provider Last Rate Last  Admin   Romosozumab -aqqg (EVENITY ) 105 MG/1. injection 210 mg  210 mg Subcutaneous Q30 days Loistine Eberlin, MD        Allergies  Allergen Reactions   Codeine Other (See Comments)    It made me crazy   Macrobid  [Nitrofurantoin ] Nausea And Vomiting   Methocarbamol Other (See Comments)    sleepy   Sertraline Other (See Comments)    Insomnia    Tizanidine Other (See Comments)    sleepy    Review of Systems  Constitutional:  Negative for chills, fatigue and fever.  HENT:  Negative for congestion, ear pain and sore throat.   Respiratory:  Negative for cough and shortness of breath.   Cardiovascular:  Negative for chest pain and palpitations.  Gastrointestinal:  Negative for abdominal pain, constipation, diarrhea, nausea and vomiting.  Endocrine: Negative for polydipsia, polyphagia and polyuria.  Genitourinary:  Negative for difficulty urinating and dysuria.  Musculoskeletal:  Positive for arthralgias (Right shoulder). Negative for back pain and myalgias.  Skin:  Negative for rash.  Neurological:  Negative for headaches.  Psychiatric/Behavioral:  Negative for dysphoric mood. The patient is not nervous/anxious.        Objective:        10/05/2023    1:08 PM 08/11/2023    9:44 AM 07/29/2023   10:24 AM  Vitals with BMI  Height 5' 1.5 5' 1.5 5' 1.5  Weight 132 lbs 129 lbs 125 lbs 13 oz  BMI 24.54 23.98 23.39  Systolic 110 110 877  Diastolic 62 64 68  Pulse 70 79 57    No data found.   Physical Exam Vitals reviewed.  Constitutional:      Appearance: Normal appearance.   Musculoskeletal:     Comments: RIGHT SHOULDER EXAM TENDER: over anteriorly, posteriorly, and superiorly ROM ABNORMAL ABDUCTION: LIMITED TO 30 DEGREES.  EXTERNAL ROTATION: LIMITED TO 30 DEGREES INTERNAL ROTATION:LIMITED TO 20 DEGREES EMPTY CAN SIGN: UNABLE TO PERFORM  HANDS TENDER.    Neurological:     Mental Status: She is alert and oriented to person, place, and time.       Joint  Injection/Arthrocentesis  Date/Time: 10/05/2023 1:34 PM  Performed by: Sherre Clapper, MD Authorized by: Sherre Clapper, MD  Indications: pain  Body area: shoulder Joint: right shoulder Local anesthesia used: yes  Anesthesia: Local anesthesia used: yes Local Anesthetic: topical anesthetic  Sedation: Patient sedated: no  Needle size: 22 G Ultrasound guidance: no Approach: posterior Triamcinolone  amount: 40 mg Lidocaine  1% amount: 5 mL Patient tolerance: patient tolerated the procedure well with no immediate complications     Health Maintenance Due  Topic Date Due   Lung Cancer Screening  Never done   OPHTHALMOLOGY EXAM  01/14/2021    There are no preventive care reminders to display for this patient.   Lab Results  Component Value Date   TSH 1.020 11/25/2022   Lab Results  Component Value Date   WBC 7.0 06/08/2023   HGB 14.3 06/08/2023   HCT 43.5 06/08/2023   MCV 98 (H) 06/08/2023   PLT 194 06/08/2023   Lab Results  Component Value Date   NA 140 06/24/2023   K 5.0 06/24/2023   CO2 21 06/24/2023   GLUCOSE 82 06/24/2023   BUN 7 (L) 06/24/2023   CREATININE 0.81 06/24/2023   BILITOT 0.3 06/24/2023   ALKPHOS 230 (H) 06/24/2023   AST 29 06/24/2023   ALT 14 06/24/2023   PROT 6.4 06/24/2023   ALBUMIN 4.2 06/24/2023   CALCIUM  8.9 06/24/2023   EGFR 74 06/24/2023   Lab Results  Component Value Date   CHOL 128 06/08/2023   Lab Results  Component Value Date   HDL 41 06/08/2023   Lab Results  Component Value Date   LDLCALC 67 06/08/2023   Lab Results  Component Value Date   TRIG 111 06/08/2023   Lab Results  Component Value Date   CHOLHDL 3.1 06/08/2023   Lab Results  Component Value Date   HGBA1C 5.8 (H) 06/08/2023       Assessment & Plan:  Impingement syndrome of right shoulder region Assessment & Plan: Ordered right shoulder x-ray. Ordered: Physical therapy.  Orders: -     DG Shoulder Right; Future -     Arthrocentesis -     Ambulatory  referral to Home Health  Rheumatoid arthritis involving both hands, unspecified whether rheumatoid factor present [facility]) Assessment & Plan: Recommend call rheumatologist to get back in as her overall joint pain has worsened since coming off of Simponi .      No orders of the defined types were placed in this encounter.   Orders Placed This Encounter  Procedures   Joint Injection/Arthrocentesis   DG Shoulder Right   Ambulatory referral to Home Health     Follow-up: No follow-ups on file.  An After Visit Summary was printed and given to the patient.  Clapper Sherre, MD [facility] [phone]

## 2023-10-05 NOTE — Assessment & Plan Note (Signed)
 Recommend call rheumatologist to get back in as her overall joint pain has worsened since coming off of Simponi .

## 2023-10-05 NOTE — Patient Instructions (Signed)
 Ordered right shoulder x-ray. Ordered: Physical therapy. Recommend call rheumatologist to get back in as her overall joint pain has worsened since coming off of Simponi .

## 2023-10-05 NOTE — Assessment & Plan Note (Signed)
 Ordered right shoulder x-ray. Ordered: Physical therapy.

## 2023-10-06 ENCOUNTER — Ambulatory Visit (HOSPITAL_BASED_OUTPATIENT_CLINIC_OR_DEPARTMENT_OTHER)
Admission: RE | Admit: 2023-10-06 | Discharge: 2023-10-06 | Disposition: A | Discharge status: Home / Self Care | Source: Ambulatory Visit | Attending: Family Medicine | Admitting: Family Medicine

## 2023-10-06 DIAGNOSIS — M25511 Pain in right shoulder: Secondary | ICD-10-CM | POA: Diagnosis not present

## 2023-10-06 DIAGNOSIS — M7541 Impingement syndrome of right shoulder: Secondary | ICD-10-CM

## 2023-10-07 ENCOUNTER — Ambulatory Visit: Payer: Self-pay | Admitting: Family Medicine

## 2023-10-11 DIAGNOSIS — Z87891 Personal history of nicotine dependence: Secondary | ICD-10-CM | POA: Diagnosis not present

## 2023-10-11 DIAGNOSIS — R413 Other amnesia: Secondary | ICD-10-CM | POA: Diagnosis not present

## 2023-10-11 DIAGNOSIS — Z1330 Encounter for screening examination for mental health and behavioral disorders, unspecified: Secondary | ICD-10-CM | POA: Diagnosis not present

## 2023-10-11 DIAGNOSIS — I10 Essential (primary) hypertension: Secondary | ICD-10-CM | POA: Diagnosis not present

## 2023-10-11 DIAGNOSIS — R0600 Dyspnea, unspecified: Secondary | ICD-10-CM | POA: Diagnosis not present

## 2023-10-11 DIAGNOSIS — I471 Supraventricular tachycardia, unspecified: Secondary | ICD-10-CM | POA: Diagnosis not present

## 2023-10-11 DIAGNOSIS — R079 Chest pain, unspecified: Secondary | ICD-10-CM | POA: Diagnosis not present

## 2023-10-13 ENCOUNTER — Other Ambulatory Visit: Payer: Self-pay

## 2023-10-13 DIAGNOSIS — F331 Major depressive disorder, recurrent, moderate: Secondary | ICD-10-CM

## 2023-10-13 NOTE — Telephone Encounter (Signed)
 Can you check in this?  Copied from CRM [id number]. Topic: Clinical - Medication Question >> Oct 13, 2023  2:42 PM Essie A wrote: Reason for CRM: Patient has been getting injections in her right arm.  She wants to know if the medicine is available so she get another injection.  Please return her call at [phone].

## 2023-10-14 MED ORDER — CITALOPRAM HYDROBROMIDE 10 MG PO TABS: 10.0000 mg | ORAL_TABLET | Freq: Every day | ORAL | 3 refills | Status: DC

## 2023-10-14 MED ORDER — LEVETIRACETAM 500 MG PO TABS: 500.0000 mg | ORAL_TABLET | Freq: Two times a day (BID) | ORAL | 3 refills | Status: DC

## 2023-10-14 MED ORDER — MORPHINE SULFATE ER 15 MG PO TBCR: 15.0000 mg | EXTENDED_RELEASE_TABLET | Freq: Two times a day (BID) | ORAL | 0 refills | Status: DC

## 2023-10-20 ENCOUNTER — Telehealth: Payer: Self-pay

## 2023-10-20 ENCOUNTER — Other Ambulatory Visit: Payer: Self-pay | Admitting: Family Medicine

## 2023-10-20 DIAGNOSIS — H16229 Keratoconjunctivitis sicca, not specified as Sjogren's, unspecified eye: Secondary | ICD-10-CM | POA: Diagnosis not present

## 2023-10-20 DIAGNOSIS — K219 Gastro-esophageal reflux disease without esophagitis: Secondary | ICD-10-CM

## 2023-10-20 DIAGNOSIS — M1991 Primary osteoarthritis, unspecified site: Secondary | ICD-10-CM | POA: Diagnosis not present

## 2023-10-20 DIAGNOSIS — M0600 Rheumatoid arthritis without rheumatoid factor, unspecified site: Secondary | ICD-10-CM | POA: Diagnosis not present

## 2023-10-20 DIAGNOSIS — M797 Fibromyalgia: Secondary | ICD-10-CM | POA: Diagnosis not present

## 2023-10-20 DIAGNOSIS — Z79899 Other long term (current) drug therapy: Secondary | ICD-10-CM | POA: Diagnosis not present

## 2023-10-20 DIAGNOSIS — M79641 Pain in right hand: Secondary | ICD-10-CM | POA: Diagnosis not present

## 2023-10-20 DIAGNOSIS — I679 Cerebrovascular disease, unspecified: Secondary | ICD-10-CM

## 2023-10-20 DIAGNOSIS — Z6823 Body mass index (BMI) 23.0-23.9, adult: Secondary | ICD-10-CM | POA: Diagnosis not present

## 2023-10-20 MED ORDER — POTASSIUM CHLORIDE CRYS ER 20 MEQ PO TBCR: 20.0000 meq | EXTENDED_RELEASE_TABLET | Freq: Two times a day (BID) | ORAL | 0 refills | Status: DC

## 2023-10-20 MED ORDER — PANTOPRAZOLE SODIUM 40 MG PO TBEC: 40.0000 mg | DELAYED_RELEASE_TABLET | Freq: Every day | ORAL | 3 refills | Status: DC

## 2023-10-20 MED ORDER — RANOLAZINE ER 500 MG PO TB12: 500.0000 mg | ORAL_TABLET | Freq: Two times a day (BID) | ORAL | 1 refills | Status: DC

## 2023-10-20 NOTE — Telephone Encounter (Signed)
 Prescription Request  10/20/2023   What is the name of the medication or equipment?   pantoprazole  (PROTONIX ) 40 MG tablet  potassium chloride  SA (KLOR-CON  M) 20 MEQ tablet   and Ranolazine  ER 500 MG  Have you contacted your pharmacy to request a refill? No   Which pharmacy would you like this sent to?  [facility] [phone] - [place], [place] - [address] AT [facility] [address]  [facility] [address] [place] - [address] [place] [address] Phone: [phone] Fax: [phone]    Patient notified that their request is being sent to the clinical staff for review and that they should receive a response within 2 business days.   Please advise at [facility] [phone]

## 2023-10-20 NOTE — Telephone Encounter (Signed)
 Sent rx protonix  and potassium chloride  to the pharmacy, I do not see Ranolazine  ER 500 MG on patient's medication list.

## 2023-10-20 NOTE — Addendum Note (Signed)
 Addended by: TWILLA DODRILL C on: 10/20/2023 11:33 AM   Modules accepted: Orders

## 2023-10-20 NOTE — Telephone Encounter (Signed)
 Sent. Dr. Sedalia Muta

## 2023-10-27 DIAGNOSIS — M0600 Rheumatoid arthritis without rheumatoid factor, unspecified site: Secondary | ICD-10-CM | POA: Diagnosis not present

## 2023-11-03 ENCOUNTER — Other Ambulatory Visit: Payer: Self-pay

## 2023-11-03 ENCOUNTER — Telehealth: Payer: Self-pay | Admitting: Family Medicine

## 2023-11-03 DIAGNOSIS — I110 Hypertensive heart disease with heart failure: Secondary | ICD-10-CM

## 2023-11-03 MED ORDER — FUROSEMIDE 40 MG PO TABS
80.0000 mg | ORAL_TABLET | Freq: Two times a day (BID) | ORAL | 0 refills | Status: DC
Start: 2023-11-03 — End: 2023-11-07

## 2023-11-03 NOTE — Telephone Encounter (Signed)
 Prescription Request  11/03/2023  LOV: 10/05/2023  What is the name of the medication or equipment? furosemide  (LASIX ) 40 MG tablet   Have you contacted your pharmacy to request a refill? No   Which pharmacy would you like this sent to?    [facility] [address] [place] - [address] [place] [address] Phone: [phone] Fax: [phone]    Patient notified that their request is being sent to the clinical staff for review and that they should receive a response within 2 business days.   Please advise at [facility] [phone]

## 2023-11-05 ENCOUNTER — Other Ambulatory Visit: Payer: Self-pay | Admitting: Family Medicine

## 2023-11-05 DIAGNOSIS — I110 Hypertensive heart disease with heart failure: Secondary | ICD-10-CM

## 2023-11-10 ENCOUNTER — Other Ambulatory Visit: Payer: Self-pay | Admitting: Family Medicine

## 2023-11-17 ENCOUNTER — Ambulatory Visit (INDEPENDENT_AMBULATORY_CARE_PROVIDER_SITE_OTHER): Admitting: Family Medicine

## 2023-11-17 ENCOUNTER — Encounter: Payer: Self-pay | Admitting: Family Medicine

## 2023-11-17 VITALS — BP 118/62 | HR 68 | Temp 97.8°F

## 2023-11-17 DIAGNOSIS — E782 Mixed hyperlipidemia: Secondary | ICD-10-CM | POA: Diagnosis not present

## 2023-11-17 DIAGNOSIS — J449 Chronic obstructive pulmonary disease, unspecified: Secondary | ICD-10-CM | POA: Diagnosis not present

## 2023-11-17 DIAGNOSIS — N1831 Chronic kidney disease, stage 3a: Secondary | ICD-10-CM

## 2023-11-17 DIAGNOSIS — M546 Pain in thoracic spine: Secondary | ICD-10-CM

## 2023-11-17 DIAGNOSIS — I2510 Atherosclerotic heart disease of native coronary artery without angina pectoris: Secondary | ICD-10-CM

## 2023-11-17 DIAGNOSIS — F331 Major depressive disorder, recurrent, moderate: Secondary | ICD-10-CM

## 2023-11-17 DIAGNOSIS — I471 Supraventricular tachycardia, unspecified: Secondary | ICD-10-CM | POA: Diagnosis not present

## 2023-11-17 DIAGNOSIS — M810 Age-related osteoporosis without current pathological fracture: Secondary | ICD-10-CM | POA: Diagnosis not present

## 2023-11-17 DIAGNOSIS — R7303 Prediabetes: Secondary | ICD-10-CM | POA: Diagnosis not present

## 2023-11-17 DIAGNOSIS — I110 Hypertensive heart disease with heart failure: Secondary | ICD-10-CM

## 2023-11-17 DIAGNOSIS — G8929 Other chronic pain: Secondary | ICD-10-CM

## 2023-11-17 DIAGNOSIS — F1120 Opioid dependence, uncomplicated: Secondary | ICD-10-CM

## 2023-11-17 DIAGNOSIS — M069 Rheumatoid arthritis, unspecified: Secondary | ICD-10-CM

## 2023-11-17 MED ORDER — AMLODIPINE BESYLATE 2.5 MG PO TABS: 2.5000 mg | ORAL_TABLET | Freq: Every day | ORAL | 1 refills | Status: DC

## 2023-11-17 MED ORDER — NALOXONE HCL 4 MG/0.1ML NA LIQD
NASAL | 1 refills | Status: DC
Start: 2023-11-17 — End: 2023-12-16

## 2023-11-17 MED ORDER — MORPHINE SULFATE ER 15 MG PO TBCR: 15.0000 mg | EXTENDED_RELEASE_TABLET | Freq: Two times a day (BID) | ORAL | 0 refills | Status: DC

## 2023-11-17 NOTE — Progress Notes (Signed)
 Subjective:  Patient ID: Donna Little, female    DOB: [birth year]  Age: 78 y.o. MRN: [id number]  Chief Complaint  Patient presents with   Back Pain   Discussed the use of AI scribe software for clinical note transcription with the patient, who gave verbal consent to proceed.  History of Present Illness   Donna Little is an 78 year old female who presents for a follow-up visit.  Shoulder pain - Shoulder pain has improved following an arthrocentesis received during her last visit with me.  Generalized pruritus - Generalized itching occurs after infusions, lasting approximately one week. - No antihistamines taken for symptom relief.  Osteoporosis management - Receives Evenity  injections for osteoporosis.  Back pain - Back pain varies in intensity. - Pain impacts daily activities on bad days. - MS Contin  15 mg twice daily   Exertional chest pain - Occasional chest pain occurs with exertion. - Chest pain subsides after rest. - requesting new prescription for ntg although she does not take it.  - resolves with rest after about 10 minutes.  - known CORONARY ARTERY DISEASE.  - PLAVIX , LIPITOR,  RANEXA .  - fully reviewed cardiology notes and they recommend LHC, but patient was reluctant. Preferred medical management.   Respiratory symptoms and medication adherence - Prescribed Trelegy inhaler and albuterol  but does not use them as directed. - Attempts to use CPAP machine nightly. Often slides off.  - Uses oxygen  2 L and bleeds in to cpap.  Tobacco use - Smokes half a pack of cigarettes every two days. - Has attempted to quit in the past.         11/17/2023    8:46 AM 06/08/2023    3:23 PM 11/25/2022    2:09 PM 06/03/2022   11:43 AM 04/22/2022   10:34 AM  Depression screen PHQ 2/9  Decreased Interest 2 2 2 3 3   Down, Depressed, Hopeless 3 2 2 3 3   PHQ - 2 Score 5 4 4 6 6   Altered sleeping 1 1 2 3 3   Tired, decreased energy 3 2 2 3 3   Change in appetite 3 2 2 3 3    Feeling bad or failure about yourself  0 0 0 0 3  Trouble concentrating 0 0 0 0 3  Moving slowly or fidgety/restless 0 0 2 0 3  Suicidal thoughts 0 0 0 0 0  PHQ-9 Score 12 9 12 15 24   Difficult doing work/chores Not difficult at all Somewhat difficult Somewhat difficult Very difficult Extremely dIfficult        11/17/2023    8:52 AM  Fall Risk   Falls in the past year? 0  Number falls in past yr: 0  Injury with Fall? 0  Risk for fall due to : No Fall Risks  Follow up Falls evaluation completed    Patient Care Team: Sherre Clapper, MD as PCP - General (Family Medicine) Launie, Alexa Shad, MD as Referring Physician (Cardiology) Towana Charleston, MD (Gastroenterology) Skeet Juliene SAUNDERS, DO as Consulting Physician (Neurology) Ishmael Slough, MD as Consulting Physician (Rheumatology) Sheela Noreen Raddle., MD (Surgery) Powers, Marsa POUR, MD as Referring Physician (Neurosurgery) Ival Domino, FNP as Nurse Practitioner (Urology)   Review of Systems  Constitutional:  Negative for appetite change, fatigue and fever.  HENT:  Negative for congestion, ear pain, sinus pressure and sore throat.   Respiratory:  Negative for cough, chest tightness, shortness of breath and wheezing.   Cardiovascular:  Negative for chest pain and palpitations.  Gastrointestinal:  Negative for abdominal pain, constipation, diarrhea, nausea and vomiting.  Genitourinary:  Negative for dysuria and hematuria.  Musculoskeletal:  Negative for arthralgias, back pain, joint swelling and myalgias.  Skin:  Negative for rash.  Neurological:  Negative for dizziness, weakness and headaches.  Psychiatric/Behavioral:  Negative for dysphoric mood. The patient is not nervous/anxious.     Current Outpatient Medications on File Prior to Visit  Medication Sig Dispense Refill   acetaminophen  (TYLENOL ) 325 MG tablet Take by mouth.     albuterol  (VENTOLIN  HFA) 108 (90 Base) MCG/ACT inhaler Inhale 2 puffs into the lungs every 6 (six)  hours as needed. 18 g 1   atorvastatin  (LIPITOR) 80 MG tablet Take 1 tablet (80 mg total) by mouth daily. 90 tablet 1   citalopram  (CELEXA ) 10 MG tablet Take 1 tablet (10 mg total) by mouth daily. 30 tablet 3   clopidogrel  (PLAVIX ) 75 MG tablet Take 1 tablet by mouth once daily 90 tablet 3   dicyclomine  (BENTYL ) 20 MG tablet Take 1 tablet (20 mg total) by mouth 4 (four) times daily -  before meals and at bedtime. 360 tablet 1   diltiazem  (CARDIZEM  CD) 120 MG 24 hr capsule Take 1 capsule (120 mg total) by mouth daily. 90 capsule 1   Fluticasone-Umeclidin-Vilant (TRELEGY ELLIPTA ) 100-62.5-25 MCG/ACT AEPB Inhale into the lungs.     furosemide  (LASIX ) 40 MG tablet Take 2 tablets by mouth twice daily 360 tablet 0   gabapentin  (NEURONTIN ) 800 MG tablet Take 1 tablet (800 mg total) by mouth 3 (three) times daily. 270 tablet 1   levETIRAcetam  (KEPPRA ) 500 MG tablet Take 1 tablet (500 mg total) by mouth 2 (two) times daily. 60 tablet 3   meclizine  (ANTIVERT ) 25 MG tablet Take 1 tablet (25 mg total) by mouth every 8 (eight) hours as needed for dizziness. 30 tablet 2   nitroGLYCERIN (NITROSTAT) 0.4 MG SL tablet Place under the tongue. (Patient taking differently: Place 0.4 mg under the tongue every 5 (five) minutes as needed for chest pain.)     pantoprazole  (PROTONIX ) 40 MG tablet Take 1 tablet (40 mg total) by mouth daily. 90 tablet 3   potassium chloride  SA (KLOR-CON  M) 20 MEQ tablet Take 1 tablet (20 mEq total) by mouth 2 (two) times daily with a meal. 180 tablet 0   ranolazine  (RANEXA ) 500 MG 12 hr tablet Take 1 tablet (500 mg total) by mouth 2 (two) times daily. 180 tablet 1   Romosozumab -aqqg (EVENITY ) 105 MG/1.17ML SOSY injection Inject 210 mg into the skin once.     Current Facility-Administered Medications on File Prior to Visit  Medication Dose Route Frequency Provider Last Rate Last Admin   Romosozumab -aqqg (EVENITY ) 105 MG/1. injection 210 mg  210 mg Subcutaneous Q30 days Sherre Clapper, MD    210 mg at 11/17/23 1018   Past Medical History:  Diagnosis Date   A-fib [facility])    Atherosclerosis of both carotid arteries 2022   noted per CT on 05/14/20   Chronic pain syndrome    Chronic systolic heart failure (HCC)    Depression    Emphysema of lung (HCC) 2022   noted per CT scan on 05/14/20   Generalized atherosclerosis    GERD (gastroesophageal reflux disease)    Hypertension    IBS (irritable bowel syndrome)    Melanoma (HCC)    Other specified nontoxic goiter    Palpitations    PMR (polymyalgia rheumatica) (HCC) 07/04/2020   Polymyalgia rheumatica (HCC)  Primary pulmonary hypertension (HCC)    Pulmonary embolism (HCC)    Stroke (HCC)    ministroke times 3 Right side weakness   Supraventricular tachycardia (HCC)    Type 2 diabetes mellitus (HCC)    diet controlled   Past Surgical History:  Procedure Laterality Date   ABDOMINAL HYSTERECTOMY     APPENDECTOMY     CATARACT EXTRACTION     CORONARY ANGIOPLASTY WITH STENT PLACEMENT  2021   KYPHOPLASTY  05/2020   L4-L5   TRIGGER FINGER RELEASE Right 03/17/2023   Procedure: right middle and ring finger RELEASE TRIGGER FINGER/A-1 PULLEY;  Surgeon: Romona Harari, MD;  Location: [facility];  Service: Orthopedics;  Laterality: Right;  mac and local    Family History  Problem Relation Age of Onset   Heart disease Mother    Diabetes Mother    Hyperlipidemia Mother    Hypertension Mother    Stroke Mother    Heart attack Mother    Heart disease Brother    Heart attack Brother    Stroke Brother    Hypertension Brother    Hyperlipidemia Brother    Social History   Socioeconomic History   Marital status: Single    Spouse name: Not on file   Number of children: 2   Years of education: Not on file   Highest education level: Not on file  Occupational History   Occupation: retired  Tobacco Use   Smoking status: Every Day    Current packs/day: 0.50    Average packs/day: 0.4 packs/day for 160.6  years (65.3 ttl pk-yrs)    Types: Cigarettes    Start date: 1965   Smokeless tobacco: Never   Tobacco comments:    Smokes 1/2 pack per day  Vaping Use   Vaping status: Never Used  Substance and Sexual Activity   Alcohol use: No    Alcohol/week: 0.0 standard drinks of alcohol   Drug use: No   Sexual activity: Not Currently  Other Topics Concern   Not on file  Social History Narrative   Not on file   Social Drivers of Health   Financial Resource Strain: Low Risk  (10/11/2023)   Received from [facility]   Overall Financial Resource Strain (CARDIA)    Difficulty of Paying Living Expenses: Not hard at all  Food Insecurity: No Food Insecurity (10/11/2023)   Received from [facility]   Hunger Vital Sign    Within the past 12 months, you worried that your food would run out before you got the money to buy more.: Never true    Within the past 12 months, the food you bought just didn't last and you didn't have money to get more.: Never true  Transportation Needs: No Transportation Needs (10/11/2023)   Received from [facility] - Transportation    Lack of Transportation (Medical): No    Lack of Transportation (Non-Medical): No  Physical Activity: Inactive (06/08/2023)   Exercise Vital Sign    Days of Exercise per Week: 0 days    Minutes of Exercise per Session: 0 min  Stress: No Stress Concern Present (06/08/2023)   [employer] of [employer]ational [employer] - Occupational Stress Questionnaire    Feeling of Stress : Not at all  Social Connections: Socially Integrated (03/16/2022)   Received from [facility]   Social Network    How would you rate your social network (family, work, friends)?: Good participation with social networks    Objective:  BP  118/62 (BP Location: Left Arm, Patient Position: Sitting)   Pulse 68   Temp 97.8 F (36.6 C) (Temporal)   LMP  (LMP Unknown)      11/17/2023    8:46 AM 10/05/2023    1:08 PM 08/11/2023    9:44 AM  BP/Weight  Systolic BP  118 889 110  Diastolic BP 62 62 64  Wt. (Lbs)  132 129  BMI  24.54 kg/m2 23.98 kg/m2    Physical Exam Vitals reviewed.  Constitutional:      Appearance: Normal appearance. She is normal weight.  Neck:     Vascular: No carotid bruit.  Cardiovascular:     Rate and Rhythm: Normal rate and regular rhythm.     Heart sounds: Normal heart sounds.  Pulmonary:     Effort: Pulmonary effort is normal. No respiratory distress.     Breath sounds: Normal breath sounds.  Abdominal:     General: Abdomen is flat. Bowel sounds are normal.     Palpations: Abdomen is soft.     Tenderness: There is no abdominal tenderness.  Musculoskeletal:        General: Deformity (KYPHOSIS) present.  Neurological:     Mental Status: She is alert and oriented to person, place, and time.  Psychiatric:        Mood and Affect: Mood normal.        Behavior: Behavior normal.         Lab Results  Component Value Date   WBC 4.5 11/17/2023   HGB 15.2 11/17/2023   HCT 45.9 11/17/2023   PLT 125 (L) 11/17/2023   GLUCOSE 101 (H) 11/17/2023   CHOL 167 11/17/2023   TRIG 125 11/17/2023   HDL 56 11/17/2023   LDLCALC 89 11/17/2023   ALT 15 11/17/2023   AST 41 (H) 11/17/2023   NA 140 11/17/2023   K 3.5 11/17/2023   CL 98 11/17/2023   CREATININE 1.10 (H) 11/17/2023   BUN 13 11/17/2023   CO2 26 11/17/2023   TSH 1.020 11/25/2022   HGBA1C 5.8 (H) 11/17/2023   MICROALBUR 30 01/20/2020      Assessment & Plan:  Hypertensive heart disease with heart failure (HCC) Assessment & Plan: Occasional exertional chest pain relieved by rest. - Instruct to use nitroglycerin if chest pain does not resolve within five minutes of rest. - Monitor frequency and severity of chest pain. - continue ranexa , lipitor, plavix ,  - recommend call cardiology if continued episodes of angina.   Orders: -     amLODIPine  Besylate; Take 1 tablet (2.5 mg total) by mouth daily.  Dispense: 90 tablet; Refill: 1 -     CBC with  Differential/Platelet -     Comprehensive metabolic panel with GFR  COPD mixed type (HCC) Assessment & Plan: Recommend STOP SMOKING.  Encouraged trelegy use.    Mixed hyperlipidemia Assessment & Plan: Well controlled.  No changes to medicines. Continue atorvastatin  80 mg before bed.  Continue to work on eating a healthy diet and exercise.  Labs drawn today.    Orders: -     Lipid panel  Prediabetes Assessment & Plan: Recommend continue to work on eating healthy diet Unable to exercise.   Orders: -     Hemoglobin A1c  Uncomplicated opioid dependence (HCC) Assessment & Plan: Continue mscontin.  Have sign new pain contract at next visit.  Has narcan  at home, but is old so sent refill.  Orders: -     Pain Mgt Scrn (14 Drugs), Ur -  Naloxone  HCl; 1 spray (4 mg) intranasally into 1 nostril. Use a new Narcan (R) nasal spray for subsequent doses and administer into alternating nostrils. May repeat every 2 to 3 minutes  Dispense: 2 each; Refill: 1 -     Specimen status report  Moderate recurrent major depression (HCC) Assessment & Plan: Controlled fairly well. Continue to monitor symptoms Continue taking Celexa  10mg  daily. Max dose.  Will adjust treatment depending on symptoms   Rheumatoid arthritis involving both hands, unspecified whether rheumatoid factor present [facility]) Assessment & Plan: Management per specialist.     Chronic midline thoracic back pain Assessment & Plan: Continue mscontin.  Have sign new pain contract at next visit.  Has narcan  at home, but is old so sent refill.  Orders: -     Morphine  Sulfate ER; Take 1 tablet (15 mg total) by mouth every 12 (twelve) hours.  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 No nonsteroid antiinflammatories, such as naproxen (aleve) or ibuprofen  (advil.).     Meds ordered this encounter  Medications   morphine  (MS CONTIN ) 15 MG 12 hr tablet    Sig: Take 1 tablet (15 mg total) by mouth every 12 (twelve) hours.    Dispense:  60 tablet    Refill:  0   amLODipine  (NORVASC ) 2.5 MG tablet    Sig: Take 1 tablet (2.5 mg total) by mouth daily.    Dispense:  90 tablet    Refill:  1   naloxone  (NARCAN ) nasal spray 4 mg/0.1 mL    Sig: 1 spray (4 mg) intranasally into 1 nostril. Use a new Narcan (R) nasal spray for subsequent doses and administer into alternating nostrils. May repeat every 2 to 3 minutes    Dispense:  2 each    Refill:  1    Orders Placed This Encounter  Procedures   CBC with Differential/Platelet   Comprehensive metabolic panel with GFR   Lipid panel   Hemoglobin A1c   Pain Mgt Scrn (14 Drugs), Ur   Specimen status report     Follow-up: Return in about 3 months (around 02/17/2024) for chronic follow up.    An After Visit Summary was printed and given to the patient.  Abigail Free, MD [facility] [phone]

## 2023-11-18 ENCOUNTER — Ambulatory Visit: Payer: Self-pay | Admitting: Family Medicine

## 2023-11-18 LAB — PAIN MGT SCRN (14 DRUGS), UR
Amphetamine Scrn, Ur: NEGATIVE ng/mL
BARBITURATE SCREEN URINE: NEGATIVE ng/mL
BENZODIAZEPINE SCREEN, URINE: NEGATIVE ng/mL
Buprenorphine, Urine: NEGATIVE ng/mL
CANNABINOIDS UR QL SCN: NEGATIVE ng/mL
Cocaine (Metab) Scrn, Ur: NEGATIVE ng/mL
Creatinine(Crt), U: 44.8 mg/dL (ref 20.0–300.0)
Fentanyl, Urine: NEGATIVE pg/mL
Meperidine Screen, Urine: NEGATIVE ng/mL
Methadone Screen, Urine: NEGATIVE ng/mL
OXYCODONE+OXYMORPHONE UR QL SCN: NEGATIVE ng/mL
Opiate Scrn, Ur: POSITIVE ng/mL — AB
Ph of Urine: 5.8 (ref 4.5–8.9)
Phencyclidine Qn, Ur: NEGATIVE ng/mL
Propoxyphene Scrn, Ur: NEGATIVE ng/mL
Tramadol Screen, Urine: NEGATIVE ng/mL

## 2023-11-18 LAB — LIPID PANEL
Chol/HDL Ratio: 3 ratio (ref 0.0–4.4)
Cholesterol, Total: 167 mg/dL (ref 100–199)
HDL: 56 mg/dL (ref >39)
LDL Chol Calc (NIH): 89 mg/dL (ref 0–99)
Triglycerides: 125 mg/dL (ref 0–149)
VLDL Cholesterol Cal: 22 mg/dL (ref 5–40)

## 2023-11-18 LAB — SPECIMEN STATUS REPORT

## 2023-11-18 LAB — CBC WITH DIFFERENTIAL/PLATELET
Basophils Absolute: 0.1 x10E3/uL (ref 0.0–0.2)
Basos: 1 %
EOS (ABSOLUTE): 0.4 x10E3/uL (ref 0.0–0.4)
Eos: 8 %
Hematocrit: 45.9 % (ref 34.0–46.6)
Hemoglobin: 15.2 g/dL (ref 11.1–15.9)
Immature Grans (Abs): 0 x10E3/uL (ref 0.0–0.1)
Immature Granulocytes: 0 %
Lymphocytes Absolute: 1.3 x10E3/uL (ref 0.7–3.1)
Lymphs: 29 %
MCH: 32.6 pg (ref 26.6–33.0)
MCHC: 33.1 g/dL (ref 31.5–35.7)
MCV: 99 fL — ABNORMAL HIGH (ref 79–97)
Monocytes Absolute: 0.6 x10E3/uL (ref 0.1–0.9)
Monocytes: 13 %
Neutrophils Absolute: 2.2 x10E3/uL (ref 1.4–7.0)
Neutrophils: 49 %
Platelets: 125 x10E3/uL — ABNORMAL LOW (ref 150–450)
RBC: 4.66 x10E6/uL (ref 3.77–5.28)
RDW: 12.9 % (ref 11.7–15.4)
WBC: 4.5 x10E3/uL (ref 3.4–10.8)

## 2023-11-18 LAB — COMPREHENSIVE METABOLIC PANEL WITH GFR
ALT: 15 IU/L (ref 0–32)
AST: 41 IU/L — ABNORMAL HIGH (ref 0–40)
Albumin: 4.5 g/dL (ref 3.8–4.8)
Alkaline Phosphatase: 108 IU/L (ref 44–121)
BUN/Creatinine Ratio: 12 (ref 12–28)
BUN: 13 mg/dL (ref 8–27)
Bilirubin Total: 0.5 mg/dL (ref 0.0–1.2)
CO2: 26 mmol/L (ref 20–29)
Calcium: 9.4 mg/dL (ref 8.7–10.3)
Chloride: 98 mmol/L (ref 96–106)
Creatinine, Ser: 1.1 mg/dL — ABNORMAL HIGH (ref 0.57–1.00)
Globulin, Total: 2.2 g/dL (ref 1.5–4.5)
Glucose: 101 mg/dL — ABNORMAL HIGH (ref 70–99)
Potassium: 3.5 mmol/L (ref 3.5–5.2)
Sodium: 140 mmol/L (ref 134–144)
Total Protein: 6.7 g/dL (ref 6.0–8.5)
eGFR: 51 mL/min/1.73 — ABNORMAL LOW (ref >59)

## 2023-11-18 LAB — HEMOGLOBIN A1C
Est. average glucose Bld gHb Est-mCnc: 120 mg/dL
Hgb A1c MFr Bld: 5.8 % — ABNORMAL HIGH (ref 4.8–5.6)

## 2023-11-19 ENCOUNTER — Other Ambulatory Visit: Payer: Self-pay

## 2023-11-19 DIAGNOSIS — D696 Thrombocytopenia, unspecified: Secondary | ICD-10-CM

## 2023-11-20 ENCOUNTER — Encounter: Payer: Self-pay | Admitting: Family Medicine

## 2023-11-20 NOTE — Assessment & Plan Note (Signed)
 Controlled fairly well. Continue to monitor symptoms Continue taking Celexa  10mg  daily. Max dose.  Will adjust treatment depending on symptoms

## 2023-11-20 NOTE — Assessment & Plan Note (Signed)
 Occasional exertional chest pain relieved by rest. - Instruct to use nitroglycerin if chest pain does not resolve within five minutes of rest. - Monitor frequency and severity of chest pain. - continue ranexa , lipitor, plavix ,  - recommend call cardiology if continued episodes of angina.

## 2023-11-20 NOTE — Assessment & Plan Note (Signed)
 Continue mscontin.  Have sign new pain contract at next visit.  Has narcan  at home, but is old so sent refill.

## 2023-11-20 NOTE — Assessment & Plan Note (Signed)
 Continue with current treatment - eventity

## 2023-11-20 NOTE — Assessment & Plan Note (Signed)
 Recommend continue to work on eating healthy diet Unable to exercise.

## 2023-11-20 NOTE — Assessment & Plan Note (Signed)
 Recommend STOP SMOKING.  Encouraged trelegy use.

## 2023-11-20 NOTE — Assessment & Plan Note (Signed)
 Well controlled.  No changes to medicines. Continue atorvastatin  80 mg before bed.  Continue to work on eating a healthy diet and exercise.  Labs drawn today.

## 2023-11-20 NOTE — Assessment & Plan Note (Signed)
 Management per specialist.

## 2023-11-20 NOTE — Assessment & Plan Note (Signed)
 Stable. No nonsteroid antiinflammatories, such as naproxen (aleve) or ibuprofen (advil.).

## 2023-11-24 ENCOUNTER — Telehealth: Payer: Self-pay | Admitting: Family Medicine

## 2023-11-24 ENCOUNTER — Other Ambulatory Visit: Payer: Self-pay

## 2023-11-24 DIAGNOSIS — I110 Hypertensive heart disease with heart failure: Secondary | ICD-10-CM

## 2023-11-24 DIAGNOSIS — M0600 Rheumatoid arthritis without rheumatoid factor, unspecified site: Secondary | ICD-10-CM | POA: Diagnosis not present

## 2023-11-24 DIAGNOSIS — F331 Major depressive disorder, recurrent, moderate: Secondary | ICD-10-CM

## 2023-11-24 MED ORDER — CITALOPRAM HYDROBROMIDE 10 MG PO TABS: 10.0000 mg | ORAL_TABLET | Freq: Every day | ORAL | 3 refills | Status: DC

## 2023-11-24 MED ORDER — AMLODIPINE BESYLATE 2.5 MG PO TABS: 2.5000 mg | ORAL_TABLET | Freq: Every day | ORAL | 1 refills | Status: AC

## 2023-11-24 NOTE — Telephone Encounter (Signed)
 Prescription Request  11/24/2023  LOV: 11/17/2023  What is the name of the medication or equipment? citalopram  (CELEXA ) 10 MG tablet  AND                  amLODipine  (NORVASC ) 2.5 MG tablet   Have you contacted your pharmacy to request a refill? No   Which pharmacy would you like this sent to?   [facility] [address], [place] - [address] [place] [address] Phone: [phone] Fax: [phone]  Patient notified that their request is being sent to the clinical staff for review and that they should receive a response within 2 business days.   Please advise at [facility] [phone]

## 2023-11-29 LAB — HM DIABETES EYE EXAM

## 2023-12-01 ENCOUNTER — Telehealth: Payer: Self-pay | Admitting: Family Medicine

## 2023-12-01 DIAGNOSIS — M797 Fibromyalgia: Secondary | ICD-10-CM | POA: Diagnosis not present

## 2023-12-01 DIAGNOSIS — Z6822 Body mass index (BMI) 22.0-22.9, adult: Secondary | ICD-10-CM | POA: Diagnosis not present

## 2023-12-01 DIAGNOSIS — M0600 Rheumatoid arthritis without rheumatoid factor, unspecified site: Secondary | ICD-10-CM | POA: Diagnosis not present

## 2023-12-01 DIAGNOSIS — Z79899 Other long term (current) drug therapy: Secondary | ICD-10-CM | POA: Diagnosis not present

## 2023-12-01 DIAGNOSIS — M1991 Primary osteoarthritis, unspecified site: Secondary | ICD-10-CM | POA: Diagnosis not present

## 2023-12-01 MED ORDER — DILTIAZEM HCL ER COATED BEADS 120 MG PO CP24: 120.0000 mg | ORAL_CAPSULE | Freq: Every day | ORAL | 1 refills | Status: DC

## 2023-12-01 NOTE — Telephone Encounter (Signed)
 Prescription Request  12/01/2023  LOV: 11/17/2023  What is the name of the medication or equipment? diltiazem  (CARDIZEM  CD) 120 MG 24 hr capsule   Have you contacted your pharmacy to request a refill? No   Which pharmacy would you like this sent to?   [facility] [address], [place] - [address] [place] [address] Phone: [phone] Fax: [phone]  Patient notified that their request is being sent to the clinical staff for review and that they should receive a response within 2 business days.   Please advise at [facility] [phone]

## 2023-12-01 NOTE — Addendum Note (Signed)
 Addended by: TWILLA DODRILL C on: 12/01/2023 04:26 PM   Modules accepted: Orders

## 2023-12-02 DIAGNOSIS — H40013 Open angle with borderline findings, low risk, bilateral: Secondary | ICD-10-CM | POA: Diagnosis not present

## 2023-12-02 DIAGNOSIS — H5213 Myopia, bilateral: Secondary | ICD-10-CM | POA: Diagnosis not present

## 2023-12-07 ENCOUNTER — Encounter (HOSPITAL_BASED_OUTPATIENT_CLINIC_OR_DEPARTMENT_OTHER): Payer: Self-pay

## 2023-12-07 ENCOUNTER — Ambulatory Visit (HOSPITAL_BASED_OUTPATIENT_CLINIC_OR_DEPARTMENT_OTHER): Payer: Self-pay | Admitting: Family Medicine

## 2023-12-07 ENCOUNTER — Ambulatory Visit (HOSPITAL_BASED_OUTPATIENT_CLINIC_OR_DEPARTMENT_OTHER): Admit: 2023-12-07 | Discharge: 2023-12-07 | Disposition: A | Discharge status: Home / Self Care | Admitting: Radiology

## 2023-12-07 ENCOUNTER — Other Ambulatory Visit (HOSPITAL_BASED_OUTPATIENT_CLINIC_OR_DEPARTMENT_OTHER): Payer: Self-pay

## 2023-12-07 ENCOUNTER — Ambulatory Visit (HOSPITAL_BASED_OUTPATIENT_CLINIC_OR_DEPARTMENT_OTHER)
Admission: EM | Admit: 2023-12-07 | Discharge: 2023-12-07 | Disposition: A | Discharge status: Home / Self Care | Attending: Family Medicine | Admitting: Family Medicine

## 2023-12-07 DIAGNOSIS — R197 Diarrhea, unspecified: Secondary | ICD-10-CM | POA: Diagnosis not present

## 2023-12-07 DIAGNOSIS — Z981 Arthrodesis status: Secondary | ICD-10-CM | POA: Diagnosis not present

## 2023-12-07 DIAGNOSIS — R059 Cough, unspecified: Secondary | ICD-10-CM | POA: Diagnosis not present

## 2023-12-07 DIAGNOSIS — R051 Acute cough: Secondary | ICD-10-CM | POA: Insufficient documentation

## 2023-12-07 DIAGNOSIS — R0602 Shortness of breath: Secondary | ICD-10-CM | POA: Diagnosis not present

## 2023-12-07 DIAGNOSIS — R350 Frequency of micturition: Secondary | ICD-10-CM | POA: Insufficient documentation

## 2023-12-07 DIAGNOSIS — J209 Acute bronchitis, unspecified: Secondary | ICD-10-CM | POA: Diagnosis not present

## 2023-12-07 DIAGNOSIS — R531 Weakness: Secondary | ICD-10-CM | POA: Insufficient documentation

## 2023-12-07 DIAGNOSIS — I700 Atherosclerosis of aorta: Secondary | ICD-10-CM | POA: Diagnosis not present

## 2023-12-07 LAB — POCT URINE DIPSTICK
Bilirubin, UA: NEGATIVE
Blood, UA: NEGATIVE
Glucose, UA: NEGATIVE mg/dL
Ketones, POC UA: NEGATIVE mg/dL
Leukocytes, UA: NEGATIVE
Nitrite, UA: NEGATIVE
Spec Grav, UA: 1.025 (ref 1.010–1.025)
Urobilinogen, UA: 0.2 U/dL
pH, UA: 5.5 (ref 5.0–8.0)

## 2023-12-07 MED ORDER — DOXYCYCLINE HYCLATE 100 MG PO CAPS
100.0000 mg | ORAL_CAPSULE | Freq: Two times a day (BID) | ORAL | 0 refills | Status: DC
  Filled 2023-12-07: qty 20, 10d supply, fill #0

## 2023-12-07 MED ORDER — IPRATROPIUM-ALBUTEROL 0.5-2.5 (3) MG/3ML IN SOLN
3.0000 mL | Freq: Once | RESPIRATORY_TRACT | Status: AC
  Administered 2023-12-07: 3 mL via RESPIRATORY_TRACT

## 2023-12-07 MED ORDER — IPRATROPIUM-ALBUTEROL 0.5-2.5 (3) MG/3ML IN SOLN
3.0000 mL | Freq: Four times a day (QID) | RESPIRATORY_TRACT | 0 refills | Status: AC | PRN
Start: 2023-12-07 — End: ?

## 2023-12-07 MED ORDER — PROMETHAZINE-DM 6.25-15 MG/5ML PO SYRP
5.0000 mL | ORAL_SOLUTION | Freq: Four times a day (QID) | ORAL | 0 refills | Status: AC | PRN
  Filled 2023-12-07: qty 118, 6d supply, fill #0

## 2023-12-07 MED ORDER — PREDNISONE 20 MG PO TABS
20.0000 mg | ORAL_TABLET | Freq: Every day | ORAL | 0 refills | Status: AC
  Filled 2023-12-07: qty 5, 5d supply, fill #0

## 2023-12-07 NOTE — Progress Notes (Signed)
 Negative film.  Patient was updated of results during the visit and her son was also updated.  Her clinical picture is more consistent with early pneumonia and she was treated for pneumonia but will remain on inhalers also.

## 2023-12-07 NOTE — Discharge Instructions (Addendum)
 Acute bronchitis with cough versus early pneumonia: X-ray shows changes of bronchitis but no sign of consolidation or pneumonia.  Get plenty of fluids and rest.  Based on the exam and breath sounds, I believe she may have early bronchial pneumonia.  Will treat with doxycycline  100 mg twice daily for 10 days.  Get plenty of fluids and rest.  Promethazine  DM, 5 mL, every 6 hours as needed for cough.  Provided DuoNeb solution for hand-held nebulizer machine to be used every 4-6 hours if needed for cough or wheezing.  Weakness, diarrhea and urinary frequency: Patient has a moderate burden of stool.  It would be beneficial for her to get empty of stool.  She could use MiraLAX 1 capful twice daily for 3 to 4 days to get empty of stool.  I know she is having 3 stools that are loose a day but she still full of poop.  Get plenty of liquids.  Urinary frequency: Urinalysis looks essentially normal.  Urine culture sent due to symptoms and history of chronic UTI.  Will adjust the plan of care, if needed once the culture results.  Follow-up here as needed.  Follow-up if symptoms do not improve, worsen or new symptoms occur.  Follow-up in 2 to 3 weeks with primary care for lung assessment or sooner as needed.

## 2023-12-07 NOTE — ED Triage Notes (Signed)
 Pt states she has been having diarrhea since last week. She has been having 2-3 times per day since it started and is worst when she eats. She also states she thinks she has a UTI. She has hx of chronic UTIs so she test every so often and it showed that she has a UTI. Pt denies hematuria.

## 2023-12-07 NOTE — ED Provider Notes (Signed)
 [facility]    CSN: [id number] Arrival date & time: 12/07/23  1449      History   Chief Complaint Chief Complaint  Patient presents with   Diarrhea   Urinary Frequency    HPI Donna Little is a 78 y.o. female.   78 year old female who is a patient of mine at urology for several years until I left in the fall 2024.  She came to urgent care with history of chronic UTIs and frequency/urgency of urination.  She has had loose stools or diarrhea 2 or 3 times a day for at least a week (since 11/30/2023 or earlier.  She denies hematuria.  She has bodyaches and just feels ill.  She thinks she feels ill because she has a UTI.   Diarrhea Associated symptoms: abdominal pain and arthralgias   Associated symptoms: no chills, no fever and no vomiting   Urinary Frequency Associated symptoms include abdominal pain and shortness of breath. Pertinent negatives include no chest pain.    Past Medical History:  Diagnosis Date   A-fib [facility])    Atherosclerosis of both carotid arteries 2022   noted per CT on 05/14/20   Chronic pain syndrome    Chronic systolic heart failure (HCC)    Depression    Emphysema of lung (HCC) 2022   noted per CT scan on 05/14/20   Generalized atherosclerosis    GERD (gastroesophageal reflux disease)    Hypertension    IBS (irritable bowel syndrome)    Melanoma (HCC)    Other specified nontoxic goiter    Palpitations    PMR (polymyalgia rheumatica) (HCC) 07/04/2020   Polymyalgia rheumatica (HCC)    Primary pulmonary hypertension (HCC)    Pulmonary embolism (HCC)    Stroke (HCC)    ministroke times 3 Right side weakness   Supraventricular tachycardia (HCC)    Type 2 diabetes mellitus (HCC)    diet controlled    Patient Active Problem List   Diagnosis Date Noted   Impingement syndrome of right shoulder region 10/05/2023   Rheumatoid arthritis involving both hands (HCC) 10/05/2023   Swelling of left hand 08/12/2023   Acute respiratory failure with  hypoxia (HCC) 08/09/2023   Pneumonia due to infectious organism 08/09/2023   Minor head trauma 07/15/2023   Right hand pain 06/13/2023   Coronary artery disease due to lipid rich plaque 06/13/2023   Personal history of tobacco use, presenting hazards to health 06/13/2023   Chronic fatigue 04/12/2023   Primary osteoarthritis 04/12/2023   Chronic kidney disease, stage 3a (HCC) 02/22/2023   Arthralgia of multiple sites 02/22/2023   Borderline diabetes 02/22/2023   Localized swelling, mass or lump of neck 02/22/2023   Acquired trigger finger of right ring finger 02/15/2023   Acquired trigger finger of right middle finger 02/15/2023   Fracture of phalanx of finger 02/15/2023   Hand pain, left 02/15/2023   Cigarette nicotine dependence with nicotine-induced disorder 12/19/2022   Encounter for Medicare annual wellness exam 12/16/2022   Encounter for osteoporosis screening in asymptomatic postmenopausal patient 12/16/2022   Immunization due 12/16/2022   Screening for lung cancer 12/16/2022   Fracture of unspecified phalanx of right middle finger, initial encounter for closed fracture 12/02/2022   Flank pain 12/02/2022   Other chest pain 06/03/2022   Paresthesia 06/03/2022   Vitamin D  insufficiency 06/03/2022   Moderate recurrent major depression (HCC) 04/22/2022   Chronic tension-type headache, intractable 02/18/2022   Lymphadenopathy, cervical 12/14/2021   Chronic nonintractable headache 12/11/2021  Altered mental status 12/11/2021   Memory loss 12/11/2021   Trigger finger, right middle finger 05/05/2021   Telogen effluvium 03/15/2021   Uncomplicated opioid dependence (HCC) 03/15/2021   Aortic atherosclerosis (HCC) 03/15/2021   Intracranial vascular disease 03/15/2021   Chronic midline thoracic back pain 03/15/2021   Osteoporosis, 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   COPD mixed type (HCC) 03/15/2021   GERD (gastroesophageal reflux disease)    Bruit 08/28/2020   S/P kyphoplasty 07/05/2020    Mild recurrent major depression (HCC) 06/27/2020   Prediabetes 06/27/2020   Right hip pain 09/21/2019   Back pain with sciatica 08/21/2019   Incontinence of urine in female 08/21/2019   Trigger finger, right ring finger 06/26/2019   Cerebrovascular disease 01/16/2015   Hyperlipidemia 01/16/2015   Hypertensive heart disease with heart failure (HCC) 01/16/2015   Tobacco use disorder 01/16/2015    Past Surgical History:  Procedure Laterality Date   ABDOMINAL HYSTERECTOMY     APPENDECTOMY     CATARACT EXTRACTION     CORONARY ANGIOPLASTY WITH STENT PLACEMENT  2021   KYPHOPLASTY  05/2020   L4-L5   TRIGGER FINGER RELEASE Right 03/17/2023   Procedure: right middle and ring finger RELEASE TRIGGER FINGER/A-1 PULLEY;  Surgeon: Romona Harari, MD;  Location: [facility];  Service: Orthopedics;  Laterality: Right;  mac and local    OB History   No obstetric history on file.      Home Medications    Prior to Admission medications   Medication Sig Start Date End Date Taking? Authorizing Provider  doxycycline  (VIBRAMYCIN ) 100 MG capsule Take 1 capsule (100 mg total) by mouth 2 (two) times daily for 10 days. 12/07/23 12/17/23 Yes Ival Domino, FNP  ipratropium-albuterol  (DUONEB) 0.5-2.5 (3) MG/3ML SOLN Take 3 mLs by nebulization every 6 (six) hours as needed. 12/07/23  Yes Ival Domino, FNP  predniSONE  (DELTASONE ) 20 MG tablet Take 1 tablet (20 mg total) by mouth daily with breakfast for 5 days. 12/07/23 12/12/23 Yes Ival Domino, FNP  promethazine -dextromethorphan (PROMETHAZINE -DM) 6.25-15 MG/5ML syrup Take 5 mLs by mouth 4 (four) times daily as needed for cough. Do not use and drive - May make drowsy. 12/07/23  Yes Ival Domino, FNP  acetaminophen  (TYLENOL ) 325 MG tablet Take by mouth. 05/09/20   [provider]  albuterol  (VENTOLIN  HFA) 108 (90 Base) MCG/ACT inhaler Inhale 2 puffs into the lungs every 6 (six) hours as needed. 06/03/22   CoxAbigail, MD  amLODipine   (NORVASC ) 2.5 MG tablet Take 1 tablet (2.5 mg total) by mouth daily. 11/24/23   CoxAbigail, MD  atorvastatin  (LIPITOR) 80 MG tablet Take 1 tablet (80 mg total) by mouth daily. 06/21/23   CoxAbigail, MD  citalopram  (CELEXA ) 10 MG tablet Take 1 tablet (10 mg total) by mouth daily. 11/24/23   Sherre Abigail, MD  clopidogrel  (PLAVIX ) 75 MG tablet Take 1 tablet by mouth once daily 11/11/23   Cox, Abigail, MD  dicyclomine  (BENTYL ) 20 MG tablet Take 1 tablet (20 mg total) by mouth 4 (four) times daily -  before meals and at bedtime. 09/01/23   CoxAbigail, MD  diltiazem  (CARDIZEM  CD) 120 MG 24 hr capsule Take 1 capsule (120 mg total) by mouth daily. 12/01/23   Sherre Abigail, MD  Fluticasone-Umeclidin-Vilant (TRELEGY ELLIPTA ) 100-62.5-25 MCG/ACT AEPB Inhale into the lungs.    [provider]  furosemide  (LASIX ) 40 MG tablet Take 2 tablets by mouth twice daily 11/07/23   Cox, Kirsten, MD  gabapentin  (NEURONTIN ) 800 MG tablet Take 1  tablet (800 mg total) by mouth 3 (three) times daily. 09/15/23   Cox, Abigail, MD  levETIRAcetam  (KEPPRA ) 500 MG tablet Take 1 tablet (500 mg total) by mouth 2 (two) times daily. 10/14/23   CoxAbigail, MD  meclizine  (ANTIVERT ) 25 MG tablet Take 1 tablet (25 mg total) by mouth every 8 (eight) hours as needed for dizziness. 07/29/22   CoxAbigail, MD  morphine  (MS CONTIN ) 15 MG 12 hr tablet Take 1 tablet (15 mg total) by mouth every 12 (twelve) hours. 11/17/23   CoxAbigail, MD  naloxone  (NARCAN ) nasal spray 4 mg/0.1 mL 1 spray (4 mg) intranasally into 1 nostril. Use a new Narcan (R) nasal spray for subsequent doses and administer into alternating nostrils. May repeat every 2 to 3 minutes 11/17/23   Cox, Kirsten, MD  nitroGLYCERIN (NITROSTAT) 0.4 MG SL tablet Place under the tongue. Patient taking differently: Place 0.4 mg under the tongue every 5 (five) minutes as needed for chest pain.    [provider]  pantoprazole  (PROTONIX ) 40 MG tablet Take 1 tablet (40 mg total) by  mouth daily. 10/20/23   CoxAbigail, MD  potassium chloride  SA (KLOR-CON  M) 20 MEQ tablet Take 1 tablet (20 mEq total) by mouth 2 (two) times daily with a meal. 10/20/23   Cox, Kirsten, MD  ranolazine  (RANEXA ) 500 MG 12 hr tablet Take 1 tablet (500 mg total) by mouth 2 (two) times daily. 10/20/23   CoxAbigail, MD  Romosozumab -aqqg (EVENITY ) 105 MG/1.17ML SOSY injection Inject 210 mg into the skin once.    [provider]    Family History Family History  Problem Relation Age of Onset   Heart disease Mother    Diabetes Mother    Hyperlipidemia Mother    Hypertension Mother    Stroke Mother    Heart attack Mother    Heart disease Brother    Heart attack Brother    Stroke Brother    Hypertension Brother    Hyperlipidemia Brother     Social History Social History   Tobacco Use   Smoking status: Every Day    Current packs/day: 0.50    Average packs/day: 0.4 packs/day for 160.7 years (65.3 ttl pk-yrs)    Types: Cigarettes    Start date: 1965   Smokeless tobacco: Never   Tobacco comments:    Smokes 1/2 pack per day  Vaping Use   Vaping status: Never Used  Substance Use Topics   Alcohol use: No    Alcohol/week: 0.0 standard drinks of alcohol   Drug use: No     Allergies   Codeine, Macrobid  [nitrofurantoin ], Methocarbamol, Sertraline, and Tizanidine   Review of Systems Review of Systems  Constitutional:  Negative for chills and fever.  HENT:  Negative for ear pain and sore throat.   Eyes:  Negative for pain and visual disturbance.  Respiratory:  Positive for cough, shortness of breath and wheezing.   Cardiovascular:  Negative for chest pain and palpitations.  Gastrointestinal:  Positive for abdominal pain and diarrhea. Negative for constipation, nausea and vomiting.  Genitourinary:  Positive for frequency. Negative for dysuria and hematuria.  Musculoskeletal:  Positive for arthralgias. Negative for back pain.  Skin:  Negative for color change and rash.   Neurological:  Negative for seizures and syncope.  All other systems reviewed and are negative.    Physical Exam Triage Vital Signs ED Triage Vitals  Encounter Vitals Group     BP 12/07/23 1536 130/84     Girls  Systolic BP Percentile --      Girls Diastolic BP Percentile --      Boys Systolic BP Percentile --      Boys Diastolic BP Percentile --      Pulse Rate 12/07/23 1536 (!) 104     Resp 12/07/23 1536 20     Temp 12/07/23 1536 97.8 F (36.6 C)     Temp Source 12/07/23 1536 Oral     SpO2 12/07/23 1536 92 %     Weight --      Height --      Head Circumference --      Peak Flow --      Pain Score 12/07/23 1533 9     Pain Loc --      Pain Education --      Exclude from Growth Chart --    No data found.  Updated Vital Signs BP 130/84 (BP Location: Right Arm)   Pulse (!) 104   Temp 97.8 F (36.6 C) (Oral)   Resp 20   LMP  (LMP Unknown)   SpO2 92%   Visual Acuity Right Eye Distance:   Left Eye Distance:   Bilateral Distance:    Right Eye Near:   Left Eye Near:    Bilateral Near:     Physical Exam Vitals and nursing note reviewed.  Constitutional:      General: She is not in acute distress.    Appearance: She is well-developed. She is ill-appearing. She is not toxic-appearing or diaphoretic.  HENT:     Head: Normocephalic and atraumatic.     Right Ear: Tympanic membrane, ear canal and external ear normal. Decreased hearing noted.     Left Ear: Tympanic membrane, ear canal and external ear normal. Decreased hearing noted.     Nose: Congestion and rhinorrhea present. Rhinorrhea is clear.     Right Sinus: No maxillary sinus tenderness or frontal sinus tenderness.     Left Sinus: No maxillary sinus tenderness or frontal sinus tenderness.     Mouth/Throat:     Lips: Pink.     Mouth: Mucous membranes are moist.     Pharynx: Uvula midline. No oropharyngeal exudate or posterior oropharyngeal erythema.     Tonsils: No tonsillar exudate.  Eyes:      Conjunctiva/sclera: Conjunctivae normal.     Pupils: Pupils are equal, round, and reactive to light.  Cardiovascular:     Rate and Rhythm: Normal rate and regular rhythm.     Heart sounds: S1 normal and S2 normal. No murmur heard. Pulmonary:     Effort: Pulmonary effort is normal. No respiratory distress.     Breath sounds: Examination of the right-upper field reveals wheezing. Examination of the left-upper field reveals wheezing. Examination of the right-middle field reveals wheezing. Examination of the left-middle field reveals wheezing. Examination of the right-lower field reveals decreased breath sounds and wheezing. Examination of the left-lower field reveals decreased breath sounds and wheezing. Decreased breath sounds and wheezing present. No rhonchi or rales.     Comments: Reassessment after DuoNeb treatment: Breath sounds with rare wheezing and breath sounds audible throughout after DuoNeb treatment. Abdominal:     General: Bowel sounds are normal.     Palpations: Abdomen is soft.     Tenderness: There is abdominal tenderness in the suprapubic area. There is no right CVA tenderness, left CVA tenderness, guarding or rebound. Negative signs include Murphy's sign, Rovsing's sign and McBurney's sign.  Musculoskeletal:  General: No swelling.     Cervical back: Neck supple.  Lymphadenopathy:     Head:     Right side of head: No submental, submandibular, tonsillar, preauricular or posterior auricular adenopathy.     Left side of head: No submental, submandibular, tonsillar, preauricular or posterior auricular adenopathy.     Cervical: No cervical adenopathy.     Right cervical: No superficial cervical adenopathy.    Left cervical: No superficial cervical adenopathy.  Skin:    General: Skin is warm and dry.     Capillary Refill: Capillary refill takes less than 2 seconds.     Findings: No rash.  Neurological:     Mental Status: She is alert and oriented to person, place, and time.   Psychiatric:        Mood and Affect: Mood normal.      UC Treatments / Results  Labs (all labs ordered are listed, but only abnormal results are displayed) Labs Reviewed  POCT URINE DIPSTICK - Abnormal; Notable for the following components:      Result Value   Protein Ur, POC trace (*)    All other components within normal limits  URINE CULTURE   Comprehensive metabolic panel with GFR: 11/17/23:    Component Ref Range & Units (hover) 2 wk ago (11/17/23)  Glucose 101 High   BUN 13  Creatinine, Ser 1.10 High   eGFR 51 Low   BUN/Creatinine Ratio 12  Sodium 140  Potassium 3.5  Chloride 98  CO2 26  Calcium  9.4  Total Protein 6.7  Albumin 4.5  Globulin, Total 2.2  Bilirubin Total 0.5  Alkaline Phosphatase 108  AST 41 High   ALT 15  Resulting Agency [facility]      EKG   Radiology No results found.  Procedures Procedures (including critical care time)  Medications Ordered in UC Medications  ipratropium-albuterol  (DUONEB) 0.5-2.5 (3) MG/3ML nebulizer solution 3 mL (3 mLs Nebulization Given 12/07/23 1610)    Initial Impression / Assessment and Plan / UC Course  I have reviewed the triage vital signs and the nursing notes.  Pertinent labs & imaging results that were available during my care of the patient were reviewed by me and considered in my medical decision making (see chart for details).  Plan of Care: Acute viral bronchitis with cough versus early pneumonia: X-ray shows changes of chronic bronchitis.  Huge improvement in breath sounds after DuoNeb treatment.  Continue current inhalers.  Doxycycline  100 mg twice daily for 10 days.  Promethazine  DM, 5 mL, every 6 hours if needed for cough.  Prednisone  20 mg daily for 5 days.  Urinary frequency: Urinalysis is essentially normal but due to chronic UTIs and her symptoms, will send a culture and adjust the plan of care if needed.  Weakness and diarrhea.  Abdominal x-ray shows a moderate stool burden.  Needs to get  empty of stool and that might actually stop the diarrhea.  Encouraged MiraLAX, 1 capful twice daily for 2 to 3 days to get empty of stool.  Continue good hydration to prevent dehydration.  Follow-up if symptoms do not improve, worsen or new symptoms occur.  I reviewed the plan of care with the patient and/or the patient's guardian.  The patient and/or guardian had time to ask questions and acknowledged that the questions were answered.  I provided instruction on symptoms or reasons to return here or to go to an ER, if symptoms/condition did not improve, worsened or if new symptoms occurred.  Final  Clinical Impressions(s) / UC Diagnoses   Final diagnoses:  Acute cough  Urinary frequency  Diarrhea, unspecified type  Weakness  Acute bronchitis, unspecified organism     Discharge Instructions      Acute bronchitis with cough versus early pneumonia: X-ray shows changes of bronchitis but no sign of consolidation or pneumonia.  Get plenty of fluids and rest.  Based on the exam and breath sounds, I believe she may have early bronchial pneumonia.  Will treat with doxycycline  100 mg twice daily for 10 days.  Get plenty of fluids and rest.  Promethazine  DM, 5 mL, every 6 hours as needed for cough.  Provided DuoNeb solution for hand-held nebulizer machine to be used every 4-6 hours if needed for cough or wheezing.  Weakness, diarrhea and urinary frequency: Patient has a moderate burden of stool.  It would be beneficial for her to get empty of stool.  She could use MiraLAX 1 capful twice daily for 3 to 4 days to get empty of stool.  I know she is having 3 stools that are loose a day but she still full of poop.  Get plenty of liquids.  Urinary frequency: Urinalysis looks essentially normal.  Urine culture sent due to symptoms and history of chronic UTI.  Will adjust the plan of care, if needed once the culture results.  Follow-up here as needed.  Follow-up if symptoms do not improve, worsen or new  symptoms occur.  Follow-up in 2 to 3 weeks with primary care for lung assessment or sooner as needed.       ED Prescriptions     Medication Sig Dispense Auth. Provider   ipratropium-albuterol  (DUONEB) 0.5-2.5 (3) MG/3ML SOLN Take 3 mLs by nebulization every 6 (six) hours as needed. 360 mL Ival Domino, FNP   doxycycline  (VIBRAMYCIN ) 100 MG capsule Take 1 capsule (100 mg total) by mouth 2 (two) times daily for 10 days. 20 capsule Ival Domino, FNP   promethazine -dextromethorphan (PROMETHAZINE -DM) 6.25-15 MG/5ML syrup Take 5 mLs by mouth 4 (four) times daily as needed for cough. Do not use and drive - May make drowsy. 118 mL Ival Domino, FNP   predniSONE  (DELTASONE ) 20 MG tablet Take 1 tablet (20 mg total) by mouth daily with breakfast for 5 days. 5 tablet Caliber Landess, FNP      PDMP not reviewed this encounter.   Ival Domino, FNP 12/07/23 [phone]

## 2023-12-09 LAB — URINE CULTURE: Culture: NO GROWTH

## 2023-12-10 NOTE — Progress Notes (Signed)
 Urine culture is negative.  Patient was treated for other conditions and should complete her medications for those conditions.  No antibiotic needed for the urine culture.  Unable to reach the patient but I was able to leave her daughter, Rosaline Ellen, a voicemail message with this update.

## 2023-12-13 ENCOUNTER — Emergency Department (HOSPITAL_BASED_OUTPATIENT_CLINIC_OR_DEPARTMENT_OTHER)

## 2023-12-13 ENCOUNTER — Other Ambulatory Visit: Payer: Self-pay

## 2023-12-13 ENCOUNTER — Inpatient Hospital Stay (HOSPITAL_BASED_OUTPATIENT_CLINIC_OR_DEPARTMENT_OTHER)
Admission: EM | Admit: 2023-12-13 | Discharge: 2023-12-16 | DRG: 391 | Disposition: A | Discharge status: Home / Self Care | Attending: Internal Medicine | Admitting: Internal Medicine

## 2023-12-13 DIAGNOSIS — J9611 Chronic respiratory failure with hypoxia: Secondary | ICD-10-CM | POA: Diagnosis present

## 2023-12-13 DIAGNOSIS — I252 Old myocardial infarction: Secondary | ICD-10-CM | POA: Diagnosis not present

## 2023-12-13 DIAGNOSIS — M353 Polymyalgia rheumatica: Secondary | ICD-10-CM | POA: Diagnosis present

## 2023-12-13 DIAGNOSIS — Z83438 Family history of other disorder of lipoprotein metabolism and other lipidemia: Secondary | ICD-10-CM

## 2023-12-13 DIAGNOSIS — J189 Pneumonia, unspecified organism: Secondary | ICD-10-CM | POA: Diagnosis not present

## 2023-12-13 DIAGNOSIS — Z87891 Personal history of nicotine dependence: Secondary | ICD-10-CM

## 2023-12-13 DIAGNOSIS — I110 Hypertensive heart disease with heart failure: Secondary | ICD-10-CM | POA: Diagnosis present

## 2023-12-13 DIAGNOSIS — G894 Chronic pain syndrome: Secondary | ICD-10-CM | POA: Diagnosis present

## 2023-12-13 DIAGNOSIS — I679 Cerebrovascular disease, unspecified: Secondary | ICD-10-CM

## 2023-12-13 DIAGNOSIS — F32A Depression, unspecified: Secondary | ICD-10-CM | POA: Diagnosis present

## 2023-12-13 DIAGNOSIS — E119 Type 2 diabetes mellitus without complications: Secondary | ICD-10-CM | POA: Diagnosis present

## 2023-12-13 DIAGNOSIS — Z87898 Personal history of other specified conditions: Secondary | ICD-10-CM

## 2023-12-13 DIAGNOSIS — E860 Dehydration: Secondary | ICD-10-CM | POA: Diagnosis present

## 2023-12-13 DIAGNOSIS — F1120 Opioid dependence, uncomplicated: Secondary | ICD-10-CM | POA: Diagnosis present

## 2023-12-13 DIAGNOSIS — R0602 Shortness of breath: Secondary | ICD-10-CM | POA: Diagnosis not present

## 2023-12-13 DIAGNOSIS — R112 Nausea with vomiting, unspecified: Secondary | ICD-10-CM | POA: Diagnosis not present

## 2023-12-13 DIAGNOSIS — E876 Hypokalemia: Secondary | ICD-10-CM | POA: Diagnosis not present

## 2023-12-13 DIAGNOSIS — Z888 Allergy status to other drugs, medicaments and biological substances status: Secondary | ICD-10-CM

## 2023-12-13 DIAGNOSIS — J449 Chronic obstructive pulmonary disease, unspecified: Secondary | ICD-10-CM | POA: Diagnosis not present

## 2023-12-13 DIAGNOSIS — E875 Hyperkalemia: Secondary | ICD-10-CM | POA: Diagnosis not present

## 2023-12-13 DIAGNOSIS — Z9981 Dependence on supplemental oxygen: Secondary | ICD-10-CM

## 2023-12-13 DIAGNOSIS — Z8673 Personal history of transient ischemic attack (TIA), and cerebral infarction without residual deficits: Secondary | ICD-10-CM

## 2023-12-13 DIAGNOSIS — E785 Hyperlipidemia, unspecified: Secondary | ICD-10-CM | POA: Diagnosis present

## 2023-12-13 DIAGNOSIS — I10 Essential (primary) hypertension: Secondary | ICD-10-CM | POA: Insufficient documentation

## 2023-12-13 DIAGNOSIS — J439 Emphysema, unspecified: Secondary | ICD-10-CM | POA: Diagnosis present

## 2023-12-13 DIAGNOSIS — Z833 Family history of diabetes mellitus: Secondary | ICD-10-CM

## 2023-12-13 DIAGNOSIS — K529 Noninfective gastroenteritis and colitis, unspecified: Secondary | ICD-10-CM | POA: Diagnosis not present

## 2023-12-13 DIAGNOSIS — Z955 Presence of coronary angioplasty implant and graft: Secondary | ICD-10-CM

## 2023-12-13 DIAGNOSIS — Z885 Allergy status to narcotic agent status: Secondary | ICD-10-CM

## 2023-12-13 DIAGNOSIS — K219 Gastro-esophageal reflux disease without esophagitis: Secondary | ICD-10-CM | POA: Diagnosis present

## 2023-12-13 DIAGNOSIS — R197 Diarrhea, unspecified: Principal | ICD-10-CM

## 2023-12-13 DIAGNOSIS — Z7902 Long term (current) use of antithrombotics/antiplatelets: Secondary | ICD-10-CM

## 2023-12-13 DIAGNOSIS — E048 Other specified nontoxic goiter: Secondary | ICD-10-CM | POA: Diagnosis present

## 2023-12-13 DIAGNOSIS — K551 Chronic vascular disorders of intestine: Secondary | ICD-10-CM

## 2023-12-13 DIAGNOSIS — Z8249 Family history of ischemic heart disease and other diseases of the circulatory system: Secondary | ICD-10-CM

## 2023-12-13 DIAGNOSIS — Z86711 Personal history of pulmonary embolism: Secondary | ICD-10-CM

## 2023-12-13 DIAGNOSIS — Z7951 Long term (current) use of inhaled steroids: Secondary | ICD-10-CM

## 2023-12-13 DIAGNOSIS — R109 Unspecified abdominal pain: Secondary | ICD-10-CM | POA: Diagnosis not present

## 2023-12-13 DIAGNOSIS — I5022 Chronic systolic (congestive) heart failure: Secondary | ICD-10-CM | POA: Diagnosis present

## 2023-12-13 DIAGNOSIS — Z8679 Personal history of other diseases of the circulatory system: Secondary | ICD-10-CM

## 2023-12-13 DIAGNOSIS — Z823 Family history of stroke: Secondary | ICD-10-CM

## 2023-12-13 DIAGNOSIS — Z90710 Acquired absence of both cervix and uterus: Secondary | ICD-10-CM

## 2023-12-13 DIAGNOSIS — K55059 Acute (reversible) ischemia of intestine, part and extent unspecified: Secondary | ICD-10-CM | POA: Diagnosis present

## 2023-12-13 DIAGNOSIS — R531 Weakness: Secondary | ICD-10-CM | POA: Diagnosis not present

## 2023-12-13 DIAGNOSIS — D751 Secondary polycythemia: Secondary | ICD-10-CM | POA: Diagnosis present

## 2023-12-13 DIAGNOSIS — Z79899 Other long term (current) drug therapy: Secondary | ICD-10-CM

## 2023-12-13 DIAGNOSIS — Z85820 Personal history of malignant melanoma of skin: Secondary | ICD-10-CM

## 2023-12-13 DIAGNOSIS — Z881 Allergy status to other antibiotic agents status: Secondary | ICD-10-CM

## 2023-12-13 DIAGNOSIS — G40909 Epilepsy, unspecified, not intractable, without status epilepticus: Secondary | ICD-10-CM | POA: Diagnosis present

## 2023-12-13 DIAGNOSIS — A084 Viral intestinal infection, unspecified: Secondary | ICD-10-CM | POA: Diagnosis present

## 2023-12-13 LAB — COMPREHENSIVE METABOLIC PANEL WITH GFR
ALT: 16 U/L (ref 0–44)
AST: 30 U/L (ref 15–41)
Albumin: 4.4 g/dL (ref 3.5–5.0)
Alkaline Phosphatase: 136 U/L — ABNORMAL HIGH (ref 38–126)
Anion gap: 17 — ABNORMAL HIGH (ref 5–15)
BUN: 15 mg/dL (ref 8–23)
CO2: 23 mmol/L (ref 22–32)
Calcium: 9 mg/dL (ref 8.9–10.3)
Chloride: 104 mmol/L (ref 98–111)
Creatinine, Ser: 0.83 mg/dL (ref 0.44–1.00)
GFR, Estimated: >60 mL/min (ref >60)
Glucose, Bld: 142 mg/dL — ABNORMAL HIGH (ref 70–99)
Potassium: 2.7 mmol/L — CL (ref 3.5–5.1)
Sodium: 143 mmol/L (ref 135–145)
Total Bilirubin: 0.9 mg/dL (ref 0.0–1.2)
Total Protein: 6.6 g/dL (ref 6.5–8.1)

## 2023-12-13 LAB — CBC
HCT: 46.3 % — ABNORMAL HIGH (ref 36.0–46.0)
Hemoglobin: 16.9 g/dL — ABNORMAL HIGH (ref 12.0–15.0)
MCH: 32.6 pg (ref 26.0–34.0)
MCHC: 36.5 g/dL — ABNORMAL HIGH (ref 30.0–36.0)
MCV: 89.4 fL (ref 80.0–100.0)
Platelets: 193 K/uL (ref 150–400)
RBC: 5.18 MIL/uL — ABNORMAL HIGH (ref 3.87–5.11)
RDW: 12.8 % (ref 11.5–15.5)
WBC: 7 K/uL (ref 4.0–10.5)
nRBC: 0 % (ref 0.0–0.2)

## 2023-12-13 LAB — URINALYSIS, W/ REFLEX TO CULTURE (INFECTION SUSPECTED)
Bilirubin Urine: NEGATIVE
Glucose, UA: NEGATIVE mg/dL
Hgb urine dipstick: NEGATIVE
Ketones, ur: 15 mg/dL — AB
Leukocytes,Ua: NEGATIVE
Nitrite: NEGATIVE
Protein, ur: NEGATIVE mg/dL
RBC / HPF: NONE SEEN RBC/hpf (ref 0–5)
Specific Gravity, Urine: 1.015 (ref 1.005–1.030)
pH: 8.5 — ABNORMAL HIGH (ref 5.0–8.0)

## 2023-12-13 LAB — TROPONIN T, HIGH SENSITIVITY
Troponin T High Sensitivity: 19 ng/L (ref 0–19)
Troponin T High Sensitivity: 20 ng/L — ABNORMAL HIGH (ref 0–19)

## 2023-12-13 LAB — LACTIC ACID, PLASMA: Lactic Acid, Venous: 1.1 mmol/L (ref 0.5–1.9)

## 2023-12-13 LAB — LIPASE, BLOOD: Lipase: 44 U/L (ref 11–51)

## 2023-12-13 LAB — MAGNESIUM: Magnesium: 2 mg/dL (ref 1.7–2.4)

## 2023-12-13 MED ORDER — POTASSIUM CHLORIDE 10 MEQ/100ML IV SOLN
10.0000 meq | INTRAVENOUS | Status: AC
Start: 2023-12-13 — End: 2023-12-13
  Administered 2023-12-13 (×2): 10 meq via INTRAVENOUS
  Filled 2023-12-13 (×2): qty 100

## 2023-12-13 MED ORDER — ONDANSETRON HCL 4 MG/2ML IJ SOLN
4.0000 mg | Freq: Once | INTRAMUSCULAR | Status: AC
  Administered 2023-12-13: 4 mg via INTRAVENOUS
  Filled 2023-12-13: qty 2

## 2023-12-13 MED ORDER — HYDROMORPHONE HCL 1 MG/ML IJ SOLN
0.5000 mg | Freq: Once | INTRAMUSCULAR | Status: AC
  Administered 2023-12-13: 0.5 mg via INTRAVENOUS
  Filled 2023-12-13: qty 1

## 2023-12-13 MED ORDER — ONDANSETRON 4 MG PO TBDP
4.0000 mg | ORAL_TABLET | Freq: Once | ORAL | Status: AC
  Administered 2023-12-13: 4 mg via ORAL
  Filled 2023-12-13: qty 1

## 2023-12-13 MED ORDER — PROCHLORPERAZINE EDISYLATE 10 MG/2ML IJ SOLN
10.0000 mg | Freq: Once | INTRAMUSCULAR | Status: AC
  Administered 2023-12-13: 10 mg via INTRAVENOUS
  Filled 2023-12-13: qty 2

## 2023-12-13 MED ORDER — SODIUM CHLORIDE 0.9 % IV BOLUS
500.0000 mL | Freq: Once | INTRAVENOUS | Status: AC
  Administered 2023-12-13: 500 mL via INTRAVENOUS

## 2023-12-13 MED ORDER — HYDROMORPHONE HCL 1 MG/ML IJ SOLN
1.0000 mg | Freq: Once | INTRAMUSCULAR | Status: AC
  Administered 2023-12-13: 1 mg via INTRAVENOUS
  Filled 2023-12-13: qty 1

## 2023-12-13 MED ORDER — IOHEXOL 300 MG/ML  SOLN
100.0000 mL | Freq: Once | INTRAMUSCULAR | Status: AC | PRN
  Administered 2023-12-13: 100 mL via INTRAVENOUS

## 2023-12-13 MED ORDER — ONDANSETRON HCL 4 MG/2ML IJ SOLN: 4.0000 mg | Freq: Once | INTRAMUSCULAR | Status: DC

## 2023-12-13 NOTE — ED Notes (Signed)
 Patient presents with SOB. Stated she has been SOB and sick on her stomach today. Wears 2L all the time at home. SAT 100%, BBS clear, slightly diminished in bases. Patient does appear SOB but VS WNL. Placed patient on 2L on one of our O2 tanks, and gave her tank to her son.

## 2023-12-13 NOTE — ED Notes (Signed)
 Date and time results received: 12/13/23, 2015 (use smartphrase .now to insert current time)  Test: CMP  Critical Value: K 2.7  Name of Provider Notified: Lenor MD

## 2023-12-13 NOTE — ED Triage Notes (Signed)
 Pt reports increased shob x 1 week, subjective fever. Reports emesis today. Diarrhea x 3 weeks, abd pain x 1 week.    RT to triage to assess. Denies known sick contacts.

## 2023-12-13 NOTE — Progress Notes (Signed)
 Hospitalist Transfer Note:    Nursing staff, Please call [facility] Admits & Consults System-Wide number on Amion [phone]) as soon as patient's arrival, so appropriate admitting provider can evaluate the pt.   Transferring facility: [facility] Requesting provider: Dr. Roselyn (EDP at [facility]) Reason for transfer: admission for further evaluation and management of hypokalemia, dehydration.   78 year old female who presented to [facility] ED complaining of 2 weeks of loose stool in the absence of any associated melena or hematochezia.  She also notes more recent development of intermittent nausea over the last 1 to 2 days, which has been associated with multiple episodes of nonbloody, nonbilious emesis and diminished oral intake over the last 48 hours.   Labs were notable for potassium level of 2.7.  Imaging notable for CT abdomen/pelvis with contrast, which was reported to show no evidence of bowel obstruction, perforation, or abscess, but showed findings that were suggestive of nonspecific enterocolitis.  Subsequently, I accepted this patient for transfer for observation to a med/tele bed at [facility] or [facility] (first available) for further work-up and management of the above.     Eva Pore, DO Hospitalist

## 2023-12-13 NOTE — ED Notes (Signed)
 Patient transported to CT

## 2023-12-13 NOTE — ED Provider Notes (Signed)
 [facility] Provider Note   CSN: [id number] Arrival date & time: 12/13/23  1825     Patient presents with: Shortness of Breath   Donna Little is a 78 y.o. female.  {Add pertinent medical, surgical, social history, OB history to HPI:32947} Patient is a 78 year old female with a history of COPD on oxygen  at 2 L/min, hypertension, hyperlipidemia, SVT who presents with fatigue and abdominal pain.  She complains of 2-week history of diarrhea.  It is mostly watery, nonbloody diarrhea.  She has had some associated abdominal pain.  Today she started having some nausea and vomiting.  Her emesis is nonbloody and nonbilious.  She denies any fevers.  She has had a little bit of a cough but not really respiratory symptoms.  She has had some shortness of breath but she feels like it is more fatigue.  No urinary symptoms.  She went to an urgent care about 6 days ago and was treated with antibiotics which she thinks is doxycycline  and prednisone  for possible bronchitis.  Other than that, she has not been on antibiotics.  No recent travel.       Prior to Admission medications   Medication Sig Start Date End Date Taking? Authorizing Provider  acetaminophen  (TYLENOL ) 325 MG tablet Take by mouth. 05/09/20   [provider]  albuterol  (VENTOLIN  HFA) 108 (90 Base) MCG/ACT inhaler Inhale 2 puffs into the lungs every 6 (six) hours as needed. 06/03/22   Cox, Abigail, MD  amLODipine  (NORVASC ) 2.5 MG tablet Take 1 tablet (2.5 mg total) by mouth daily. 11/24/23   CoxAbigail, MD  atorvastatin  (LIPITOR) 80 MG tablet Take 1 tablet (80 mg total) by mouth daily. 06/21/23   CoxAbigail, MD  citalopram  (CELEXA ) 10 MG tablet Take 1 tablet (10 mg total) by mouth daily. 11/24/23   Sherre Abigail, MD  clopidogrel  (PLAVIX ) 75 MG tablet Take 1 tablet by mouth once daily 11/11/23   Cox, Kirsten, MD  dicyclomine  (BENTYL ) 20 MG tablet Take 1 tablet (20 mg total) by mouth 4 (four)  times daily -  before meals and at bedtime. 09/01/23   CoxAbigail, MD  diltiazem  (CARDIZEM  CD) 120 MG 24 hr capsule Take 1 capsule (120 mg total) by mouth daily. 12/01/23   CoxAbigail, MD  doxycycline  (VIBRAMYCIN ) 100 MG capsule Take 1 capsule (100 mg total) by mouth 2 (two) times daily for 10 days. 12/07/23 12/17/23  Ival Domino, FNP  Fluticasone-Umeclidin-Vilant (TRELEGY ELLIPTA ) 100-62.5-25 MCG/ACT AEPB Inhale into the lungs.    [provider]  furosemide  (LASIX ) 40 MG tablet Take 2 tablets by mouth twice daily 11/07/23   Cox, Kirsten, MD  gabapentin  (NEURONTIN ) 800 MG tablet Take 1 tablet (800 mg total) by mouth 3 (three) times daily. 09/15/23   Cox, Abigail, MD  ipratropium-albuterol  (DUONEB) 0.5-2.5 (3) MG/3ML SOLN Take 3 mLs by nebulization every 6 (six) hours as needed. 12/07/23   Ival Domino, FNP  levETIRAcetam  (KEPPRA ) 500 MG tablet Take 1 tablet (500 mg total) by mouth 2 (two) times daily. 10/14/23   CoxAbigail, MD  meclizine  (ANTIVERT ) 25 MG tablet Take 1 tablet (25 mg total) by mouth every 8 (eight) hours as needed for dizziness. 07/29/22   CoxAbigail, MD  morphine  (MS CONTIN ) 15 MG 12 hr tablet Take 1 tablet (15 mg total) by mouth every 12 (twelve) hours. 11/17/23   CoxAbigail, MD  naloxone  (NARCAN ) nasal spray 4 mg/0.1 mL 1 spray (4 mg) intranasally  into 1 nostril. Use a new Narcan (R) nasal spray for subsequent doses and administer into alternating nostrils. May repeat every 2 to 3 minutes 11/17/23   Cox, Kirsten, MD  nitroGLYCERIN (NITROSTAT) 0.4 MG SL tablet Place under the tongue. Patient taking differently: Place 0.4 mg under the tongue every 5 (five) minutes as needed for chest pain.    [provider]  pantoprazole  (PROTONIX ) 40 MG tablet Take 1 tablet (40 mg total) by mouth daily. 10/20/23   CoxAbigail, MD  potassium chloride  SA (KLOR-CON  M) 20 MEQ tablet Take 1 tablet (20 mEq total) by mouth 2 (two) times daily with a meal. 10/20/23   Cox, Kirsten, MD   promethazine -dextromethorphan (PROMETHAZINE -DM) 6.25-15 MG/5ML syrup Take 5 mLs by mouth 4 (four) times daily as needed for cough. Do not use and drive - May make drowsy. 12/07/23   Ival Domino, FNP  ranolazine  (RANEXA ) 500 MG 12 hr tablet Take 1 tablet (500 mg total) by mouth 2 (two) times daily. 10/20/23   CoxAbigail, MD  Romosozumab -aqqg (EVENITY ) 105 MG/1.17ML SOSY injection Inject 210 mg into the skin once.    [provider]    Allergies: Codeine, Macrobid  [nitrofurantoin ], Methocarbamol, Sertraline, and Tizanidine    Review of Systems  Constitutional:  Positive for fatigue. Negative for chills, diaphoresis and fever.  HENT:  Negative for congestion, rhinorrhea and sneezing.   Eyes: Negative.   Respiratory:  Positive for cough and shortness of breath. Negative for chest tightness.   Cardiovascular:  Negative for chest pain and leg swelling.  Gastrointestinal:  Positive for abdominal pain, diarrhea, nausea and vomiting. Negative for blood in stool.  Genitourinary:  Negative for difficulty urinating, flank pain, frequency and hematuria.  Musculoskeletal:  Negative for arthralgias and back pain.  Skin:  Negative for rash.  Neurological:  Positive for weakness (Generalized). Negative for dizziness, speech difficulty, numbness and headaches.    Updated Vital Signs BP 122/73   Pulse 81   Temp 97.6 F (36.4 C) (Oral)   Resp 13   Ht 5' 1.5 (1.562 m)   Wt 55.8 kg   LMP  (LMP Unknown)   SpO2 100%   BMI 22.86 kg/m   Physical Exam Constitutional:      Appearance: She is well-developed.  HENT:     Head: Normocephalic and atraumatic.  Eyes:     Pupils: Pupils are equal, round, and reactive to light.  Cardiovascular:     Rate and Rhythm: Normal rate and regular rhythm.     Heart sounds: Normal heart sounds.  Pulmonary:     Effort: Pulmonary effort is normal. Tachypnea present. No respiratory distress.     Breath sounds: Normal breath sounds. No wheezing or rales.   Chest:     Chest wall: No tenderness.  Abdominal:     General: Bowel sounds are normal.     Palpations: Abdomen is soft.     Tenderness: There is no abdominal tenderness. There is no guarding or rebound.  Musculoskeletal:        General: Normal range of motion.     Cervical back: Normal range of motion and neck supple.  Lymphadenopathy:     Cervical: No cervical adenopathy.  Skin:    General: Skin is warm and dry.     Findings: No rash.  Neurological:     Mental Status: She is alert and oriented to person, place, and time.     (all labs ordered are listed, but only abnormal results are displayed) Labs  Reviewed  CBC - Abnormal; Notable for the following components:      Result Value   RBC 5.18 (*)    Hemoglobin 16.9 (*)    HCT 46.3 (*)    MCHC 36.5 (*)    All other components within normal limits  COMPREHENSIVE METABOLIC PANEL WITH GFR - Abnormal; Notable for the following components:   Potassium 2.7 (*)    Glucose, Bld 142 (*)    Alkaline Phosphatase 136 (*)    Anion gap 17 (*)    All other components within normal limits  TROPONIN T, HIGH SENSITIVITY - Abnormal; Notable for the following components:   Troponin T High Sensitivity 20 (*)    All other components within normal limits  C DIFFICILE QUICK SCREEN W PCR REFLEX    GASTROINTESTINAL PANEL BY PCR, STOOL (REPLACES STOOL CULTURE)  LIPASE, BLOOD  MAGNESIUM  LACTIC ACID, PLASMA  URINALYSIS, W/ REFLEX TO CULTURE (INFECTION SUSPECTED)  LACTIC ACID, PLASMA  TROPONIN T, HIGH SENSITIVITY    EKG: EKG Interpretation Date/Time:  Monday December 13 2023 18:38:26 EDT Ventricular Rate:  87 PR Interval:  158 QRS Duration:  119 QT Interval:  408 QTC Calculation: 494 R Axis:   63  Text Interpretation: Sinus rhythm Nonspecific intraventricular conduction delay Borderline repolarization abnormality No old tracing to compare Confirmed by Lenor Hollering [phone]) on 12/13/2023 7:40:16 PM  Radiology: CT ABDOMEN PELVIS W  CONTRAST Result Date: 12/13/2023 EXAM: CT ABDOMEN AND PELVIS WITH CONTRAST 12/13/2023 09:47:58 PM TECHNIQUE: CT of the abdomen and pelvis was performed with the administration of intravenous contrast (100mL iohexol  (OMNIPAQUE ) 300 MG/ML solution). Multiplanar reformatted images are provided for review. Automated exposure control, iterative reconstruction, and/or weight-based adjustment of the mA/kV was utilized to reduce the radiation dose to as low as reasonably achievable. COMPARISON: CT 06/20/2023 CLINICAL HISTORY: Abdominal pain, acute, nonlocalized. 78 y/o female reports increased shob x 1 week, subjective fever. Reports emesis today. Diarrhea x 3 weeks, abd pain x 1 week. Surgeries: Total hysterectomy; Appendectomy, multiple spine surgeries. FINDINGS: LOWER CHEST: No acute abnormality. LIVER: The liver is unremarkable. GALLBLADDER AND BILE DUCTS: Gallbladder is unremarkable. No biliary ductal dilatation. SPLEEN: No acute abnormality. PANCREAS: No acute abnormality. ADRENAL GLANDS: No acute abnormality. KIDNEYS, URETERS AND BLADDER: No stones in the kidneys or ureters. No hydronephrosis. No perinephric or periureteral stranding. Urinary bladder is unremarkable. GI AND BOWEL: Fluid-filled small bowel with possible mild thickening of the wall and valvulae conniventes. Mild wall thickening and mucosal hyperenhancement of the colon versus underdistention. There is no bowel obstruction. PERITONEUM AND RETROPERITONEUM: No ascites. No free air. VASCULATURE: Advanced arterial atherosclerotic calcification including the aorta and its mesenteric and iliac artery branches. Severe narrowing of the SMA at the origin with possible occlusion (series 10 image 99). Correlation for signs and symptoms of chronic mesenteric ischemia is recommended. LYMPH NODES: No lymphadenopathy. REPRODUCTIVE ORGANS: No acute abnormality. BONES AND SOFT TISSUES: Posterior fusion in the thoracolumbar spine. Multiple chronic vertebral body  compression fractures. Vertebroplasty of L5. No acute fracture. No focal soft tissue abnormality. IMPRESSION: 1. Findings are suggestive of nonspecific enterocolitis. 2. 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 Recommend correlation for signs and symptoms of chronic mesenteric ischemia and correlation with lactate. Electronically signed by: Norman Gatlin MD 12/13/2023 10:13 PM EDT RP Workstation: [id number]   DG Chest 2 View Result Date: 12/13/2023 CLINICAL DATA:  Short of breath EXAM: CHEST - 2 VIEW COMPARISON:  12/13/2023 FINDINGS: Frontal and  lateral views of the chest demonstrate a stable cardiac silhouette. No acute airspace disease, effusion, or pneumothorax. Stable postsurgical changes of the thoracolumbar spine. IMPRESSION: 1. Stable chest, no acute process. Electronically Signed   By: Ozell Daring M.D.   On: 12/13/2023 19:44    {Document cardiac monitor, telemetry assessment procedure when appropriate:32947} Procedures   Medications Ordered in the ED  potassium chloride  10 mEq in 100 mL IVPB (10 mEq Intravenous New Bag/Given 12/13/23 2239)  ondansetron  (ZOFRAN -ODT) disintegrating tablet 4 mg (4 mg Oral Given 12/13/23 1900)  sodium chloride  0.9 % bolus 500 mL (500 mLs Intravenous New Bag/Given 12/13/23 1946)  HYDROmorphone  (DILAUDID ) injection 1 mg (1 mg Intravenous Given 12/13/23 1959)  ondansetron  (ZOFRAN ) injection 4 mg (4 mg Intravenous Given 12/13/23 2030)  iohexol  (OMNIPAQUE ) 300 MG/ML solution 100 mL (100 mLs Intravenous Contrast Given 12/13/23 2129)      {Click here for ABCD2, HEART and other calculators REFRESH Note before signing:1}                              Medical Decision Making Amount and/or Complexity of Data Reviewed Labs: ordered. Radiology: ordered.  Risk Prescription drug management. Decision regarding hospitalization.   This patient presents to the ED for concern of diarrhea, abdominal pain, this  involves an extensive number of treatment options, and is a complaint that carries with it a high risk of complications and morbidity.  I considered the following differential and admission for this acute, potentially life threatening condition.  The differential diagnosis includes colitis, diverticulitis, bowel obstruction, C. difficile, ischemic colitis, cholecystitis, appendicitis  MDM:    Patient is a 78 year old who presents with diarrhea associated with abdominal pain and vomiting.  She also has fatigue and shortness of breath.  She was given IV fluids and antiemetics.  She is overall feeling better and her shortness of breath has resolved.  Her lungs are clear without wheezing.  No hypoxia on her baseline oxygen .  Labs are overall nonconcerning.  Other than her potassium is low at 2.7.  Was given IV potassium replacement.  Her second troponin was minimally elevated but not significantly different than the first 1 which was normal.  CT scan of her abdomen shows likely enterocolitis.  There is some narrowing of the mesenteric artery.  Lactic acid was added and is normal.  Although patient does not have any ongoing severe abdominal pain that would be more concerning for ischemic colitis.  No bloody stools.  C. difficile and stool studies are pending.  Will plan admission for further treatment.  She does have chronic pain and is on chronic morphine .  Was given some IV Dilaudid  here in the ED for her chronic pain.  (Labs, imaging, consults)  Labs: I Ordered, and personally interpreted labs.  The pertinent results include: Hypokalemia  Imaging Studies ordered: I ordered imaging studies including CT abdomen pelvis I independently visualized and interpreted imaging. I agree with the radiologist interpretation  Additional history obtained from son.  External records from outside source obtained and reviewed including history  Cardiac Monitoring: The patient was maintained on a cardiac monitor.  If  on the cardiac monitor, I personally viewed and interpreted the cardiac monitored which showed an underlying rhythm of: Sinus rhythm  Reevaluation: After the interventions noted above, I reevaluated the patient and found that they have :improved  Social Determinants of Health:    Disposition: Admit to hospital.  Co morbidities that complicate the  patient evaluation  Past Medical History:  Diagnosis Date   A-fib [facility])    Atherosclerosis of both carotid arteries 2022   noted per CT on 05/14/20   Chronic pain syndrome    Chronic systolic heart failure (HCC)    Depression    Emphysema of lung (HCC) 2022   noted per CT scan on 05/14/20   Generalized atherosclerosis    GERD (gastroesophageal reflux disease)    Hypertension    IBS (irritable bowel syndrome)    Melanoma (HCC)    Other specified nontoxic goiter    Palpitations    PMR (polymyalgia rheumatica) (HCC) 07/04/2020   Polymyalgia rheumatica (HCC)    Primary pulmonary hypertension (HCC)    Pulmonary embolism (HCC)    Stroke (HCC)    ministroke times 3 Right side weakness   Supraventricular tachycardia (HCC)    Type 2 diabetes mellitus (HCC)    diet controlled     Medicines Meds ordered this encounter  Medications   ondansetron  (ZOFRAN -ODT) disintegrating tablet 4 mg   sodium chloride  0.9 % bolus 500 mL   DISCONTD: ondansetron  (ZOFRAN ) injection 4 mg   HYDROmorphone  (DILAUDID ) injection 1 mg   ondansetron  (ZOFRAN ) injection 4 mg   potassium chloride  10 mEq in 100 mL IVPB   iohexol  (OMNIPAQUE ) 300 MG/ML solution 100 mL    I have reviewed the patients home medicines and have made adjustments as needed  Problem List / ED Course: Problem List Items Addressed This Visit   None Visit Diagnoses       Diarrhea, unspecified type    -  Primary     Hypokalemia         Nausea and vomiting, unspecified vomiting type                 {Document critical care time when appropriate  Document review of labs and  clinical decision tools ie CHADS2VASC2, etc  Document your independent review of radiology images and any outside records  Document your discussion with family members, caretakers and with consultants  Document social determinants of health affecting pt's care  Document your decision making why or why not admission, treatments were needed:32947:::1}   Final diagnoses:  Diarrhea, unspecified type  Hypokalemia  Nausea and vomiting, unspecified vomiting type    ED Discharge Orders     None

## 2023-12-13 NOTE — ED Notes (Signed)
 Pt called out when obtained pain medication order and noted she is now feeling sick on her stomach as well.

## 2023-12-14 ENCOUNTER — Ambulatory Visit: Admitting: Family Medicine

## 2023-12-14 DIAGNOSIS — Z87898 Personal history of other specified conditions: Secondary | ICD-10-CM

## 2023-12-14 DIAGNOSIS — K529 Noninfective gastroenteritis and colitis, unspecified: Secondary | ICD-10-CM

## 2023-12-14 DIAGNOSIS — D751 Secondary polycythemia: Secondary | ICD-10-CM | POA: Insufficient documentation

## 2023-12-14 DIAGNOSIS — J9611 Chronic respiratory failure with hypoxia: Secondary | ICD-10-CM

## 2023-12-14 DIAGNOSIS — Z8673 Personal history of transient ischemic attack (TIA), and cerebral infarction without residual deficits: Secondary | ICD-10-CM

## 2023-12-14 DIAGNOSIS — Z8679 Personal history of other diseases of the circulatory system: Secondary | ICD-10-CM

## 2023-12-14 DIAGNOSIS — E876 Hypokalemia: Secondary | ICD-10-CM

## 2023-12-14 DIAGNOSIS — I10 Essential (primary) hypertension: Secondary | ICD-10-CM | POA: Insufficient documentation

## 2023-12-14 LAB — BASIC METABOLIC PANEL WITH GFR
Anion gap: 12 (ref 5–15)
BUN: 11 mg/dL (ref 8–23)
CO2: 23 mmol/L (ref 22–32)
Calcium: 8.6 mg/dL — ABNORMAL LOW (ref 8.9–10.3)
Chloride: 108 mmol/L (ref 98–111)
Creatinine, Ser: 0.76 mg/dL (ref 0.44–1.00)
GFR, Estimated: >60 mL/min (ref >60)
Glucose, Bld: 91 mg/dL (ref 70–99)
Potassium: 3 mmol/L — ABNORMAL LOW (ref 3.5–5.1)
Sodium: 143 mmol/L (ref 135–145)

## 2023-12-14 LAB — LACTIC ACID, PLASMA: Lactic Acid, Venous: 0.8 mmol/L (ref 0.5–1.9)

## 2023-12-14 LAB — C DIFFICILE QUICK SCREEN W PCR REFLEX
C Diff antigen: POSITIVE — AB
C Diff toxin: NEGATIVE

## 2023-12-14 LAB — CLOSTRIDIUM DIFFICILE BY PCR, REFLEXED
Hypervirulent Strain: NEGATIVE
Toxigenic C. Difficile by PCR: NEGATIVE

## 2023-12-14 MED ORDER — ACETAMINOPHEN 325 MG PO TABS: 650.0000 mg | ORAL_TABLET | Freq: Four times a day (QID) | ORAL | Status: DC | PRN

## 2023-12-14 MED ORDER — ONDANSETRON HCL 4 MG/2ML IJ SOLN
4.0000 mg | Freq: Once | INTRAMUSCULAR | Status: AC
  Administered 2023-12-14: 4 mg via INTRAVENOUS
  Filled 2023-12-14: qty 2

## 2023-12-14 MED ORDER — ATORVASTATIN CALCIUM 40 MG PO TABS
80.0000 mg | ORAL_TABLET | Freq: Every day | ORAL | Status: DC
Start: 2023-12-15 — End: 2023-12-16
  Administered 2023-12-15 – 2023-12-16 (×2): 80 mg via ORAL
  Filled 2023-12-14 (×2): qty 2

## 2023-12-14 MED ORDER — AMLODIPINE BESYLATE 5 MG PO TABS
2.5000 mg | ORAL_TABLET | Freq: Every day | ORAL | Status: DC
Start: 2023-12-14 — End: 2023-12-14

## 2023-12-14 MED ORDER — HYDROMORPHONE HCL 1 MG/ML IJ SOLN
1.0000 mg | Freq: Once | INTRAMUSCULAR | Status: AC
  Administered 2023-12-14: 1 mg via INTRAVENOUS
  Filled 2023-12-14: qty 1

## 2023-12-14 MED ORDER — CLOPIDOGREL BISULFATE 75 MG PO TABS
75.0000 mg | ORAL_TABLET | Freq: Every day | ORAL | Status: DC
  Administered 2023-12-14: 75 mg via ORAL
  Filled 2023-12-14: qty 1

## 2023-12-14 MED ORDER — MORPHINE SULFATE ER 15 MG PO TBCR
15.0000 mg | EXTENDED_RELEASE_TABLET | Freq: Two times a day (BID) | ORAL | Status: DC
  Administered 2023-12-14 – 2023-12-16 (×4): 15 mg via ORAL
  Filled 2023-12-14 (×4): qty 1

## 2023-12-14 MED ORDER — DILTIAZEM HCL ER COATED BEADS 120 MG PO CP24
120.0000 mg | ORAL_CAPSULE | Freq: Every day | ORAL | Status: DC
Start: 2023-12-14 — End: 2023-12-14

## 2023-12-14 MED ORDER — ACETAMINOPHEN 500 MG PO TABS
1000.0000 mg | ORAL_TABLET | Freq: Once | ORAL | Status: AC
  Administered 2023-12-14: 1000 mg via ORAL
  Filled 2023-12-14: qty 2

## 2023-12-14 MED ORDER — PANTOPRAZOLE SODIUM 40 MG PO TBEC
40.0000 mg | DELAYED_RELEASE_TABLET | Freq: Every day | ORAL | Status: DC
Start: 2023-12-15 — End: 2023-12-16
  Administered 2023-12-15 – 2023-12-16 (×2): 40 mg via ORAL
  Filled 2023-12-14 (×2): qty 1

## 2023-12-14 MED ORDER — CLOPIDOGREL BISULFATE 75 MG PO TABS
75.0000 mg | ORAL_TABLET | Freq: Every day | ORAL | Status: DC
Start: 2023-12-15 — End: 2023-12-16
  Administered 2023-12-15 – 2023-12-16 (×2): 75 mg via ORAL
  Filled 2023-12-14 (×2): qty 1

## 2023-12-14 MED ORDER — HYDRALAZINE HCL 20 MG/ML IJ SOLN: 10.0000 mg | INTRAMUSCULAR | Status: DC | PRN

## 2023-12-14 MED ORDER — ONDANSETRON HCL 4 MG PO TABS: 4.0000 mg | ORAL_TABLET | Freq: Four times a day (QID) | ORAL | Status: DC | PRN

## 2023-12-14 MED ORDER — POTASSIUM CHLORIDE IN NACL 40-0.9 MEQ/L-% IV SOLN
INTRAVENOUS | Status: DC
  Filled 2023-12-14 (×2): qty 1000

## 2023-12-14 MED ORDER — POTASSIUM CHLORIDE CRYS ER 20 MEQ PO TBCR: 40.0000 meq | EXTENDED_RELEASE_TABLET | Freq: Once | ORAL | Status: DC

## 2023-12-14 MED ORDER — SODIUM CHLORIDE 0.9% FLUSH
3.0000 mL | Freq: Two times a day (BID) | INTRAVENOUS | Status: DC
  Administered 2023-12-14 – 2023-12-15 (×3): 3 mL via INTRAVENOUS

## 2023-12-14 MED ORDER — LEVETIRACETAM 500 MG PO TABS
500.0000 mg | ORAL_TABLET | Freq: Two times a day (BID) | ORAL | Status: DC
  Administered 2023-12-14: 500 mg via ORAL
  Filled 2023-12-14: qty 1

## 2023-12-14 MED ORDER — ONDANSETRON HCL 4 MG/2ML IJ SOLN
4.0000 mg | Freq: Four times a day (QID) | INTRAMUSCULAR | Status: DC | PRN
  Administered 2023-12-14: 4 mg via INTRAVENOUS
  Filled 2023-12-14: qty 2

## 2023-12-14 MED ORDER — LABETALOL HCL 5 MG/ML IV SOLN: 10.0000 mg | INTRAVENOUS | Status: DC | PRN

## 2023-12-14 MED ORDER — IPRATROPIUM-ALBUTEROL 0.5-2.5 (3) MG/3ML IN SOLN: 3.0000 mL | Freq: Four times a day (QID) | RESPIRATORY_TRACT | Status: DC | PRN

## 2023-12-14 MED ORDER — ACETAMINOPHEN 650 MG RE SUPP: 650.0000 mg | Freq: Four times a day (QID) | RECTAL | Status: DC | PRN

## 2023-12-14 MED ORDER — RANOLAZINE ER 500 MG PO TB12
500.0000 mg | ORAL_TABLET | Freq: Two times a day (BID) | ORAL | Status: DC
  Administered 2023-12-14 – 2023-12-16 (×4): 500 mg via ORAL
  Filled 2023-12-14 (×5): qty 1

## 2023-12-14 MED ORDER — GABAPENTIN 400 MG PO CAPS
800.0000 mg | ORAL_CAPSULE | Freq: Three times a day (TID) | ORAL | Status: DC
  Administered 2023-12-15 – 2023-12-16 (×5): 800 mg via ORAL
  Filled 2023-12-14 (×6): qty 2

## 2023-12-14 MED ORDER — CITALOPRAM HYDROBROMIDE 20 MG PO TABS
10.0000 mg | ORAL_TABLET | Freq: Every day | ORAL | Status: DC
  Administered 2023-12-15 – 2023-12-16 (×2): 10 mg via ORAL
  Filled 2023-12-14 (×2): qty 1

## 2023-12-14 MED ORDER — BUDESON-GLYCOPYRROL-FORMOTEROL 160-9-4.8 MCG/ACT IN AERO
2.0000 | INHALATION_SPRAY | Freq: Two times a day (BID) | RESPIRATORY_TRACT | Status: DC
  Administered 2023-12-15 – 2023-12-16 (×3): 2 via RESPIRATORY_TRACT
  Filled 2023-12-14: qty 5.9

## 2023-12-14 MED ORDER — ENOXAPARIN SODIUM 40 MG/0.4ML IJ SOSY
40.0000 mg | PREFILLED_SYRINGE | Freq: Every day | INTRAMUSCULAR | Status: DC
  Administered 2023-12-15 – 2023-12-16 (×2): 40 mg via SUBCUTANEOUS
  Filled 2023-12-14 (×2): qty 0.4

## 2023-12-14 MED ORDER — LEVETIRACETAM 500 MG PO TABS
500.0000 mg | ORAL_TABLET | Freq: Two times a day (BID) | ORAL | Status: DC
  Administered 2023-12-14 – 2023-12-16 (×4): 500 mg via ORAL
  Filled 2023-12-14 (×4): qty 1

## 2023-12-14 NOTE — Assessment & Plan Note (Signed)
-   likely hemoconcentrated in setting of GI losses but does run on higher end of normal historically - continue IVF and repeat CBC in am

## 2023-12-14 NOTE — ED Notes (Signed)
 Multiple attempts have been made to collect the stool sample.  Pt is having a hard time with same even with collection hat.  Pt notes its due to the fact she often passes urine and stool at the same time.

## 2023-12-14 NOTE — ED Notes (Signed)
 Called [facility] for transport, spoke to [place].

## 2023-12-14 NOTE — Assessment & Plan Note (Signed)
 -  Continue Lipitor

## 2023-12-14 NOTE — Assessment & Plan Note (Signed)
-   No signs symptoms of exacerbation - Continue breathing treatments

## 2023-12-14 NOTE — Plan of Care (Signed)
  Problem: Education: Goal: Knowledge of General Education information will improve Description: Including pain rating scale, medication(s)/side effects and non-pharmacologic comfort measures Outcome: Progressing   Problem: Clinical Measurements: Goal: Will remain free from infection Outcome: Progressing Goal: Diagnostic test results will improve Outcome: Progressing Goal: Respiratory complications will improve Outcome: Progressing Goal: Cardiovascular complication will be avoided Outcome: Progressing   Problem: Nutrition: Goal: Adequate nutrition will be maintained Outcome: Progressing   Problem: Coping: Goal: Level of anxiety will decrease Outcome: Progressing   Problem: Elimination: Goal: Will not experience complications related to bowel motility Outcome: Progressing Goal: Will not experience complications related to urinary retention Outcome: Progressing   Problem: Pain Managment: Goal: General experience of comfort will improve and/or be controlled Outcome: Progressing   Problem: Safety: Goal: Ability to remain free from injury will improve Outcome: Progressing   Problem: Skin Integrity: Goal: Risk for impaired skin integrity will decrease Outcome: Progressing

## 2023-12-14 NOTE — Assessment & Plan Note (Signed)
-   Due to ongoing GI losses with underlying diarrhea - Replete as needed - BMP in a.m.

## 2023-12-14 NOTE — Assessment & Plan Note (Addendum)
-   Appears to have underlying probable GI illness, possibly viral gastroenteritis.  CT shows nonspecific enterocolitis - She is colonized with C. difficile but not infected (Ag pos, toxin neg, PCR neg) - follow up GI panel - continue enteric precautions  - FLD okay - continue IVF and supportive care; not tolerating adequate nutritional intake yet

## 2023-12-14 NOTE — Assessment & Plan Note (Signed)
-   reportedly on 2L chronic home O2

## 2023-12-14 NOTE — ED Provider Notes (Signed)
 Care assumed at shift change pending call back from Hospitalist. Discussed with Dr. Marcene who will accept for admission.    Roselyn Carlin NOVAK, MD 12/14/23 [phone]

## 2023-12-14 NOTE — Assessment & Plan Note (Signed)
-   PRN meds until med rec complete

## 2023-12-14 NOTE — ED Notes (Signed)
 Asked if patient wanted to wash up/ patient stated not right now Hot packs and warm blankets given for comfort

## 2023-12-14 NOTE — Assessment & Plan Note (Signed)
-   no residual deficits - follow up med rec

## 2023-12-14 NOTE — Assessment & Plan Note (Signed)
 Continue Keppra

## 2023-12-14 NOTE — H&P (Addendum)
 History and Physical    Donna Little  FMW:[id number]  DOB: April 18, 1945  DOA: 12/13/2023  PCP: Donna Clapper, MD Patient coming from: Home   Chief Complaint: N/V, diarrhea   HPI:  Donna Little is an 78 year old female with PMH chronic hypoxia on 2 L oxygen , emphysema, SVT, depression, GERD, HTN, IBS, PMR, pulmonary hypertension, history of PE, CVA, DM II who presented to [facility] with fatigue, abd pain, and diarrhea.   She endorsed eating what she thinks was bad chicken from [facility] approximately 3 weeks ago.  She states her son had diarrhea for approximately 1 day and then got better but hers continued. She then tried to push extra hydration just prior to admission and became nauseous and began vomiting for approximately 1 day. She has had abdominal discomfort prior to the vomiting episodes.  She was recently seen at urgent care and diagnosed with possible bronchitis and prescribed doxycycline  and prednisone .  She had only taken 2 days of doxycycline  prior to admission due to inability to keep the pill down.  On workup she was found to be afebrile, 97.6.  Unremarkable heart rate and respirations.  Blood pressure relatively controlled and SpO2 100% on 2 L. WBC nonelevated, 7. She had hypokalemia on workup. UA negative for signs of infection. Stool studies were ordered for testing.  C. difficile testing negative however is positive for C. difficile antigen (toxin negative, PCR negative).  GI pathogen panel pending at time of transfer. CT A/P was performed which showed fluid-filled small bowel with mild thickening with findings suggestive for nonspecific enterocolitis.  She is admitted due to inability for inadequate oral nutrition and electrolyte abnormalities.  I have personally briefly reviewed patient's old medical records in [facility] and discussed patient with the ER provider when appropriate/indicated.  Assessment and Plan: * Enterocolitis - Appears to have underlying probable GI  illness, possibly viral gastroenteritis.  CT shows nonspecific enterocolitis - She is colonized with C. difficile but not infected (Ag pos, toxin neg, PCR neg) - follow up GI panel - continue enteric precautions  - FLD okay - continue IVF and supportive care; not tolerating adequate nutritional intake yet   Hypokalemia - Due to ongoing GI losses with underlying diarrhea - Replete as needed - BMP in a.m.  Chronic hypoxic respiratory failure (HCC) - reportedly on 2L chronic home O2  History of supraventricular tachycardia - Follows with cardiology -Follow-up med rec to resume meds  History of seizure - Continue Keppra   History of CVA (cerebrovascular accident) - no residual deficits - follow up med rec  HTN (hypertension) - PRN meds until med rec complete  Erythrocytosis - likely hemoconcentrated in setting of GI losses but does run on higher end of normal historically - continue IVF and repeat CBC in am  COPD mixed type (HCC) - No signs symptoms of exacerbation - Continue breathing treatments  Hyperlipidemia - Continue Lipitor   Code Status:     Code Status: Full Code  DVT Prophylaxis:   enoxaparin  (LOVENOX ) injection 40 mg Start: 12/15/23 1000   Anticipated disposition is to: Home   History: Past Medical History:  Diagnosis Date   A-fib (HCC)    Atherosclerosis of both carotid arteries 2022   noted per CT on 05/14/20   Chronic pain syndrome    Chronic systolic heart failure (HCC)    Depression    Emphysema of lung (HCC) 2022   noted per CT scan on 05/14/20   Generalized atherosclerosis    GERD (gastroesophageal  reflux disease)    Hypertension    IBS (irritable bowel syndrome)    Melanoma (HCC)    Other specified nontoxic goiter    Palpitations    PMR (polymyalgia rheumatica) (HCC) 07/04/2020   Polymyalgia rheumatica (HCC)    Primary pulmonary hypertension (HCC)    Pulmonary embolism (HCC)    Stroke (HCC)    ministroke times 3 Right side weakness    Supraventricular tachycardia (HCC)    Type 2 diabetes mellitus (HCC)    diet controlled    Past Surgical History:  Procedure Laterality Date   ABDOMINAL HYSTERECTOMY     APPENDECTOMY     CATARACT EXTRACTION     CORONARY ANGIOPLASTY WITH STENT PLACEMENT  2021   KYPHOPLASTY  05/2020   L4-L5   TRIGGER FINGER RELEASE Right 03/17/2023   Procedure: right middle and ring finger RELEASE TRIGGER FINGER/A-1 PULLEY;  Surgeon: Donna Harari, MD;  Location: [facility];  Service: Orthopedics;  Laterality: Right;  mac and local     reports that she has been smoking cigarettes. She started smoking about 60 years ago. She has a 65.3 pack-year smoking history. She has never used smokeless tobacco. She reports that she does not drink alcohol and does not use drugs.  Allergies  Allergen Reactions   Codeine Other (See Comments)    It made me crazy   Macrobid  [Nitrofurantoin ] Nausea And Vomiting   Methocarbamol Other (See Comments)    sleepy   Sertraline Other (See Comments)    Insomnia    Tizanidine Other (See Comments)    sleepy    Family History  Problem Relation Age of Onset   Heart disease Mother    Diabetes Mother    Hyperlipidemia Mother    Hypertension Mother    Stroke Mother    Heart attack Mother    Heart disease Brother    Heart attack Brother    Stroke Brother    Hypertension Brother    Hyperlipidemia Brother     Home Medications: Prior to Admission medications   Medication Sig Start Date End Date Taking? Authorizing Provider  acetaminophen  (TYLENOL ) 325 MG tablet Take by mouth. 05/09/20   [provider]  albuterol  (VENTOLIN  HFA) 108 (90 Base) MCG/ACT inhaler Inhale 2 puffs into the lungs every 6 (six) hours as needed. 06/03/22   CoxAbigail, MD  amLODipine  (NORVASC ) 2.5 MG tablet Take 1 tablet (2.5 mg total) by mouth daily. 11/24/23   CoxAbigail, MD  atorvastatin  (LIPITOR) 80 MG tablet Take 1 tablet (80 mg total) by mouth daily. 06/21/23    CoxAbigail, MD  citalopram  (CELEXA ) 10 MG tablet Take 1 tablet (10 mg total) by mouth daily. 11/24/23   Donna Abigail, MD  clopidogrel  (PLAVIX ) 75 MG tablet Take 1 tablet by mouth once daily 11/11/23   Cox, Kirsten, MD  dicyclomine  (BENTYL ) 20 MG tablet Take 1 tablet (20 mg total) by mouth 4 (four) times daily -  before meals and at bedtime. 09/01/23   CoxAbigail, MD  diltiazem  (CARDIZEM  CD) 120 MG 24 hr capsule Take 1 capsule (120 mg total) by mouth daily. 12/01/23   CoxAbigail, MD  doxycycline  (VIBRAMYCIN ) 100 MG capsule Take 1 capsule (100 mg total) by mouth 2 (two) times daily for 10 days. 12/07/23 12/17/23  Ival Domino, FNP  Fluticasone-Umeclidin-Vilant (TRELEGY ELLIPTA ) 100-62.5-25 MCG/ACT AEPB Inhale into the lungs.    [provider]  furosemide  (LASIX ) 40 MG tablet Take 2 tablets by mouth twice daily 11/07/23  Cox, Kirsten, MD  gabapentin  (NEURONTIN ) 800 MG tablet Take 1 tablet (800 mg total) by mouth 3 (three) times daily. 09/15/23   Cox, Abigail, MD  ipratropium-albuterol  (DUONEB) 0.5-2.5 (3) MG/3ML SOLN Take 3 mLs by nebulization every 6 (six) hours as needed. 12/07/23   Ival Domino, FNP  levETIRAcetam  (KEPPRA ) 500 MG tablet Take 1 tablet (500 mg total) by mouth 2 (two) times daily. 10/14/23   CoxAbigail, MD  meclizine  (ANTIVERT ) 25 MG tablet Take 1 tablet (25 mg total) by mouth every 8 (eight) hours as needed for dizziness. 07/29/22   CoxAbigail, MD  morphine  (MS CONTIN ) 15 MG 12 hr tablet Take 1 tablet (15 mg total) by mouth every 12 (twelve) hours. 11/17/23   CoxAbigail, MD  naloxone  (NARCAN ) nasal spray 4 mg/0.1 mL 1 spray (4 mg) intranasally into 1 nostril. Use a new Narcan (R) nasal spray for subsequent doses and administer into alternating nostrils. May repeat every 2 to 3 minutes 11/17/23   Cox, Kirsten, MD  nitroGLYCERIN (NITROSTAT) 0.4 MG SL tablet Place under the tongue. Patient taking differently: Place 0.4 mg under the tongue every 5 (five) minutes as needed for chest  pain.    [provider]  pantoprazole  (PROTONIX ) 40 MG tablet Take 1 tablet (40 mg total) by mouth daily. 10/20/23   CoxAbigail, MD  potassium chloride  SA (KLOR-CON  M) 20 MEQ tablet Take 1 tablet (20 mEq total) by mouth 2 (two) times daily with a meal. 10/20/23   Cox, Kirsten, MD  promethazine -dextromethorphan (PROMETHAZINE -DM) 6.25-15 MG/5ML syrup Take 5 mLs by mouth 4 (four) times daily as needed for cough. Do not use and drive - May make drowsy. 12/07/23   Ival Domino, FNP  ranolazine  (RANEXA ) 500 MG 12 hr tablet Take 1 tablet (500 mg total) by mouth 2 (two) times daily. 10/20/23   CoxAbigail, MD  Romosozumab -aqqg (EVENITY ) 105 MG/1.17ML SOSY injection Inject 210 mg into the skin once.    [provider]    Review of Systems:  Review of Systems  Constitutional: Negative.   HENT: Negative.    Eyes: Negative.   Respiratory: Negative.    Cardiovascular: Negative.   Gastrointestinal:  Positive for abdominal pain, diarrhea, nausea and vomiting.  Genitourinary: Negative.   Musculoskeletal: Negative.   Skin: Negative.   Neurological: Negative.   Endo/Heme/Allergies: Negative.   Psychiatric/Behavioral: Negative.      Physical Exam:  Vitals:   12/14/23 0930 12/14/23 1200 12/14/23 1404 12/14/23 1601  BP: (!) 172/81  (!) 184/94 110/74  Pulse: 79 87 86 78  Resp: 15 18 18 16   Temp:   98.8 F (37.1 C)   TempSrc:   Oral Oral  SpO2: 96% 100% 99% 92%  Weight:      Height:       Physical Exam Constitutional:      Appearance: Normal appearance.  HENT:     Head: Normocephalic and atraumatic.     Mouth/Throat:     Mouth: Mucous membranes are moist.  Eyes:     Extraocular Movements: Extraocular movements intact.  Cardiovascular:     Rate and Rhythm: Normal rate and regular rhythm.  Pulmonary:     Effort: Pulmonary effort is normal. No respiratory distress.     Breath sounds: Normal breath sounds. No wheezing.  Abdominal:     General: Bowel sounds are normal. There  is no distension.     Palpations: Abdomen is soft.     Tenderness: There is generalized abdominal tenderness.  Musculoskeletal:        General: Normal range of motion.     Cervical back: Normal range of motion and neck supple.     Comments: Healing scab left lateral malleolus   Skin:    General: Skin is warm and dry.  Neurological:     General: No focal deficit present.     Mental Status: She is alert.  Psychiatric:        Mood and Affect: Mood normal.      Labs on Admission:  I have personally reviewed following labs and imaging studies Results for orders placed or performed during the hospital encounter of 12/13/23 (from the past 24 hours)  CBC     Status: Abnormal   Collection Time: 12/13/23  7:01 PM  Result Value Ref Range   WBC 7.0 4.0 - 10.5 K/uL   RBC 5.18 (H) 3.87 - 5.11 MIL/uL   Hemoglobin 16.9 (H) 12.0 - 15.0 g/dL   HCT 53.6 (H) 63.9 - 53.9 %   MCV 89.4 80.0 - 100.0 fL   MCH 32.6 26.0 - 34.0 pg   MCHC 36.5 (H) 30.0 - 36.0 g/dL   RDW 87.1 88.4 - 84.4 %   Platelets 193 150 - 400 K/uL   nRBC 0.0 0.0 - 0.2 %  Troponin T, High Sensitivity     Status: None   Collection Time: 12/13/23  7:01 PM  Result Value Ref Range   Troponin T High Sensitivity 19 0 - 19 ng/L  Comprehensive metabolic panel     Status: Abnormal   Collection Time: 12/13/23  7:01 PM  Result Value Ref Range   Sodium 143 135 - 145 mmol/L   Potassium 2.7 (LL) 3.5 - 5.1 mmol/L   Chloride 104 98 - 111 mmol/L   CO2 23 22 - 32 mmol/L   Glucose, Bld 142 (H) 70 - 99 mg/dL   BUN 15 8 - 23 mg/dL   Creatinine, Ser 9.16 0.44 - 1.00 mg/dL   Calcium  9.0 8.9 - 10.3 mg/dL   Total Protein 6.6 6.5 - 8.1 g/dL   Albumin 4.4 3.5 - 5.0 g/dL   AST 30 15 - 41 U/L   ALT 16 0 - 44 U/L   Alkaline Phosphatase 136 (H) 38 - 126 U/L   Total Bilirubin 0.9 0.0 - 1.2 mg/dL   GFR, Estimated >39 >39 mL/min   Anion gap 17 (H) 5 - 15  Lipase, blood     Status: None   Collection Time: 12/13/23  7:01 PM  Result Value Ref Range    Lipase 44 11 - 51 U/L  Urinalysis, w/ Reflex to Culture (Infection Suspected) -Urine, Clean Catch     Status: Abnormal   Collection Time: 12/13/23  7:01 PM  Result Value Ref Range   Specimen Source URINE, CLEAN CATCH    Color, Urine YELLOW YELLOW   APPearance CLEAR CLEAR   Specific Gravity, Urine 1.015 1.005 - 1.030   pH 8.5 (H) 5.0 - 8.0   Glucose, UA NEGATIVE NEGATIVE mg/dL   Hgb urine dipstick NEGATIVE NEGATIVE   Bilirubin Urine NEGATIVE NEGATIVE   Ketones, ur 15 (A) NEGATIVE mg/dL   Protein, ur NEGATIVE NEGATIVE mg/dL   Nitrite NEGATIVE NEGATIVE   Leukocytes,Ua NEGATIVE NEGATIVE   Squamous Epithelial / HPF 0-5 0 - 5 /HPF   WBC, UA 0-5 0 - 5 WBC/hpf   RBC / HPF NONE SEEN 0 - 5 RBC/hpf   Bacteria, UA RARE (A) NONE SEEN  Magnesium  Status: None   Collection Time: 12/13/23  7:01 PM  Result Value Ref Range   Magnesium 2.0 1.7 - 2.4 mg/dL  Lactic acid, plasma     Status: None   Collection Time: 12/13/23  7:01 PM  Result Value Ref Range   Lactic Acid, Venous 1.1 0.5 - 1.9 mmol/L  Lactic acid, plasma     Status: None   Collection Time: 12/13/23  7:01 PM  Result Value Ref Range   Lactic Acid, Venous 0.8 0.5 - 1.9 mmol/L  Troponin T, High Sensitivity     Status: Abnormal   Collection Time: 12/13/23  8:39 PM  Result Value Ref Range   Troponin T High Sensitivity 20 (H) 0 - 19 ng/L  C Difficile Quick Screen w PCR reflex     Status: Abnormal   Collection Time: 12/14/23  8:10 AM   Specimen: STOOL  Result Value Ref Range   C Diff antigen POSITIVE (A) NEGATIVE   C Diff toxin NEGATIVE NEGATIVE   C Diff interpretation Results are indeterminate. See PCR results.   C. Diff by PCR, Reflexed     Status: None   Collection Time: 12/14/23  8:10 AM  Result Value Ref Range   Toxigenic C. Difficile by PCR NEGATIVE NEGATIVE   Hypervirulent Strain PRESUMPTIVE NEGATIVE PRESUMPTIVE NEGATIVE  Basic metabolic panel     Status: Abnormal   Collection Time: 12/14/23  9:17 AM  Result Value Ref  Range   Sodium 143 135 - 145 mmol/L   Potassium 3.0 (L) 3.5 - 5.1 mmol/L   Chloride 108 98 - 111 mmol/L   CO2 23 22 - 32 mmol/L   Glucose, Bld 91 70 - 99 mg/dL   BUN 11 8 - 23 mg/dL   Creatinine, Ser 9.23 0.44 - 1.00 mg/dL   Calcium  8.6 (L) 8.9 - 10.3 mg/dL   GFR, Estimated >39 >39 mL/min   Anion gap 12 5 - 15     Radiological Exams on Admission: CT ABDOMEN PELVIS W CONTRAST Result Date: 12/13/2023 EXAM: CT ABDOMEN AND PELVIS WITH CONTRAST 12/13/2023 09:47:58 PM TECHNIQUE: CT of the abdomen and pelvis was performed with the administration of intravenous contrast (100mL iohexol  (OMNIPAQUE ) 300 MG/ML solution). Multiplanar reformatted images are provided for review. Automated exposure control, iterative reconstruction, and/or weight-based adjustment of the mA/kV was utilized to reduce the radiation dose to as low as reasonably achievable. COMPARISON: CT 06/20/2023 CLINICAL HISTORY: Abdominal pain, acute, nonlocalized. 78 y/o female reports increased shob x 1 week, subjective fever. Reports emesis today. Diarrhea x 3 weeks, abd pain x 1 week. Surgeries: Total hysterectomy; Appendectomy, multiple spine surgeries. FINDINGS: LOWER CHEST: No acute abnormality. LIVER: The liver is unremarkable. GALLBLADDER AND BILE DUCTS: Gallbladder is unremarkable. No biliary ductal dilatation. SPLEEN: No acute abnormality. PANCREAS: No acute abnormality. ADRENAL GLANDS: No acute abnormality. KIDNEYS, URETERS AND BLADDER: No stones in the kidneys or ureters. No hydronephrosis. No perinephric or periureteral stranding. Urinary bladder is unremarkable. GI AND BOWEL: Fluid-filled small bowel with possible mild thickening of the wall and valvulae conniventes. Mild wall thickening and mucosal hyperenhancement of the colon versus underdistention. There is no bowel obstruction. PERITONEUM AND RETROPERITONEUM: No ascites. No free air. VASCULATURE: Advanced arterial atherosclerotic calcification including the aorta and its mesenteric  and iliac artery branches. Severe narrowing of the SMA at the origin with possible occlusion (series 10 image 99). Correlation for signs and symptoms of chronic mesenteric ischemia is recommended. LYMPH NODES: No lymphadenopathy. REPRODUCTIVE ORGANS: No acute abnormality. BONES AND SOFT  TISSUES: Posterior fusion in the thoracolumbar spine. Multiple chronic vertebral body compression fractures. Vertebroplasty of L5. No acute fracture. No focal soft tissue abnormality. IMPRESSION: 1. Findings are suggestive of nonspecific enterocolitis. 2. 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 Recommend correlation for signs and symptoms of chronic mesenteric ischemia and correlation with lactate. Electronically signed by: Norman Gatlin MD 12/13/2023 10:13 PM EDT RP Workstation: [id number]   DG Chest 2 View Result Date: 12/13/2023 CLINICAL DATA:  Short of breath EXAM: CHEST - 2 VIEW COMPARISON:  12/13/2023 FINDINGS: Frontal and lateral views of the chest demonstrate a stable cardiac silhouette. No acute airspace disease, effusion, or pneumothorax. Stable postsurgical changes of the thoracolumbar spine. IMPRESSION: 1. Stable chest, no acute process. Electronically Signed   By: Ozell Daring M.D.   On: 12/13/2023 19:44   CT ABDOMEN PELVIS W CONTRAST  Final Result    DG Chest 2 View  Final Result      Consults called:     EKG: Independently reviewed. NSR   Alm Apo, MD [facility] 12/14/2023, 4:52 PM

## 2023-12-14 NOTE — Hospital Course (Addendum)
 Donna Little is an 78 year old female with PMH chronic hypoxia on 2 L oxygen , emphysema, SVT, depression, GERD, HTN, IBS, PMR, pulmonary hypertension, history of PE, CVA, DM II who presented to [facility] with fatigue, abd pain, and diarrhea.   She endorsed eating what she thinks was bad chicken from [facility] approximately 3 weeks ago.  She states her son had diarrhea for approximately 1 day and then got better but hers continued. She then tried to push extra hydration just prior to admission and became nauseous and began vomiting for approximately 1 day. She has had abdominal discomfort prior to the vomiting episodes.  She was recently seen at urgent care and diagnosed with possible bronchitis and prescribed doxycycline  and prednisone .  She had only taken 2 days of doxycycline  prior to admission due to inability to keep the pill down.  On workup she was found to be afebrile, 97.6.  Unremarkable heart rate and respirations.  Blood pressure relatively controlled and SpO2 100% on 2 L. WBC nonelevated, 7. She had hypokalemia on workup. UA negative for signs of infection. Stool studies were ordered for testing.  C. difficile testing negative however is positive for C. difficile antigen (toxin negative, PCR negative).  GI pathogen panel pending at time of transfer. CT A/P was performed which showed fluid-filled small bowel with mild thickening with findings suggestive for nonspecific enterocolitis.  She is admitted due to inability for inadequate oral nutrition and electrolyte abnormalities.

## 2023-12-14 NOTE — Assessment & Plan Note (Signed)
-   Follows with cardiology -Follow-up med rec to resume meds

## 2023-12-15 ENCOUNTER — Encounter (HOSPITAL_COMMUNITY): Payer: Self-pay | Admitting: Internal Medicine

## 2023-12-15 DIAGNOSIS — E048 Other specified nontoxic goiter: Secondary | ICD-10-CM | POA: Diagnosis present

## 2023-12-15 DIAGNOSIS — Z833 Family history of diabetes mellitus: Secondary | ICD-10-CM | POA: Diagnosis not present

## 2023-12-15 DIAGNOSIS — E876 Hypokalemia: Secondary | ICD-10-CM | POA: Diagnosis not present

## 2023-12-15 DIAGNOSIS — G894 Chronic pain syndrome: Secondary | ICD-10-CM | POA: Diagnosis present

## 2023-12-15 DIAGNOSIS — F1120 Opioid dependence, uncomplicated: Secondary | ICD-10-CM | POA: Diagnosis present

## 2023-12-15 DIAGNOSIS — I5022 Chronic systolic (congestive) heart failure: Secondary | ICD-10-CM | POA: Diagnosis present

## 2023-12-15 DIAGNOSIS — Z9981 Dependence on supplemental oxygen: Secondary | ICD-10-CM | POA: Diagnosis not present

## 2023-12-15 DIAGNOSIS — R112 Nausea with vomiting, unspecified: Secondary | ICD-10-CM | POA: Diagnosis present

## 2023-12-15 DIAGNOSIS — G40909 Epilepsy, unspecified, not intractable, without status epilepticus: Secondary | ICD-10-CM | POA: Diagnosis present

## 2023-12-15 DIAGNOSIS — Z85820 Personal history of malignant melanoma of skin: Secondary | ICD-10-CM | POA: Diagnosis not present

## 2023-12-15 DIAGNOSIS — K55059 Acute (reversible) ischemia of intestine, part and extent unspecified: Secondary | ICD-10-CM | POA: Diagnosis present

## 2023-12-15 DIAGNOSIS — I110 Hypertensive heart disease with heart failure: Secondary | ICD-10-CM | POA: Diagnosis present

## 2023-12-15 DIAGNOSIS — E785 Hyperlipidemia, unspecified: Secondary | ICD-10-CM | POA: Diagnosis present

## 2023-12-15 DIAGNOSIS — D751 Secondary polycythemia: Secondary | ICD-10-CM | POA: Diagnosis present

## 2023-12-15 DIAGNOSIS — K529 Noninfective gastroenteritis and colitis, unspecified: Secondary | ICD-10-CM | POA: Diagnosis not present

## 2023-12-15 DIAGNOSIS — J439 Emphysema, unspecified: Secondary | ICD-10-CM | POA: Diagnosis present

## 2023-12-15 DIAGNOSIS — M353 Polymyalgia rheumatica: Secondary | ICD-10-CM | POA: Diagnosis present

## 2023-12-15 DIAGNOSIS — E875 Hyperkalemia: Secondary | ICD-10-CM | POA: Diagnosis not present

## 2023-12-15 DIAGNOSIS — Z8249 Family history of ischemic heart disease and other diseases of the circulatory system: Secondary | ICD-10-CM | POA: Diagnosis not present

## 2023-12-15 DIAGNOSIS — J9611 Chronic respiratory failure with hypoxia: Secondary | ICD-10-CM | POA: Diagnosis present

## 2023-12-15 DIAGNOSIS — E860 Dehydration: Secondary | ICD-10-CM | POA: Diagnosis present

## 2023-12-15 DIAGNOSIS — Z87891 Personal history of nicotine dependence: Secondary | ICD-10-CM | POA: Diagnosis not present

## 2023-12-15 DIAGNOSIS — F32A Depression, unspecified: Secondary | ICD-10-CM | POA: Diagnosis present

## 2023-12-15 DIAGNOSIS — A084 Viral intestinal infection, unspecified: Secondary | ICD-10-CM | POA: Diagnosis present

## 2023-12-15 DIAGNOSIS — E119 Type 2 diabetes mellitus without complications: Secondary | ICD-10-CM | POA: Diagnosis present

## 2023-12-15 LAB — CBC WITH DIFFERENTIAL/PLATELET
Abs Immature Granulocytes: 0.02 K/uL (ref 0.00–0.07)
Basophils Absolute: 0.1 K/uL (ref 0.0–0.1)
Basophils Relative: 1 %
Eosinophils Absolute: 0.4 K/uL (ref 0.0–0.5)
Eosinophils Relative: 6 %
HCT: 41.2 % (ref 36.0–46.0)
Hemoglobin: 13.7 g/dL (ref 12.0–15.0)
Immature Granulocytes: 0 %
Lymphocytes Relative: 44 %
Lymphs Abs: 3 K/uL (ref 0.7–4.0)
MCH: 31.6 pg (ref 26.0–34.0)
MCHC: 33.3 g/dL (ref 30.0–36.0)
MCV: 95.2 fL (ref 80.0–100.0)
Monocytes Absolute: 0.7 K/uL (ref 0.1–1.0)
Monocytes Relative: 10 %
Neutro Abs: 2.7 K/uL (ref 1.7–7.7)
Neutrophils Relative %: 39 %
Platelets: 147 K/uL — ABNORMAL LOW (ref 150–400)
RBC: 4.33 MIL/uL (ref 3.87–5.11)
RDW: 13.2 % (ref 11.5–15.5)
WBC: 6.8 K/uL (ref 4.0–10.5)
nRBC: 0 % (ref 0.0–0.2)

## 2023-12-15 LAB — BASIC METABOLIC PANEL WITH GFR
Anion gap: 11 (ref 5–15)
BUN: 7 mg/dL — ABNORMAL LOW (ref 8–23)
CO2: 20 mmol/L — ABNORMAL LOW (ref 22–32)
Calcium: 8.5 mg/dL — ABNORMAL LOW (ref 8.9–10.3)
Chloride: 109 mmol/L (ref 98–111)
Creatinine, Ser: 0.58 mg/dL (ref 0.44–1.00)
GFR, Estimated: >60 mL/min (ref >60)
Glucose, Bld: 72 mg/dL (ref 70–99)
Potassium: 3.3 mmol/L — ABNORMAL LOW (ref 3.5–5.1)
Sodium: 140 mmol/L (ref 135–145)

## 2023-12-15 LAB — GASTROINTESTINAL PANEL BY PCR, STOOL (REPLACES STOOL CULTURE)

## 2023-12-15 LAB — MAGNESIUM: Magnesium: 2.2 mg/dL (ref 1.7–2.4)

## 2023-12-15 MED ORDER — POTASSIUM CHLORIDE CRYS ER 20 MEQ PO TBCR
40.0000 meq | EXTENDED_RELEASE_TABLET | ORAL | Status: AC
  Administered 2023-12-15 (×2): 40 meq via ORAL
  Filled 2023-12-15 (×2): qty 2

## 2023-12-15 MED ORDER — POTASSIUM CHLORIDE IN NACL 40-0.9 MEQ/L-% IV SOLN
INTRAVENOUS | Status: DC
  Filled 2023-12-15 (×2): qty 1000

## 2023-12-15 NOTE — TOC Initial Note (Signed)
 Transition of Care [facility]) - Initial/Assessment Note    Patient Details  Name: Donna Little MRN: [id number] Date of Birth: February 12, 1946  Transition of Care [facility]) CM/SW Contact:    Jon ONEIDA Anon, RN Phone Number: 12/15/2023, 3:05 PM  Clinical Narrative:                 Pt is from home. Pt confirms PCP/insurance. Pt denies any HH, DME, or SDOH needs at this time. Pt needing continued medical work up, not medically stable for DC. IP Care Management following for any new recommendations or DC needs.    Expected Discharge Plan: Home/Self Care Barriers to Discharge: Continued Medical Work up   Patient Goals and CMS Choice Patient states their goals for this hospitalization and ongoing recovery are:: Return home CMS Medicare.gov Compare Post Acute Care list provided to:: Other (Comment Required) (NA) Choice offered to / list presented to : NA [place] ownership interest in [facility].provided to:: Parent NA    Expected Discharge Plan and Services In-house Referral: NA Discharge Planning Services: CM Consult Post Acute Care Choice: Durable Medical Equipment Living arrangements for the past 2 months: Single Family Home                 DME Arranged: N/A DME Agency: NA       HH Arranged: NA HH Agency: NA        Prior Living Arrangements/Services Living arrangements for the past 2 months: Single Family Home Lives with:: Relatives Patient language and need for interpreter reviewed:: Yes Do you feel safe going back to the place where you live?: Yes      Need for Family Participation in Patient Care: Yes (Comment) Care giver support system in place?: Yes (comment) Current home services: DME (RW, BSC, W/C) Criminal Activity/Legal Involvement Pertinent to Current Situation/Hospitalization: No - Comment as needed  Activities of Daily Living   ADL Screening (condition at time of admission) Independently performs ADLs?: No Does the patient have a NEW difficulty with  bathing/dressing/toileting/self-feeding that is expected to last >3 days?: No Does the patient have a NEW difficulty with getting in/out of bed, walking, or climbing stairs that is expected to last >3 days?: No Does the patient have a NEW difficulty with communication that is expected to last >3 days?: No Is the patient deaf or have difficulty hearing?: No Does the patient have difficulty seeing, even when wearing glasses/contacts?: No Does the patient have difficulty concentrating, remembering, or making decisions?: No  Permission Sought/Granted Permission sought to share information with : Family Supports Permission granted to share information with : Yes, Verbal Permission Granted  Share Information with NAME: Orlando Browning (Daughter)  [phone]           Emotional Assessment Appearance:: Appears stated age Attitude/Demeanor/Rapport: Engaged Affect (typically observed): Accepting, Appropriate Orientation: : Oriented to Self, Oriented to Place, Oriented to  Time, Oriented to Situation Alcohol / Substance Use: Not Applicable Psych Involvement: No (comment)  Admission diagnosis:  Hypokalemia [E87.6] Diarrhea, unspecified type [R19.7] Nausea and vomiting, unspecified vomiting type [R11.2] Patient Active Problem List   Diagnosis Date Noted   Enterocolitis 12/14/2023   Erythrocytosis 12/14/2023   Chronic hypoxic respiratory failure (HCC) 12/14/2023   HTN (hypertension) 12/14/2023   History of CVA (cerebrovascular accident) 12/14/2023   History of seizure 12/14/2023   History of supraventricular tachycardia 12/14/2023   Hypokalemia 12/13/2023   Impingement syndrome of right shoulder region 10/05/2023   Rheumatoid arthritis involving both hands (HCC) 10/05/2023  Swelling of left hand 08/12/2023   Acute respiratory failure with hypoxia (HCC) 08/09/2023   Pneumonia due to infectious organism 08/09/2023   Minor head trauma 07/15/2023   Right hand pain 06/13/2023   Coronary  artery disease due to lipid rich plaque 06/13/2023   Personal history of tobacco use, presenting hazards to health 06/13/2023   Chronic fatigue 04/12/2023   Primary osteoarthritis 04/12/2023   Chronic kidney disease, stage 3a (HCC) 02/22/2023   Arthralgia of multiple sites 02/22/2023   Borderline diabetes 02/22/2023   Localized swelling, mass or lump of neck 02/22/2023   Acquired trigger finger of right ring finger 02/15/2023   Acquired trigger finger of right middle finger 02/15/2023   Fracture of phalanx of finger 02/15/2023   Hand pain, left 02/15/2023   Cigarette nicotine dependence with nicotine-induced disorder 12/19/2022   Encounter for Medicare annual wellness exam 12/16/2022   Encounter for osteoporosis screening in asymptomatic postmenopausal patient 12/16/2022   Immunization due 12/16/2022   Screening for lung cancer 12/16/2022   Fracture of unspecified phalanx of right middle finger, initial encounter for closed fracture 12/02/2022   Flank pain 12/02/2022   Other chest pain 06/03/2022   Paresthesia 06/03/2022   Vitamin D  insufficiency 06/03/2022   Moderate recurrent major depression (HCC) 04/22/2022   Chronic tension-type headache, intractable 02/18/2022   Lymphadenopathy, cervical 12/14/2021   Chronic nonintractable headache 12/11/2021   Altered mental status 12/11/2021   Memory loss 12/11/2021   Trigger finger, right middle finger 05/05/2021   Telogen effluvium 03/15/2021   Uncomplicated opioid dependence (HCC) 03/15/2021   Aortic atherosclerosis (HCC) 03/15/2021   Intracranial vascular disease 03/15/2021   Chronic midline thoracic back pain 03/15/2021   Osteoporosis, 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   COPD mixed type (HCC) 03/15/2021   GERD (gastroesophageal reflux disease)    Bruit 08/28/2020   S/P kyphoplasty 07/05/2020   Mild recurrent major depression (HCC) 06/27/2020   Prediabetes 06/27/2020   Right hip pain 09/21/2019   Back pain with sciatica 08/21/2019    Incontinence of urine in female 08/21/2019   Trigger finger, right ring finger 06/26/2019   Cerebrovascular disease 01/16/2015   Hyperlipidemia 01/16/2015   Hypertensive heart disease with heart failure (HCC) 01/16/2015   Tobacco use disorder 01/16/2015   PCP:  Sherre Clapper, MD Pharmacy:   [facility] [address], [place] - [address] [place] [address] Phone: [phone] Fax: [phone]     Social Drivers of Health (SDOH) Social History: SDOH Screenings   Food Insecurity: No Food Insecurity (12/15/2023)  Housing: Low Risk  (12/15/2023)  Transportation Needs: No Transportation Needs (12/15/2023)  Utilities: Not At Risk (12/15/2023)  Alcohol Screen: Low Risk  (06/08/2023)  Depression (PHQ2-9): High Risk (11/17/2023)  Financial Resource Strain: Low Risk  (10/11/2023)   Received from [facility]  Physical Activity: Inactive (06/08/2023)  Social Connections: Moderately Isolated (12/15/2023)  Stress: No Stress Concern Present (06/08/2023)  Tobacco Use: High Risk (12/07/2023)  Health Literacy: Adequate Health Literacy (06/08/2023)   SDOH Interventions:     Readmission Risk Interventions     No data to display

## 2023-12-15 NOTE — Care Management Obs Status (Signed)
 MEDICARE OBSERVATION STATUS NOTIFICATION   Patient Details  Name: Donna Little MRN: [id number] Date of Birth: 1945-05-12   Medicare Observation Status Notification Given:  Yes    Jon ONEIDA Anon, RN 12/15/2023, 2:42 PM

## 2023-12-15 NOTE — Progress Notes (Signed)
 [facility] PROGRESS NOTE   Donna Little FMW:[id number] DOB: 09/28/1945 DOA: 12/13/2023  PCP: Sherre Clapper, MD  Brief History: 78 year old female with PMH chronic hypoxia on 2 L oxygen , emphysema, SVT, depression, GERD, HTN, IBS, PMR, pulmonary hypertension, history of PE, CVA, DM II who presented to [facility] with fatigue, abd pain, and diarrhea. She mention eating what she thinks was bad chicken from [facility] approximately 3 weeks ago.  She states her son had diarrhea for approximately 1 day and then got better but hers continued.  WBC was noted to be normal.  CT scan suggested enterocolitis.  She was hospitalized for further management.  She was recently seen at urgent care and diagnosed with possible bronchitis and prescribed doxycycline  and prednisone .  She had only taken 2 days of doxycycline  prior to admission due to inability to keep the pill down.   Consultants: None  Procedures: None    Subjective/Interval History: Patient mentions that her symptoms are improving.  Last diarrhea was yesterday.  No vomiting since yesterday either.  Denies any abdominal pain this morning.  She denies any weight loss in the last few months.  Denies any abdominal pain after eating meals.    Assessment/Plan:  Acute enterocolitis Patient presented with diarrhea.  Possibly viral gastroenteritis. C. difficile panel was positive only for antigen.  Toxin and PCR were negative.  Likely colonization. GI pathogen panel is pending. Diarrhea appears to be improving.  Abdomen is benign on examination. Noted to be on full liquid diet.  Patient wants to continue same diet for today.  Severe narrowing of SMA origin This was incidentally noted on CT scan of the abdomen.  Patient denies any symptoms of mesenteric ischemia.  No weight loss.  No abdominal pain after eating meals.  Unlikely this is contributing to her current presentation.  This can be addressed further in the outpatient setting.  She can be referred  to vascular surgery. She is on antiplatelets and statins.  Hypokalemia Secondary to GI loss.  Magnesium is 2.2 today.  Potassium is 3.3.  Additional supplementation will be ordered.  Chronic respiratory failure with hypoxia On 2 L of oxygen  chronically.  History of SVT Stable.  No evidence of same currently.  History of seizure disorder Continue Keppra   History of CVA No residual deficits.  Stable. Noted to be on Plavix  and statin.  Essential hypertension Occasional high readings noted.  Currently not on any antihypertensives.  Chronic pain syndrome Noted to be on MS Contin .  Reason for this is not entirely clear.  COPD Stable.  No evidence of exacerbation.  DVT Prophylaxis: Lovenox  Code Status: Full code Family Communication: Discussed with patient Disposition Plan: Mobilize.  Anticipate discharge home in 24 to 48 hours  Status is: Observation The patient will require care spanning > 2 midnights and should be moved to inpatient because: IV fluids, continued monitoring      Medications: Scheduled:  atorvastatin   80 mg Oral Daily   budesonide -glycopyrrolate -formoterol   2 puff Inhalation BID   citalopram   10 mg Oral Daily   clopidogrel   75 mg Oral Daily   enoxaparin  (LOVENOX ) injection  40 mg Subcutaneous Daily   gabapentin   800 mg Oral TID   levETIRAcetam   500 mg Oral BID   morphine   15 mg Oral Q12H   pantoprazole   40 mg Oral Daily   potassium chloride   40 mEq Oral Q4H   ranolazine   500 mg Oral BID   sodium chloride  flush  3 mL Intravenous Q12H  Continuous:  0.9 % NaCl with KCl 40 mEq / L 75 mL/hr at 12/15/23 0321   PRN:acetaminophen  **OR** acetaminophen , hydrALAZINE , ipratropium-albuterol , labetalol , ondansetron  **OR** ondansetron  (ZOFRAN ) IV  Antibiotics: Anti-infectives (From admission, onward)    None       Objective:  Vital Signs  Vitals:   12/14/23 1601 12/14/23 2113 12/15/23 0105 12/15/23 0546  BP: 110/74 (!) 148/80 (!) 143/76 (!) 146/71   Pulse: 78 74 73 64  Resp: 16 18 20 17   Temp:   97.8 F (36.6 C) 97.7 F (36.5 C)  TempSrc: Oral  Oral Oral  SpO2: 92% 97% 98% 100%  Weight:      Height:        Intake/Output Summary (Last 24 hours) at 12/15/2023 0929 Last data filed at 12/15/2023 0321 Gross per 24 hour  Intake 638.15 ml  Output 300 ml  Net 338.15 ml   Filed Weights   12/13/23 1831  Weight: 55.8 kg    General appearance: Awake alert.  In no distress Resp: Clear to auscultation bilaterally.  Normal effort Cardio: S1-S2 is normal regular.  No S3-S4.  No rubs murmurs or bruit GI: Abdomen is soft.  Nontender nondistended.  Bowel sounds are present normal.  No masses organomegaly Extremities: No edema.  Full range of motion of lower extremities. Neurologic: Alert and oriented x3.  No focal neurological deficits.    Lab Results:  Data Reviewed: I have personally reviewed following labs and reports of the imaging studies  CBC: Recent Labs  Lab 12/13/23 1901 12/15/23 0522  WBC 7.0 6.8  NEUTROABS  --  2.7  HGB 16.9* 13.7  HCT 46.3* 41.2  MCV 89.4 95.2  PLT 193 147*    Basic Metabolic Panel: Recent Labs  Lab 12/13/23 1901 12/14/23 0917 12/15/23 0522  NA 143 143 140  K 2.7* 3.0* 3.3*  CL 104 108 109  CO2 23 23 20*  GLUCOSE 142* 91 72  BUN 15 11 7*  CREATININE 0.83 0.76 0.58  CALCIUM  9.0 8.6* 8.5*  MG 2.0  --  2.2    GFR: Estimated Creatinine Clearance: 43.4 mL/min (by C-G formula based on SCr of 0.58 mg/dL).  Liver Function Tests: Recent Labs  Lab 12/13/23 1901  AST 30  ALT 16  ALKPHOS 136*  BILITOT 0.9  PROT 6.6  ALBUMIN 4.4    Recent Labs  Lab 12/13/23 1901  LIPASE 44     Recent Results (from the past 240 hours)  Urine Culture     Status: None   Collection Time: 12/07/23  4:18 PM   Specimen: Urine, Clean Catch  Result Value Ref Range Status   Specimen Description URINE, CLEAN CATCH  Final   Special Requests NONE  Final   Culture   Final    NO GROWTH Performed at  [facility], [address], [place], [place] [address]    Report Status 12/09/2023 FINAL  Final  C Difficile Quick Screen w PCR reflex     Status: Abnormal   Collection Time: 12/14/23  8:10 AM   Specimen: STOOL  Result Value Ref Range Status   C Diff antigen POSITIVE (A) NEGATIVE Final   C Diff toxin NEGATIVE NEGATIVE Final   C Diff interpretation Results are indeterminate. See PCR results.  Final    Comment: Performed at [facility], [address], [place], [place] [address]  C. Diff by PCR, Reflexed     Status: None   Collection Time: 12/14/23  8:10  AM  Result Value Ref Range Status   Toxigenic C. Difficile by PCR NEGATIVE NEGATIVE Final    Comment: Patient is colonized with non toxigenic C. difficile. May not need treatment unless significant symptoms are present.   Hypervirulent Strain PRESUMPTIVE NEGATIVE PRESUMPTIVE NEGATIVE Final    Comment: Performed at [facility], [address], [place], [place] [address]      Radiology Studies: CT ABDOMEN PELVIS W CONTRAST Result Date: 12/13/2023 EXAM: CT ABDOMEN AND PELVIS WITH CONTRAST 12/13/2023 09:47:58 PM TECHNIQUE: CT of the abdomen and pelvis was performed with the administration of intravenous contrast (100mL iohexol  (OMNIPAQUE ) 300 MG/ML solution). Multiplanar reformatted images are provided for review. Automated exposure control, iterative reconstruction, and/or weight-based adjustment of the mA/kV was utilized to reduce the radiation dose to as low as reasonably achievable. COMPARISON: CT 06/20/2023 CLINICAL HISTORY: Abdominal pain, acute, nonlocalized. 78 y/o female reports increased shob x 1 week, subjective fever. Reports emesis today. Diarrhea x 3 weeks, abd pain x 1 week. Surgeries: Total hysterectomy; Appendectomy, multiple spine surgeries. FINDINGS: LOWER CHEST: No acute abnormality. LIVER: The liver is unremarkable. GALLBLADDER AND BILE DUCTS: Gallbladder is unremarkable. No biliary ductal dilatation.  SPLEEN: No acute abnormality. PANCREAS: No acute abnormality. ADRENAL GLANDS: No acute abnormality. KIDNEYS, URETERS AND BLADDER: No stones in the kidneys or ureters. No hydronephrosis. No perinephric or periureteral stranding. Urinary bladder is unremarkable. GI AND BOWEL: Fluid-filled small bowel with possible mild thickening of the wall and valvulae conniventes. Mild wall thickening and mucosal hyperenhancement of the colon versus underdistention. There is no bowel obstruction. PERITONEUM AND RETROPERITONEUM: No ascites. No free air. VASCULATURE: Advanced arterial atherosclerotic calcification including the aorta and its mesenteric and iliac artery branches. Severe narrowing of the SMA at the origin with possible occlusion (series 10 image 99). Correlation for signs and symptoms of chronic mesenteric ischemia is recommended. LYMPH NODES: No lymphadenopathy. REPRODUCTIVE ORGANS: No acute abnormality. BONES AND SOFT TISSUES: Posterior fusion in the thoracolumbar spine. Multiple chronic vertebral body compression fractures. Vertebroplasty of L5. No acute fracture. No focal soft tissue abnormality. IMPRESSION: 1. Findings are suggestive of nonspecific enterocolitis. 2. 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 Recommend correlation for signs and symptoms of chronic mesenteric ischemia and correlation with lactate. Electronically signed by: Norman Gatlin MD 12/13/2023 10:13 PM EDT RP Workstation: [id number]   DG Chest 2 View Result Date: 12/13/2023 CLINICAL DATA:  Short of breath EXAM: CHEST - 2 VIEW COMPARISON:  12/13/2023 FINDINGS: Frontal and lateral views of the chest demonstrate a stable cardiac silhouette. No acute airspace disease, effusion, or pneumothorax. Stable postsurgical changes of the thoracolumbar spine. IMPRESSION: 1. Stable chest, no acute process. Electronically Signed   By: Ozell Daring M.D.   On: 12/13/2023 19:44        LOS: 0 days   Naliah Eddington Verdene  [facility] Pager on [email or web address]  12/15/2023, 9:29 AM

## 2023-12-15 NOTE — Plan of Care (Signed)

## 2023-12-16 ENCOUNTER — Encounter (HOSPITAL_COMMUNITY): Payer: Self-pay | Admitting: Internal Medicine

## 2023-12-16 DIAGNOSIS — K529 Noninfective gastroenteritis and colitis, unspecified: Secondary | ICD-10-CM | POA: Diagnosis not present

## 2023-12-16 LAB — CBC WITH DIFFERENTIAL/PLATELET
Abs Immature Granulocytes: 0.03 K/uL (ref 0.00–0.07)
Basophils Absolute: 0.1 K/uL (ref 0.0–0.1)
Basophils Relative: 1 %
Eosinophils Absolute: 0.4 K/uL (ref 0.0–0.5)
Eosinophils Relative: 8 %
HCT: 40.9 % (ref 36.0–46.0)
Hemoglobin: 13.2 g/dL (ref 12.0–15.0)
Immature Granulocytes: 1 %
Lymphocytes Relative: 47 %
Lymphs Abs: 2.7 K/uL (ref 0.7–4.0)
MCH: 32 pg (ref 26.0–34.0)
MCHC: 32.3 g/dL (ref 30.0–36.0)
MCV: 99.3 fL (ref 80.0–100.0)
Monocytes Absolute: 0.6 K/uL (ref 0.1–1.0)
Monocytes Relative: 10 %
Neutro Abs: 1.8 K/uL (ref 1.7–7.7)
Neutrophils Relative %: 33 %
Platelets: 147 K/uL — ABNORMAL LOW (ref 150–400)
RBC: 4.12 MIL/uL (ref 3.87–5.11)
RDW: 13.6 % (ref 11.5–15.5)
WBC: 5.5 K/uL (ref 4.0–10.5)
nRBC: 0 % (ref 0.0–0.2)

## 2023-12-16 LAB — BASIC METABOLIC PANEL WITH GFR
Anion gap: 8 (ref 5–15)
BUN: 10 mg/dL (ref 8–23)
CO2: 19 mmol/L — ABNORMAL LOW (ref 22–32)
Calcium: 8.7 mg/dL — ABNORMAL LOW (ref 8.9–10.3)
Chloride: 114 mmol/L — ABNORMAL HIGH (ref 98–111)
Creatinine, Ser: 0.69 mg/dL (ref 0.44–1.00)
GFR, Estimated: >60 mL/min (ref >60)
Glucose, Bld: 80 mg/dL (ref 70–99)
Potassium: 5.5 mmol/L — ABNORMAL HIGH (ref 3.5–5.1)
Sodium: 141 mmol/L (ref 135–145)

## 2023-12-16 LAB — POTASSIUM: Potassium: 4.9 mmol/L (ref 3.5–5.1)

## 2023-12-16 LAB — MAGNESIUM: Magnesium: 2.3 mg/dL (ref 1.7–2.4)

## 2023-12-16 MED ORDER — POTASSIUM CHLORIDE CRYS ER 20 MEQ PO TBCR: 20.0000 meq | EXTENDED_RELEASE_TABLET | Freq: Two times a day (BID) | ORAL | Status: DC

## 2023-12-16 MED ORDER — FUROSEMIDE 40 MG PO TABS
80.0000 mg | ORAL_TABLET | Freq: Two times a day (BID) | ORAL | Status: DC
Start: 2023-12-20 — End: 2024-02-01

## 2023-12-16 NOTE — Plan of Care (Signed)

## 2023-12-16 NOTE — Progress Notes (Signed)
 [facility] PROGRESS NOTE   Donna Little FMW:[id number] DOB: 1945-12-09 DOA: 12/13/2023  PCP: Sherre Clapper, MD  Brief History: 78 year old female with PMH chronic hypoxia on 2 L oxygen , emphysema, SVT, depression, GERD, HTN, IBS, PMR, pulmonary hypertension, history of PE, CVA, DM II who presented to [facility] with fatigue, abd pain, and diarrhea. She mention eating what she thinks was bad chicken from [facility] approximately 3 weeks ago.  She states her son had diarrhea for approximately 1 day and then got better but hers continued.  WBC was noted to be normal.  CT scan suggested enterocolitis.  She was hospitalized for further management.  She was recently seen at urgent care and diagnosed with possible bronchitis and prescribed doxycycline  and prednisone .  She had only taken 2 days of doxycycline  prior to admission due to inability to keep the pill down.   Consultants: None  Procedures: None    Subjective/Interval History: Patient mentions that she is feeling much better.  Diarrhea has resolved.  Tolerated her diet.  Feels stronger.  Has been ambulating in the room.  Looking forward to ambulating outside and possibly going home later today.    Assessment/Plan:  Acute enterocolitis Patient presented with diarrhea.  Possibly viral gastroenteritis. C. difficile panel was positive only for antigen.  Toxin and PCR were negative.  Likely colonization. GI pathogen panel is negative. Diarrhea appears to have resolved.  Abdomen remains benign to examination.  Tolerated her diet.  Hopefully home later today.  Hyperkalemia/hypokalemia Patient was noted to have very low potassium levels at admission.  She was aggressively supplemented.  Noted to have elevated potassium level this morning.  Probably iatrogenic.  Will stop her IV fluids.  Recheck her labs later today.  As long as there is improvement she should be able to go home.  Severe narrowing of SMA origin This was incidentally noted on CT  scan of the abdomen.  Patient denies any symptoms of mesenteric ischemia.  No weight loss.  No abdominal pain after eating meals.  Unlikely this is contributing to her current presentation.  This can be addressed further in the outpatient setting.  Ambulatory referral sent to vascular surgery. She is on antiplatelets and statins.  Chronic respiratory failure with hypoxia On 2 L of oxygen  chronically.  Not requiring any oxygen  here in the hospital.  History of SVT Stable.  No evidence of same currently.  History of seizure disorder Continue Keppra   History of CVA No residual deficits.  Stable. Noted to be on Plavix  and statin.  Essential hypertension Occasional high readings noted.  Currently not on any antihypertensives.  Chronic pain syndrome Noted to be on MS Contin .  Reason for this is not entirely clear.  COPD Stable.  No evidence of exacerbation.  DVT Prophylaxis: Lovenox  Code Status: Full code Family Communication: Daughter updated yesterday Disposition Plan: Mobilize.  Recheck potassium level later today.  Hopefully discharge later today.      Medications: Scheduled:  atorvastatin   80 mg Oral Daily   budesonide -glycopyrrolate -formoterol   2 puff Inhalation BID   citalopram   10 mg Oral Daily   clopidogrel   75 mg Oral Daily   enoxaparin  (LOVENOX ) injection  40 mg Subcutaneous Daily   gabapentin   800 mg Oral TID   levETIRAcetam   500 mg Oral BID   morphine   15 mg Oral Q12H   pantoprazole   40 mg Oral Daily   ranolazine   500 mg Oral BID   sodium chloride  flush  3 mL Intravenous Q12H  Continuous:   PRN:acetaminophen  **OR** acetaminophen , hydrALAZINE , ipratropium-albuterol , labetalol , ondansetron  **OR** ondansetron  (ZOFRAN ) IV    Objective:  Vital Signs  Vitals:   12/15/23 1409 12/15/23 2206 12/16/23 0549 12/16/23 0807  BP: (!) 161/86 116/67 112/70   Pulse: 93 66 (!) 59   Resp: 16 17 10    Temp: 98.5 F (36.9 C) 97.7 F (36.5 C)    TempSrc:      SpO2:  97% 96% 95% 97%  Weight:      Height:       Filed Weights   12/13/23 1831  Weight: 55.8 kg    General appearance: Awake alert.  In no distress Resp: Clear to auscultation bilaterally.  Normal effort Cardio: S1-S2 is normal regular.  No S3-S4.  No rubs murmurs or bruit GI: Abdomen is soft.  Nontender nondistended.  Bowel sounds are present normal.  No masses organomegaly Extremities: No edema.  Full range of motion of lower extremities. Neurologic: Alert and oriented x3.  No focal neurological deficits.    Lab Results:  Data Reviewed: I have personally reviewed following labs and reports of the imaging studies  CBC: Recent Labs  Lab 12/13/23 1901 12/15/23 0522 12/16/23 0512  WBC 7.0 6.8 5.5  NEUTROABS  --  2.7 1.8  HGB 16.9* 13.7 13.2  HCT 46.3* 41.2 40.9  MCV 89.4 95.2 99.3  PLT 193 147* 147*    Basic Metabolic Panel: Recent Labs  Lab 12/13/23 1901 12/14/23 0917 12/15/23 0522 12/16/23 0512  NA 143 143 140 141  K 2.7* 3.0* 3.3* 5.5*  CL 104 108 109 114*  CO2 23 23 20* 19*  GLUCOSE 142* 91 72 80  BUN 15 11 7* 10  CREATININE 0.83 0.76 0.58 0.69  CALCIUM  9.0 8.6* 8.5* 8.7*  MG 2.0  --  2.2 2.3    GFR: Estimated Creatinine Clearance: 43.4 mL/min (by C-G formula based on SCr of 0.69 mg/dL).  Liver Function Tests: Recent Labs  Lab 12/13/23 1901  AST 30  ALT 16  ALKPHOS 136*  BILITOT 0.9  PROT 6.6  ALBUMIN 4.4    Recent Labs  Lab 12/13/23 1901  LIPASE 44     Recent Results (from the past 240 hours)  Urine Culture     Status: None   Collection Time: 12/07/23  4:18 PM   Specimen: Urine, Clean Catch  Result Value Ref Range Status   Specimen Description URINE, CLEAN CATCH  Final   Special Requests NONE  Final   Culture   Final    NO GROWTH Performed at [facility], [address], [place], [place] [address]    Report Status 12/09/2023 FINAL  Final  C Difficile Quick Screen w PCR reflex     Status: Abnormal   Collection Time: 12/14/23   8:10 AM   Specimen: STOOL  Result Value Ref Range Status   C Diff antigen POSITIVE (A) NEGATIVE Final   C Diff toxin NEGATIVE NEGATIVE Final   C Diff interpretation Results are indeterminate. See PCR results.  Final    Comment: Performed at [facility], [address] [place], [place] [address]  Gastrointestinal Panel by PCR , Stool     Status: None   Collection Time: 12/14/23  8:10 AM   Specimen: STOOL  Result Value Ref Range Status   Campylobacter species NOT DETECTED NOT DETECTED Final   Plesimonas shigelloides NOT DETECTED NOT DETECTED Final   Salmonella species NOT DETECTED NOT DETECTED Final   Yersinia enterocolitica  NOT DETECTED NOT DETECTED Final   Vibrio species NOT DETECTED NOT DETECTED Final   Vibrio cholerae NOT DETECTED NOT DETECTED Final   Enteroaggregative E coli (EAEC) NOT DETECTED NOT DETECTED Final   Enteropathogenic E coli (EPEC) NOT DETECTED NOT DETECTED Final   Enterotoxigenic E coli (ETEC) NOT DETECTED NOT DETECTED Final   Shiga like toxin producing E coli (STEC) NOT DETECTED NOT DETECTED Final   Shigella/Enteroinvasive E coli (EIEC) NOT DETECTED NOT DETECTED Final   Cryptosporidium NOT DETECTED NOT DETECTED Final   Cyclospora cayetanensis NOT DETECTED NOT DETECTED Final   Entamoeba histolytica NOT DETECTED NOT DETECTED Final   Giardia lamblia NOT DETECTED NOT DETECTED Final   Adenovirus F40/41 NOT DETECTED NOT DETECTED Final   Astrovirus NOT DETECTED NOT DETECTED Final   Norovirus GI/GII NOT DETECTED NOT DETECTED Final   Rotavirus A NOT DETECTED NOT DETECTED Final   Sapovirus (I, II, IV, and V) NOT DETECTED NOT DETECTED Final    Comment: Performed at [facility], [address] [place], [place] [address]  C. Diff by PCR, Reflexed     Status: None   Collection Time: 12/14/23  8:10 AM  Result Value Ref Range Status   Toxigenic C. Difficile by PCR NEGATIVE NEGATIVE Final    Comment: Patient is colonized with non toxigenic C. difficile. May  not need treatment unless significant symptoms are present.   Hypervirulent Strain PRESUMPTIVE NEGATIVE PRESUMPTIVE NEGATIVE Final    Comment: Performed at [facility], [address], [place], [place] [address]      Radiology Studies: No results found.      LOS: 1 day   Estiven Kohan  [facility] Pager on [email or web address]  12/16/2023, 9:51 AM

## 2023-12-16 NOTE — Progress Notes (Signed)
 Discharge instructions given to patient questions asked and answered.

## 2023-12-16 NOTE — Discharge Summary (Signed)
 [facility]  Physician Discharge Summary   Patient ID: Donna Little MRN: [id number] DOB/AGE: 07-03-1945 78 y.o.  Admit date: 12/13/2023 Discharge date: 12/16/2023    PCP: Sherre Clapper, MD  DISCHARGE DIAGNOSES:    Enterocolitis   Hypokalemia   Chronic hypoxic respiratory failure (HCC)   Hyperlipidemia   COPD mixed type (HCC)   HTN (hypertension)   History of CVA (cerebrovascular accident)   History of seizure   History of supraventricular tachycardia   RECOMMENDATIONS FOR OUTPATIENT FOLLOW UP: Ambulatory referral sent to vascular surgery   Home Health: None Equipment/Devices: None  CODE STATUS: Full code  DISCHARGE CONDITION: fair  Diet recommendation: As before  INITIAL HISTORY: 78 year old female with PMH chronic hypoxia on 2 L oxygen , emphysema, SVT, depression, GERD, HTN, IBS, PMR, pulmonary hypertension, history of PE, CVA, DM II who presented to [facility] with fatigue, abd pain, and diarrhea. She mention eating what she thinks was bad chicken from [facility] approximately 3 weeks ago.  She states her son had diarrhea for approximately 1 day and then got better but hers continued.  WBC was noted to be normal.  CT scan suggested enterocolitis.  She was hospitalized for further management.  She was recently seen at urgent care and diagnosed with possible bronchitis and prescribed doxycycline  and prednisone .  She had only taken 2 days of doxycycline  prior to admission due to inability to keep the pill down.  HOSPITAL COURSE:   Acute enterocolitis Patient presented with diarrhea.  Possibly viral gastroenteritis. C. difficile panel was positive only for antigen.  Toxin and PCR were negative.  Likely colonization. GI pathogen panel is negative. Diarrhea appears to have resolved.  Abdomen remains benign to examination.  Tolerated her diet.  Okay for discharge home today.   Hyperkalemia/hypokalemia Patient was noted to have very low potassium levels at admission.  She was  aggressively supplemented.  Noted to have elevated potassium level this morning.  Probably iatrogenic.  This was rechecked and noted to be normal.    Severe narrowing of SMA origin This was incidentally noted on CT scan of the abdomen.  Patient denies any symptoms of mesenteric ischemia.  No weight loss.  No abdominal pain after eating meals.  Unlikely this is contributing to her current presentation.  This can be addressed further in the outpatient setting.  Ambulatory referral sent to vascular surgery. She is on antiplatelets and statins.   Chronic respiratory failure with hypoxia On 2 L of oxygen  chronically.  Not requiring any oxygen  here in the hospital.   History of SVT Stable.  No evidence of same currently.   History of seizure disorder Continue Keppra    History of CVA No residual deficits.  Stable. Noted to be on Plavix  and statin.   Essential hypertension Occasional high readings noted.  Currently not on any antihypertensives.   Chronic pain syndrome Noted to be on MS Contin .  Reason for this is not entirely clear.   COPD Stable.  No evidence of exacerbation.   Patient is stable.  Okay for discharge home.  PERTINENT LABS:  The results of significant diagnostics from this hospitalization (including imaging, microbiology, ancillary and laboratory) are listed below for reference.    Microbiology: Recent Results (from the past 240 hours)  Urine Culture     Status: None   Collection Time: 12/07/23  4:18 PM   Specimen: Urine, Clean Catch  Result Value Ref Range Status   Specimen Description URINE, CLEAN CATCH  Final   Special Requests NONE  Final   Culture   Final    NO GROWTH Performed at [facility], [address], [place], [place] [address]    Report Status 12/09/2023 FINAL  Final  C Difficile Quick Screen w PCR reflex     Status: Abnormal   Collection Time: 12/14/23  8:10 AM   Specimen: STOOL  Result Value Ref Range Status   C Diff antigen POSITIVE  (A) NEGATIVE Final   C Diff toxin NEGATIVE NEGATIVE Final   C Diff interpretation Results are indeterminate. See PCR results.  Final    Comment: Performed at [facility], [address] [place], [place] [address]  Gastrointestinal Panel by PCR , Stool     Status: None   Collection Time: 12/14/23  8:10 AM   Specimen: STOOL  Result Value Ref Range Status   Campylobacter species NOT DETECTED NOT DETECTED Final   Plesimonas shigelloides NOT DETECTED NOT DETECTED Final   Salmonella species NOT DETECTED NOT DETECTED Final   Yersinia enterocolitica NOT DETECTED NOT DETECTED Final   Vibrio species NOT DETECTED NOT DETECTED Final   Vibrio cholerae NOT DETECTED NOT DETECTED Final   Enteroaggregative E coli (EAEC) NOT DETECTED NOT DETECTED Final   Enteropathogenic E coli (EPEC) NOT DETECTED NOT DETECTED Final   Enterotoxigenic E coli (ETEC) NOT DETECTED NOT DETECTED Final   Shiga like toxin producing E coli (STEC) NOT DETECTED NOT DETECTED Final   Shigella/Enteroinvasive E coli (EIEC) NOT DETECTED NOT DETECTED Final   Cryptosporidium NOT DETECTED NOT DETECTED Final   Cyclospora cayetanensis NOT DETECTED NOT DETECTED Final   Entamoeba histolytica NOT DETECTED NOT DETECTED Final   Giardia lamblia NOT DETECTED NOT DETECTED Final   Adenovirus F40/41 NOT DETECTED NOT DETECTED Final   Astrovirus NOT DETECTED NOT DETECTED Final   Norovirus GI/GII NOT DETECTED NOT DETECTED Final   Rotavirus A NOT DETECTED NOT DETECTED Final   Sapovirus (I, II, IV, and V) NOT DETECTED NOT DETECTED Final    Comment: Performed at [facility], [address] [place], [place] [address]  C. Diff by PCR, Reflexed     Status: None   Collection Time: 12/14/23  8:10 AM  Result Value Ref Range Status   Toxigenic C. Difficile by PCR NEGATIVE NEGATIVE Final    Comment: Patient is colonized with non toxigenic C. difficile. May not need treatment unless significant symptoms are present.   Hypervirulent Strain  PRESUMPTIVE NEGATIVE PRESUMPTIVE NEGATIVE Final    Comment: Performed at [facility], [address] [place], [place] [address]     Labs:   Basic Metabolic Panel: Recent Labs  Lab 12/13/23 1901 12/14/23 0917 12/15/23 0522 12/16/23 0512 12/16/23 1144  NA 143 143 140 141  --   K 2.7* 3.0* 3.3* 5.5* 4.9  CL 104 108 109 114*  --   CO2 23 23 20* 19*  --   GLUCOSE 142* 91 72 80  --   BUN 15 11 7* 10  --   CREATININE 0.83 0.76 0.58 0.69  --   CALCIUM  9.0 8.6* 8.5* 8.7*  --   MG 2.0  --  2.2 2.3  --    Liver Function Tests: Recent Labs  Lab 12/13/23 1901  AST 30  ALT 16  ALKPHOS 136*  BILITOT 0.9  PROT 6.6  ALBUMIN 4.4   Recent Labs  Lab 12/13/23 1901  LIPASE 44   CBC: Recent Labs  Lab 12/13/23 1901 12/15/23 0522 12/16/23 0512  WBC  7.0 6.8 5.5  NEUTROABS  --  2.7 1.8  HGB 16.9* 13.7 13.2  HCT 46.3* 41.2 40.9  MCV 89.4 95.2 99.3  PLT 193 147* 147*    IMAGING STUDIES CT ABDOMEN PELVIS W CONTRAST Result Date: 12/13/2023 EXAM: CT ABDOMEN AND PELVIS WITH CONTRAST 12/13/2023 09:47:58 PM TECHNIQUE: CT of the abdomen and pelvis was performed with the administration of intravenous contrast (100mL iohexol  (OMNIPAQUE ) 300 MG/ML solution). Multiplanar reformatted images are provided for review. Automated exposure control, iterative reconstruction, and/or weight-based adjustment of the mA/kV was utilized to reduce the radiation dose to as low as reasonably achievable. COMPARISON: CT 06/20/2023 CLINICAL HISTORY: Abdominal pain, acute, nonlocalized. 78 y/o female reports increased shob x 1 week, subjective fever. Reports emesis today. Diarrhea x 3 weeks, abd pain x 1 week. Surgeries: Total hysterectomy; Appendectomy, multiple spine surgeries. FINDINGS: LOWER CHEST: No acute abnormality. LIVER: The liver is unremarkable. GALLBLADDER AND BILE DUCTS: Gallbladder is unremarkable. No biliary ductal dilatation. SPLEEN: No acute abnormality. PANCREAS: No acute abnormality. ADRENAL  GLANDS: No acute abnormality. KIDNEYS, URETERS AND BLADDER: No stones in the kidneys or ureters. No hydronephrosis. No perinephric or periureteral stranding. Urinary bladder is unremarkable. GI AND BOWEL: Fluid-filled small bowel with possible mild thickening of the wall and valvulae conniventes. Mild wall thickening and mucosal hyperenhancement of the colon versus underdistention. There is no bowel obstruction. PERITONEUM AND RETROPERITONEUM: No ascites. No free air. VASCULATURE: Advanced arterial atherosclerotic calcification including the aorta and its mesenteric and iliac artery branches. Severe narrowing of the SMA at the origin with possible occlusion (series 10 image 99). Correlation for signs and symptoms of chronic mesenteric ischemia is recommended. LYMPH NODES: No lymphadenopathy. REPRODUCTIVE ORGANS: No acute abnormality. BONES AND SOFT TISSUES: Posterior fusion in the thoracolumbar spine. Multiple chronic vertebral body compression fractures. Vertebroplasty of L5. No acute fracture. No focal soft tissue abnormality. IMPRESSION: 1. Findings are suggestive of nonspecific enterocolitis. 2. 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 Recommend correlation for signs and symptoms of chronic mesenteric ischemia and correlation with lactate. Electronically signed by: Norman Gatlin MD 12/13/2023 10:13 PM EDT RP Workstation: [id number]   DG Chest 2 View Result Date: 12/13/2023 CLINICAL DATA:  Short of breath EXAM: CHEST - 2 VIEW COMPARISON:  12/13/2023 FINDINGS: Frontal and lateral views of the chest demonstrate a stable cardiac silhouette. No acute airspace disease, effusion, or pneumothorax. Stable postsurgical changes of the thoracolumbar spine. IMPRESSION: 1. Stable chest, no acute process. Electronically Signed   By: Ozell Daring M.D.   On: 12/13/2023 19:44   DG Chest 2 View Result Date: 12/07/2023 CLINICAL DATA:  Cough and shortness  of breath EXAM: CHEST - 2 VIEW COMPARISON:  July 29, 2023 FINDINGS: The heart size and mediastinal contours are within normal limits. Aortic knob is prominent and calcified. No new consolidation. Mild increased interstitial markings of lung fields, similar to prior. Both lungs are otherwise clear. Posterior spinal fusion device. No new osseous lesion. Vascular stent superimposing central mediastinum. Surgical clips in right lower neck. No pleural effusion or pneumothorax. IMPRESSION: No active cardiopulmonary disease. Electronically Signed   By: Megan  Zare M.D.   On: 12/07/2023 18:40    DISCHARGE EXAMINATION: See progress note from earlier today  DISPOSITION: Home  Discharge Instructions     Ambulatory referral to Vascular Surgery   Complete by: As directed    SMA narrowing   Call MD for:  difficulty breathing, headache or visual disturbances   Complete  by: As directed    Call MD for:  extreme fatigue   Complete by: As directed    Call MD for:  persistant dizziness or light-headedness   Complete by: As directed    Call MD for:  persistant nausea and vomiting   Complete by: As directed    Call MD for:  severe uncontrolled pain   Complete by: As directed    Call MD for:  temperature >100.4   Complete by: As directed    Diet - low sodium heart healthy   Complete by: As directed    Discharge instructions   Complete by: As directed    Please take your medications as prescribed.  Resume your furosemide /Lasix  and your potassium tablets from 12/20/2023.  Follow-up with your primary care provider by end of next week.  You were cared for by a hospitalist during your hospital stay. If you have any questions about your discharge medications or the care you received while you were in the hospital after you are discharged, you can call the unit and asked to speak with the hospitalist on call if the hospitalist that took care of you is not available. Once you are discharged, your primary care  physician will handle any further medical issues. Please note that NO REFILLS for any discharge medications will be authorized once you are discharged, as it is imperative that you return to your primary care physician (or establish a relationship with a primary care physician if you do not have one) for your aftercare needs so that they can reassess your need for medications and monitor your lab values. If you do not have a primary care physician, you can call [phone] for a physician referral.   Increase activity slowly   Complete by: As directed          Allergies as of 12/16/2023       Reactions   Codeine Other (See Comments)   It made me crazy   Macrobid  [nitrofurantoin ] Nausea And Vomiting   Methocarbamol Other (See Comments)   Sleepy   Sertraline Other (See Comments)   Insomnia   Tizanidine Other (See Comments)   Sleepy        Medication List     STOP taking these medications    doxycycline  100 MG capsule Commonly known as: VIBRAMYCIN    meclizine  25 MG tablet Commonly known as: ANTIVERT    naloxone  4 MG/0.1ML Liqd nasal spray kit Commonly known as: NARCAN        TAKE these medications    Acetaminophen  500 MG capsule Take 1,000 mg by mouth every 6 (six) hours as needed for pain (or headaches).   albuterol  108 (90 Base) MCG/ACT inhaler Commonly known as: VENTOLIN  HFA Inhale 2 puffs into the lungs every 6 (six) hours as needed. What changed: reasons to take this   amLODipine  2.5 MG tablet Commonly known as: NORVASC  Take 1 tablet (2.5 mg total) by mouth daily.   atorvastatin  80 MG tablet Commonly known as: LIPITOR Take 1 tablet (80 mg total) by mouth daily. What changed: when to take this   citalopram  10 MG tablet Commonly known as: CELEXA  Take 1 tablet (10 mg total) by mouth daily.   clopidogrel  75 MG tablet Commonly known as: PLAVIX  Take 1 tablet by mouth once daily   dicyclomine  20 MG tablet Commonly known as: BENTYL  Take 1 tablet (20  mg total) by mouth 4 (four) times daily -  before meals and at bedtime.   diltiazem  120 MG 24  hr capsule Commonly known as: CARDIZEM  CD Take 1 capsule (120 mg total) by mouth daily. What changed: when to take this   furosemide  40 MG tablet Commonly known as: LASIX  Take 2 tablets (80 mg total) by mouth 2 (two) times daily. Resume on 12/20/2023 Start taking on: December 20, 2023 What changed:  additional instructions These instructions start on December 20, 2023. If you are unsure what to do until then, ask your doctor or other care provider.   gabapentin  800 MG tablet Commonly known as: NEURONTIN  Take 1 tablet (800 mg total) by mouth 3 (three) times daily.   ipratropium-albuterol  0.5-2.5 (3) MG/3ML Soln Commonly known as: DUONEB Take 3 mLs by nebulization every 6 (six) hours as needed. What changed: reasons to take this   levETIRAcetam  500 MG tablet Commonly known as: KEPPRA  Take 1 tablet (500 mg total) by mouth 2 (two) times daily.   morphine  15 MG 12 hr tablet Commonly known as: MS CONTIN  Take 1 tablet (15 mg total) by mouth every 12 (twelve) hours.   nitroGLYCERIN 0.4 MG SL tablet Commonly known as: NITROSTAT Place under the tongue. What changed:  how much to take when to take this reasons to take this   pantoprazole  40 MG tablet Commonly known as: PROTONIX  Take 1 tablet (40 mg total) by mouth daily. What changed: when to take this   potassium chloride  SA 20 MEQ tablet Commonly known as: KLOR-CON  M Take 1 tablet (20 mEq total) by mouth 2 (two) times daily with a meal. Resume on 12/20/2023 Start taking on: December 20, 2023 What changed:  additional instructions These instructions start on December 20, 2023. If you are unsure what to do until then, ask your doctor or other care provider.   promethazine -dextromethorphan 6.25-15 MG/5ML syrup Commonly known as: PROMETHAZINE -DM Take 5 mLs by mouth 4 (four) times daily as needed for cough. Do not use and drive -  May make drowsy.   ranolazine  500 MG 12 hr tablet Commonly known as: RANEXA  Take 1 tablet (500 mg total) by mouth 2 (two) times daily.   Trelegy Ellipta  100-62.5-25 MCG/ACT Aepb Generic drug: Fluticasone-Umeclidin-Vilant Inhale into the lungs.          Follow-up Information     Cox, Kirsten, MD. Schedule an appointment as soon as possible for a visit in 1 week(s).   Specialty: Family Medicine Why: post hospitalization follow up Contact information: [address] [place] [address] [phone]                 TOTAL DISCHARGE TIME: 35 minutes  Osha Errico Verdene  [facility] Pager on [email or web address]  12/17/2023, 10:39 AM

## 2023-12-16 NOTE — TOC Transition Note (Signed)
 Transition of Care [facility]) - Discharge Note   Patient Details  Name: Donna Little MRN: [id number] Date of Birth: 1945/05/01  Transition [facility] CM/SW Contact:  Sonda Manuella Quill, RN Phone Number: 12/16/2023, 4:41 PM   Clinical Narrative:    D/C orders received; no TOC needs.   Final next level of care: Home/Self Care Barriers to Discharge: No Barriers Identified   Patient Goals and CMS Choice Patient states their goals for this hospitalization and ongoing recovery are:: Return home CMS Medicare.gov Compare Post Acute Care list provided to:: Other (Comment Required) (NA) Choice offered to / list presented to : NA [place] ownership interest in [facility].provided to:: Parent NA    Discharge Placement                       Discharge Plan and Services Additional resources added to the After Visit Summary for   In-house Referral: NA Discharge Planning Services: CM Consult Post Acute Care Choice: Durable Medical Equipment          DME Arranged: N/A DME Agency: NA       HH Arranged: NA HH Agency: NA        Social Drivers of Health (SDOH) Interventions SDOH Screenings   Food Insecurity: No Food Insecurity (12/15/2023)  Housing: Low Risk  (12/15/2023)  Transportation Needs: No Transportation Needs (12/15/2023)  Utilities: Not At Risk (12/15/2023)  Alcohol Screen: Low Risk  (06/08/2023)  Depression (PHQ2-9): High Risk (11/17/2023)  Financial Resource Strain: Low Risk  (10/11/2023)   Received from [facility]  Physical Activity: Inactive (06/08/2023)  Social Connections: Moderately Isolated (12/15/2023)  Stress: No Stress Concern Present (06/08/2023)  Tobacco Use: High Risk (12/16/2023)  Health Literacy: Adequate Health Literacy (06/08/2023)     Readmission Risk Interventions     No data to display

## 2023-12-17 ENCOUNTER — Ambulatory Visit: Admitting: Family Medicine

## 2023-12-17 ENCOUNTER — Telehealth: Payer: Self-pay

## 2023-12-17 NOTE — Transitions of Care (Post Inpatient/ED Visit) (Signed)
   12/17/2023  Name: Donna Little MRN: [id number] DOB: Jul 17, 1945  Today's TOC FU Call Status: Today's TOC FU Call Status:: Unsuccessful Call (1st Attempt) Unsuccessful Call (1st Attempt) Date: 12/17/23  Attempted to reach the patient regarding the most recent Inpatient/ED visit.  Follow Up Plan: Additional outreach attempts will be made to reach the patient to complete the Transitions of Care (Post Inpatient/ED visit) call.   [name] RN, CCM [place]  VBCI-Population Health RN Care Manager [phone]

## 2023-12-20 ENCOUNTER — Telehealth: Payer: Self-pay

## 2023-12-20 ENCOUNTER — Ambulatory Visit: Admitting: Family Medicine

## 2023-12-20 NOTE — Transitions of Care (Post Inpatient/ED Visit) (Signed)
   12/20/2023  Name: Donna Little MRN: [id number] DOB: [birth year]  Today's TOC FU Call Status: Today's TOC FU Call Status:: Unsuccessful Call (2nd Attempt) Unsuccessful Call (2nd Attempt) Date: 12/20/23  Attempted to reach the patient regarding the most recent Inpatient/ED visit.  Follow Up Plan: Additional outreach attempts will be made to reach the patient to complete the Transitions of Care (Post Inpatient/ED visit) call.   [name] RN, CCM [place]  VBCI-Population Health RN Care Manager [phone]

## 2023-12-21 ENCOUNTER — Telehealth: Payer: Self-pay

## 2023-12-21 NOTE — Transitions of Care (Post Inpatient/ED Visit) (Signed)
   12/21/2023  Name: Donna Little MRN: [id number] DOB: 08-17-1945  Today's TOC FU Call Status: Today's TOC FU Call Status:: Unsuccessful Call (3rd Attempt) Unsuccessful Call (3rd Attempt) Date: 12/21/23  Attempted to reach the patient regarding the most recent Inpatient/ED visit.  Follow Up Plan: No further outreach attempts will be made at this time. We have been unable to contact the patient.  [name] RN, CCM [place]  VBCI-Population Health RN Care Manager [phone]

## 2023-12-22 DIAGNOSIS — R7989 Other specified abnormal findings of blood chemistry: Secondary | ICD-10-CM | POA: Diagnosis not present

## 2023-12-22 DIAGNOSIS — M0600 Rheumatoid arthritis without rheumatoid factor, unspecified site: Secondary | ICD-10-CM | POA: Diagnosis not present

## 2023-12-24 ENCOUNTER — Other Ambulatory Visit: Payer: Self-pay

## 2023-12-24 DIAGNOSIS — K551 Chronic vascular disorders of intestine: Secondary | ICD-10-CM

## 2023-12-24 DIAGNOSIS — I700 Atherosclerosis of aorta: Secondary | ICD-10-CM

## 2023-12-27 ENCOUNTER — Other Ambulatory Visit: Payer: Self-pay | Admitting: Family Medicine

## 2023-12-27 ENCOUNTER — Telehealth: Payer: Self-pay | Admitting: Family Medicine

## 2023-12-27 DIAGNOSIS — G8929 Other chronic pain: Secondary | ICD-10-CM

## 2023-12-27 MED ORDER — MORPHINE SULFATE ER 15 MG PO TBCR
15.0000 mg | EXTENDED_RELEASE_TABLET | Freq: Two times a day (BID) | ORAL | 0 refills | Status: DC
Start: 2023-12-27 — End: 2024-01-27

## 2023-12-27 MED ORDER — LEVETIRACETAM 500 MG PO TABS: 500.0000 mg | ORAL_TABLET | Freq: Two times a day (BID) | ORAL | 3 refills | Status: DC

## 2023-12-27 NOTE — Telephone Encounter (Signed)
 [facility] - OXYGEN  THERAPY

## 2023-12-27 NOTE — Telephone Encounter (Signed)
 Copied from CRM [id number]. Topic: Clinical - Medication Refill >> Dec 27, 2023  9:50 AM Burnard DEL wrote: Medication: morphine  (MS CONTIN ) 15 MG 12 hr tablet   Has the patient contacted their pharmacy? No (Agent: If no, request that the patient contact the pharmacy for the refill. If patient does not wish to contact the pharmacy document the reason why and proceed with request.) (Agent: If yes, when and what did the pharmacy advise?)  This is the patient's preferred pharmacy:  [facility] [phone] - [place], [place] - [address] AT [facility] [address]  Phone: [phone] Fax: [phone]     Is this the correct pharmacy for this prescription? Yes If no, delete pharmacy and type the correct one.   Has the prescription been filled recently? No  Is the patient out of the medication? Yes  Has the patient been seen for an appointment in the last year OR does the patient have an upcoming appointment? Yes  Can we respond through MyChart? Yes  Agent: Please be advised that Rx refills may take up to 3 business days. We ask that you follow-up with your pharmacy.

## 2023-12-27 NOTE — Telephone Encounter (Signed)
 Copied from CRM [id number]. Topic: Clinical - Medication Refill >> Dec 27, 2023  9:52 AM Burnard DEL wrote: Medication: levETIRAcetam  (KEPPRA ) 500 MG tablet  Has the patient contacted their pharmacy? No (Agent: If no, request that the patient contact the pharmacy for the refill. If patient does not wish to contact the pharmacy document the reason why and proceed with request.) (Agent: If yes, when and what did the pharmacy advise?)  This is the patient's preferred pharmacy:  [facility] [address] [place] - [address]  Phone: [phone] Fax: [phone]     Is this the correct pharmacy for this prescription? Yes If no, delete pharmacy and type the correct one.   Has the prescription been filled recently? No  Is the patient out of the medication? Yes  Has the patient been seen for an appointment in the last year OR does the patient have an upcoming appointment? Yes  Can we respond through MyChart? Yes  Agent: Please be advised that Rx refills may take up to 3 business days. We ask that you follow-up with your pharmacy.

## 2024-01-17 DIAGNOSIS — R0600 Dyspnea, unspecified: Secondary | ICD-10-CM | POA: Diagnosis not present

## 2024-01-17 DIAGNOSIS — I10 Essential (primary) hypertension: Secondary | ICD-10-CM | POA: Diagnosis not present

## 2024-01-17 DIAGNOSIS — I471 Supraventricular tachycardia, unspecified: Secondary | ICD-10-CM | POA: Diagnosis not present

## 2024-01-17 DIAGNOSIS — Z87891 Personal history of nicotine dependence: Secondary | ICD-10-CM | POA: Diagnosis not present

## 2024-01-19 DIAGNOSIS — M0600 Rheumatoid arthritis without rheumatoid factor, unspecified site: Secondary | ICD-10-CM | POA: Diagnosis not present

## 2024-01-26 ENCOUNTER — Other Ambulatory Visit: Payer: Self-pay

## 2024-01-26 DIAGNOSIS — I679 Cerebrovascular disease, unspecified: Secondary | ICD-10-CM

## 2024-01-26 DIAGNOSIS — K219 Gastro-esophageal reflux disease without esophagitis: Secondary | ICD-10-CM

## 2024-01-26 DIAGNOSIS — G8929 Other chronic pain: Secondary | ICD-10-CM

## 2024-01-26 DIAGNOSIS — F331 Major depressive disorder, recurrent, moderate: Secondary | ICD-10-CM

## 2024-01-26 MED ORDER — CITALOPRAM HYDROBROMIDE 10 MG PO TABS: 10.0000 mg | ORAL_TABLET | Freq: Every day | ORAL | 3 refills | Status: AC

## 2024-01-26 MED ORDER — LEVETIRACETAM 500 MG PO TABS: 500.0000 mg | ORAL_TABLET | Freq: Two times a day (BID) | ORAL | 3 refills | Status: AC

## 2024-01-26 MED ORDER — PANTOPRAZOLE SODIUM 40 MG PO TBEC: 40.0000 mg | DELAYED_RELEASE_TABLET | Freq: Every day | ORAL | 3 refills | Status: AC

## 2024-01-26 MED ORDER — POTASSIUM CHLORIDE CRYS ER 20 MEQ PO TBCR: 20.0000 meq | EXTENDED_RELEASE_TABLET | Freq: Two times a day (BID) | ORAL | 1 refills | Status: AC

## 2024-01-26 MED ORDER — GABAPENTIN 800 MG PO TABS: 800.0000 mg | ORAL_TABLET | Freq: Three times a day (TID) | ORAL | 1 refills | Status: AC

## 2024-01-27 ENCOUNTER — Other Ambulatory Visit: Payer: Self-pay | Admitting: Family Medicine

## 2024-01-27 DIAGNOSIS — G8929 Other chronic pain: Secondary | ICD-10-CM

## 2024-01-27 MED ORDER — MORPHINE SULFATE ER 15 MG PO TBCR: 15.0000 mg | EXTENDED_RELEASE_TABLET | Freq: Two times a day (BID) | ORAL | 0 refills | Status: DC

## 2024-02-01 ENCOUNTER — Other Ambulatory Visit: Payer: Self-pay | Admitting: Family Medicine

## 2024-02-01 DIAGNOSIS — R079 Chest pain, unspecified: Secondary | ICD-10-CM | POA: Diagnosis not present

## 2024-02-01 DIAGNOSIS — R0600 Dyspnea, unspecified: Secondary | ICD-10-CM | POA: Diagnosis not present

## 2024-02-01 DIAGNOSIS — I10 Essential (primary) hypertension: Secondary | ICD-10-CM | POA: Diagnosis not present

## 2024-02-01 DIAGNOSIS — I2510 Atherosclerotic heart disease of native coronary artery without angina pectoris: Secondary | ICD-10-CM | POA: Diagnosis not present

## 2024-02-01 DIAGNOSIS — R9439 Abnormal result of other cardiovascular function study: Secondary | ICD-10-CM | POA: Diagnosis not present

## 2024-02-01 DIAGNOSIS — Z79899 Other long term (current) drug therapy: Secondary | ICD-10-CM | POA: Diagnosis not present

## 2024-02-01 DIAGNOSIS — I471 Supraventricular tachycardia, unspecified: Secondary | ICD-10-CM | POA: Diagnosis not present

## 2024-02-01 DIAGNOSIS — Z87891 Personal history of nicotine dependence: Secondary | ICD-10-CM | POA: Diagnosis not present

## 2024-02-01 DIAGNOSIS — I110 Hypertensive heart disease with heart failure: Secondary | ICD-10-CM

## 2024-02-02 ENCOUNTER — Encounter: Admitting: Vascular Surgery

## 2024-02-02 ENCOUNTER — Other Ambulatory Visit: Payer: Self-pay

## 2024-02-02 ENCOUNTER — Encounter (HOSPITAL_COMMUNITY)

## 2024-02-02 DIAGNOSIS — M810 Age-related osteoporosis without current pathological fracture: Secondary | ICD-10-CM

## 2024-02-02 DIAGNOSIS — R0600 Dyspnea, unspecified: Secondary | ICD-10-CM | POA: Diagnosis not present

## 2024-02-02 DIAGNOSIS — I2510 Atherosclerotic heart disease of native coronary artery without angina pectoris: Secondary | ICD-10-CM | POA: Diagnosis not present

## 2024-02-02 DIAGNOSIS — Z87891 Personal history of nicotine dependence: Secondary | ICD-10-CM | POA: Diagnosis not present

## 2024-02-02 DIAGNOSIS — R9439 Abnormal result of other cardiovascular function study: Secondary | ICD-10-CM | POA: Diagnosis not present

## 2024-02-02 DIAGNOSIS — E785 Hyperlipidemia, unspecified: Secondary | ICD-10-CM | POA: Diagnosis not present

## 2024-02-02 DIAGNOSIS — I471 Supraventricular tachycardia, unspecified: Secondary | ICD-10-CM | POA: Diagnosis not present

## 2024-02-02 DIAGNOSIS — R079 Chest pain, unspecified: Secondary | ICD-10-CM | POA: Diagnosis not present

## 2024-02-02 MED ORDER — DENOSUMAB 60 MG/ML ~~LOC~~ SOSY
60.0000 mg | PREFILLED_SYRINGE | Freq: Once | SUBCUTANEOUS | Status: AC
  Administered 2024-02-23: 60 mg via SUBCUTANEOUS

## 2024-02-10 ENCOUNTER — Other Ambulatory Visit: Payer: Self-pay | Admitting: Family Medicine

## 2024-02-10 MED ORDER — RANOLAZINE ER 500 MG PO TB12: 500.0000 mg | ORAL_TABLET | Freq: Two times a day (BID) | ORAL | 1 refills | Status: AC

## 2024-02-10 NOTE — Telephone Encounter (Signed)
 Copied from CRM [id number]. Topic: Clinical - Medication Refill >> Feb 10, 2024  8:25 AM [name] wrote: Medication:  ranolazine  (RANEXA ) 500 MG 12 hr tablet  Has the patient contacted their pharmacy? Yes (Agent: If no, request that the patient contact the pharmacy for the refill. If patient does not wish to contact the pharmacy document the reason why and proceed with request.) (Agent: If yes, when and what did the pharmacy advise?)  This is the patient's preferred pharmacy:   [facility] [address], [place] - [address] [place] [address] Phone: [phone] Fax: [phone]  Is this the correct pharmacy for this prescription? Yes If no, delete pharmacy and type the correct one.   Has the prescription been filled recently? Yes  Is the patient out of the medication? Yes  Has the patient been seen for an appointment in the last year OR does the patient have an upcoming appointment? Yes  Can we respond through MyChart? Yes  Agent: Please be advised that Rx refills may take up to 3 business days. We ask that you follow-up with your pharmacy.

## 2024-02-16 DIAGNOSIS — M797 Fibromyalgia: Secondary | ICD-10-CM | POA: Diagnosis not present

## 2024-02-16 DIAGNOSIS — M0600 Rheumatoid arthritis without rheumatoid factor, unspecified site: Secondary | ICD-10-CM | POA: Diagnosis not present

## 2024-02-16 DIAGNOSIS — Z6821 Body mass index (BMI) 21.0-21.9, adult: Secondary | ICD-10-CM | POA: Diagnosis not present

## 2024-02-16 DIAGNOSIS — M1991 Primary osteoarthritis, unspecified site: Secondary | ICD-10-CM | POA: Diagnosis not present

## 2024-02-16 DIAGNOSIS — Z79899 Other long term (current) drug therapy: Secondary | ICD-10-CM | POA: Diagnosis not present

## 2024-02-22 NOTE — Progress Notes (Signed)
 Subjective:  Patient ID: Donna Little, female    DOB: 1945/09/06  Age: 78 y.o. MRN: [id number]  Chief Complaint  Patient presents with   Medical Management of Chronic Issues    HPI: Discussed the use of AI scribe software for clinical note transcription with the patient, who gave verbal consent to proceed.  History of Present Illness Donna Little is an 78 year old female with COPD and coronary artery disease who presents for follow-up after recent cardiac catheterization and concerns about leg numbness and a non-healing sore.  Dyspnea and chronic obstructive pulmonary disease - COPD managed with home oxygen  therapy; oxygen  saturation 96% this morning - Oxygen  used primarily at home and occasionally in the car - Occasionally dislodges oxygen  at night - Trelegy inhaler not used for past three weeks due to running out - Continues to smoke half a pack of cigarettes per day - History of pneumonia twice in the past year - No chest pain, shortness of breath, or palpitations  Coronary artery disease and cardiac symptoms - Recent cardiac catheterization revealed minimal cholesterol buildup and a blocked stent - Current medications: ranolazine  500 mg twice daily, amlodipine  2.5 mg daily, diltiazem  120 mg daily, atorvastatin  80 mg nightly, clopidogrel  75 mg daily - No recent use of nitroglycerin   Peripheral edema and diuretic therapy - Lasix  dose reduced to 40 mg twice daily after recent hospitalization - Swelling improved; only mild residual edema - Potassium supplements taken twice daily - Concern about low potassium levels during recent hospitalization  Non-healing lower extremity ulcer - Sore on ankle present for at least one month, not healing - Sore is painful and has some drainage - Applies peroxide and alcohol to wound; no use of antibiotic ointment or Vaseline  Neuropathic and musculoskeletal symptoms - Back pain described as 'pretty bad'; uses Tylenol  for relief -  Gabapentin  used for sciatica - Numbness from chin bone around left leg, present for about one month - Occasional hand tremors and difficulty with fine motor tasks  Appetite and constitutional symptoms - Decreased appetite - Leg feels warm but foot feels cold - No fevers, chills, sweats, earaches, sore throat, or stuffy nose - Runny nose present  Genitourinary symptoms - Milky urine following in-and-out catheterization during recent hospitalization, now resolved - No current dysuria       02/23/2024    9:56 AM 02/23/2024    8:52 AM 11/17/2023    8:46 AM 06/08/2023    3:23 PM 11/25/2022    2:09 PM  Depression screen PHQ 2/9  Decreased Interest 0 0 2 2 2   Down, Depressed, Hopeless 0 0 3 2 2   PHQ - 2 Score 0 0 5 4 4   Altered sleeping 0 0 1 1 2   Tired, decreased energy  3 3 2 2   Change in appetite 0 0 3 2 2   Feeling bad or failure about yourself  0 0 0 0 0  Trouble concentrating 0 0 0 0 0  Moving slowly or fidgety/restless 1 1 0 0 2  Suicidal thoughts 0 0 0 0 0  PHQ-9 Score 1 4 12  9  12    Difficult doing work/chores Not difficult at all Not difficult at all Not difficult at all Somewhat difficult Somewhat difficult     Data saved with a previous flowsheet row definition        02/23/2024    9:47 AM  Fall Risk   Falls in the past year? 1  Number falls in past  yr: 1  Injury with Fall? 0  Risk for fall due to : History of fall(s)  Follow up Falls evaluation completed    Patient Care Team: Sherre Clapper, MD as PCP - General (Family Medicine) Launie, Alexa Shad, MD as Referring Physician (Cardiology) Towana Charleston, MD (Gastroenterology) Skeet Juliene SAUNDERS, DO as Consulting Physician (Neurology) Ishmael Slough, MD as Consulting Physician (Rheumatology) Sheela Noreen Raddle., MD (Surgery) Powers, Marsa POUR, MD as Referring Physician (Neurosurgery) Ival Domino, FNP as Nurse Practitioner (Urology)   Review of Systems  No current facility-administered medications on file prior  to visit.   Current Outpatient Medications on File Prior to Visit  Medication Sig Dispense Refill   Acetaminophen  500 MG capsule Take 1,000 mg by mouth every 6 (six) hours as needed for pain (or headaches).     albuterol  (VENTOLIN  HFA) 108 (90 Base) MCG/ACT inhaler Inhale 2 puffs into the lungs every 6 (six) hours as needed. 18 g 1   amLODipine  (NORVASC ) 2.5 MG tablet Take 1 tablet (2.5 mg total) by mouth daily. 90 tablet 1   atorvastatin  (LIPITOR) 80 MG tablet Take 1 tablet (80 mg total) by mouth daily. (Patient taking differently: Take 80 mg by mouth at bedtime.) 90 tablet 1   citalopram  (CELEXA ) 10 MG tablet Take 1 tablet (10 mg total) by mouth daily. 90 tablet 3   clopidogrel  (PLAVIX ) 75 MG tablet Take 1 tablet by mouth once daily 90 tablet 3   dicyclomine  (BENTYL ) 20 MG tablet Take 1 tablet (20 mg total) by mouth 4 (four) times daily -  before meals and at bedtime. 360 tablet 1   diltiazem  (CARDIZEM  CD) 120 MG 24 hr capsule Take 1 capsule (120 mg total) by mouth daily. (Patient taking differently: Take 120 mg by mouth at bedtime.) 90 capsule 1   gabapentin  (NEURONTIN ) 800 MG tablet Take 1 tablet (800 mg total) by mouth 3 (three) times daily. 270 tablet 1   ipratropium-albuterol  (DUONEB) 0.5-2.5 (3) MG/3ML SOLN Take 3 mLs by nebulization every 6 (six) hours as needed. 360 mL 0   levETIRAcetam  (KEPPRA ) 500 MG tablet Take 1 tablet (500 mg total) by mouth 2 (two) times daily. 180 tablet 3   morphine  (MS CONTIN ) 15 MG 12 hr tablet Take 1 tablet (15 mg total) by mouth every 12 (twelve) hours. 60 tablet 0   morphine  (MS CONTIN ) 15 MG 12 hr tablet Take 1 tablet (15 mg total) by mouth every 12 (twelve) hours. 60 tablet 0   nitroGLYCERIN  (NITROSTAT ) 0.4 MG SL tablet Place under the tongue. (Patient taking differently: Place 0.4 mg under the tongue every 5 (five) minutes as needed for chest pain.)     pantoprazole  (PROTONIX ) 40 MG tablet Take 1 tablet (40 mg total) by mouth daily. 90 tablet 3    potassium chloride  SA (KLOR-CON  M) 20 MEQ tablet Take 1 tablet (20 mEq total) by mouth 2 (two) times daily with a meal. Resume on 12/20/2023 180 tablet 1   promethazine -dextromethorphan (PROMETHAZINE -DM) 6.25-15 MG/5ML syrup Take 5 mLs by mouth 4 (four) times daily as needed for cough. Do not use and drive - May make drowsy. 118 mL 0   ranolazine  (RANEXA ) 500 MG 12 hr tablet Take 1 tablet (500 mg total) by mouth 2 (two) times daily. 180 tablet 1   Past Medical History:  Diagnosis Date   A-fib [facility])    Atherosclerosis of both carotid arteries 2022   noted per CT on 05/14/20   Chronic pain syndrome  Chronic systolic heart failure (HCC)    Depression    Emphysema of lung (HCC) 2022   noted per CT scan on 05/14/20   Generalized atherosclerosis    GERD (gastroesophageal reflux disease)    Hypertension    IBS (irritable bowel syndrome)    Melanoma (HCC)    Other specified nontoxic goiter    Palpitations    PMR (polymyalgia rheumatica) 07/04/2020   Polymyalgia rheumatica    Primary pulmonary hypertension (HCC)    Pulmonary embolism (HCC)    Stroke (HCC)    ministroke times 3 Right side weakness   Supraventricular tachycardia    Type 2 diabetes mellitus (HCC)    diet controlled   Past Surgical History:  Procedure Laterality Date   ABDOMINAL HYSTERECTOMY     APPENDECTOMY     CATARACT EXTRACTION     CORONARY ANGIOPLASTY WITH STENT PLACEMENT  2021   KYPHOPLASTY  05/2020   L4-L5   TRIGGER FINGER RELEASE Right 03/17/2023   Procedure: right middle and ring finger RELEASE TRIGGER FINGER/A-1 PULLEY;  Surgeon: Romona Harari, MD;  Location: [facility];  Service: Orthopedics;  Laterality: Right;  mac and local    Family History  Problem Relation Age of Onset   Heart disease Mother    Diabetes Mother    Hyperlipidemia Mother    Hypertension Mother    Stroke Mother    Heart attack Mother    Heart disease Brother    Heart attack Brother    Stroke Brother     Hypertension Brother    Hyperlipidemia Brother    Social History   Socioeconomic History   Marital status: Single    Spouse name: Not on file   Number of children: 2   Years of education: Not on file   Highest education level: Not on file  Occupational History   Occupation: retired  Tobacco Use   Smoking status: Every Day    Current packs/day: 0.50    Average packs/day: 0.4 packs/day for 160.9 years (65.4 ttl pk-yrs)    Types: Cigarettes    Start date: 1965   Smokeless tobacco: Never   Tobacco comments:    Smokes 1/2 pack per day  Vaping Use   Vaping status: Never Used  Substance and Sexual Activity   Alcohol use: No    Alcohol/week: 0.0 standard drinks of alcohol   Drug use: No   Sexual activity: Not Currently  Other Topics Concern   Not on file  Social History Narrative   Not on file   Social Drivers of Health   Financial Resource Strain: Low Risk  (10/11/2023)   Received from [facility]   Overall Financial Resource Strain (CARDIA)    Difficulty of Paying Living Expenses: Not hard at all  Food Insecurity: No Food Insecurity (02/26/2024)   Hunger Vital Sign    Worried About Running Out of Food in the Last Year: Never true    Ran Out of Food in the Last Year: Never true  Transportation Needs: No Transportation Needs (02/26/2024)   PRAPARE - Administrator, Civil Service (Medical): No    Lack of Transportation (Non-Medical): No  Physical Activity: Inactive (02/23/2024)   Exercise Vital Sign    Days of Exercise per Week: 0 days    Minutes of Exercise per Session: 0 min  Stress: No Stress Concern Present (02/23/2024)   [employer] of Occupational Health - Occupational Stress Questionnaire    Feeling of Stress: Only a little  Social Connections: Moderately Isolated (02/26/2024)   Social Connection and Isolation Panel    Frequency of Communication with Friends and Family: More than three times a week    Frequency of Social Gatherings with Friends  and Family: More than three times a week    Attends Religious Services: More than 4 times per year    Active Member of Clubs or Organizations: No    Attends Engineer, Structural: Never    Marital Status: Divorced    Objective:  BP 126/70   Pulse 71   Temp 98.2 F (36.8 C)   Ht 5' 1.5 (1.562 m)   Wt 120 lb (54.4 kg)   LMP  (LMP Unknown)   SpO2 98%   BMI 22.31 kg/m      02/26/2024    9:31 PM 02/26/2024    9:30 PM 02/26/2024    8:46 AM  BP/Weight  Systolic BP 157 157 161  Diastolic BP 94 94 86    Physical Exam  {Perform Simple Foot Exam  Perform Detailed exam:1} {Insert foot Exam (Optional):30965}   Lab Results  Component Value Date   WBC 6.0 02/25/2024   HGB 16.1 (H) 02/25/2024   HCT 44.7 02/25/2024   PLT 269 02/25/2024   GLUCOSE 154 (H) 02/25/2024   CHOL 167 11/17/2023   TRIG 125 11/17/2023   HDL 56 11/17/2023   LDLCALC 89 11/17/2023   ALT 14 02/25/2024   AST 30 02/25/2024   NA 141 02/25/2024   K 3.2 (L) 02/25/2024   CL 99 02/25/2024   CREATININE 0.85 02/25/2024   BUN 16 02/25/2024   CO2 26 02/25/2024   TSH 1.020 11/25/2022   INR 1.0 02/25/2024   HGBA1C 5.4 02/26/2024    Results for orders placed or performed in visit on 02/23/24  Comprehensive metabolic panel with GFR   Collection Time: 02/23/24  9:50 AM  Result Value Ref Range   Glucose 97 70 - 99 mg/dL   BUN 13 8 - 27 mg/dL   Creatinine, Ser 9.02 0.57 - 1.00 mg/dL   eGFR 59 (L) >40 fO/fpw/8.26   BUN/Creatinine Ratio 13 12 - 28   Sodium 139 134 - 144 mmol/L   Potassium 4.8 3.5 - 5.2 mmol/L   Chloride 97 96 - 106 mmol/L   CO2 28 20 - 29 mmol/L   Calcium  9.8 8.7 - 10.3 mg/dL   Total Protein 6.2 6.0 - 8.5 g/dL   Albumin 4.2 3.8 - 4.8 g/dL   Globulin, Total 2.0 1.5 - 4.5 g/dL   Bilirubin Total 0.4 0.0 - 1.2 mg/dL   Alkaline Phosphatase 99 49 - 135 IU/L   AST 23 0 - 40 IU/L   ALT 15 0 - 32 IU/L  .  Assessment & Plan:   Assessment & Plan Adult wellness visit Routine wellness  visit with no acute issues. Blood pressure well-controlled. Cardiac catheterization showed minimal cholesterol buildup and a stent bypassing a blockage. Heart monitor normal. - Continue ranolazine , amlodipine , diltiazem , atorvastatin , Lasix , clopidogrel , potassium supplements. - Flu shot ordered. - Requested records from [facility] in [place]. - Ordered MRI of lumbar spine. - Ordered vascular study of legs.    Hypertensive heart disease with heart failure (HCC) Blood pressure well-controlled. Cardiac catheterization showed minimal cholesterol buildup and a stent bypassing a blockage. Heart monitor normal. - Continue ranolazine , amlodipine , diltiazem , atorvastatin , Lasix , clopidogrel , potassium supplements.    Intermittent claudication Non-healing ulcer on left lower extremity for at least a month. Sore, slightly run, no  infection. Differential includes poor blood flow or nerve issues. - Ordered vascular study of legs. - Advised application of antibiotic ointment or Vaseline to ulcer. Orders:   VAS US  ABI WITH/WO TBI; Future  Left sided sciatica Numbness and pain radiating down left leg, possibly due to pinched nerve. Managed with gabapentin . No recent MRI of back. - Ordered MRI of lumbar spine. - Continue gabapentin . Orders:   MR Lumbar Spine Wo Contrast; Future  Hypokalemia Previous hypokalemia episode resolved. Potassium levels were dangerously low during recent hospitalization. - Ordered chemistry panel to check potassium levels. Orders:   Comprehensive metabolic panel with GFR  COPD mixed type (HCC) COPD management suboptimal due to non-adherence to Trelegy inhaler. Continues smoking half a pack per day. - Prescribed Trelegy inhaler. - Advised reduction of smoking to a quarter pack per day by next visit. Orders:   Fluticasone-Umeclidin-Vilant (TRELEGY ELLIPTA ) 100-62.5-25 MCG/ACT AEPB; Inhale 1 puff into the lungs daily. Inhale into the lungs.  History of tobacco  use Recommended smoking cessation.    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  continue prolia  every 6 months.       Encounter for immunization  Orders:   Flu vaccine HIGH DOSE PF(Fluzone Trivalent)  Body mass index is 22.31 kg/m.    Meds ordered this encounter  Medications   DISCONTD: furosemide  (LASIX ) 40 MG tablet    Sig: Take 1 tablet (40 mg total) by mouth 2 (two) times daily.   Fluticasone-Umeclidin-Vilant (TRELEGY ELLIPTA ) 100-62.5-25 MCG/ACT AEPB    Sig: Inhale 1 puff into the lungs daily. Inhale into the lungs.    Dispense:  3 each    Refill:  3    Orders Placed This Encounter  Procedures   MR Lumbar Spine Wo Contrast   Flu vaccine HIGH DOSE PF(Fluzone Trivalent)   Comprehensive metabolic panel with GFR   VAS US  ABI WITH/WO TBI     I,Marla I Leal-Borjas,acting as a scribe for Abigail Free, MD.,have documented all relevant documentation on the behalf of Abigail Free, MD,as directed by  Abigail Free, MD while in the presence of Abigail Free, MD.   Follow-up: Return in about 3 months (around 05/25/2024) for chronic follow up.  An After Visit Summary was printed and given to the patient.  Abigail Free, MD [facility] [phone]

## 2024-02-23 ENCOUNTER — Encounter: Payer: Self-pay | Admitting: Family Medicine

## 2024-02-23 ENCOUNTER — Ambulatory Visit

## 2024-02-23 ENCOUNTER — Ambulatory Visit (INDEPENDENT_AMBULATORY_CARE_PROVIDER_SITE_OTHER): Admitting: Family Medicine

## 2024-02-23 VITALS — BP 126/70 | HR 71 | Temp 98.2°F | Ht 61.5 in | Wt 120.0 lb

## 2024-02-23 DIAGNOSIS — M810 Age-related osteoporosis without current pathological fracture: Secondary | ICD-10-CM

## 2024-02-23 DIAGNOSIS — L97321 Non-pressure chronic ulcer of left ankle limited to breakdown of skin: Secondary | ICD-10-CM

## 2024-02-23 DIAGNOSIS — Z0000 Encounter for general adult medical examination without abnormal findings: Secondary | ICD-10-CM

## 2024-02-23 DIAGNOSIS — E876 Hypokalemia: Secondary | ICD-10-CM | POA: Diagnosis not present

## 2024-02-23 DIAGNOSIS — M5432 Sciatica, left side: Secondary | ICD-10-CM

## 2024-02-23 DIAGNOSIS — I110 Hypertensive heart disease with heart failure: Secondary | ICD-10-CM

## 2024-02-23 DIAGNOSIS — M0600 Rheumatoid arthritis without rheumatoid factor, unspecified site: Secondary | ICD-10-CM | POA: Diagnosis not present

## 2024-02-23 DIAGNOSIS — Z79899 Other long term (current) drug therapy: Secondary | ICD-10-CM | POA: Diagnosis not present

## 2024-02-23 DIAGNOSIS — I739 Peripheral vascular disease, unspecified: Secondary | ICD-10-CM | POA: Diagnosis not present

## 2024-02-23 DIAGNOSIS — Z87891 Personal history of nicotine dependence: Secondary | ICD-10-CM | POA: Diagnosis not present

## 2024-02-23 DIAGNOSIS — Z23 Encounter for immunization: Secondary | ICD-10-CM

## 2024-02-23 DIAGNOSIS — J449 Chronic obstructive pulmonary disease, unspecified: Secondary | ICD-10-CM | POA: Diagnosis not present

## 2024-02-23 LAB — COMPREHENSIVE METABOLIC PANEL WITH GFR
ALT: 15 IU/L (ref 0–32)
AST: 23 IU/L (ref 0–40)
Albumin: 4.2 g/dL (ref 3.8–4.8)
Alkaline Phosphatase: 99 IU/L (ref 49–135)
BUN/Creatinine Ratio: 13 (ref 12–28)
BUN: 13 mg/dL (ref 8–27)
Bilirubin Total: 0.4 mg/dL (ref 0.0–1.2)
CO2: 28 mmol/L (ref 20–29)
Calcium: 9.8 mg/dL (ref 8.7–10.3)
Chloride: 97 mmol/L (ref 96–106)
Creatinine, Ser: 0.97 mg/dL (ref 0.57–1.00)
Globulin, Total: 2 g/dL (ref 1.5–4.5)
Glucose: 97 mg/dL (ref 70–99)
Potassium: 4.8 mmol/L (ref 3.5–5.2)
Sodium: 139 mmol/L (ref 134–144)
Total Protein: 6.2 g/dL (ref 6.0–8.5)
eGFR: 59 mL/min/1.73 — ABNORMAL LOW (ref >59)

## 2024-02-23 MED ORDER — TRELEGY ELLIPTA 100-62.5-25 MCG/ACT IN AEPB: 1.0000 | INHALATION_SPRAY | Freq: Every day | RESPIRATORY_TRACT | 3 refills | Status: AC

## 2024-02-23 MED ORDER — FUROSEMIDE 40 MG PO TABS
40.0000 mg | ORAL_TABLET | Freq: Two times a day (BID) | ORAL | Status: DC
Start: 2024-02-23 — End: 2024-02-25

## 2024-02-23 NOTE — Progress Notes (Signed)
 Chief Complaint  Patient presents with   Annual Exam     Subjective:   Donna Little is a 78 y.o. female who presents for a Medicare Annual Wellness Visit.  Allergies (verified) Codeine, Macrobid  [nitrofurantoin ], Methocarbamol, Sertraline, and Tizanidine   History: Past Medical History:  Diagnosis Date   A-fib (HCC)    Atherosclerosis of both carotid arteries 2022   noted per CT on 05/14/20   Chronic pain syndrome    Chronic systolic heart failure (HCC)    Depression    Emphysema of lung (HCC) 2022   noted per CT scan on 05/14/20   Generalized atherosclerosis    GERD (gastroesophageal reflux disease)    Hypertension    IBS (irritable bowel syndrome)    Melanoma (HCC)    Other specified nontoxic goiter    Palpitations    PMR (polymyalgia rheumatica) 07/04/2020   Polymyalgia rheumatica    Primary pulmonary hypertension (HCC)    Pulmonary embolism (HCC)    Stroke (HCC)    ministroke times 3 Right side weakness   Supraventricular tachycardia    Type 2 diabetes mellitus (HCC)    diet controlled   Past Surgical History:  Procedure Laterality Date   ABDOMINAL HYSTERECTOMY     APPENDECTOMY     CATARACT EXTRACTION     CORONARY ANGIOPLASTY WITH STENT PLACEMENT  2021   KYPHOPLASTY  05/2020   L4-L5   TRIGGER FINGER RELEASE Right 03/17/2023   Procedure: right middle and ring finger RELEASE TRIGGER FINGER/A-1 PULLEY;  Surgeon: Romona Harari, MD;  Location: [facility];  Service: Orthopedics;  Laterality: Right;  mac and local   Family History  Problem Relation Age of Onset   Heart disease Mother    Diabetes Mother    Hyperlipidemia Mother    Hypertension Mother    Stroke Mother    Heart attack Mother    Heart disease Brother    Heart attack Brother    Stroke Brother    Hypertension Brother    Hyperlipidemia Brother    Social History   Occupational History   Occupation: retired  Tobacco Use   Smoking status: Every Day    Current packs/day:  0.50    Average packs/day: 0.4 packs/day for 160.9 years (65.4 ttl pk-yrs)    Types: Cigarettes    Start date: 1965   Smokeless tobacco: Never   Tobacco comments:    Smokes 1/2 pack per day  Vaping Use   Vaping status: Never Used  Substance and Sexual Activity   Alcohol use: No    Alcohol/week: 0.0 standard drinks of alcohol   Drug use: No   Sexual activity: Not Currently   Tobacco Counseling Ready to quit: Not Answered Counseling given: Not Answered Tobacco comments: Smokes 1/2 pack per day  SDOH Screenings   Food Insecurity: No Food Insecurity (02/23/2024)  Housing: Unknown (02/23/2024)  Transportation Needs: No Transportation Needs (02/23/2024)  Utilities: Not At Risk (02/23/2024)  Alcohol Screen: Low Risk  (06/08/2023)  Depression (PHQ2-9): Low Risk  (02/23/2024)  Financial Resource Strain: Low Risk  (10/11/2023)   Received from [facility]  Physical Activity: Inactive (02/23/2024)  Social Connections: Moderately Isolated (02/23/2024)  Stress: No Stress Concern Present (02/23/2024)  Tobacco Use: High Risk (02/23/2024)  Health Literacy: Inadequate Health Literacy (02/23/2024)   See flowsheets for full screening details  Depression Screen PHQ 2 & 9 Depression Scale- Over the past 2 weeks, how often have you been bothered by any of the following problems?  Little interest or pleasure in doing things: 0 Feeling down, depressed, or hopeless (PHQ Adolescent also includes...irritable): 0 PHQ-2 Total Score: 0 Trouble falling or staying asleep, or sleeping too much: 0 Feeling tired or having little energy: 3 Poor appetite or overeating (PHQ Adolescent also includes...weight loss): 0 Feeling bad about yourself - or that you are a failure or have let yourself or your family down: 0 Trouble concentrating on things, such as reading the newspaper or watching television (PHQ Adolescent also includes...like school work): 0 Moving or speaking so slowly that other people could have  noticed. Or the opposite - being so fidgety or restless that you have been moving around a lot more than usual: 1 Thoughts that you would be better off dead, or of hurting yourself in some way: 0 PHQ-9 Total Score: 1 If you checked off any problems, how difficult have these problems made it for you to do your work, take care of things at home, or get along with other people?: Not difficult at all  Depression Treatment Depression Interventions/Treatment : Currently on Treatment     Goals Addressed               This Visit's Progress     Patient Stated (pt-stated)        Patient stated she would like to live another year.        Visit info / Clinical Intake: Medicare Wellness Visit Type:: Subsequent Annual Wellness Visit Persons participating in visit:: patient Medicare Wellness Visit Mode:: In-person (required for WTM) Information given by:: patient Interpreter Needed?: No Pre-visit prep was completed: no AWV questionnaire completed by patient prior to visit?: no Living arrangements:: (!) lives alone Patient's Overall Health Status Rating: good Typical amount of pain: none Does pain affect daily life?: no Are you currently prescribed opioids?: (!) yes  Dietary Habits and Nutritional Risks How many meals a day?: 2 Eats fruit and vegetables daily?: yes Most meals are obtained by: preparing own meals; eating out; having others provide food In the last 2 weeks, have you had any of the following?: none Diabetic:: no  Functional Status Activities of Daily Living (to include ambulation/medication): Independent Ambulation: Independent Medication Administration: Independent Home Management: Independent Manage your own finances?: yes Primary transportation is: driving; family/friends Concerns about vision?: no *vision screening is required for WTM* Concerns about hearing?: no  Fall Screening Falls in the past year?: 1 Number of falls in past year: 1 Was there an injury  with Fall?: 0 Fall Risk Category Calculator: 2 Patient Fall Risk Level: Moderate Fall Risk  Fall Risk Patient at Risk for Falls Due to: History of fall(s) Fall risk Follow up: Falls evaluation completed  Home and Transportation Safety: All rugs have non-skid backing?: yes All stairs or steps have railings?: N/A, no stairs Grab bars in the bathtub or shower?: yes Have non-skid surface in bathtub or shower?: yes Good home lighting?: yes Regular seat belt use?: yes Hospital stays in the last year:: (!) no; yes How many hospital stays:: 3 Reason: one was for heart cath, and other is sickness, and other e. coli.  Cognitive Assessment Difficulty concentrating, remembering, or making decisions? : no Will 6CIT or Mini Cog be Completed: yes What month is it?: 0 points Give patient an address phrase to remember (5 components): jack and jill went [place] the hill. About what time is it?: 0 points Count backwards from 20 to 1: 0 points Say the months of the year in reverse: 0 points Repeat  the address phrase from earlier: 0 points  Advance Directives (For Healthcare) Does Patient Have a Medical Advance Directive?: Yes Type of Advance Directive: Healthcare Power of [place]; Living will Copy of Healthcare Power of Attorney in Chart?: No - copy requested Copy of Living Will in Chart?: No - copy requested Would patient like information on creating a medical advance directive?: No - Patient declined  Reviewed/Updated  Reviewed/Updated: Reviewed All (Medical, Surgical, Family, Medications, Allergies, Care Teams, Patient Goals)        Objective:    Today's Vitals   02/23/24 1001  BP: 126/70  Pulse: 71  Temp: 98.2 F (36.8 C)  SpO2: 98%  Weight: 120 lb (54.4 kg)  Height: 5' 1.5 (1.562 m)   Body mass index is 22.31 kg/m.  Current Medications (verified) Outpatient Encounter Medications as of 02/23/2024  Medication Sig   Acetaminophen  500 MG capsule Take 1,000 mg by mouth every 6  (six) hours as needed for pain (or headaches).   albuterol  (VENTOLIN  HFA) 108 (90 Base) MCG/ACT inhaler Inhale 2 puffs into the lungs every 6 (six) hours as needed. (Patient taking differently: Inhale 2 puffs into the lungs every 6 (six) hours as needed for wheezing or shortness of breath.)   amLODipine  (NORVASC ) 2.5 MG tablet Take 1 tablet (2.5 mg total) by mouth daily.   atorvastatin  (LIPITOR) 80 MG tablet Take 1 tablet (80 mg total) by mouth daily. (Patient taking differently: Take 80 mg by mouth at bedtime.)   citalopram  (CELEXA ) 10 MG tablet Take 1 tablet (10 mg total) by mouth daily.   clopidogrel  (PLAVIX ) 75 MG tablet Take 1 tablet by mouth once daily   dicyclomine  (BENTYL ) 20 MG tablet Take 1 tablet (20 mg total) by mouth 4 (four) times daily -  before meals and at bedtime.   diltiazem  (CARDIZEM  CD) 120 MG 24 hr capsule Take 1 capsule (120 mg total) by mouth daily. (Patient taking differently: Take 120 mg by mouth at bedtime.)   Fluticasone-Umeclidin-Vilant (TRELEGY ELLIPTA ) 100-62.5-25 MCG/ACT AEPB Inhale 1 puff into the lungs daily. Inhale into the lungs.   furosemide  (LASIX ) 40 MG tablet Take 1 tablet (40 mg total) by mouth 2 (two) times daily.   gabapentin  (NEURONTIN ) 800 MG tablet Take 1 tablet (800 mg total) by mouth 3 (three) times daily.   ipratropium-albuterol  (DUONEB) 0.5-2.5 (3) MG/3ML SOLN Take 3 mLs by nebulization every 6 (six) hours as needed. (Patient taking differently: Take 3 mLs by nebulization every 6 (six) hours as needed (for shortness of breath or wheezing).)   levETIRAcetam  (KEPPRA ) 500 MG tablet Take 1 tablet (500 mg total) by mouth 2 (two) times daily.   morphine  (MS CONTIN ) 15 MG 12 hr tablet Take 1 tablet (15 mg total) by mouth every 12 (twelve) hours.   [START ON 02/25/2024] morphine  (MS CONTIN ) 15 MG 12 hr tablet Take 1 tablet (15 mg total) by mouth every 12 (twelve) hours.   nitroGLYCERIN (NITROSTAT) 0.4 MG SL tablet Place under the tongue. (Patient taking  differently: Place 0.4 mg under the tongue every 5 (five) minutes as needed for chest pain.)   pantoprazole  (PROTONIX ) 40 MG tablet Take 1 tablet (40 mg total) by mouth daily.   potassium chloride  SA (KLOR-CON  M) 20 MEQ tablet Take 1 tablet (20 mEq total) by mouth 2 (two) times daily with a meal. Resume on 12/20/2023   promethazine -dextromethorphan (PROMETHAZINE -DM) 6.25-15 MG/5ML syrup Take 5 mLs by mouth 4 (four) times daily as needed for cough. Do not use and drive -  May make drowsy.   ranolazine  (RANEXA ) 500 MG 12 hr tablet Take 1 tablet (500 mg total) by mouth 2 (two) times daily.   [EXPIRED] denosumab  (PROLIA ) injection 60 mg    No facility-administered encounter medications on file as of 02/23/2024.   Hearing/Vision screen No results found. Immunizations and Health Maintenance Health Maintenance  Topic Date Due   Lung Cancer Screening  Never done   OPHTHALMOLOGY EXAM  07/29/2023   FOOT EXAM  12/16/2023   HEMOGLOBIN A1C  05/19/2024   Diabetic kidney evaluation - Urine ACR  06/07/2024   Diabetic kidney evaluation - eGFR measurement  12/15/2024   Medicare Annual Wellness (AWV)  02/22/2025   DTaP/Tdap/Td (4 - Td or Tdap) 02/04/2031   Pneumococcal Vaccine: 50+ Years  Completed   Influenza Vaccine  Completed   DEXA SCAN  Completed   Zoster Vaccines- Shingrix  Completed   Meningococcal B Vaccine  Aged Out   Mammogram  Discontinued   Colonoscopy  Discontinued   COVID-19 Vaccine  Discontinued        Assessment/Plan:  This is a routine wellness examination for Donna Little.  Patient Care Team: Sherre Clapper, MD as PCP - General (Family Medicine) Launie, Alexa Shad, MD as Referring Physician (Cardiology) Towana Charleston, MD (Gastroenterology) Skeet Juliene SAUNDERS, DO as Consulting Physician (Neurology) Ishmael Slough, MD as Consulting Physician (Rheumatology) Sheela Noreen Raddle., MD (Surgery) Powers, Marsa POUR, MD as Referring Physician (Neurosurgery) Ival Domino, FNP as Nurse Practitioner  (Urology)  I have personally reviewed and noted the following in the patient's chart:   Medical and social history Use of alcohol, tobacco or illicit drugs  Current medications and supplements including opioid prescriptions. Functional ability and status Nutritional status Physical activity Advanced directives List of other physicians Hospitalizations, surgeries, and ER visits in previous 12 months Vitals Screenings to include cognitive, depression, and falls Referrals and appointments  No orders of the defined types were placed in this encounter.  In addition, I have reviewed and discussed with patient certain preventive protocols, quality metrics, and best practice recommendations. A written personalized care plan for preventive services as well as general preventive health recommendations were provided to patient.   Coolidge Mailman, CMA   02/23/2024   Return in 1 year (on 02/22/2025).  After Visit Summary: printed at this visit for patient and her son.   Nurse Notes: I spent 20 minutes with patient and he rson. Patient and her son has no questions or concerns at this time.

## 2024-02-23 NOTE — Assessment & Plan Note (Addendum)
 Blood pressure well-controlled. Cardiac catheterization showed minimal cholesterol buildup and a stent bypassing a blockage. Heart monitor normal. - Continue ranolazine , amlodipine , diltiazem , atorvastatin , Lasix , clopidogrel , potassium supplements.

## 2024-02-23 NOTE — Assessment & Plan Note (Addendum)
 Previous hypokalemia episode resolved. Potassium levels were dangerously low during recent hospitalization. - Ordered chemistry panel to check potassium levels. Orders:   Comprehensive metabolic panel with GFR

## 2024-02-23 NOTE — Assessment & Plan Note (Addendum)
 COPD management suboptimal due to non-adherence to Trelegy inhaler. Continues smoking half a pack per day. - Prescribed Trelegy inhaler. - Advised reduction of smoking to a quarter pack per day by next visit. Orders:   Fluticasone-Umeclidin-Vilant (TRELEGY ELLIPTA ) 100-62.5-25 MCG/ACT AEPB; Inhale 1 puff into the lungs daily. Inhale into the lungs.

## 2024-02-23 NOTE — Patient Instructions (Signed)
  VISIT SUMMARY: You had a follow-up visit to discuss your COPD, coronary artery disease, leg numbness, and a non-healing sore. Your COPD management needs improvement, and you were advised to reduce smoking. Your heart condition is stable, and you will continue your current medications. We also addressed your leg ulcer and back pain, and ordered some tests to investigate further.  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 -Start using the Trelegy inhaler as prescribed. -Try to reduce smoking to a quarter pack per day by your next visit.  HYPERTENSIVE HEART DISEASE WITH HEART FAILURE: Your blood pressure is well-controlled, and your recent cardiac catheterization showed minimal cholesterol buildup and a stent bypassing a blockage. -Continue taking your current medications: ranolazine , amlodipine , diltiazem , atorvastatin , Lasix , clopidogrel , and potassium supplements.  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 -We ordered a vascular study of your legs to check blood flow. -Apply antibiotic ointment or Vaseline to the ulcer.  LEFT-SIDED SCIATICA: You have numbness and pain radiating down your left leg, possibly due to a pinched nerve. -We ordered an MRI of your lumbar spine to investigate further. -Continue taking gabapentin  for pain management.  HYPOKALEMIA, RESOLVED: Your previous episode of low potassium levels has resolved. -We ordered a chemistry panel to check your potassium levels.  GENERAL HEALTH MAINTENANCE: Routine wellness visit with no acute issues. Blood pressure well-controlled. Heart monitor normal. -Flu shot ordered. -Requested records from [facility] in [place].  If I have ordered a referral,  lab work, or a test, please watch for messages/letters in your [place]. Please be aware of unknown numbers, as this may be a specialist's office attempting to call and schedule your appointment. You may wish to enter the specialist's phone number in your contacts, so your phone will not block the calls as SPAM. If you have NOT been contacted with in 2 weeks: Please call the specialist's office  Call [facility].

## 2024-02-23 NOTE — Patient Instructions (Signed)
 Donna Little , Thank you for taking time to come for your Medicare Wellness Visit. I appreciate your ongoing commitment to your health goals. Please review the following plan we discussed and let me know if I can assist you in the future.   These are the goals we discussed:  Goals       Patient Stated (pt-stated)      Patient stated she would like to live another year.       Pharmacy Care Plan      CARE PLAN ENTRY (see longitudinal plan of care for additional care plan information)  Current Barriers:  Chronic Disease Management support, education, and care coordination needs related to Hypertension, Hyperlipidemia, and COPD   Hypertension BP Readings from Last 3 Encounters:  03/19/20 110/60  03/18/20 124/66  01/16/20 (!) 144/68   Pharmacist Clinical Goal(s): Over the next 90 days, patient will work with PharmD and providers to maintain BP goal <140/90 Current regimen:  Furosemide  80 mg bid Metoprolol  Succinate 25 mg bid  Interventions: Reviewed medication list.  Recommend patient work on improving diet and avoid salting food.  Recommended elevating feet to minimize swelling.  Patient self care activities - Over the next 90 days, patient will: Ensure daily salt intake < 2300 mg/day  Hyperlipidemia Lab Results  Component Value Date/Time   LDLCALC 98 01/16/2020 09:21 AM   Pharmacist Clinical Goal(s): Over the next 90 days, patient will work with PharmD and providers to maintain LDL goal < 100 Current regimen:  Pravastatin  20 mg daily  Interventions: Recommend patient watch diet and focus on incorporating lean meats and vegetables.  Patient unable to exercise due to back. Has upcoming surgery in February.  Patient self care activities - Over the next 90 days, patient will: Continue to take pravastatin  as precribed.   Depression Pharmacist Clinical Goal(s): Over the next 90 days, patient will work with PharmD and providers to achieve goal of improved management of symptoms  of crying Current regimen:  Citalopram  20 mg daily  Interventions: Discussed taking medication daily as prescribed.  Patient self care activities - Over the next 90 days, patient will: Contact provider with any concerns of questions  COPD Pharmacist Clinical Goal(s) Over the next 90 days, patient will work with PharmD and providers to reduce risk of COPD flares.  Current regimen:  Albuterol  prn  Trelegy inhaler daily  Interventions: Continue Trelegy inhaler daily for prevention of shortness of breath or wheezing.  Patient self care activities - Over the next 90 days, patient will: Take Trelegy daily as prescribed.   Medication management Pharmacist Clinical Goal(s): Over the next 90 days, patient will work with PharmD and providers to achieve optimal medication adherence Current pharmacy: [facility] Interventions Comprehensive medication review performed. Continue current medication management strategy Patient self care activities - Over the next 90 days, patient will: Focus on medication adherence by continuing to use pill box.  Take medications as prescribed Report any questions or concerns to PharmD and/or provider(s)  Please see past updates related to this goal by clicking on the Past Updates button in the selected goal        Prevent falls      Use cane when ambulating.  Keep floor clear of throw rugs and items that may cause you to trip.        This is a list of the screening recommended for you and due dates:  Health Maintenance  Topic Date Due   Screening for Lung Cancer  Never  done   Eye exam for diabetics  07/29/2023   Complete foot exam   12/16/2023   Hemoglobin A1C  05/19/2024   Yearly kidney health urinalysis for diabetes  06/07/2024   Yearly kidney function blood test for diabetes  12/15/2024   Medicare Annual Wellness Visit  02/22/2025   DTaP/Tdap/Td vaccine (4 - Td or Tdap) 02/04/2031   Pneumococcal Vaccine for age over 18  Completed   Flu  Shot  Completed   DEXA scan (bone density measurement)  Completed   Zoster (Shingles) Vaccine  Completed   Meningitis B Vaccine  Aged Out   Breast Cancer Screening  Discontinued   Colon Cancer Screening  Discontinued   COVID-19 Vaccine  Discontinued    Advanced directives: yes    Next appointment: Follow up in one year for your annual wellness visit    Preventive Care 65 Years and Older, Female Preventive care refers to lifestyle choices and visits with your health care provider that can promote health and wellness. What does preventive care include? A yearly physical exam. This is also called an annual well check. Dental exams once or twice a year. Routine eye exams. Ask your health care provider how often you should have your eyes checked. Personal lifestyle choices, including: Daily care of your teeth and gums. Regular physical activity. Eating a healthy diet. Avoiding tobacco and drug use. Limiting alcohol use. Practicing safe sex. Taking low-dose aspirin every day. Taking vitamin and mineral supplements as recommended by your health care provider. What happens during an annual well check? The services and screenings done by your health care provider during your annual well check will depend on your age, overall health, lifestyle risk factors, and family history of disease. Counseling  Your health care provider may ask you questions about your: Alcohol use. Tobacco use. Drug use. Emotional well-being. Home and relationship well-being. Sexual activity. Eating habits. History of falls. Memory and ability to understand (cognition). Work and work astronomer. Reproductive health. Screening  You may have the following tests or measurements: Height, weight, and BMI. Blood pressure. Lipid and cholesterol levels. These may be checked every 5 years, or more frequently if you are over 75 years old. Skin check. Lung cancer screening. You may have this screening every year  starting at age 79 if you have a 30-pack-year history of smoking and currently smoke or have quit within the past 15 years. Fecal occult blood test (FOBT) of the stool. You may have this test every year starting at age 5. Flexible sigmoidoscopy or colonoscopy. You may have a sigmoidoscopy every 5 years or a colonoscopy every 10 years starting at age 65. Hepatitis C blood test. Hepatitis B blood test. Sexually transmitted disease (STD) testing. Diabetes screening. This is done by checking your blood sugar (glucose) after you have not eaten for a while (fasting). You may have this done every 1-3 years. Bone density scan. This is done to screen for osteoporosis. You may have this done starting at age 46. Mammogram. This may be done every 1-2 years. Talk to your health care provider about how often you should have regular mammograms. Talk with your health care provider about your test results, treatment options, and if necessary, the need for more tests. Vaccines  Your health care provider may recommend certain vaccines, such as: Influenza vaccine. This is recommended every year. Tetanus, diphtheria, and acellular pertussis (Tdap, Td) vaccine. You may need a Td booster every 10 years. Zoster vaccine. You may need this after age 22.  Pneumococcal 13-valent conjugate (PCV13) vaccine. One dose is recommended after age 14. Pneumococcal polysaccharide (PPSV23) vaccine. One dose is recommended after age 80. Talk to your health care provider about which screenings and vaccines you need and how often you need them. This information is not intended to replace advice given to you by your health care provider. Make sure you discuss any questions you have with your health care provider. Document Released: 04/19/2015 Document Revised: 12/11/2015 Document Reviewed: 01/22/2015 Elsevier Interactive Patient Education  2017 [employer].  Fall Prevention in the Home Falls can cause injuries. They can happen to  people of all ages. There are many things you can do to make your home safe and to help prevent falls. What can I do on the outside of my home? Regularly fix the edges of walkways and driveways and fix any cracks. Remove anything that might make you trip as you walk through a door, such as a raised step or threshold. Trim any bushes or trees on the path to your home. Use bright outdoor lighting. Clear any walking paths of anything that might make someone trip, such as rocks or tools. Regularly check to see if handrails are loose or broken. Make sure that both sides of any steps have handrails. Any raised decks and porches should have guardrails on the edges. Have any leaves, snow, or ice cleared regularly. Use sand or salt on walking paths during winter. Clean up any spills in your garage right away. This includes oil or grease spills. What can I do in the bathroom? Use night lights. Install grab bars by the toilet and in the tub and shower. Do not use towel bars as grab bars. Use non-skid mats or decals in the tub or shower. If you need to sit down in the shower, use a plastic, non-slip stool. Keep the floor dry. Clean up any water that spills on the floor as soon as it happens. Remove soap buildup in the tub or shower regularly. Attach bath mats securely with double-sided non-slip rug tape. Do not have throw rugs and other things on the floor that can make you trip. What can I do in the bedroom? Use night lights. Make sure that you have a light by your bed that is easy to reach. Do not use any sheets or blankets that are too big for your bed. They should not hang down onto the floor. Have a firm chair that has side arms. You can use this for support while you get dressed. Do not have throw rugs and other things on the floor that can make you trip. What can I do in the kitchen? Clean up any spills right away. Avoid walking on wet floors. Keep items that you use a lot in easy-to-reach  places. If you need to reach something above you, use a strong step stool that has a grab bar. Keep electrical cords out of the way. Do not use floor polish or wax that makes floors slippery. If you must use wax, use non-skid floor wax. Do not have throw rugs and other things on the floor that can make you trip. What can I do with my stairs? Do not leave any items on the stairs. Make sure that there are handrails on both sides of the stairs and use them. Fix handrails that are broken or loose. Make sure that handrails are as long as the stairways. Check any carpeting to make sure that it is firmly attached to the stairs. Fix any  carpet that is loose or worn. Avoid having throw rugs at the top or bottom of the stairs. If you do have throw rugs, attach them to the floor with carpet tape. Make sure that you have a light switch at the top of the stairs and the bottom of the stairs. If you do not have them, ask someone to add them for you. What else can I do to help prevent falls? Wear shoes that: Do not have high heels. Have rubber bottoms. Are comfortable and fit you well. Are closed at the toe. Do not wear sandals. If you use a stepladder: Make sure that it is fully opened. Do not climb a closed stepladder. Make sure that both sides of the stepladder are locked into place. Ask someone to hold it for you, if possible. Clearly mark and make sure that you can see: Any grab bars or handrails. First and last steps. Where the edge of each step is. Use tools that help you move around (mobility aids) if they are needed. These include: Canes. Walkers. Scooters. Crutches. Turn on the lights when you go into a dark area. Replace any light bulbs as soon as they burn out. Set up your furniture so you have a clear path. Avoid moving your furniture around. If any of your floors are uneven, fix them. If there are any pets around you, be aware of where they are. Review your medicines with your doctor.  Some medicines can make you feel dizzy. This can increase your chance of falling. Ask your doctor what other things that you can do to help prevent falls. This information is not intended to replace advice given to you by your health care provider. Make sure you discuss any questions you have with your health care provider. Document Released: 01/17/2009 Document Revised: 08/29/2015 Document Reviewed: 04/27/2014 Elsevier Interactive Patient Education  2017 [employer].

## 2024-02-24 ENCOUNTER — Ambulatory Visit: Payer: Self-pay

## 2024-02-24 ENCOUNTER — Ambulatory Visit: Payer: Self-pay | Admitting: Family Medicine

## 2024-02-24 ENCOUNTER — Telehealth: Payer: Self-pay | Admitting: Family Medicine

## 2024-02-24 NOTE — Telephone Encounter (Unsigned)
 Copied from CRM #[id number]. Topic: Clinical - Medication Refill >> Feb 24, 2024  9:13 AM Emylou G wrote: Medication: morphine  (MS CONTIN ) 15 MG 12 hr tablet  Has the patient contacted their pharmacy? No (Agent: If no, request that the patient contact the pharmacy for the refill. If patient does not wish to contact the pharmacy document the reason why and proceed with request.) (Agent: If yes, when and what did the pharmacy advise?)  This is the patient's preferred pharmacy:  [facility] [phone] - [place], [place] - [address] AT [facility] [address] [facility] [address] [place] [address] Phone: [phone] Fax: [phone]    Is this the correct pharmacy for this prescription? Yes If no, delete pharmacy and type the correct one.   Has the prescription been filled recently? No  Is the patient out of the medication? Yes  Has the patient been seen for an appointment in the last year OR does the patient have an upcoming appointment? Yes  Can we respond through MyChart? No  Agent: Please be advised that Rx refills may take up to 3 business days. We ask that you follow-up with your pharmacy.

## 2024-02-24 NOTE — Telephone Encounter (Signed)
 Future Rx order pending by PCP - to be released 02/25/24.

## 2024-02-24 NOTE — Telephone Encounter (Signed)
 Duplicate

## 2024-02-24 NOTE — Telephone Encounter (Signed)
 FYI Only or Action Required?: Action required by provider: Medication request.  Patient was last seen in primary care on 02/23/2024 by Sherre Clapper, MD.  Called Nurse Triage reporting Diarrhea.  Symptoms began today.  Interventions attempted: Nothing.  Symptoms are: gradually worsening.  Triage Disposition: See HCP Within 4 Hours (Or PCP Triage)  Patient/caregiver understands and will follow disposition?: No, wishes to speak with PCP         Message from [facility] sent at 02/24/2024  1:26 PM EST  Summary: Diarrhea   Reason for Triage: Diarrhea         Reason for Disposition  [1] SEVERE diarrhea (e.g., 7 or more times / day more than normal) AND [2] age > 60 years  Answer Assessment - Initial Assessment Questions This RN offered pt an appointment in office or UC to be seen for symptoms and she refused. Pt states she does not have transportation. Phone disconnected during call. Returned call and pt states she would like medication to be called into pharmacy below.    [facility] [address] [place] - [address]  [address], [place] [address]      1. DIARRHEA SEVERITY: How bad is the diarrhea? How many more stools have you had in the past 24 hours than normal?      7 times  2. ONSET: When did the diarrhea begin?      This morning  3. STOOL DESCRIPTION:  How loose or watery is the diarrhea? What is the stool color? Is there any blood or mucous in the stool?     Loose and watery  4. VOMITING: Are you also vomiting? If Yes, ask: How many times in the past 24 hours?      Denies  5. ABDOMEN PAIN SEVERITY: If present, ask: How bad is the pain?  (e.g., Scale 1-10; mild, moderate, or severe)     8/10  6. ORAL INTAKE: If vomiting, Have you been able to drink liquids? How much liquids have you had in the past 24 hours?     Trying to drink fluids but states she has drank about 8 oz of water  7. HYDRATION: Any signs of  dehydration? (e.g., dry mouth [not just dry lips], too weak to stand, dizziness, new weight loss) When did you last urinate?     Too weak to stand and feels jittery and nervous  8. OTHER SYMPTOMS: Do you have any other symptoms? (e.g., fever, blood in stool)       Denies  Protocols used: Diarrhea-A-AH

## 2024-02-24 NOTE — Telephone Encounter (Unsigned)
 Copied from CRM #[id number]. Topic: Clinical - Medication Refill >> Feb 24, 2024  9:14 AM Emylou G wrote: Medication: furosemide  (LASIX ) 40 MG tablet  Has the patient contacted their pharmacy? No (Agent: If no, request that the patient contact the pharmacy for the refill. If patient does not wish to contact the pharmacy document the reason why and proceed with request.) (Agent: If yes, when and what did the pharmacy advise?)  This is the patient's preferred pharmacy:    [facility] [address] [place] - [address] [place] [address] Phone: [phone] Fax: [phone]  Is this the correct pharmacy for this prescription? Yes If no, delete pharmacy and type the correct one.   Has the prescription been filled recently? No  Is the patient out of the medication? Yes  Has the patient been seen for an appointment in the last year OR does the patient have an upcoming appointment? Yes  Can we respond through MyChart? No  Agent: Please be advised that Rx refills may take up to 3 business days. We ask that you follow-up with your pharmacy.

## 2024-02-25 ENCOUNTER — Emergency Department (HOSPITAL_BASED_OUTPATIENT_CLINIC_OR_DEPARTMENT_OTHER)

## 2024-02-25 ENCOUNTER — Inpatient Hospital Stay (HOSPITAL_BASED_OUTPATIENT_CLINIC_OR_DEPARTMENT_OTHER)
Admission: EM | Admit: 2024-02-25 | Discharge: 2024-02-28 | DRG: 689 | Disposition: A | Discharge status: Home Health | Attending: Internal Medicine | Admitting: Internal Medicine

## 2024-02-25 ENCOUNTER — Other Ambulatory Visit: Payer: Self-pay

## 2024-02-25 ENCOUNTER — Encounter (HOSPITAL_BASED_OUTPATIENT_CLINIC_OR_DEPARTMENT_OTHER): Payer: Self-pay

## 2024-02-25 DIAGNOSIS — Z8673 Personal history of transient ischemic attack (TIA), and cerebral infarction without residual deficits: Secondary | ICD-10-CM

## 2024-02-25 DIAGNOSIS — N3000 Acute cystitis without hematuria: Secondary | ICD-10-CM | POA: Diagnosis not present

## 2024-02-25 DIAGNOSIS — E876 Hypokalemia: Secondary | ICD-10-CM | POA: Diagnosis present

## 2024-02-25 DIAGNOSIS — G4089 Other seizures: Secondary | ICD-10-CM | POA: Diagnosis present

## 2024-02-25 DIAGNOSIS — Z823 Family history of stroke: Secondary | ICD-10-CM

## 2024-02-25 DIAGNOSIS — Z85820 Personal history of malignant melanoma of skin: Secondary | ICD-10-CM

## 2024-02-25 DIAGNOSIS — N1831 Chronic kidney disease, stage 3a: Secondary | ICD-10-CM | POA: Diagnosis present

## 2024-02-25 DIAGNOSIS — Z79899 Other long term (current) drug therapy: Secondary | ICD-10-CM | POA: Diagnosis not present

## 2024-02-25 DIAGNOSIS — Z7902 Long term (current) use of antithrombotics/antiplatelets: Secondary | ICD-10-CM | POA: Diagnosis not present

## 2024-02-25 DIAGNOSIS — I130 Hypertensive heart and chronic kidney disease with heart failure and stage 1 through stage 4 chronic kidney disease, or unspecified chronic kidney disease: Secondary | ICD-10-CM | POA: Diagnosis present

## 2024-02-25 DIAGNOSIS — G238 Other specified degenerative diseases of basal ganglia: Secondary | ICD-10-CM | POA: Diagnosis not present

## 2024-02-25 DIAGNOSIS — G9341 Metabolic encephalopathy: Secondary | ICD-10-CM | POA: Diagnosis present

## 2024-02-25 DIAGNOSIS — Z833 Family history of diabetes mellitus: Secondary | ICD-10-CM

## 2024-02-25 DIAGNOSIS — Z86711 Personal history of pulmonary embolism: Secondary | ICD-10-CM

## 2024-02-25 DIAGNOSIS — R197 Diarrhea, unspecified: Secondary | ICD-10-CM | POA: Diagnosis not present

## 2024-02-25 DIAGNOSIS — I69351 Hemiplegia and hemiparesis following cerebral infarction affecting right dominant side: Secondary | ICD-10-CM

## 2024-02-25 DIAGNOSIS — Z87891 Personal history of nicotine dependence: Secondary | ICD-10-CM

## 2024-02-25 DIAGNOSIS — E1165 Type 2 diabetes mellitus with hyperglycemia: Secondary | ICD-10-CM | POA: Diagnosis present

## 2024-02-25 DIAGNOSIS — Z955 Presence of coronary angioplasty implant and graft: Secondary | ICD-10-CM | POA: Diagnosis not present

## 2024-02-25 DIAGNOSIS — G894 Chronic pain syndrome: Secondary | ICD-10-CM | POA: Diagnosis present

## 2024-02-25 DIAGNOSIS — K5730 Diverticulosis of large intestine without perforation or abscess without bleeding: Secondary | ICD-10-CM | POA: Diagnosis not present

## 2024-02-25 DIAGNOSIS — Z8249 Family history of ischemic heart disease and other diseases of the circulatory system: Secondary | ICD-10-CM

## 2024-02-25 DIAGNOSIS — Z90710 Acquired absence of both cervix and uterus: Secondary | ICD-10-CM

## 2024-02-25 DIAGNOSIS — I4729 Other ventricular tachycardia: Secondary | ICD-10-CM

## 2024-02-25 DIAGNOSIS — R4182 Altered mental status, unspecified: Secondary | ICD-10-CM

## 2024-02-25 DIAGNOSIS — J439 Emphysema, unspecified: Secondary | ICD-10-CM | POA: Diagnosis present

## 2024-02-25 DIAGNOSIS — Z83438 Family history of other disorder of lipoprotein metabolism and other lipidemia: Secondary | ICD-10-CM

## 2024-02-25 DIAGNOSIS — I480 Paroxysmal atrial fibrillation: Secondary | ICD-10-CM | POA: Diagnosis present

## 2024-02-25 DIAGNOSIS — M353 Polymyalgia rheumatica: Secondary | ICD-10-CM | POA: Diagnosis present

## 2024-02-25 DIAGNOSIS — I472 Ventricular tachycardia, unspecified: Secondary | ICD-10-CM | POA: Diagnosis present

## 2024-02-25 DIAGNOSIS — I5022 Chronic systolic (congestive) heart failure: Secondary | ICD-10-CM | POA: Diagnosis present

## 2024-02-25 DIAGNOSIS — E1122 Type 2 diabetes mellitus with diabetic chronic kidney disease: Secondary | ICD-10-CM | POA: Diagnosis present

## 2024-02-25 DIAGNOSIS — K219 Gastro-esophageal reflux disease without esophagitis: Secondary | ICD-10-CM | POA: Diagnosis present

## 2024-02-25 DIAGNOSIS — R338 Other retention of urine: Secondary | ICD-10-CM | POA: Diagnosis not present

## 2024-02-25 DIAGNOSIS — K769 Liver disease, unspecified: Secondary | ICD-10-CM | POA: Diagnosis not present

## 2024-02-25 DIAGNOSIS — I110 Hypertensive heart disease with heart failure: Secondary | ICD-10-CM

## 2024-02-25 DIAGNOSIS — N390 Urinary tract infection, site not specified: Secondary | ICD-10-CM

## 2024-02-25 DIAGNOSIS — I708 Atherosclerosis of other arteries: Secondary | ICD-10-CM | POA: Diagnosis not present

## 2024-02-25 DIAGNOSIS — R109 Unspecified abdominal pain: Secondary | ICD-10-CM | POA: Diagnosis not present

## 2024-02-25 DIAGNOSIS — E785 Hyperlipidemia, unspecified: Secondary | ICD-10-CM | POA: Diagnosis present

## 2024-02-25 LAB — PROTIME-INR
INR: 1 (ref 0.8–1.2)
Prothrombin Time: 13.4 s (ref 11.4–15.2)

## 2024-02-25 LAB — CBC WITH DIFFERENTIAL/PLATELET
Abs Immature Granulocytes: 0.05 K/uL (ref 0.00–0.07)
Basophils Absolute: 0 K/uL (ref 0.0–0.1)
Basophils Relative: 1 %
Eosinophils Absolute: 0.1 K/uL (ref 0.0–0.5)
Eosinophils Relative: 1 %
HCT: 44.7 % (ref 36.0–46.0)
Hemoglobin: 16.1 g/dL — ABNORMAL HIGH (ref 12.0–15.0)
Immature Granulocytes: 1 %
Lymphocytes Relative: 15 %
Lymphs Abs: 0.9 K/uL (ref 0.7–4.0)
MCH: 33.7 pg (ref 26.0–34.0)
MCHC: 36 g/dL (ref 30.0–36.0)
MCV: 93.5 fL (ref 80.0–100.0)
Monocytes Absolute: 0.7 K/uL (ref 0.1–1.0)
Monocytes Relative: 11 %
Neutro Abs: 4.3 K/uL (ref 1.7–7.7)
Neutrophils Relative %: 71 %
Platelets: 269 K/uL (ref 150–400)
RBC: 4.78 MIL/uL (ref 3.87–5.11)
RDW: 11.9 % (ref 11.5–15.5)
WBC: 6 K/uL (ref 4.0–10.5)
nRBC: 0 % (ref 0.0–0.2)

## 2024-02-25 LAB — COMPREHENSIVE METABOLIC PANEL WITH GFR
ALT: 14 U/L (ref 0–44)
AST: 30 U/L (ref 15–41)
Albumin: 4.4 g/dL (ref 3.5–5.0)
Alkaline Phosphatase: 96 U/L (ref 38–126)
Anion gap: 15 (ref 5–15)
BUN: 16 mg/dL (ref 8–23)
CO2: 26 mmol/L (ref 22–32)
Calcium: 8.7 mg/dL — ABNORMAL LOW (ref 8.9–10.3)
Chloride: 99 mmol/L (ref 98–111)
Creatinine, Ser: 0.85 mg/dL (ref 0.44–1.00)
GFR, Estimated: >60 mL/min (ref >60)
Glucose, Bld: 154 mg/dL — ABNORMAL HIGH (ref 70–99)
Potassium: 3.2 mmol/L — ABNORMAL LOW (ref 3.5–5.1)
Sodium: 141 mmol/L (ref 135–145)
Total Bilirubin: 0.6 mg/dL (ref 0.0–1.2)
Total Protein: 7 g/dL (ref 6.5–8.1)

## 2024-02-25 LAB — LACTIC ACID, PLASMA: Lactic Acid, Venous: 1 mmol/L (ref 0.5–1.9)

## 2024-02-25 MED ORDER — IOHEXOL 350 MG/ML SOLN
100.0000 mL | Freq: Once | INTRAVENOUS | Status: AC | PRN
  Administered 2024-02-26: 100 mL via INTRAVENOUS

## 2024-02-25 MED ORDER — ONDANSETRON HCL 4 MG/2ML IJ SOLN
4.0000 mg | Freq: Once | INTRAMUSCULAR | Status: AC
  Administered 2024-02-26: 4 mg via INTRAVENOUS
  Filled 2024-02-25: qty 2

## 2024-02-25 MED ORDER — PIPERACILLIN-TAZOBACTAM 3.375 G IVPB 30 MIN
3.3750 g | Freq: Once | INTRAVENOUS | Status: AC
  Administered 2024-02-25: 3.375 g via INTRAVENOUS
  Filled 2024-02-25: qty 50

## 2024-02-25 MED ORDER — FUROSEMIDE 40 MG PO TABS: 40.0000 mg | ORAL_TABLET | Freq: Two times a day (BID) | ORAL | 2 refills | Status: DC

## 2024-02-25 MED ORDER — LACTATED RINGERS IV BOLUS
1000.0000 mL | Freq: Once | INTRAVENOUS | Status: AC
  Administered 2024-02-25: 1000 mL via INTRAVENOUS

## 2024-02-25 NOTE — ED Notes (Signed)
 Patient offered bed pan to obtain urine sample but states she doesn't have to urinate at this time. Offered to obtain by straight cath but patient refuses.

## 2024-02-25 NOTE — Telephone Encounter (Signed)
Spoke with patient, verbalized understanding and had no questions at this time.  

## 2024-02-25 NOTE — ED Triage Notes (Signed)
 Diarrhea since Wednesday. Pt started vomiting today. Pt unable to hold anything down.

## 2024-02-25 NOTE — ED Notes (Signed)
 Pt called out to use the bathroom and a potty chair was taken in with a hat. Pt had a little bit of stool in the hat but no urine. Pt is requesting ivs taken out of arms. RN Curtistine to be informed

## 2024-02-25 NOTE — Progress Notes (Signed)
 ED Pharmacy Antibiotic Sign Off An antibiotic consult was received from an ED provider for Zosyn  per pharmacy dosing for intra-abd infection. A chart review was completed to assess appropriateness.   The following one time order(s) were placed:  Zosyn  3.375gm IV once  Further antibiotic and/or antibiotic pharmacy consults should be ordered by the admitting provider if indicated.   Thank you for allowing pharmacy to be a part of this patient's care.   Vito Ralph, PharmD, BCPS Please see amion for complete clinical pharmacist phone list 02/25/24 10:19 PM

## 2024-02-26 ENCOUNTER — Emergency Department (HOSPITAL_BASED_OUTPATIENT_CLINIC_OR_DEPARTMENT_OTHER)

## 2024-02-26 DIAGNOSIS — R197 Diarrhea, unspecified: Secondary | ICD-10-CM

## 2024-02-26 DIAGNOSIS — I739 Peripheral vascular disease, unspecified: Secondary | ICD-10-CM | POA: Insufficient documentation

## 2024-02-26 DIAGNOSIS — G9341 Metabolic encephalopathy: Secondary | ICD-10-CM | POA: Diagnosis present

## 2024-02-26 DIAGNOSIS — I130 Hypertensive heart and chronic kidney disease with heart failure and stage 1 through stage 4 chronic kidney disease, or unspecified chronic kidney disease: Secondary | ICD-10-CM | POA: Diagnosis not present

## 2024-02-26 DIAGNOSIS — N3000 Acute cystitis without hematuria: Secondary | ICD-10-CM

## 2024-02-26 DIAGNOSIS — R109 Unspecified abdominal pain: Secondary | ICD-10-CM | POA: Diagnosis not present

## 2024-02-26 DIAGNOSIS — I472 Ventricular tachycardia, unspecified: Secondary | ICD-10-CM | POA: Diagnosis not present

## 2024-02-26 DIAGNOSIS — I480 Paroxysmal atrial fibrillation: Secondary | ICD-10-CM | POA: Diagnosis not present

## 2024-02-26 DIAGNOSIS — G4089 Other seizures: Secondary | ICD-10-CM | POA: Diagnosis not present

## 2024-02-26 DIAGNOSIS — J439 Emphysema, unspecified: Secondary | ICD-10-CM | POA: Diagnosis not present

## 2024-02-26 DIAGNOSIS — R4182 Altered mental status, unspecified: Secondary | ICD-10-CM | POA: Diagnosis not present

## 2024-02-26 DIAGNOSIS — I708 Atherosclerosis of other arteries: Secondary | ICD-10-CM | POA: Diagnosis not present

## 2024-02-26 DIAGNOSIS — I5022 Chronic systolic (congestive) heart failure: Secondary | ICD-10-CM | POA: Diagnosis not present

## 2024-02-26 DIAGNOSIS — N1831 Chronic kidney disease, stage 3a: Secondary | ICD-10-CM | POA: Diagnosis not present

## 2024-02-26 DIAGNOSIS — N390 Urinary tract infection, site not specified: Secondary | ICD-10-CM

## 2024-02-26 DIAGNOSIS — G238 Other specified degenerative diseases of basal ganglia: Secondary | ICD-10-CM | POA: Diagnosis not present

## 2024-02-26 DIAGNOSIS — K769 Liver disease, unspecified: Secondary | ICD-10-CM | POA: Diagnosis not present

## 2024-02-26 DIAGNOSIS — E1122 Type 2 diabetes mellitus with diabetic chronic kidney disease: Secondary | ICD-10-CM | POA: Diagnosis not present

## 2024-02-26 DIAGNOSIS — M5432 Sciatica, left side: Secondary | ICD-10-CM | POA: Insufficient documentation

## 2024-02-26 DIAGNOSIS — Z0000 Encounter for general adult medical examination without abnormal findings: Secondary | ICD-10-CM | POA: Insufficient documentation

## 2024-02-26 DIAGNOSIS — K5730 Diverticulosis of large intestine without perforation or abscess without bleeding: Secondary | ICD-10-CM | POA: Diagnosis not present

## 2024-02-26 LAB — URINALYSIS, W/ REFLEX TO CULTURE (INFECTION SUSPECTED)
Bilirubin Urine: NEGATIVE
Glucose, UA: NEGATIVE mg/dL
Ketones, ur: NEGATIVE mg/dL
Nitrite: POSITIVE — AB
Protein, ur: 100 mg/dL — AB
Specific Gravity, Urine: 1.015 (ref 1.005–1.030)
WBC, UA: >50 WBC/hpf (ref 0–5)
pH: 7 (ref 5.0–8.0)

## 2024-02-26 LAB — HEMOGLOBIN A1C
Hgb A1c MFr Bld: 5.4 % (ref 4.8–5.6)
Mean Plasma Glucose: 108.28 mg/dL

## 2024-02-26 LAB — LACTIC ACID, PLASMA: Lactic Acid, Venous: 1.2 mmol/L (ref 0.5–1.9)

## 2024-02-26 MED ORDER — IPRATROPIUM-ALBUTEROL 0.5-2.5 (3) MG/3ML IN SOLN: 3.0000 mL | Freq: Four times a day (QID) | RESPIRATORY_TRACT | Status: DC | PRN

## 2024-02-26 MED ORDER — POTASSIUM CHLORIDE 10 MEQ/100ML IV SOLN
10.0000 meq | INTRAVENOUS | Status: AC
Start: 2024-02-26 — End: 2024-02-26
  Administered 2024-02-26 (×2): 10 meq via INTRAVENOUS
  Filled 2024-02-26 (×2): qty 100

## 2024-02-26 MED ORDER — DICYCLOMINE HCL 20 MG PO TABS
20.0000 mg | ORAL_TABLET | Freq: Three times a day (TID) | ORAL | Status: DC
  Administered 2024-02-26 – 2024-02-28 (×8): 20 mg via ORAL
  Filled 2024-02-26 (×10): qty 1

## 2024-02-26 MED ORDER — BUDESON-GLYCOPYRROL-FORMOTEROL 160-9-4.8 MCG/ACT IN AERO
2.0000 | INHALATION_SPRAY | Freq: Two times a day (BID) | RESPIRATORY_TRACT | Status: DC
  Administered 2024-02-26 – 2024-02-28 (×5): 2 via RESPIRATORY_TRACT
  Filled 2024-02-26: qty 5.9

## 2024-02-26 MED ORDER — HALOPERIDOL LACTATE 5 MG/ML IJ SOLN
2.0000 mg | Freq: Four times a day (QID) | INTRAMUSCULAR | Status: DC | PRN
  Administered 2024-02-26: 2 mg via INTRAVENOUS
  Filled 2024-02-26: qty 1

## 2024-02-26 MED ORDER — LORAZEPAM 2 MG/ML IJ SOLN: 1.0000 mg | Freq: Four times a day (QID) | INTRAMUSCULAR | Status: DC | PRN

## 2024-02-26 MED ORDER — GABAPENTIN 400 MG PO CAPS
400.0000 mg | ORAL_CAPSULE | Freq: Three times a day (TID) | ORAL | Status: DC
  Administered 2024-02-26 – 2024-02-28 (×6): 400 mg via ORAL
  Filled 2024-02-26 (×6): qty 1

## 2024-02-26 MED ORDER — ONDANSETRON HCL 4 MG/2ML IJ SOLN: 4.0000 mg | Freq: Four times a day (QID) | INTRAMUSCULAR | Status: DC | PRN

## 2024-02-26 MED ORDER — CITALOPRAM HYDROBROMIDE 20 MG PO TABS
10.0000 mg | ORAL_TABLET | Freq: Every day | ORAL | Status: DC
  Administered 2024-02-26 – 2024-02-28 (×3): 10 mg via ORAL
  Filled 2024-02-26 (×3): qty 1

## 2024-02-26 MED ORDER — AMLODIPINE BESYLATE 5 MG PO TABS
2.5000 mg | ORAL_TABLET | Freq: Every day | ORAL | Status: DC
  Administered 2024-02-26 – 2024-02-28 (×3): 2.5 mg via ORAL
  Filled 2024-02-26 (×3): qty 1

## 2024-02-26 MED ORDER — LEVETIRACETAM 500 MG PO TABS
500.0000 mg | ORAL_TABLET | Freq: Two times a day (BID) | ORAL | Status: DC
  Administered 2024-02-26 – 2024-02-28 (×5): 500 mg via ORAL
  Filled 2024-02-26 (×5): qty 1

## 2024-02-26 MED ORDER — SODIUM CHLORIDE 0.9 % IV SOLN
1.0000 g | Freq: Once | INTRAVENOUS | Status: AC
  Administered 2024-02-26: 1 g via INTRAVENOUS
  Filled 2024-02-26: qty 10

## 2024-02-26 MED ORDER — ENOXAPARIN SODIUM 40 MG/0.4ML IJ SOSY
40.0000 mg | PREFILLED_SYRINGE | INTRAMUSCULAR | Status: DC
  Administered 2024-02-26 – 2024-02-28 (×3): 40 mg via SUBCUTANEOUS
  Filled 2024-02-26 (×3): qty 0.4

## 2024-02-26 MED ORDER — SODIUM CHLORIDE 0.9 % IV SOLN
INTRAVENOUS | Status: DC | PRN
  Administered 2024-02-26: 250 mL via INTRAVENOUS

## 2024-02-26 MED ORDER — RANOLAZINE ER 500 MG PO TB12
500.0000 mg | ORAL_TABLET | Freq: Two times a day (BID) | ORAL | Status: DC
  Administered 2024-02-26 – 2024-02-28 (×5): 500 mg via ORAL
  Filled 2024-02-26 (×6): qty 1

## 2024-02-26 MED ORDER — NITROGLYCERIN 0.4 MG SL SUBL
0.4000 mg | SUBLINGUAL_TABLET | SUBLINGUAL | Status: DC | PRN
Start: 2024-02-26 — End: 2024-02-28

## 2024-02-26 MED ORDER — PANTOPRAZOLE SODIUM 40 MG PO TBEC
40.0000 mg | DELAYED_RELEASE_TABLET | Freq: Every day | ORAL | Status: DC
  Administered 2024-02-26 – 2024-02-28 (×3): 40 mg via ORAL
  Filled 2024-02-26 (×3): qty 1

## 2024-02-26 MED ORDER — DILTIAZEM HCL ER COATED BEADS 120 MG PO CP24
120.0000 mg | ORAL_CAPSULE | Freq: Every day | ORAL | Status: DC
  Administered 2024-02-26 – 2024-02-28 (×3): 120 mg via ORAL
  Filled 2024-02-26 (×3): qty 1

## 2024-02-26 MED ORDER — ATORVASTATIN CALCIUM 80 MG PO TABS
80.0000 mg | ORAL_TABLET | Freq: Every day | ORAL | Status: DC
  Administered 2024-02-26 – 2024-02-27 (×2): 80 mg via ORAL
  Filled 2024-02-26 (×2): qty 1

## 2024-02-26 MED ORDER — HALOPERIDOL LACTATE 5 MG/ML IJ SOLN
1.0000 mg | Freq: Four times a day (QID) | INTRAMUSCULAR | Status: DC | PRN
  Administered 2024-02-26: 1 mg via INTRAVENOUS
  Filled 2024-02-26: qty 1

## 2024-02-26 MED ORDER — HALOPERIDOL LACTATE 5 MG/ML IJ SOLN
2.0000 mg | Freq: Once | INTRAMUSCULAR | Status: AC
  Administered 2024-02-26: 2 mg via INTRAVENOUS
  Filled 2024-02-26: qty 1

## 2024-02-26 MED ORDER — SODIUM CHLORIDE 0.9 % IV SOLN
1.0000 g | INTRAVENOUS | Status: DC
  Administered 2024-02-27 – 2024-02-28 (×2): 1 g via INTRAVENOUS
  Filled 2024-02-26 (×2): qty 10

## 2024-02-26 MED ORDER — MORPHINE SULFATE ER 15 MG PO TBCR
15.0000 mg | EXTENDED_RELEASE_TABLET | Freq: Two times a day (BID) | ORAL | Status: DC
  Administered 2024-02-26 – 2024-02-28 (×5): 15 mg via ORAL
  Filled 2024-02-26 (×5): qty 1

## 2024-02-26 MED ORDER — LACTATED RINGERS IV SOLN: INTRAVENOUS | Status: DC

## 2024-02-26 MED ORDER — PHENAZOPYRIDINE HCL 200 MG PO TABS
200.0000 mg | ORAL_TABLET | Freq: Three times a day (TID) | ORAL | Status: AC
Start: 2024-02-26 — End: 2024-02-28
  Administered 2024-02-26 – 2024-02-28 (×6): 200 mg via ORAL
  Filled 2024-02-26 (×7): qty 1

## 2024-02-26 MED ORDER — CLOPIDOGREL BISULFATE 75 MG PO TABS
75.0000 mg | ORAL_TABLET | Freq: Every day | ORAL | Status: DC
  Administered 2024-02-26 – 2024-02-28 (×3): 75 mg via ORAL
  Filled 2024-02-26 (×3): qty 1

## 2024-02-26 MED ORDER — POTASSIUM CHLORIDE CRYS ER 20 MEQ PO TBCR
20.0000 meq | EXTENDED_RELEASE_TABLET | Freq: Two times a day (BID) | ORAL | Status: DC
  Administered 2024-02-26: 20 meq via ORAL
  Filled 2024-02-26: qty 1

## 2024-02-26 NOTE — Assessment & Plan Note (Signed)
-   No diarrhea since admission and therefore not able to obtain stool studies -Okay to discontinue enteric precautions once C. difficile testing time period expires

## 2024-02-26 NOTE — Assessment & Plan Note (Signed)
-   Patient presented with altered mentation and UA suggestive of infection - Continue Rocephin  - Urine culture inconclusive.  Will plan to complete Rocephin  course on 11/24

## 2024-02-26 NOTE — Progress Notes (Signed)
 Progress Note    Donna Little   FMW:[id number]  DOB: [birth year]  DOA: 02/25/2024     0 PCP: Donna Clapper, MD  Initial CC: AMS, diarrhea  Hospital Course: Donna Little is an 78 year old female with PMH atrial fibrillation, chronic pain syndrome, chronic systolic heart failure, depression, GERD, emphysema and hypertension who presented to the hospital with complaints of diarrhea and abd pain.  Symptoms began around Thursday and had associated vomiting as well. There is also reports of patient being confused.  In the ER, BP 157/89, HR 91, RR 17, O2 saturation 96% on room air,  and Tmax 98.2. Cbc unremarkable. Chemistry demonstrated Na 141, K 3.2, Cl 99, bicarb 6, Bun/Cr 16/0.85 and glucose 154. Anion gap 15. Urinalysis demonstrated large leukocytes esterase and nitrates along with many bacteria and greater than 50 white blood cells.  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  No CT evidence of acute mesenteric ischemia.  Diffuse aortic atherosclerosis without aneurysmal dilatation, small juxtarenal mural thrombus that is not flow-limiting.  Stable hypervascular liver lesions consistent with hemangiomas.  CT head was negative for any acute intracranial abnormalities. EKG was not performed.  Interval History:  Still confused this morning.  Sitter is present bedside.  Has abdominal pain as well.  Diarrhea persists.  Assessment and Plan: * Acute metabolic encephalopathy - Suspected due to UTI on admission - Continue antibiotics and monitoring mentation  Diarrhea - Checking C. difficile and GI pathogen panel - Continue enteric precautions  UTI (urinary tract infection) - Patient presented with altered mentation and UA suggestive of infection - Continue Rocephin  - Follow-up urine culture  PAF (paroxysmal atrial fibrillation) (HCC) - Only on Plavix  at this  time -Continue Cardizem   History of CVA (cerebrovascular accident) - Continue Plavix  and Lipitor  Hypokalemia - Replete as needed  GERD (gastroesophageal reflux disease) - Continue Protonix   Hyperlipidemia - Continue Lipitor    Antimicrobials: Rocephin  02/26/2024 >> current  DVT prophylaxis:  enoxaparin  (LOVENOX ) injection 40 mg Start: 02/26/24 1000   Code Status:   Code Status: Full Code  Mobility Assessment (Last 72 Hours)     Mobility Assessment     Row Name 02/26/24 0520           Does the patient have exclusion criteria? No - Perform mobility assessment       What is the highest level of mobility based on the mobility assessment? Level 4 (Ambulates with assistance) - Balance while stepping forward/back - Complete          Diet: Diet Orders (From admission, onward)     Start     Ordered   02/26/24 0225  Diet Heart Room service appropriate? Yes; Fluid consistency: Thin  Diet effective now       Question Answer Comment  Room service appropriate? Yes   Fluid consistency: Thin      02/26/24 0224            Barriers to discharge: None Disposition Plan: TBD HH orders placed: TBD Status is: Inpatient  Objective: Blood pressure (!) 161/86, pulse 95, temperature 98.1 F (36.7 C), temperature source Oral, resp. rate 18, height 5' 1.5 (1.562 m), weight 54.4 kg, SpO2 97%.  Examination:  Physical Exam Constitutional:      Appearance: Normal appearance.  HENT:     Head: Normocephalic and atraumatic.     Mouth/Throat:  Mouth: Mucous membranes are moist.  Eyes:     Extraocular Movements: Extraocular movements intact.  Cardiovascular:     Rate and Rhythm: Normal rate and regular rhythm.  Pulmonary:     Effort: Pulmonary effort is normal. No respiratory distress.     Breath sounds: Normal breath sounds. No wheezing.  Abdominal:     General: Bowel sounds are normal. There is no distension.     Palpations: Abdomen is soft.     Tenderness: There  is generalized abdominal tenderness.  Musculoskeletal:        General: Normal range of motion.     Cervical back: Normal range of motion and neck supple.  Skin:    General: Skin is warm and dry.  Neurological:     Mental Status: She is alert. She is disoriented.      Consultants:    Procedures:    Data Reviewed: Results for orders placed or performed during the hospital encounter of 02/25/24 (from the past 24 hours)  Comprehensive metabolic panel     Status: Abnormal   Collection Time: 02/25/24 10:09 PM  Result Value Ref Range   Sodium 141 135 - 145 mmol/L   Potassium 3.2 (L) 3.5 - 5.1 mmol/L   Chloride 99 98 - 111 mmol/L   CO2 26 22 - 32 mmol/L   Glucose, Bld 154 (H) 70 - 99 mg/dL   BUN 16 8 - 23 mg/dL   Creatinine, Ser 9.14 0.44 - 1.00 mg/dL   Calcium  8.7 (L) 8.9 - 10.3 mg/dL   Total Protein 7.0 6.5 - 8.1 g/dL   Albumin 4.4 3.5 - 5.0 g/dL   AST 30 15 - 41 U/L   ALT 14 0 - 44 U/L   Alkaline Phosphatase 96 38 - 126 U/L   Total Bilirubin 0.6 0.0 - 1.2 mg/dL   GFR, Estimated >39 >39 mL/min   Anion gap 15 5 - 15  Lactic acid, plasma     Status: None   Collection Time: 02/25/24 10:09 PM  Result Value Ref Range   Lactic Acid, Venous 1.0 0.5 - 1.9 mmol/L  CBC with Differential     Status: Abnormal   Collection Time: 02/25/24 10:09 PM  Result Value Ref Range   WBC 6.0 4.0 - 10.5 K/uL   RBC 4.78 3.87 - 5.11 MIL/uL   Hemoglobin 16.1 (H) 12.0 - 15.0 g/dL   HCT 55.2 63.9 - 53.9 %   MCV 93.5 80.0 - 100.0 fL   MCH 33.7 26.0 - 34.0 pg   MCHC 36.0 30.0 - 36.0 g/dL   RDW 88.0 88.4 - 84.4 %   Platelets 269 150 - 400 K/uL   nRBC 0.0 0.0 - 0.2 %   Neutrophils Relative % 71 %   Neutro Abs 4.3 1.7 - 7.7 K/uL   Lymphocytes Relative 15 %   Lymphs Abs 0.9 0.7 - 4.0 K/uL   Monocytes Relative 11 %   Monocytes Absolute 0.7 0.1 - 1.0 K/uL   Eosinophils Relative 1 %   Eosinophils Absolute 0.1 0.0 - 0.5 K/uL   Basophils Relative 1 %   Basophils Absolute 0.0 0.0 - 0.1 K/uL    Immature Granulocytes 1 %   Abs Immature Granulocytes 0.05 0.00 - 0.07 K/uL  Protime-INR     Status: None   Collection Time: 02/25/24 10:09 PM  Result Value Ref Range   Prothrombin Time 13.4 11.4 - 15.2 seconds   INR 1.0 0.8 - 1.2  Urinalysis, w/ Reflex to Culture (  Infection Suspected) -Urine, Clean Catch     Status: Abnormal   Collection Time: 02/26/24 12:03 AM  Result Value Ref Range   Specimen Source URINE, CATHETERIZED    Color, Urine YELLOW YELLOW   APPearance CLOUDY (A) CLEAR   Specific Gravity, Urine 1.015 1.005 - 1.030   pH 7.0 5.0 - 8.0   Glucose, UA NEGATIVE NEGATIVE mg/dL   Hgb urine dipstick TRACE (A) NEGATIVE   Bilirubin Urine NEGATIVE NEGATIVE   Ketones, ur NEGATIVE NEGATIVE mg/dL   Protein, ur 899 (A) NEGATIVE mg/dL   Nitrite POSITIVE (A) NEGATIVE   Leukocytes,Ua LARGE (A) NEGATIVE   Squamous Epithelial / HPF 0-5 0 - 5 /HPF   WBC, UA >50 0 - 5 WBC/hpf   RBC / HPF 0-5 0 - 5 RBC/hpf   Bacteria, UA MANY (A) NONE SEEN  Lactic acid, plasma     Status: None   Collection Time: 02/26/24 12:48 AM  Result Value Ref Range   Lactic Acid, Venous 1.2 0.5 - 1.9 mmol/L  Hemoglobin A1c     Status: None   Collection Time: 02/26/24  7:57 AM  Result Value Ref Range   Hgb A1c MFr Bld 5.4 4.8 - 5.6 %   Mean Plasma Glucose 108.28 mg/dL    I have reviewed pertinent nursing notes, vitals, labs, and images as necessary. I have ordered labwork to follow up on as indicated.  I have reviewed the last notes from staff over past 24 hours. I have discussed patient's care plan and test results with nursing staff, CM/SW, and other staff as appropriate.  Old records reviewed in assessment of this patient  Time spent: Greater than 50% of the 55 minute visit was spent in counseling/coordination of care for the patient as laid out in the A&P.   LOS: 0 days   Alm Apo, MD [facility] 02/26/2024, 1:04 PM

## 2024-02-26 NOTE — Assessment & Plan Note (Signed)
Recommend calcium with D.  continue prolia every 6 months.

## 2024-02-26 NOTE — Plan of Care (Signed)

## 2024-02-26 NOTE — Plan of Care (Signed)
 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 Patient has been brought up to the ED by patient's son for concern for confusion in the setting of diarrhea and vomiting since Wednesday.  At presentation to ED patient found borderline hypertensive otherwise hemodynamically stable. Lab, CBC unremarkable.  CMP showing low potassium 3.2 and low calcium  8.7 otherwise unremarkable.  Normal lactic acid level.  Blood cultures are in process.  UA showed evidence of UTI.  CT head no acute intracranial abnormality.  CT angio abdomen pelvis: 1. 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 No CT evidence of acute mesenteric ischemia. 2. Diffuse aortic atherosclerosis without aneurysmal dilatation, with small juxtarenal mural thrombus that is not flow limiting. No dissection. 3. Stable hypervascular liver lesions consistent with hemangiomas.  In the ED patient received ceftriaxone , Zosyn  and 1 L of LR bolus.   Hospitalist consulted for further evaluation management of acute metabolic encephalopathy  in terms of confusion confusion in the setting of acute cystitis and diarrhea.      [facility] will assume care on arrival to accepting facility. Until arrival, care as per EDP. However, [facility] available 24/7 for questions and assistance. Check [email or web address] for on-call coverage. Nursing staff, please call [facility] Admits & Consults System-Wide number under Amion on patient's arrival so appropriate admitting provider can  evaluate the pt.   Author: Kissie Ziolkowski, MD  [facility]

## 2024-02-26 NOTE — ED Notes (Signed)
 [facility] called for transport.

## 2024-02-26 NOTE — Hospital Course (Signed)
 Donna Little is an 78 year old female with PMH atrial fibrillation, chronic pain syndrome, chronic systolic heart failure, depression, GERD, emphysema and hypertension who presented to the hospital with complaints of diarrhea and abd pain.  Symptoms began around Thursday and had associated vomiting as well. There is also reports of patient being confused.  In the ER, BP 157/89, HR 91, RR 17, O2 saturation 96% on room air,  and Tmax 98.2. Cbc unremarkable. Chemistry demonstrated Na 141, K 3.2, Cl 99, bicarb 6, Bun/Cr 16/0.85 and glucose 154. Anion gap 15. Urinalysis demonstrated large leukocytes esterase and nitrates along with many bacteria and greater than 50 white blood cells.  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  No CT evidence of acute mesenteric ischemia.  Diffuse aortic atherosclerosis without aneurysmal dilatation, small juxtarenal mural thrombus that is not flow-limiting.  Stable hypervascular liver lesions consistent with hemangiomas.  CT head was negative for any acute intracranial abnormalities. EKG was not performed.

## 2024-02-26 NOTE — Assessment & Plan Note (Signed)
-

## 2024-02-26 NOTE — ED Notes (Signed)
 ED TO INPATIENT HANDOFF REPORT  ED Nurse Name and Phone #: Sharlet Lemmings RN [phone]  S Name/Age/Gender Donna Little 78 y.o. female Room/Bed: MH11/MH11  Code Status   Code Status: Prior  Home/SNF/Other Home Patient oriented to: self Is this baseline? No   Triage Complete: Triage complete  Chief Complaint Acute metabolic encephalopathy [G93.41]  Triage Note Diarrhea since Wednesday. Pt started vomiting today. Pt unable to hold anything down.   Allergies Allergies  Allergen Reactions   Codeine Other (See Comments)    It made me crazy   Macrobid  [Nitrofurantoin ] Nausea And Vomiting   Methocarbamol Other (See Comments)    Sleepy   Sertraline Other (See Comments)    Insomnia   Tizanidine Other (See Comments)    Sleepy    Level of Care/Admitting Diagnosis ED Disposition     ED Disposition  Admit   Condition  --   Comment  Hospital Area: [facility] [[id number]]  Level of Care: Telemetry [5]  May admit patient to [facility] or [facility] if equivalent level of care is available:: Yes  Interfacility transfer: Yes  Diagnosis: Acute metabolic encephalopathy [8430220]  Admitting Physician: SUNDIL, SUBRINA [[id number]]  Attending Physician: SUNDIL, SUBRINA [[id number]]  Certification:: I certify this patient will need inpatient services for at least 2 midnights  Expected Medical Readiness: 03/01/2024          B Medical/Surgery History Past Medical History:  Diagnosis Date   A-fib [facility])    Atherosclerosis of both carotid arteries 2022   noted per CT on 05/14/20   Chronic pain syndrome    Chronic systolic heart failure (HCC)    Depression    Emphysema of lung (HCC) 2022   noted per CT scan on 05/14/20   Generalized atherosclerosis    GERD (gastroesophageal reflux disease)    Hypertension    IBS (irritable bowel syndrome)    Melanoma (HCC)    Other specified nontoxic goiter    Palpitations    PMR (polymyalgia rheumatica)  07/04/2020   Polymyalgia rheumatica    Primary pulmonary hypertension (HCC)    Pulmonary embolism (HCC)    Stroke (HCC)    ministroke times 3 Right side weakness   Supraventricular tachycardia    Type 2 diabetes mellitus (HCC)    diet controlled   Past Surgical History:  Procedure Laterality Date   ABDOMINAL HYSTERECTOMY     APPENDECTOMY     CATARACT EXTRACTION     CORONARY ANGIOPLASTY WITH STENT PLACEMENT  2021   KYPHOPLASTY  05/2020   L4-L5   TRIGGER FINGER RELEASE Right 03/17/2023   Procedure: right middle and ring finger RELEASE TRIGGER FINGER/A-1 PULLEY;  Surgeon: Romona Harari, MD;  Location: [facility];  Service: Orthopedics;  Laterality: Right;  mac and local     A IV Location/Drains/Wounds Patient Lines/Drains/Airways Status     Active Line/Drains/Airways     Name Placement date Placement time Site Days   Peripheral IV 02/25/24 20 G 1 Left Antecubital 02/25/24  2224  Antecubital  1   Peripheral IV 02/25/24 20 G 1 Right Antecubital 02/25/24  2225  Antecubital  1            Intake/Output Last 24 hours  Intake/Output Summary (Last 24 hours) at 02/26/2024 0240 Last data filed at 02/26/2024 0222 Gross per 24 hour  Intake 1130.88 ml  Output 100 ml  Net 1030.88 ml    Labs/Imaging Results for orders placed or performed during the hospital encounter  of 02/25/24 (from the past 48 hours)  Comprehensive metabolic panel     Status: Abnormal   Collection Time: 02/25/24 10:09 PM  Result Value Ref Range   Sodium 141 135 - 145 mmol/L   Potassium 3.2 (L) 3.5 - 5.1 mmol/L   Chloride 99 98 - 111 mmol/L   CO2 26 22 - 32 mmol/L   Glucose, Bld 154 (H) 70 - 99 mg/dL    Comment: Glucose reference range applies only to samples taken after fasting for at least 8 hours.   BUN 16 8 - 23 mg/dL   Creatinine, Ser 9.14 0.44 - 1.00 mg/dL   Calcium  8.7 (L) 8.9 - 10.3 mg/dL   Total Protein 7.0 6.5 - 8.1 g/dL   Albumin 4.4 3.5 - 5.0 g/dL   AST 30 15 - 41 U/L    ALT 14 0 - 44 U/L   Alkaline Phosphatase 96 38 - 126 U/L   Total Bilirubin 0.6 0.0 - 1.2 mg/dL   GFR, Estimated >39 >39 mL/min    Comment: (NOTE) Calculated using the CKD-EPI Creatinine Equation (2021)    Anion gap 15 5 - 15    Comment: Performed at [facility], [address] [place], [place] [address]  Lactic acid, plasma     Status: None   Collection Time: 02/25/24 10:09 PM  Result Value Ref Range   Lactic Acid, Venous 1.0 0.5 - 1.9 mmol/L    Comment: Performed at [facility], [address] [place], [place] [address]  CBC with Differential     Status: Abnormal   Collection Time: 02/25/24 10:09 PM  Result Value Ref Range   WBC 6.0 4.0 - 10.5 K/uL   RBC 4.78 3.87 - 5.11 MIL/uL   Hemoglobin 16.1 (H) 12.0 - 15.0 g/dL   HCT 55.2 63.9 - 53.9 %   MCV 93.5 80.0 - 100.0 fL   MCH 33.7 26.0 - 34.0 pg   MCHC 36.0 30.0 - 36.0 g/dL   RDW 88.0 88.4 - 84.4 %   Platelets 269 150 - 400 K/uL   nRBC 0.0 0.0 - 0.2 %   Neutrophils Relative % 71 %   Neutro Abs 4.3 1.7 - 7.7 K/uL   Lymphocytes Relative 15 %   Lymphs Abs 0.9 0.7 - 4.0 K/uL   Monocytes Relative 11 %   Monocytes Absolute 0.7 0.1 - 1.0 K/uL   Eosinophils Relative 1 %   Eosinophils Absolute 0.1 0.0 - 0.5 K/uL   Basophils Relative 1 %   Basophils Absolute 0.0 0.0 - 0.1 K/uL   Immature Granulocytes 1 %   Abs Immature Granulocytes 0.05 0.00 - 0.07 K/uL    Comment: Performed at [facility], [address] [place], [place] [address]  Protime-INR     Status: None   Collection Time: 02/25/24 10:09 PM  Result Value Ref Range   Prothrombin Time 13.4 11.4 - 15.2 seconds   INR 1.0 0.8 - 1.2    Comment: (NOTE) INR goal varies based on device and disease states. Performed at [facility], [address] [place], [place] [address]   Urinalysis, w/ Reflex to Culture (Infection Suspected) -Urine, Clean Catch     Status: Abnormal   Collection Time: 02/26/24 12:03 AM  Result Value Ref  Range   Specimen Source URINE, CATHETERIZED    Color, Urine YELLOW YELLOW   APPearance CLOUDY (A) CLEAR   Specific Gravity, Urine 1.015 1.005 -  1.030   pH 7.0 5.0 - 8.0   Glucose, UA NEGATIVE NEGATIVE mg/dL   Hgb urine dipstick TRACE (A) NEGATIVE   Bilirubin Urine NEGATIVE NEGATIVE   Ketones, ur NEGATIVE NEGATIVE mg/dL   Protein, ur 899 (A) NEGATIVE mg/dL   Nitrite POSITIVE (A) NEGATIVE   Leukocytes,Ua LARGE (A) NEGATIVE   Squamous Epithelial / HPF 0-5 0 - 5 /HPF   WBC, UA >50 0 - 5 WBC/hpf    Comment: Reflex urine culture not performed if WBC <=10, OR if Squamous epithelial cells >5. If Squamous epithelial cells >5, suggest recollection.   RBC / HPF 0-5 0 - 5 RBC/hpf   Bacteria, UA MANY (A) NONE SEEN    Comment: Performed at [facility], [address] [place], [place] [address]  Lactic acid, plasma     Status: None   Collection Time: 02/26/24 12:48 AM  Result Value Ref Range   Lactic Acid, Venous 1.2 0.5 - 1.9 mmol/L    Comment: Performed at [facility], [address] [place], [place] [address]   CT Angio Abd/Pel W and/or Wo Contrast Result Date: 02/26/2024 EXAM: CTA ABDOMEN AND PELVIS WITH CONTRAST 02/26/2024 01:02:06 AM TECHNIQUE: CTA images of the abdomen and pelvis with intravenous contrast. 100 mL iohexol  (OMNIPAQUE ) 350 MG/ML injection was administered. Three-dimensional MIP/volume rendered formations were performed. Automated exposure control, iterative reconstruction, and/or weight based adjustment of the mA/kV was utilized to reduce the radiation dose to as low as reasonably achievable. COMPARISON: None available. CLINICAL HISTORY: Abdominal pain and history of sma stenosis on prior CT. FINDINGS: VASCULATURE: AORTA: Diffuse atherosclerotic calcifications are noted without aneurysmal dilatation. Some mural thrombus is seen in a juxtarenal position although it does not appear to be flow limiting. No dissection. CELIAC TRUNK: Widely patent. SUPERIOR  MESENTERIC ARTERY: Calcifications are noted at the origin of the SMA. A focal stenosis is noted approximately 15 mm beyond the sma origin secondary to soft atherosclerotic plaque. The previously seen abnormalities were felt to be more related to the timing of the contrast bolus as opposed to true high-grade stenosis of the SMA. The distal SMA shows normal branching. RENAL ARTERIES: Patent. Atherosclerotic calcification is present without aneurysmal dilatation. ILIAC ARTERIES: Atherosclerotic calcification is present without aneurysmal dilatation. VENOUS SYSTEM: No specific venous abnormality is seen. LIVER: The liver demonstrates 2 hypervascular lesions, 1 best seen on image 12 of series 21 and the second inferiorly on image 23 of series 21. The enhancement pattern is that consistent with hemangioma. These are stable in appearance from the prior exam. GALLBLADDER AND BILE DUCTS: The gallbladder is decompressed. No biliary ductal dilatation. SPLEEN: The spleen is unremarkable. PANCREAS: The pancreas is unremarkable. ADRENAL GLANDS: Bilateral adrenal glands demonstrate no acute abnormality. KIDNEYS, URETERS AND BLADDER: Kidneys demonstrated normal enhancement pattern bilaterally. No renal calculi or obstructive changes are seen. The bladder is well distended. GI AND BOWEL: Stomach and duodenal sweep demonstrate no acute abnormality. The small bowel is within normal limits. Colon shows mild diverticular change without evidence of diverticulitis. The appendix has been surgically removed. No obstructive changes are seen. REPRODUCTIVE: The uterus has been surgically removed. PERITONEUM AND RETRPERITONEUM: No free fluid is noted. No free air. LUNG BASE: Lung bases are free of acute infiltrate or sizable effusion. LYMPH NODES: No lymphadenopathy is seen. BONES AND SOFT TISSUES: No acute abnormality of the bones. No acute soft tissue abnormality. IMPRESSION: 1. 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 No CT evidence of acute mesenteric ischemia. 2. Diffuse aortic atherosclerosis without aneurysmal dilatation, with small juxtarenal mural thrombus that is not flow limiting. No dissection. 3. Stable hypervascular liver lesions consistent with hemangiomas. Electronically signed by: Oneil Devonshire MD 02/26/2024 01:15 AM EST RP Workstation: [id number]   CT Head Wo Contrast Result Date: 02/26/2024 EXAM: CT HEAD WITHOUT CONTRAST 02/26/2024 01:00:00 AM TECHNIQUE: CT of the head was performed without the administration of intravenous contrast. Automated exposure control, iterative reconstruction, and/or weight based adjustment of the mA/kV was utilized to reduce the radiation dose to as low as reasonably achievable. COMPARISON: None available. CLINICAL HISTORY: Altered mental status, nontraumatic (Ped 0-17y). FINDINGS: BRAIN AND VENTRICLES: Basal ganglia mineralization bilaterally. No acute hemorrhage. No evidence of acute infarct. Mild atrophic changes are noted commensurate with the patient's given age. No mass effect or midline shift. ORBITS: Bilateral lens replacement noted. SINUSES: No acute abnormality. SOFT TISSUES AND SKULL: No acute soft tissue abnormality. No skull fracture. IMPRESSION: 1. No acute intracranial abnormality. Electronically signed by: Oneil Devonshire MD 02/26/2024 01:06 AM EST RP Workstation: [id number]    Pending Labs Unresulted Labs (From admission, onward)     Start     Ordered   02/26/24 0221  Gastrointestinal Panel by PCR , Stool  (Gastrointestinal Panel by PCR, Stool                                                                                                                                                     **Does Not include CLOSTRIDIUM DIFFICILE testing. **If CDIFF testing is needed, place order from the C Difficile Testing order set.**)  Once,   URGENT        02/26/24 0220   02/26/24 0003  Urine  Culture  Once,   R        02/26/24 0003   02/25/24 2201  Culture, blood (Routine x 2)  BLOOD CULTURE X 2,   STAT      02/25/24 2200            Vitals/Pain Today's Vitals   02/25/24 2300 02/26/24 0000 02/26/24 0100 02/26/24 0130  BP: (!) 140/86 (!) 161/106 112/73 127/80  Pulse: 88 88 87   Resp:      Temp:      TempSrc:      SpO2: 97% 98% 96%   Weight:      Height:      PainSc:        Isolation Precautions Enteric precautions (UV disinfection)  Medications Medications  0.9 %  sodium chloride  infusion (0 mLs Intravenous Stopped 02/26/24 0222)  potassium chloride  10 mEq in 100 mL IVPB (10 mEq Intravenous New Bag/Given 02/26/24 0235)  ondansetron  (ZOFRAN ) injection 4 mg (has no administration in time range)  lactated ringers  infusion (  has no administration in time range)  lactated ringers  bolus 1,000 mL (0 mLs Intravenous Stopped 02/26/24 0010)  piperacillin -tazobactam (ZOSYN ) IVPB 3.375 g (0 g Intravenous Stopped 02/25/24 2303)  iohexol  (OMNIPAQUE ) 350 MG/ML injection 100 mL (100 mLs Intravenous Contrast Given 02/26/24 0030)  ondansetron  (ZOFRAN ) injection 4 mg (4 mg Intravenous Given 02/26/24 0016)  haloperidol  lactate (HALDOL ) injection 2 mg (2 mg Intravenous Given 02/26/24 0149)  cefTRIAXone  (ROCEPHIN ) 1 g in sodium chloride  0.9 % 100 mL IVPB (0 g Intravenous Stopped 02/26/24 0222)    Mobility walks with person assist     Focused Assessments Renal Assessment Handoff:  Hemodialysis Schedule:  Last Hemodialysis date and time:    Restricted appendage:   ,    R Recommendations: See Admitting Provider Note  Report given to:   Additional Notes:

## 2024-02-26 NOTE — Assessment & Plan Note (Signed)
 Replete as needed

## 2024-02-26 NOTE — Plan of Care (Signed)
  RN reported that patient is confused, climbing out of the bed and messing with her IV access. -Implemented [employer] Manager.

## 2024-02-26 NOTE — Assessment & Plan Note (Addendum)
 Non-healing ulcer on left lower extremity for at least a month. Sore, slightly run, no infection. Differential includes poor blood flow or nerve issues. - Ordered vascular study of legs. - Advised application of antibiotic ointment or Vaseline to ulcer. Orders:   VAS US  ABI WITH/WO TBI; Future

## 2024-02-26 NOTE — Assessment & Plan Note (Signed)
-   Suspected due to UTI on admission - Mentation improved back to normal with antibiotics

## 2024-02-26 NOTE — Assessment & Plan Note (Signed)
 Continue Plavix and Lipitor

## 2024-02-26 NOTE — Assessment & Plan Note (Signed)
-   Only on Plavix  at this time -Continue Cardizem

## 2024-02-26 NOTE — Assessment & Plan Note (Signed)
 -  Continue Lipitor

## 2024-02-26 NOTE — ED Notes (Addendum)
 Patient is alert to self, place  however needs redirection on time and situation. Forgetful. Son is at bedside. Easily redirected and pleasant.

## 2024-02-26 NOTE — ED Provider Notes (Signed)
 Care assumed at shift change. Here for diarrhea, vomiting, possibly some confusion. 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 Awaiting UA and CT scan.  Physical Exam  BP 127/80   Pulse 87   Temp 98.1 F (36.7 C) (Oral)   Resp 17   Ht 5' 1.5 (1.562 m)   Wt 54.4 kg   LMP  (LMP Unknown)   SpO2 96%   BMI 22.29 kg/m   Physical Exam  Procedures  Procedures  ED Course / MDM   Clinical Course as of 02/26/24 0243  Sat Feb 26, 2024  0023 Patient requesting nausea medication. UA collected by in-and-out cath with UTI, already give Zosyn . Will add culture. Awaiting CT.  [CS]  0118 I personally viewed the images from radiology studies and agree with radiologist interpretation: CT head and CTA abdomen pelvis without any acute findings.  [CS]  0136 Patient remains hemodynamically stable, but still confused per family at bedside, trying to get out of bed and pulling at IV. Will give a dose of haldol  for agitation and plan admission for further management. Will give a dose of Rocephin  given anticipated delay in getting an inpatient bed.  [CS]  [phone] Spoke with Dr. Sundil who will accept for admission.  [CS]    Clinical Course User Index [CS] Roselyn Carlin NOVAK, MD   Medical Decision Making Problems Addressed: Acute cystitis without hematuria: acute illness or injury Altered mental status, unspecified altered mental status type: acute illness or injury  Amount and/or Complexity of Data Reviewed Labs:  Decision-making details documented in ED Course. Radiology: independent interpretation performed. Decision-making details documented in ED Course.  Risk Prescription drug management. Decision regarding hospitalization.          Roselyn Carlin NOVAK, MD 02/26/24 [phone]

## 2024-02-26 NOTE — H&P (Addendum)
 History and Physical    Donna Little FMW:[id number] DOB: April 02, 1946 DOA: 02/25/2024  PCP: Sherre Clapper, MD Patient coming from: Home  Chief Complaint: confusion, diarrhea and nausea/vomiting  HPI: Donna Little is a 78 y.o. female with medical history significant of atrial fibrillation, chronic pain syndrome, chronic systolic heart failure, depression, GERD , emphysema  and hypertension who presented to the hospital with complaint of patient had diarrhea  Thursday morning and started throwing up yesterday. She complained about abdominal pain which in the lower portion of the abdomen.  Her son also reported that she more confused than baseline.   ED Course:  In the ER, BP 157/89, HR 91, RR 17, O2 saturation 96% on room air,  and Tmax 98.2. Cbc unremarkable. Chemistry demonstrated Na 141, K 3.2, Cl 99, bicarb 6, Bun/Cr 16/0.85 and glucose 154. Anion gap 15. Urinalysis demonstrated large leukocytes esterase and nitrates along with many bacteria and greater than 50 white blood cells.  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  No CT evidence of acute mesenteric ischemia.  Diffuse aortic atherosclerosis without aneurysmal dilatation, small juxtarenal mural thrombus that is not flow-limiting.  Stable hypervascular liver lesions consistent with hemangiomas.  CT head was negative for any acute intracranial abnormalities. EKG was not performed.   Review of Systems:  All systems reviewed and apart from history of presenting illness, are negative.  Past Medical History:  Diagnosis Date   A-fib [facility])    Atherosclerosis of both carotid arteries 2022   noted per CT on 05/14/20   Chronic pain syndrome    Chronic systolic heart failure (HCC)    Depression    Emphysema of lung (HCC) 2022   noted per CT scan on 05/14/20   Generalized atherosclerosis    GERD  (gastroesophageal reflux disease)    Hypertension    IBS (irritable bowel syndrome)    Melanoma (HCC)    Other specified nontoxic goiter    Palpitations    PMR (polymyalgia rheumatica) 07/04/2020   Polymyalgia rheumatica    Primary pulmonary hypertension (HCC)    Pulmonary embolism (HCC)    Stroke (HCC)    ministroke times 3 Right side weakness   Supraventricular tachycardia    Type 2 diabetes mellitus (HCC)    diet controlled    Past Surgical History:  Procedure Laterality Date   ABDOMINAL HYSTERECTOMY     APPENDECTOMY     CATARACT EXTRACTION     CORONARY ANGIOPLASTY WITH STENT PLACEMENT  2021   KYPHOPLASTY  05/2020   L4-L5   TRIGGER FINGER RELEASE Right 03/17/2023   Procedure: right middle and ring finger RELEASE TRIGGER FINGER/A-1 PULLEY;  Surgeon: Romona Harari, MD;  Location: [facility];  Service: Orthopedics;  Laterality: Right;  mac and local     reports that she has been smoking cigarettes. She started smoking about 60 years ago. She has a 65.4 pack-year smoking history. She has never used smokeless tobacco. She reports that she does not drink alcohol and does not use drugs.  Allergies  Allergen Reactions   Codeine Other (See Comments)    It made me crazy   Macrobid  [Nitrofurantoin ] Nausea And Vomiting   Methocarbamol Other (See Comments)    Sleepy   Sertraline Other (See Comments)    Insomnia   Tizanidine Other (See Comments)    Sleepy    Family History  Problem Relation Age of Onset   Heart disease Mother    Diabetes Mother    Hyperlipidemia Mother    Hypertension Mother    Stroke Mother    Heart attack Mother    Heart disease Brother    Heart attack Brother    Stroke Brother    Hypertension Brother    Hyperlipidemia Brother     Prior to Admission medications   Medication Sig Start Date End Date Taking? Authorizing Provider  Acetaminophen  500 MG capsule Take 1,000 mg by mouth every 6 (six) hours as needed for pain (or  headaches).    [provider]  albuterol  (VENTOLIN  HFA) 108 (90 Base) MCG/ACT inhaler Inhale 2 puffs into the lungs every 6 (six) hours as needed. Patient taking differently: Inhale 2 puffs into the lungs every 6 (six) hours as needed for wheezing or shortness of breath. 06/03/22   CoxAbigail, MD  amLODipine  (NORVASC ) 2.5 MG tablet Take 1 tablet (2.5 mg total) by mouth daily. 11/24/23   Cox, Abigail, MD  atorvastatin  (LIPITOR) 80 MG tablet Take 1 tablet (80 mg total) by mouth daily. Patient taking differently: Take 80 mg by mouth at bedtime. 06/21/23   CoxAbigail, MD  citalopram  (CELEXA ) 10 MG tablet Take 1 tablet (10 mg total) by mouth daily. 01/26/24   Sherre Abigail, MD  clopidogrel  (PLAVIX ) 75 MG tablet Take 1 tablet by mouth once daily 11/11/23   Cox, Abigail, MD  dicyclomine  (BENTYL ) 20 MG tablet Take 1 tablet (20 mg total) by mouth 4 (four) times daily -  before meals and at bedtime. 09/01/23   CoxAbigail, MD  diltiazem  (CARDIZEM  CD) 120 MG 24 hr capsule Take 1 capsule (120 mg total) by mouth daily. Patient taking differently: Take 120 mg by mouth at bedtime. 12/01/23   Sherre Abigail, MD  Fluticasone-Umeclidin-Vilant (TRELEGY ELLIPTA ) 100-62.5-25 MCG/ACT AEPB Inhale 1 puff into the lungs daily. Inhale into the lungs. 02/23/24   CoxAbigail, MD  furosemide  (LASIX ) 40 MG tablet Take 1 tablet (40 mg total) by mouth 2 (two) times daily. 02/25/24   CoxAbigail, MD  gabapentin  (NEURONTIN ) 800 MG tablet Take 1 tablet (800 mg total) by mouth 3 (three) times daily. 01/26/24   Cox, Abigail, MD  ipratropium-albuterol  (DUONEB) 0.5-2.5 (3) MG/3ML SOLN Take 3 mLs by nebulization every 6 (six) hours as needed. Patient taking differently: Take 3 mLs by nebulization every 6 (six) hours as needed (for shortness of breath or wheezing). 12/07/23   Ival Domino, FNP  levETIRAcetam  (KEPPRA ) 500 MG tablet Take 1 tablet (500 mg total) by mouth 2 (two) times daily. 01/26/24   CoxAbigail, MD  morphine  (MS  CONTIN) 15 MG 12 hr tablet Take 1 tablet (15 mg total) by mouth every 12 (twelve) hours. 01/27/24   CoxAbigail, MD  morphine  (MS CONTIN ) 15 MG 12 hr tablet Take 1 tablet (15 mg total) by mouth every 12 (twelve) hours. 02/25/24   CoxAbigail, MD  nitroGLYCERIN  (NITROSTAT ) 0.4 MG SL tablet Place under the tongue. Patient taking differently: Place 0.4 mg under the tongue every 5 (five) minutes as needed for chest pain.    [provider]  pantoprazole  (PROTONIX ) 40 MG tablet Take 1 tablet (40 mg total) by mouth daily. 01/26/24   CoxAbigail, MD  potassium chloride  SA (KLOR-CON  M) 20 MEQ tablet Take 1 tablet (20 mEq total) by mouth 2 (two) times daily with a meal. Resume on 12/20/2023 01/26/24   Sherre Abigail, MD  promethazine -dextromethorphan (  PROMETHAZINE -DM) 6.25-15 MG/5ML syrup Take 5 mLs by mouth 4 (four) times daily as needed for cough. Do not use and drive - May make drowsy. 12/07/23   Ival Domino, FNP  ranolazine  (RANEXA ) 500 MG 12 hr tablet Take 1 tablet (500 mg total) by mouth 2 (two) times daily. 02/10/24   Sherre Clapper, MD    Physical Exam: Vitals:   02/26/24 0100 02/26/24 0130 02/26/24 0256 02/26/24 0532  BP: 112/73 127/80 (!) 145/79 (!) 157/89  Pulse: 87  94 91  Resp:   (!) 22 17  Temp:   98 F (36.7 C) 98.2 F (36.8 C)  TempSrc:   Oral   SpO2: 96%  96% 96%  Weight:      Height:        Physical Exam Constitutional:      General: He is not in acute distress.    Appearance: Normal appearance.  HENT:     Head: Normocephalic and atraumatic.  Eyes:     Extraocular Movements: Extraocular movements intact.     Conjunctiva/sclera: Conjunctivae normal.     Pupils: Pupils are equal, round, and reactive to light.  Cardiovascular:     Rate and Rhythm: Normal rate and regular rhythm.     Pulses: Normal pulses.     Heart sounds: Normal heart sounds.  Pulmonary:     Effort: Pulmonary effort is normal. No respiratory distress.     Breath sounds: Normal breath sounds. No  wheezing, rhonchi or rales.  Abdominal:     General: Abdomen is flat. Bowel sounds are normal. There is no distension.     Palpations: Abdomen is soft.     Tenderness: There is no abdominal tenderness.  Musculoskeletal:        General: No deformity. Normal range of motion.  Skin:    General: Skin is warm and dry.     Coloration: Skin is not jaundiced.  Neurological:     General: No focal deficit present.     Mental Status: He is alert and oriented to person, place, and time. Mental status is at baseline.   Labs on Admission: I have personally reviewed following labs and imaging studies  CBC: Recent Labs  Lab 02/25/24 2209  WBC 6.0  NEUTROABS 4.3  HGB 16.1*  HCT 44.7  MCV 93.5  PLT 269   Basic Metabolic Panel: Recent Labs  Lab 02/23/24 0950 02/25/24 2209  NA 139 141  K 4.8 3.2*  CL 97 99  CO2 28 26  GLUCOSE 97 154*  BUN 13 16  CREATININE 0.97 0.85  CALCIUM  9.8 8.7*   GFR: Estimated Creatinine Clearance: 40.8 mL/min (by C-G formula based on SCr of 0.85 mg/dL). Liver Function Tests: Recent Labs  Lab 02/23/24 0950 02/25/24 2209  AST 23 30  ALT 15 14  ALKPHOS 99 96  BILITOT 0.4 0.6  PROT 6.2 7.0  ALBUMIN 4.2 4.4   No results for input(s): LIPASE, AMYLASE in the last 168 hours. No results for input(s): AMMONIA in the last 168 hours. Coagulation Profile: Recent Labs  Lab 02/25/24 2209  INR 1.0   Cardiac Enzymes: No results for input(s): CKTOTAL, CKMB, CKMBINDEX, TROPONINI in the last 168 hours. BNP (last 3 results) No results for input(s): PROBNP in the last 8760 hours. HbA1C: No results for input(s): HGBA1C in the last 72 hours. CBG: No results for input(s): GLUCAP in the last 168 hours. Lipid Profile: No results for input(s): CHOL, HDL, LDLCALC, TRIG, CHOLHDL, LDLDIRECT in the last 72 hours.  Thyroid  Function Tests: No results for input(s): TSH, T4TOTAL, FREET4, T3FREE, THYROIDAB in the last 72  hours. Anemia Panel: No results for input(s): VITAMINB12, FOLATE, FERRITIN, TIBC, IRON, RETICCTPCT in the last 72 hours. Urine analysis:    Component Value Date/Time   COLORURINE YELLOW 02/26/2024 0003   APPEARANCEUR CLOUDY (A) 02/26/2024 0003   LABSPEC 1.015 02/26/2024 0003   PHURINE 7.0 02/26/2024 0003   GLUCOSEU NEGATIVE 02/26/2024 0003   HGBUR TRACE (A) 02/26/2024 0003   BILIRUBINUR NEGATIVE 02/26/2024 0003   BILIRUBINUR negative 12/07/2023 1549   BILIRUBINUR neg 11/27/2020 0956   KETONESUR NEGATIVE 02/26/2024 0003   PROTEINUR 100 (A) 02/26/2024 0003   UROBILINOGEN 0.2 12/07/2023 1549   NITRITE POSITIVE (A) 02/26/2024 0003   LEUKOCYTESUR LARGE (A) 02/26/2024 0003    Radiological Exams on Admission: CT Angio Abd/Pel W and/or Wo Contrast Result Date: 02/26/2024 EXAM: CTA ABDOMEN AND PELVIS WITH CONTRAST 02/26/2024 01:02:06 AM TECHNIQUE: CTA images of the abdomen and pelvis with intravenous contrast. 100 mL iohexol  (OMNIPAQUE ) 350 MG/ML injection was administered. Three-dimensional MIP/volume rendered formations were performed. Automated exposure control, iterative reconstruction, and/or weight based adjustment of the mA/kV was utilized to reduce the radiation dose to as low as reasonably achievable. COMPARISON: None available. CLINICAL HISTORY: Abdominal pain and history of sma stenosis on prior CT. FINDINGS: VASCULATURE: AORTA: Diffuse atherosclerotic calcifications are noted without aneurysmal dilatation. Some mural thrombus is seen in a juxtarenal position although it does not appear to be flow limiting. No dissection. CELIAC TRUNK: Widely patent. SUPERIOR MESENTERIC ARTERY: Calcifications are noted at the origin of the SMA. A focal stenosis is noted approximately 15 mm beyond the sma origin secondary to soft atherosclerotic plaque. The previously seen abnormalities were felt to be more related to the timing of the contrast bolus as opposed to true high-grade stenosis of the  SMA. The distal SMA shows normal branching. RENAL ARTERIES: Patent. Atherosclerotic calcification is present without aneurysmal dilatation. ILIAC ARTERIES: Atherosclerotic calcification is present without aneurysmal dilatation. VENOUS SYSTEM: No specific venous abnormality is seen. LIVER: The liver demonstrates 2 hypervascular lesions, 1 best seen on image 12 of series 21 and the second inferiorly on image 23 of series 21. The enhancement pattern is that consistent with hemangioma. These are stable in appearance from the prior exam. GALLBLADDER AND BILE DUCTS: The gallbladder is decompressed. No biliary ductal dilatation. SPLEEN: The spleen is unremarkable. PANCREAS: The pancreas is unremarkable. ADRENAL GLANDS: Bilateral adrenal glands demonstrate no acute abnormality. KIDNEYS, URETERS AND BLADDER: Kidneys demonstrated normal enhancement pattern bilaterally. No renal calculi or obstructive changes are seen. The bladder is well distended. GI AND BOWEL: Stomach and duodenal sweep demonstrate no acute abnormality. The small bowel is within normal limits. Colon shows mild diverticular change without evidence of diverticulitis. The appendix has been surgically removed. No obstructive changes are seen. REPRODUCTIVE: The uterus has been surgically removed. PERITONEUM AND RETRPERITONEUM: No free fluid is noted. No free air. LUNG BASE: Lung bases are free of acute infiltrate or sizable effusion. LYMPH NODES: No lymphadenopathy is seen. BONES AND SOFT TISSUES: No acute abnormality of the bones. No acute soft tissue abnormality. IMPRESSION: 1. 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 No CT evidence of acute mesenteric ischemia. 2. Diffuse aortic atherosclerosis without aneurysmal dilatation, with small juxtarenal mural thrombus that is not flow limiting. No dissection. 3. Stable hypervascular liver lesions consistent with  hemangiomas. Electronically signed by: Oneil Devonshire  MD 02/26/2024 01:15 AM EST RP Workstation: [id number]   CT Head Wo Contrast Result Date: 02/26/2024 EXAM: CT HEAD WITHOUT CONTRAST 02/26/2024 01:00:00 AM TECHNIQUE: CT of the head was performed without the administration of intravenous contrast. Automated exposure control, iterative reconstruction, and/or weight based adjustment of the mA/kV was utilized to reduce the radiation dose to as low as reasonably achievable. COMPARISON: None available. CLINICAL HISTORY: Altered mental status, nontraumatic (Ped 0-17y). FINDINGS: BRAIN AND VENTRICLES: Basal ganglia mineralization bilaterally. No acute hemorrhage. No evidence of acute infarct. Mild atrophic changes are noted commensurate with the patient's given age. No mass effect or midline shift. ORBITS: Bilateral lens replacement noted. SINUSES: No acute abnormality. SOFT TISSUES AND SKULL: No acute soft tissue abnormality. No skull fracture. IMPRESSION: 1. No acute intracranial abnormality. Electronically signed by: Oneil Devonshire MD 02/26/2024 01:06 AM EST RP Workstation: [id number]    EKG: Independently reviewed.   Assessment/Plan Principal Problem:   Acute metabolic encephalopathy   Acute metabolic encephalopathy This is most likely secondary to acute uti Patient will be started on IV antibiotics Appears to be improving at this time  Abdominal pain Likely secondary to combination of cystitis Stool PCR was also ordered for rule out any GI causes She was having diarrhea   Acute cystitis Urine culture and blood cultures are pending Continue antibiotics at this time   Hypokalemia K is 3.2 Replace potassium  Hyperglycemia Will repeat bmp and continue to monitor Hemoglobin A1c  Atrial fibrillation  Chronic systolic heart failure Patient appears to be stable at this time Continue cardizem  CD 120 mg daily Patient is on plavix  no aspirin and no NOAC  COPD  Patient has been restarted on  her home inhalers and breathing treatments  Hypertension Continue norvasc   Seizure Patient will continue keppra  500 mg BID  DVT prophylaxis: lovenox  Code Status: full Family Communication:   Cornelison,Randall (Son) [phone] (Mobile)     Disposition Plan: Status is: Inpatient Remains inpatient appropriate because: acute encephalopathy   Consults called:  Admission status: inppatient Level of care: Level of care: Telemetry The medical decision making on this patient was of high complexity and the patient is at high risk for clinical deterioration, therefore this is a level 3 visit.  The medical decision making is of moderate complexity, therefore this is a level 2 visit.  Bradly MARLA Drones MD [facility]  If 7PM-7AM, please contact night-coverage [email or web address]  02/26/2024, 6:15 AM

## 2024-02-26 NOTE — Assessment & Plan Note (Signed)
 Routine wellness visit with no acute issues. Blood pressure well-controlled. Cardiac catheterization showed minimal cholesterol buildup and a stent bypassing a blockage. Heart monitor normal. - Continue ranolazine , amlodipine , diltiazem , atorvastatin , Lasix , clopidogrel , potassium supplements. - Flu shot ordered. - Requested records from [facility] in [place]. - Ordered MRI of lumbar spine. - Ordered vascular study of legs.

## 2024-02-26 NOTE — Assessment & Plan Note (Addendum)
 Numbness and pain radiating down left leg, possibly due to pinched nerve. Managed with gabapentin . No recent MRI of back. - Ordered MRI of lumbar spine. - Continue gabapentin . Orders:   MR Lumbar Spine Wo Contrast; Future

## 2024-02-26 NOTE — Assessment & Plan Note (Signed)
 Recommended smoking cessation.

## 2024-02-27 ENCOUNTER — Encounter: Payer: Self-pay | Admitting: Family Medicine

## 2024-02-27 DIAGNOSIS — G9341 Metabolic encephalopathy: Secondary | ICD-10-CM | POA: Diagnosis not present

## 2024-02-27 DIAGNOSIS — R338 Other retention of urine: Secondary | ICD-10-CM | POA: Diagnosis not present

## 2024-02-27 DIAGNOSIS — I4729 Other ventricular tachycardia: Secondary | ICD-10-CM | POA: Diagnosis not present

## 2024-02-27 LAB — CBC WITH DIFFERENTIAL/PLATELET
Abs Immature Granulocytes: 0.06 K/uL (ref 0.00–0.07)
Basophils Absolute: 0 K/uL (ref 0.0–0.1)
Basophils Relative: 1 %
Eosinophils Absolute: 0.1 K/uL (ref 0.0–0.5)
Eosinophils Relative: 2 %
HCT: 41.4 % (ref 36.0–46.0)
Hemoglobin: 14.4 g/dL (ref 12.0–15.0)
Immature Granulocytes: 1 %
Lymphocytes Relative: 23 %
Lymphs Abs: 1.4 K/uL (ref 0.7–4.0)
MCH: 33.6 pg (ref 26.0–34.0)
MCHC: 34.8 g/dL (ref 30.0–36.0)
MCV: 96.5 fL (ref 80.0–100.0)
Monocytes Absolute: 0.8 K/uL (ref 0.1–1.0)
Monocytes Relative: 13 %
Neutro Abs: 3.8 K/uL (ref 1.7–7.7)
Neutrophils Relative %: 60 %
Platelets: 223 K/uL (ref 150–400)
RBC: 4.29 MIL/uL (ref 3.87–5.11)
RDW: 12.4 % (ref 11.5–15.5)
WBC: 6.3 K/uL (ref 4.0–10.5)
nRBC: 0 % (ref 0.0–0.2)

## 2024-02-27 LAB — URINE CULTURE: Culture: <10000 — AB

## 2024-02-27 LAB — BASIC METABOLIC PANEL WITH GFR
Anion gap: 12 (ref 5–15)
BUN: 7 mg/dL — ABNORMAL LOW (ref 8–23)
CO2: 23 mmol/L (ref 22–32)
Calcium: 7.6 mg/dL — ABNORMAL LOW (ref 8.9–10.3)
Chloride: 105 mmol/L (ref 98–111)
Creatinine, Ser: 0.67 mg/dL (ref 0.44–1.00)
GFR, Estimated: >60 mL/min (ref >60)
Glucose, Bld: 85 mg/dL (ref 70–99)
Potassium: 3 mmol/L — ABNORMAL LOW (ref 3.5–5.1)
Sodium: 140 mmol/L (ref 135–145)

## 2024-02-27 LAB — MAGNESIUM: Magnesium: 2.1 mg/dL (ref 1.7–2.4)

## 2024-02-27 MED ORDER — POTASSIUM CHLORIDE CRYS ER 20 MEQ PO TBCR
40.0000 meq | EXTENDED_RELEASE_TABLET | Freq: Once | ORAL | Status: AC
  Administered 2024-02-27: 40 meq via ORAL
  Filled 2024-02-27: qty 2

## 2024-02-27 MED ORDER — POTASSIUM CHLORIDE 10 MEQ/100ML IV SOLN
10.0000 meq | INTRAVENOUS | Status: AC
  Administered 2024-02-27 (×6): 10 meq via INTRAVENOUS
  Filled 2024-02-27 (×3): qty 100

## 2024-02-27 NOTE — Assessment & Plan Note (Signed)
-   Large PVR overnight requiring straight cath - Continue monitoring bladder scans today.  Hopeful for ongoing improvement with improvement in her mentation and physical strength

## 2024-02-27 NOTE — Assessment & Plan Note (Signed)
-   Asymptomatic and noted on telemetry this morning -EKG reassuring - Continue monitoring electrolytes

## 2024-02-27 NOTE — Evaluation (Signed)
 Physical Therapy Evaluation Patient Details Name: Donna Little MRN: [id number] DOB: 18-Dec-1945 Today's Date: 02/27/2024  History of Present Illness  Donna Little is a 78 y.o. female who presented to the hospital with diarrhea, nausea, vomitting, as well as lower abdominal pain;   Her son reposrts that she is more confused than baseline; high suspicion fo rUTI; with medical history significant of atrial fibrillation, chronic pain syndrome, chronic systolic heart failure, depression, GERD , emphysema and hypertension  Clinical Impression   Pt admitted with above diagnosis. Lives at home alone, in a single-level home with a level entrance; Son visits daily in the mornings, and she has a housekeeper who visits and helps with cooking and cleaning during the week; Prior to admission, pt was able to manage walking household distances and basic ADLs without assist; however has had at least one fall in the past 6 months - related to not using her RW/Rollator; Presents to PT with generalized weakness, decr activity tolerance, decr safety awareness; needed up to min assist with trnsfer sit to stand and with progressive ambulation; At this time, I do believe getting back to her home is the most therapeutic place for [name];  Pt currently with functional limitations due to the deficits listed below (see PT Problem List). Pt will benefit from skilled PT to increase their independence and safety with mobility to allow discharge to the venue listed below.       She is very social -- I wonder if an Adult Day Care setting, or PACE-style services would really help her to keep up her social life, mobility and ADLs, while still maintaining her safety and potentially decreasing her fall risk.      If plan is discharge home, recommend the following: Direct supervision/assist for medications management;Direct supervision/assist for financial management;Assist for transportation;Supervision due to cognitive  status;Assistance with cooking/housework   Can travel by private vehicle        Equipment Recommendations Rolling walker (2 wheels);Other (comment) (Has a rollator; recommend discerning between RW and Rollator next session)  Recommendations for Other Services  OT consult (as ordered)    Functional Status Assessment Patient has had a recent decline in their functional status and demonstrates the ability to make significant improvements in function in a reasonable and predictable amount of time.     Precautions / Restrictions        Mobility  Bed Mobility Overal bed mobility: Needs Assistance Bed Mobility: Supine to Sit     Supine to sit: Supervision     General bed mobility comments: Fro safety    Transfers Overall transfer level: Needs assistance Equipment used: Rollator (4 wheels) Transfers: Sit to/from Stand Sit to Stand: Supervision, Contact guard assist           General transfer comment: Tend to need repetition of cues for safe hand placement; close guard for monitoring rollator brakes with start, stop, sit <> stand to/from Rollator    Ambulation/Gait Ambulation/Gait assistance: Supervision, Contact guard assist, Min assist Gait Distance (Feet): 60 Feet Assistive device: Rollator (4 wheels) Gait Pattern/deviations: Step-through pattern       General Gait Details: Mod cues and occasional min assist to keep rollator close; walked with ease, and very social in the hallway -- leading to distractibility  Stairs            Wheelchair Mobility     Tilt Bed    Modified Rankin (Stroke Patients Only)       Balance  Pertinent Vitals/Pain Pain Assessment Pain Assessment: No/denies pain    Home Living Family/patient expects to be discharged to:: Private residence Living Arrangements: Alone Available Help at Discharge: Family;Personal care attendant (son visits daily; housekeeper  comes M-F, helps with cleaning and some cooking) Type of Home: House Home Access: Level entry       Home Layout: One level Home Equipment: Rollator (4 wheels);Rolling Walker (2 wheels);Shower seat;Grab bars - tub/shower;Hand held shower head Additional Comments: Son has setup cameras in pt's home for supervision; pt is aware    Prior Function Prior Level of Function : History of Falls (last six months);Needs assist             Mobility Comments: Reports she should walk with walker, and more often than not, she doesn't; When asked why she has fallen recently she answers, Because I wasn't using my walker ADLs Comments: Reports no difficulty with bathing/dressing; uses the shower seat in her tub     Extremity/Trunk Assessment   Upper Extremity Assessment Upper Extremity Assessment: Defer to OT evaluation    Lower Extremity Assessment Lower Extremity Assessment: Generalized weakness (and noting fatigue after approx 10 miutes of standing activity)       Communication   Communication Communication: No apparent difficulties    Cognition Arousal: Alert Behavior During Therapy: WFL for tasks assessed/performed   PT - Cognitive impairments: Memory                       PT - Cognition Comments: Son and daughter in law present at the beginning of session; they occasionally remided her of more recent events - in particular recent falls Following commands: Intact (with occasional need for redirection back to task)       Cueing Cueing Techniques: Verbal cues, Gestural cues, Tactile cues     General Comments General comments (skin integrity, edema, etc.): Pt is personable, and very much values her independence; VSS with simple amb with rollator    Exercises     Assessment/Plan    PT Assessment Patient needs continued PT services  PT Problem List Decreased strength;Decreased activity tolerance;Decreased balance;Decreased mobility;Decreased cognition;Decreased  knowledge of use of DME;Decreased safety awareness;Decreased knowledge of precautions;Cardiopulmonary status limiting activity       PT Treatment Interventions DME instruction;Gait training;Stair training;Functional mobility training;Therapeutic activities;Therapeutic exercise;Balance training;Neuromuscular re-education;Cognitive remediation;Patient/family education;Wheelchair mobility training;Manual techniques    PT Goals (Current goals can be found in the Care Plan section)  Acute Rehab PT Goals Patient Stated Goal: to get home PT Goal Formulation: With patient Time For Goal Achievement: 03/12/24 Potential to Achieve Goals: Good    Frequency Min 2X/week     Co-evaluation               AM-PAC PT 6 Clicks Mobility  Outcome Measure Help needed turning from your back to your side while in a flat bed without using bedrails?: None Help needed moving from lying on your back to sitting on the side of a flat bed without using bedrails?: A Little Help needed moving to and from a bed to a chair (including a wheelchair)?: A Little Help needed standing up from a chair using your arms (e.g., wheelchair or bedside chair)?: A Little Help needed to walk in hospital room?: A Little Help needed climbing 3-5 steps with a railing? : A Little 6 Click Score: 19    End of Session Equipment Utilized During Treatment: Gait belt Activity Tolerance: Patient tolerated treatment well Patient left: with  call bell/phone within reach;Other (comment) (with OT in batrhoom) Nurse Communication: Mobility status PT Visit Diagnosis: Unsteadiness on feet (R26.81);Other abnormalities of gait and mobility (R26.89);Repeated falls (R29.6);Muscle weakness (generalized) (M62.81)    Time: 8784-8740 PT Time Calculation (min) (ACUTE ONLY): 44 min   Charges:   PT Evaluation $PT Eval Moderate Complexity: 1 Mod PT Treatments $Gait Training: 8-22 mins $Therapeutic Activity: 8-22 mins PT General Charges $$ ACUTE  PT VISIT: 1 Visit         Silvano Currier, PT  Acute Rehabilitation Services Office [phone] Secure Chat welcomed   Silvano VEAR Currier 02/27/2024, 2:10 PM

## 2024-02-27 NOTE — Evaluation (Signed)
 Occupational Therapy Evaluation Patient Details Name: Donna Little MRN: [id number] DOB: Sep 12, 1945 Today's Date: 02/27/2024   History of Present Illness   Donna Little is a 78 y.o. female who presented to the hospital with diarrhea, nausea, vomitting, as well as lower abdominal pain;   Her son reposrts that she is more confused than baseline; high suspicion fo rUTI; with medical history significant of atrial fibrillation, chronic pain syndrome, chronic systolic heart failure, depression, GERD , emphysema and hypertension     Clinical Impressions Pt greeted ambulating in hall with PT staff upon OT arrival. Pt pleasant & participatory, highly motivated to maintain her independence. Reports few falls from not using rollator at home. Relatively indep with BADLs and has assist for medication mgmt, light meal prep, and a cleaning housekeeper M-F. Son checks on her daily. She presents today with mild cognitive impairments, age-related memory deficits, and generalized weakness. She was no more than SBA/CGA for functional mobility & ambulation with rollator. Completed UB/LB ADLs with CGA. Left upright in chair, setup with lunch tray.  Pt is currently functioning below baseline and would benefit from ongoing acute OT services to progress towards safe discharge and to facilitate return to prior level of function. Current recommendation is home with home health OT.     If plan is discharge home, recommend the following:   Assistance with cooking/housework;Assist for transportation;Direct supervision/assist for medications management;Direct supervision/assist for financial management     Functional Status Assessment   Patient has had a recent decline in their functional status and demonstrates the ability to make significant improvements in function in a reasonable and predictable amount of time.     Equipment Recommendations   None recommended by OT     Recommendations for Other  Services         Precautions/Restrictions   Precautions Precautions: Fall Precaution/Restrictions Comments: delirium prevention precautions Restrictions Weight Bearing Restrictions Per Provider Order: No     Mobility Bed Mobility               General bed mobility comments: not assessed - pt OOB throughout session    Transfers Overall transfer level: Needs assistance Equipment used: Rollator (4 wheels) Transfers: Sit to/from Stand, Bed to chair/wheelchair/BSC Sit to Stand: Supervision, Contact guard assist     Step pivot transfers: Supervision, Contact guard assist     General transfer comment: Cued for hand placement and safe approach to surfaces/navigating around obstacles in room with rollator. VC for locking/unlocking brakes prior to sitting/standing.      Balance Overall balance assessment: Mild deficits observed, not formally tested                                         ADL either performed or assessed with clinical judgement   ADL Overall ADL's : Needs assistance/impaired Eating/Feeding: Independent   Grooming: Supervision/safety;Standing;Wash/dry hands   Upper Body Bathing: Set up   Lower Body Bathing: Contact guard assist   Upper Body Dressing : Set up   Lower Body Dressing: Contact guard assist   Toilet Transfer: Contact guard assist;Ambulation;Regular Toilet;Rollator (4 wheels)   Toileting- Clothing Manipulation and Hygiene: Contact guard assist       Functional mobility during ADLs: Supervision/safety;Contact guard assist;Rollator (4 wheels)       Vision Baseline Vision/History:  (no eye glasses present duing OT eval)       Perception  Praxis         Pertinent Vitals/Pain Pain Assessment Pain Assessment: No/denies pain     Extremity/Trunk Assessment Upper Extremity Assessment Upper Extremity Assessment: Generalized weakness   Lower Extremity Assessment Lower Extremity Assessment:  Generalized weakness       Communication Communication Communication: No apparent difficulties   Cognition Arousal: Alert Behavior During Therapy: WFL for tasks assessed/performed Cognition: Cognition impaired     Awareness: Online awareness impaired Memory impairment (select all impairments): Short-term memory, Working memory Attention impairment (select first level of impairment): Sustained attention Executive functioning impairment (select all impairments): Organization, Reasoning, Problem solving (evolving problem solving skills as session progressed (improved when prompted/reminded for safety throughout)) OT - Cognition Comments: suspect some level of age-related memory deficits, reduced insight and forgetful                 Following commands: Intact (intermittent redirection)       Cueing  General Comments   Cueing Techniques: Verbal cues;Gestural cues;Tactile cues  very sweet & participatory; highly values her independence   Exercises     Shoulder Instructions      Home Living Family/patient expects to be discharged to:: Private residence Living Arrangements: Alone Available Help at Discharge: Family;Personal care attendant (son visits daily in the morning, housekeeper M-F to assist with cleaning and light meal prep (primarily breakfast)) Type of Home: House Home Access: Level entry     Home Layout: One level     Bathroom Shower/Tub: Chief Strategy Officer: Handicapped height     Home Equipment: Rollator (4 wheels);Rolling Walker (2 wheels);Shower seat;Grab bars - tub/shower;Hand held shower head   Additional Comments: Son has cameras setup in pt's home for supervision (pt is aware); son sorts pt's medications out for her on a daily basis (used to do weekly pillboxes but switched to daily for improved accurate compliance)      Prior Functioning/Environment Prior Level of Function : History of Falls (last six months);Needs assist   Cognitive Assist : ADLs (cognitive) (IADLs)   ADLs (Cognitive): Set up cues       Mobility Comments: pt reports she should use her walker more often than she does, endorses falling due to not using rollator ADLs Comments: indep with BADLs; uses shower seat in tub/shower    OT Problem List: Decreased strength;Decreased activity tolerance;Impaired balance (sitting and/or standing);Decreased cognition   OT Treatment/Interventions: Self-care/ADL training;Therapeutic exercise;Therapeutic activities;Patient/family education;Balance training;Energy conservation      OT Goals(Current goals can be found in the care plan section)   Acute Rehab OT Goals Patient Stated Goal: go home and get better OT Goal Formulation: With patient Time For Goal Achievement: 03/12/24 Potential to Achieve Goals: Good   OT Frequency:  Min 2X/week    Co-evaluation              AM-PAC OT 6 Clicks Daily Activity     Outcome Measure Help from another person eating meals?: None Help from another person taking care of personal grooming?: A Little Help from another person toileting, which includes using toliet, bedpan, or urinal?: A Little Help from another person bathing (including washing, rinsing, drying)?: A Little Help from another person to put on and taking off regular upper body clothing?: None Help from another person to put on and taking off regular lower body clothing?: A Little 6 Click Score: 20   End of Session Equipment Utilized During Treatment: Rollator (4 wheels) Nurse Communication: Mobility status  Activity Tolerance: Patient tolerated treatment  well Patient left: in chair;with call bell/phone within reach;with chair alarm set  OT Visit Diagnosis: Unsteadiness on feet (R26.81);Other symptoms and signs involving cognitive function                Time: 1300-1323 OT Time Calculation (min): 23 min Charges:  OT General Charges $OT Visit: 1 Visit OT Evaluation $OT Eval Low  Complexity: 1 Low  Royer Cristobal M. [place], OTR/L [facility] Acute Rehabilitation Services [phone] Secure Chat Preferred  Burtis Imhoff 02/27/2024, 4:45 PM

## 2024-02-27 NOTE — Progress Notes (Signed)
 Progress Note    Donna Little   FMW:[id number]  DOB: 02-23-1946  DOA: 02/25/2024     1 PCP: Donna Clapper, MD  Initial CC: AMS, diarrhea  Hospital Course: Donna Little is an 78 year old female with PMH atrial fibrillation, chronic pain syndrome, chronic systolic heart failure, depression, GERD, emphysema and hypertension who presented to the hospital with complaints of diarrhea and abd pain.  Symptoms began around Thursday and had associated vomiting as well. There is also reports of patient being confused.  In the ER, BP 157/89, HR 91, RR 17, O2 saturation 96% on room air,  and Tmax 98.2. Cbc unremarkable. Chemistry demonstrated Na 141, K 3.2, Cl 99, bicarb 6, Bun/Cr 16/0.85 and glucose 154. Anion gap 15. Urinalysis demonstrated large leukocytes esterase and nitrates along with many bacteria and greater than 50 white blood cells.  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  No CT evidence of acute mesenteric ischemia.  Diffuse aortic atherosclerosis without aneurysmal dilatation, small juxtarenal mural thrombus that is not flow-limiting.  Stable hypervascular liver lesions consistent with hemangiomas.  CT head was negative for any acute intracranial abnormalities. EKG was not performed.  Interval History:   Confusion better this morning.  No longer needs sitter.  Abdominal pain has also improved.  No diarrhea since admission, therefore have been unable to test stool studies.  Otherwise doing okay this morning. We will get PT/OT involved to make sure able to return home, hopefully tomorrow.  Assessment and Plan: * Acute metabolic encephalopathy - Suspected due to UTI on admission - Continue antibiotics and monitoring mentation  Diarrhea-resolved as of 02/27/2024 - No diarrhea since admission and therefore not able to obtain stool studies -Okay to  discontinue enteric precautions once C. difficile testing time period expires  UTI (urinary tract infection)-resolved as of 02/27/2024 - Patient presented with altered mentation and UA suggestive of infection - Continue Rocephin  - Urine culture inconclusive.  Will plan to complete Rocephin  course on 11/24  Acute urinary retention - Large PVR overnight requiring straight cath - Continue monitoring bladder scans today.  Hopeful for ongoing improvement with improvement in her mentation and physical strength  NSVT (nonsustained ventricular tachycardia) (HCC) - Asymptomatic and noted on telemetry this morning -EKG reassuring - Continue monitoring electrolytes  PAF (paroxysmal atrial fibrillation) (HCC) - Only on Plavix  at this time -Continue Cardizem   History of CVA (cerebrovascular accident) - Continue Plavix  and Lipitor  Hypokalemia - Replete as needed  GERD (gastroesophageal reflux disease) - Continue Protonix   Hyperlipidemia - Continue Lipitor    Antimicrobials: Rocephin  02/26/2024 >> current  DVT prophylaxis:  enoxaparin  (LOVENOX ) injection 40 mg Start: 02/26/24 1000   Code Status:   Code Status: Full Code  Mobility Assessment (Last 72 Hours)     Mobility Assessment     Row Name 02/26/24 2337 02/26/24 1818 02/26/24 0800 02/26/24 0520     Does the patient have exclusion criteria? No - Perform mobility assessment No - Perform mobility assessment No - Perform mobility assessment No - Perform mobility assessment    What is the highest level of mobility based on the mobility assessment? Level 4 (Ambulates with assistance) - Balance while stepping forward/back - Complete Level 4 (Ambulates with assistance) - Balance while stepping forward/back - Complete Level 4 (Ambulates with assistance) - Balance while stepping forward/back - Complete Level 4 (Ambulates with assistance) - Balance while stepping  forward/back - Complete       Diet: Diet Orders (From admission,  onward)     Start     Ordered   02/27/24 1106  Diet regular Fluid consistency: Thin  Diet effective now       Question:  Fluid consistency:  Answer:  Thin   02/27/24 1105            Barriers to discharge: None Disposition Plan: TBD HH orders placed: TBD Status is: Inpatient  Objective: Blood pressure (!) 144/62, pulse 67, temperature 98.2 F (36.8 C), temperature source Oral, resp. rate 16, height 5' 1.5 (1.562 m), weight 54.4 kg, SpO2 92%.  Examination:  Physical Exam Constitutional:      Appearance: Normal appearance.  HENT:     Head: Normocephalic and atraumatic.     Mouth/Throat:     Mouth: Mucous membranes are moist.  Eyes:     Extraocular Movements: Extraocular movements intact.  Cardiovascular:     Rate and Rhythm: Normal rate and regular rhythm.  Pulmonary:     Effort: Pulmonary effort is normal. No respiratory distress.     Breath sounds: Normal breath sounds. No wheezing.  Abdominal:     General: Bowel sounds are normal. There is no distension.     Palpations: Abdomen is soft.     Tenderness: There is no abdominal tenderness.  Musculoskeletal:        General: Normal range of motion.     Cervical back: Normal range of motion and neck supple.  Skin:    General: Skin is warm and dry.  Neurological:     General: No focal deficit present.     Mental Status: She is alert and oriented to person, place, and time.      Consultants:    Procedures:    Data Reviewed: Results for orders placed or performed during the hospital encounter of 02/25/24 (from the past 24 hours)  Basic metabolic panel with GFR     Status: Abnormal   Collection Time: 02/27/24  5:05 AM  Result Value Ref Range   Sodium 140 135 - 145 mmol/L   Potassium 3.0 (L) 3.5 - 5.1 mmol/L   Chloride 105 98 - 111 mmol/L   CO2 23 22 - 32 mmol/L   Glucose, Bld 85 70 - 99 mg/dL   BUN 7 (L) 8 - 23 mg/dL   Creatinine, Ser 9.32 0.44 - 1.00 mg/dL   Calcium  7.6 (L) 8.9 - 10.3 mg/dL   GFR,  Estimated >39 >39 mL/min   Anion gap 12 5 - 15  CBC with Differential/Platelet     Status: None   Collection Time: 02/27/24  5:05 AM  Result Value Ref Range   WBC 6.3 4.0 - 10.5 K/uL   RBC 4.29 3.87 - 5.11 MIL/uL   Hemoglobin 14.4 12.0 - 15.0 g/dL   HCT 58.5 63.9 - 53.9 %   MCV 96.5 80.0 - 100.0 fL   MCH 33.6 26.0 - 34.0 pg   MCHC 34.8 30.0 - 36.0 g/dL   RDW 87.5 88.4 - 84.4 %   Platelets 223 150 - 400 K/uL   nRBC 0.0 0.0 - 0.2 %   Neutrophils Relative % 60 %   Neutro Abs 3.8 1.7 - 7.7 K/uL   Lymphocytes Relative 23 %   Lymphs Abs 1.4 0.7 - 4.0 K/uL   Monocytes Relative 13 %   Monocytes Absolute 0.8 0.1 - 1.0 K/uL   Eosinophils Relative 2 %   Eosinophils Absolute 0.1  0.0 - 0.5 K/uL   Basophils Relative 1 %   Basophils Absolute 0.0 0.0 - 0.1 K/uL   Immature Granulocytes 1 %   Abs Immature Granulocytes 0.06 0.00 - 0.07 K/uL  Magnesium     Status: None   Collection Time: 02/27/24  5:05 AM  Result Value Ref Range   Magnesium 2.1 1.7 - 2.4 mg/dL    I have reviewed pertinent nursing notes, vitals, labs, and images as necessary. I have ordered labwork to follow up on as indicated.  I have reviewed the last notes from staff over past 24 hours. I have discussed patient's care plan and test results with nursing staff, CM/SW, and other staff as appropriate.  Old records reviewed in assessment of this patient  Time spent: Greater than 50% of the 55 minute visit was spent in counseling/coordination of care for the patient as laid out in the A&P.   LOS: 1 day   Alm Apo, MD [facility] 02/27/2024, 12:57 PM

## 2024-02-28 DIAGNOSIS — G9341 Metabolic encephalopathy: Secondary | ICD-10-CM | POA: Diagnosis not present

## 2024-02-28 DIAGNOSIS — N3000 Acute cystitis without hematuria: Secondary | ICD-10-CM | POA: Diagnosis not present

## 2024-02-28 DIAGNOSIS — R197 Diarrhea, unspecified: Secondary | ICD-10-CM | POA: Diagnosis not present

## 2024-02-28 DIAGNOSIS — R338 Other retention of urine: Secondary | ICD-10-CM | POA: Diagnosis not present

## 2024-02-28 LAB — BASIC METABOLIC PANEL WITH GFR
Anion gap: 8 (ref 5–15)
Anion gap: 9 (ref 5–15)
BUN: 7 mg/dL — ABNORMAL LOW (ref 8–23)
BUN: 7 mg/dL — ABNORMAL LOW (ref 8–23)
CO2: 24 mmol/L (ref 22–32)
CO2: 22 mmol/L (ref 22–32)
Calcium: 7.6 mg/dL — ABNORMAL LOW (ref 8.9–10.3)
Calcium: 7.5 mg/dL — ABNORMAL LOW (ref 8.9–10.3)
Chloride: 108 mmol/L (ref 98–111)
Chloride: 107 mmol/L (ref 98–111)
Creatinine, Ser: 0.73 mg/dL (ref 0.44–1.00)
Creatinine, Ser: 0.88 mg/dL (ref 0.44–1.00)
GFR, Estimated: >60 mL/min (ref >60)
GFR, Estimated: >60 mL/min (ref >60)
Glucose, Bld: 82 mg/dL (ref 70–99)
Glucose, Bld: 182 mg/dL — ABNORMAL HIGH (ref 70–99)
Potassium: 5.3 mmol/L — ABNORMAL HIGH (ref 3.5–5.1)
Potassium: 4 mmol/L (ref 3.5–5.1)
Sodium: 140 mmol/L (ref 135–145)
Sodium: 138 mmol/L (ref 135–145)

## 2024-02-28 LAB — CBC WITH DIFFERENTIAL/PLATELET
Abs Immature Granulocytes: 0.04 K/uL (ref 0.00–0.07)
Basophils Absolute: 0.1 K/uL (ref 0.0–0.1)
Basophils Relative: 1 %
Eosinophils Absolute: 0.5 K/uL (ref 0.0–0.5)
Eosinophils Relative: 10 %
HCT: 38.7 % (ref 36.0–46.0)
Hemoglobin: 13.4 g/dL (ref 12.0–15.0)
Immature Granulocytes: 1 %
Lymphocytes Relative: 39 %
Lymphs Abs: 1.9 K/uL (ref 0.7–4.0)
MCH: 33.9 pg (ref 26.0–34.0)
MCHC: 34.6 g/dL (ref 30.0–36.0)
MCV: 98 fL (ref 80.0–100.0)
Monocytes Absolute: 0.7 K/uL (ref 0.1–1.0)
Monocytes Relative: 15 %
Neutro Abs: 1.6 K/uL — ABNORMAL LOW (ref 1.7–7.7)
Neutrophils Relative %: 34 %
Platelets: 208 K/uL (ref 150–400)
RBC: 3.95 MIL/uL (ref 3.87–5.11)
RDW: 12.4 % (ref 11.5–15.5)
WBC: 4.7 K/uL (ref 4.0–10.5)
nRBC: 0 % (ref 0.0–0.2)

## 2024-02-28 LAB — MAGNESIUM: Magnesium: 2 mg/dL (ref 1.7–2.4)

## 2024-02-28 MED ORDER — SODIUM CHLORIDE 0.9 % IV BOLUS
1000.0000 mL | Freq: Once | INTRAVENOUS | Status: AC
  Administered 2024-02-28: 1000 mL via INTRAVENOUS

## 2024-02-28 MED ORDER — FUROSEMIDE 10 MG/ML IJ SOLN
20.0000 mg | Freq: Once | INTRAMUSCULAR | Status: AC
  Administered 2024-02-28: 20 mg via INTRAVENOUS
  Filled 2024-02-28: qty 2

## 2024-02-28 NOTE — Progress Notes (Signed)
 Physical Therapy Treatment Patient Details Name: Donna Little MRN: [id number] DOB: [birth year] Today's Date: 02/28/2024   History of Present Illness Donna Little is a 78 y.o. female who presented to the hospital with diarrhea, nausea, vomitting, as well as lower abdominal pain;   Her son reposrts that she is more confused than baseline; high suspicion fo rUTI; with medical history significant of atrial fibrillation, chronic pain syndrome, chronic systolic heart failure, depression, GERD , emphysema and hypertension    PT Comments  Pt states she is feeling better and ready to go home. Pt is very social speaking to everyone she sees, but is still able to ambulate at a supervision level with the Rollator today. She will likely do even better at home with less external distractions. Still needs some cuing for safety otherwise moves a supervision level. Pt can use the Rollator she has at home. D/c plans remain appropriate. PT will continue to follow acutely.     If plan is discharge home, recommend the following: Direct supervision/assist for medications management;Direct supervision/assist for financial management;Assist for transportation;Supervision due to cognitive status;Assistance with cooking/housework   Can travel by private vehicle        Equipment Recommendations  None recommended by PT    Recommendations for Other Services OT consult     Precautions / Restrictions Precautions Precautions: Fall Precaution/Restrictions Comments: delirium prevention precautions Restrictions Weight Bearing Restrictions Per Provider Order: No     Mobility  Bed Mobility Overal bed mobility: Needs Assistance Bed Mobility: Supine to Sit, Sit to Supine     Supine to sit: Supervision Sit to supine: Supervision   General bed mobility comments: pt preference to return to bed after ambulation    Transfers Overall transfer level: Needs assistance Equipment used: Rollator (4 wheels) Transfers:  Sit to/from Stand, Bed to chair/wheelchair/BSC Sit to Stand: Supervision           General transfer comment: needs reminding for engaging brakes before standing to Rollator but supervision to stand    Ambulation/Gait Ambulation/Gait assistance: Supervision Gait Distance (Feet): 200 Feet Assistive device: Rollator (4 wheels) Gait Pattern/deviations: Step-through pattern Gait velocity: WFL Gait velocity interpretation: <1.8 ft/sec, indicate of risk for recurrent falls   General Gait Details: frequent cuing to stay close to rollator but overall supervision for ambulation         Balance Overall balance assessment: Mild deficits observed, not formally tested                                          Communication Communication Communication: No apparent difficulties  Cognition Arousal: Alert Behavior During Therapy: WFL for tasks assessed/performed   PT - Cognitive impairments: Memory                         Following commands: Intact (intermittent redirection)      Cueing Cueing Techniques: Verbal cues, Gestural cues, Tactile cues     General Comments General comments (skin integrity, edema, etc.): VSS, eager to go home, very personable with everyone she sees      Pertinent Vitals/Pain Pain Assessment Pain Assessment: No/denies pain     PT Goals (current goals can now be found in the care plan section) Acute Rehab PT Goals Patient Stated Goal: to get home PT Goal Formulation: With patient Time For Goal Achievement: 03/12/24 Potential to Achieve Goals: Good  Progress towards PT goals: Progressing toward goals    Frequency    Min 2X/week       AM-PAC PT 6 Clicks Mobility   Outcome Measure  Help needed turning from your back to your side while in a flat bed without using bedrails?: None Help needed moving from lying on your back to sitting on the side of a flat bed without using bedrails?: None Help needed moving to and  from a bed to a chair (including a wheelchair)?: A Little Help needed standing up from a chair using your arms (e.g., wheelchair or bedside chair)?: A Little Help needed to walk in hospital room?: A Little Help needed climbing 3-5 steps with a railing? : A Little 6 Click Score: 20    End of Session Equipment Utilized During Treatment: Gait belt Activity Tolerance: Patient tolerated treatment well Patient left: with call bell/phone within reach;in bed;with bed alarm set Nurse Communication: Mobility status PT Visit Diagnosis: Unsteadiness on feet (R26.81);Other abnormalities of gait and mobility (R26.89);Repeated falls (R29.6);Muscle weakness (generalized) (M62.81)     Time: 8878-8856 PT Time Calculation (min) (ACUTE ONLY): 22 min  Charges:    $Gait Training: 8-22 mins PT General Charges $$ ACUTE PT VISIT: 1 Visit                     Joedy Eickhoff B. Fleeta Lapidus PT, DPT Acute Rehabilitation Services Please use secure chat or  Call Office [phone]    Donna Little 02/28/2024, 1:14 PM

## 2024-02-28 NOTE — Discharge Summary (Signed)
 Physician Discharge Summary   Donna Little FMW:[id number] DOB: 02/14/1946 DOA: 02/25/2024  PCP: Sherre Clapper, MD  Admit date: 02/25/2024 Discharge date: 02/28/2024  Admitted From: Home Disposition:  Home Discharging physician: Alm Apo, MD Barriers to discharge: none   Discharge Condition: stable CODE STATUS: Full  Diet recommendation:  Diet Orders (From admission, onward)     Start     Ordered   02/28/24 0000  Diet general        02/28/24 1438   02/27/24 1106  Diet regular Fluid consistency: Thin  Diet effective now       Question:  Fluid consistency:  Answer:  Thin   02/27/24 1105            Hospital Course: Donna Little is an 78 year old female with PMH atrial fibrillation, chronic pain syndrome, chronic systolic heart failure, depression, GERD, emphysema and hypertension who presented to the hospital with complaints of diarrhea and abd pain.  Symptoms began around Thursday and had associated vomiting as well. There is also reports of patient being confused.  In the ER, BP 157/89, HR 91, RR 17, O2 saturation 96% on room air,  and Tmax 98.2. Cbc unremarkable. Chemistry demonstrated Na 141, K 3.2, Cl 99, bicarb 6, Bun/Cr 16/0.85 and glucose 154. Anion gap 15. Urinalysis demonstrated large leukocytes esterase and nitrates along with many bacteria and greater than 50 white blood cells.  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  No CT evidence of acute mesenteric ischemia.  Diffuse aortic atherosclerosis without aneurysmal dilatation, small juxtarenal mural thrombus that is not flow-limiting.  Stable hypervascular liver lesions consistent with hemangiomas.  CT head was negative for any acute intracranial abnormalities. EKG was not performed.  Assessment and Plan: * Acute metabolic encephalopathy-resolved as of 02/28/2024 - Suspected due to  UTI on admission - Mentation improved back to normal with antibiotics  Diarrhea-resolved as of 02/27/2024 - No diarrhea since admission and therefore not able to obtain stool studies -Okay to discontinue enteric precautions once C. difficile testing time period expires  UTI (urinary tract infection)-resolved as of 02/27/2024 - Patient presented with altered mentation and UA suggestive of infection - Continue Rocephin  - Urine culture inconclusive.  Will plan to complete Rocephin  course on 11/24  Acute urinary retention - Large PVR overnight requiring straight cath on 11/23 - No further large residuals with ongoing serial scanning -Able to void adequately prior to discharge  NSVT (nonsustained ventricular tachycardia) (HCC) - Asymptomatic and noted on telemetry 11/23 -EKG reassuring - monitoring electrolytes  PAF (paroxysmal atrial fibrillation) (HCC) - Only on Plavix  at this time -Continue Cardizem   History of CVA (cerebrovascular accident) - Continue Plavix  and Lipitor  Hypokalemia - Replete as needed  GERD (gastroesophageal reflux disease) - Continue Protonix   Hyperlipidemia - Continue Lipitor   The patient's acute and chronic medical conditions were treated accordingly. On day of discharge, patient was felt deemed stable for discharge. Patient/family member advised to call PCP or come back to ER if needed.   Principal Diagnosis: Acute metabolic encephalopathy  Discharge Diagnoses: Active Hospital Problems   Diagnosis Date Noted   NSVT (nonsustained ventricular tachycardia) (HCC) 02/27/2024    Priority: 3.   Acute urinary retention 02/27/2024    Priority: 3.   PAF (paroxysmal atrial fibrillation) (HCC) 02/26/2024   History of CVA (cerebrovascular accident) 12/14/2023   Hypokalemia 12/13/2023   Chronic kidney  disease, stage 3a (HCC) 02/22/2023   GERD (gastroesophageal reflux disease)    Hyperlipidemia 01/16/2015    Resolved Hospital Problems   Diagnosis Date  Noted Date Resolved   Acute metabolic encephalopathy 02/26/2024 02/28/2024    Priority: 1.   UTI (urinary tract infection) 02/26/2024 02/27/2024    Priority: 2.   Diarrhea 02/26/2024 02/27/2024    Priority: 2.     Discharge Instructions     Diet general   Complete by: As directed    Increase activity slowly   Complete by: As directed       Allergies as of 02/28/2024       Reactions   Codeine Other (See Comments)   It made me crazy   Macrobid  [nitrofurantoin ] Nausea And Vomiting   Methocarbamol Other (See Comments)   Sleepy   Sertraline Other (See Comments)   Insomnia   Tizanidine Other (See Comments)   Sleepy        Medication List     TAKE these medications    Acetaminophen  500 MG capsule Take 1,000 mg by mouth every 6 (six) hours as needed for pain (or headaches).   albuterol  108 (90 Base) MCG/ACT inhaler Commonly known as: VENTOLIN  HFA Inhale 2 puffs into the lungs every 6 (six) hours as needed.   amLODipine  2.5 MG tablet Commonly known as: NORVASC  Take 1 tablet (2.5 mg total) by mouth daily.   atorvastatin  80 MG tablet Commonly known as: LIPITOR Take 1 tablet (80 mg total) by mouth daily. What changed: when to take this   citalopram  10 MG tablet Commonly known as: CELEXA  Take 1 tablet (10 mg total) by mouth daily.   clopidogrel  75 MG tablet Commonly known as: PLAVIX  Take 1 tablet by mouth once daily   dicyclomine  20 MG tablet Commonly known as: BENTYL  Take 1 tablet (20 mg total) by mouth 4 (four) times daily -  before meals and at bedtime.   diltiazem  120 MG 24 hr capsule Commonly known as: CARDIZEM  CD Take 1 capsule (120 mg total) by mouth daily. What changed: when to take this   furosemide  40 MG tablet Commonly known as: LASIX  Take 1 tablet (40 mg total) by mouth 2 (two) times daily.   gabapentin  800 MG tablet Commonly known as: NEURONTIN  Take 1 tablet (800 mg total) by mouth 3 (three) times daily. What changed: when to take  this   ipratropium-albuterol  0.5-2.5 (3) MG/3ML Soln Commonly known as: DUONEB Take 3 mLs by nebulization every 6 (six) hours as needed.   levETIRAcetam  500 MG tablet Commonly known as: KEPPRA  Take 1 tablet (500 mg total) by mouth 2 (two) times daily.   morphine  15 MG 12 hr tablet Commonly known as: MS CONTIN  Take 1 tablet (15 mg total) by mouth every 12 (twelve) hours.   nitroGLYCERIN  0.4 MG SL tablet Commonly known as: NITROSTAT  Place under the tongue. What changed:  how much to take when to take this reasons to take this   pantoprazole  40 MG tablet Commonly known as: PROTONIX  Take 1 tablet (40 mg total) by mouth daily.   potassium chloride  SA 20 MEQ tablet Commonly known as: KLOR-CON  M Take 1 tablet (20 mEq total) by mouth 2 (two) times daily with a meal. Resume on 12/20/2023   promethazine -dextromethorphan 6.25-15 MG/5ML syrup Commonly known as: PROMETHAZINE -DM Take 5 mLs by mouth 4 (four) times daily as needed for cough. Do not use and drive - May make drowsy.   ranolazine  500 MG 12 hr tablet Commonly known  as: RANEXA  Take 1 tablet (500 mg total) by mouth 2 (two) times daily.   Trelegy Ellipta  100-62.5-25 MCG/ACT Aepb Generic drug: Fluticasone-Umeclidin-Vilant Inhale 1 puff into the lungs daily. Inhale into the lungs.               Durable Medical Equipment  (From admission, onward)           Start     Ordered   02/28/24 0744  For home use only DME Walker rolling  Once       Question Answer Comment  Walker: With 5 Inch Wheels   Patient needs a walker to treat with the following condition Generalized muscle weakness      02/28/24 0743            Contact information for follow-up providers     Care, [facility] Follow up.   Specialty: Home Health Services Why: [facility] will provide home health services.  They will call you in the next 24-48 hours to set up services. Contact information: [address] [place] [address] [phone]              Contact information for after-discharge care     Home Medical Care     [facility]) .   Service: Home Health Services Contact information: [address] [place]  [address] [phone]                    Allergies  Allergen Reactions   Codeine Other (See Comments)    It made me crazy   Macrobid  [Nitrofurantoin ] Nausea And Vomiting   Methocarbamol Other (See Comments)    Sleepy   Sertraline Other (See Comments)    Insomnia   Tizanidine Other (See Comments)    Sleepy    Consultations:   Procedures:   Discharge Exam: BP 121/89 (BP Location: Left Arm)   Pulse 62   Temp 97.9 F (36.6 C)   Resp 20   Ht 5' 1.5 (1.562 m)   Wt 54.4 kg   LMP  (LMP Unknown)   SpO2 96%   BMI 22.29 kg/m  Physical Exam Constitutional:      Appearance: Normal appearance.  HENT:     Head: Normocephalic and atraumatic.     Mouth/Throat:     Mouth: Mucous membranes are moist.  Eyes:     Extraocular Movements: Extraocular movements intact.  Cardiovascular:     Rate and Rhythm: Normal rate and regular rhythm.  Pulmonary:     Effort: Pulmonary effort is normal. No respiratory distress.     Breath sounds: Normal breath sounds. No wheezing.  Abdominal:     General: Bowel sounds are normal. There is no distension.     Palpations: Abdomen is soft.     Tenderness: There is no abdominal tenderness.  Musculoskeletal:        General: Normal range of motion.     Cervical back: Normal range of motion and neck supple.  Skin:    General: Skin is warm and dry.  Neurological:     General: No focal deficit present.     Mental Status: She is alert and oriented to person, place, and time.      The results of significant diagnostics from this hospitalization (including imaging, microbiology, ancillary and laboratory) are listed below for reference.   Microbiology: Recent  Results (from the past 240 hours)  Culture, blood (Routine x  2)     Status: None (Preliminary result)   Collection Time: 02/25/24 10:20 PM   Specimen: BLOOD  Result Value Ref Range Status   Specimen Description   Final    BLOOD RIGHT ANTECUBITAL Performed at [facility], [address] [place], [place] [address]    Special Requests   Final    BOTTLES DRAWN AEROBIC AND ANAEROBIC Blood Culture adequate volume Performed at [facility], [address] [place], [place] [address]    Culture   Final    NO GROWTH 2 DAYS Performed at [facility], [address] [place], [place] [address]    Report Status PENDING  Incomplete  Culture, blood (Routine x 2)     Status: None (Preliminary result)   Collection Time: 02/25/24 10:25 PM   Specimen: BLOOD  Result Value Ref Range Status   Specimen Description   Final    BLOOD RIGHT ANTECUBITAL Performed at [facility], [address] [place], [place] [address]    Special Requests   Final    BOTTLES DRAWN AEROBIC AND ANAEROBIC Blood Culture adequate volume Performed at [facility], [address] [place], [place] [address]    Culture   Final    NO GROWTH 2 DAYS Performed at [facility], [address], [place], [place] [address]    Report Status PENDING  Incomplete  Urine Culture     Status: Abnormal   Collection Time: 02/26/24 12:03 AM   Specimen: Urine, Random  Result Value Ref Range Status   Specimen Description   Final    URINE, RANDOM Performed at [facility], [address] [place], [place] [address]    Special Requests   Final    NONE Reflexed from [id number] Performed at [facility], [address] [place], [place] [address]    Culture (A)  Final    <10,000 COLONIES/mL INSIGNIFICANT GROWTH Performed at [facility], [address] [place], [place] [address]    Report Status 02/27/2024 FINAL  Final     Labs: BNP (last 3 results) No  results for input(s): BNP in the last 8760 hours. Basic Metabolic Panel: Recent Labs  Lab 02/23/24 0950 02/25/24 2209 02/27/24 0505 02/28/24 0516 02/28/24 1134  NA 139 141 140 140 138  K 4.8 3.2* 3.0* 5.3* 4.0  CL 97 99 105 108 107  CO2 28 26 23 24 22   GLUCOSE 97 154* 85 82 182*  BUN 13 16 7* 7* 7*  CREATININE 0.97 0.85 0.67 0.73 0.88  CALCIUM  9.8 8.7* 7.6* 7.6* 7.5*  MG  --   --  2.1 2.0  --    Liver Function Tests: Recent Labs  Lab 02/23/24 0950 02/25/24 2209  AST 23 30  ALT 15 14  ALKPHOS 99 96  BILITOT 0.4 0.6  PROT 6.2 7.0  ALBUMIN 4.2 4.4   No results for input(s): LIPASE, AMYLASE in the last 168 hours. No results for input(s): AMMONIA in the last 168 hours. CBC: Recent Labs  Lab 02/25/24 2209 02/27/24 0505 02/28/24 0516  WBC 6.0 6.3 4.7  NEUTROABS 4.3 3.8 1.6*  HGB 16.1* 14.4 13.4  HCT 44.7 41.4 38.7  MCV 93.5 96.5 98.0  PLT 269 223 208   Cardiac Enzymes: No results for input(s): CKTOTAL, CKMB, CKMBINDEX, TROPONINI in the last 168 hours. BNP: Invalid input(s): POCBNP CBG: No results for input(s):  GLUCAP in the last 168 hours. D-Dimer No results for input(s): DDIMER in the last 72 hours. Hgb A1c Recent Labs    02/26/24 0757  HGBA1C 5.4   Lipid Profile No results for input(s): CHOL, HDL, LDLCALC, TRIG, CHOLHDL, LDLDIRECT in the last 72 hours. Thyroid  function studies No results for input(s): TSH, T4TOTAL, T3FREE, THYROIDAB in the last 72 hours.  Invalid input(s): FREET3 Anemia work up No results for input(s): VITAMINB12, FOLATE, FERRITIN, TIBC, IRON, RETICCTPCT in the last 72 hours. Urinalysis    Component Value Date/Time   COLORURINE YELLOW 02/26/2024 0003   APPEARANCEUR CLOUDY (A) 02/26/2024 0003   LABSPEC 1.015 02/26/2024 0003   PHURINE 7.0 02/26/2024 0003   GLUCOSEU NEGATIVE 02/26/2024 0003   HGBUR TRACE (A) 02/26/2024 0003   BILIRUBINUR NEGATIVE 02/26/2024 0003   BILIRUBINUR  negative 12/07/2023 1549   BILIRUBINUR neg 11/27/2020 0956   KETONESUR NEGATIVE 02/26/2024 0003   PROTEINUR 100 (A) 02/26/2024 0003   UROBILINOGEN 0.2 12/07/2023 1549   NITRITE POSITIVE (A) 02/26/2024 0003   LEUKOCYTESUR LARGE (A) 02/26/2024 0003   Sepsis Labs Recent Labs  Lab 02/25/24 2209 02/27/24 0505 02/28/24 0516  WBC 6.0 6.3 4.7   Microbiology Recent Results (from the past 240 hours)  Culture, blood (Routine x 2)     Status: None (Preliminary result)   Collection Time: 02/25/24 10:20 PM   Specimen: BLOOD  Result Value Ref Range Status   Specimen Description   Final    BLOOD RIGHT ANTECUBITAL Performed at [facility], [address] [place], [place] [address]    Special Requests   Final    BOTTLES DRAWN AEROBIC AND ANAEROBIC Blood Culture adequate volume Performed at [facility], [address] [place], [place] [address]    Culture   Final    NO GROWTH 2 DAYS Performed at [facility], [address], [place], [place] [address]    Report Status PENDING  Incomplete  Culture, blood (Routine x 2)     Status: None (Preliminary result)   Collection Time: 02/25/24 10:25 PM   Specimen: BLOOD  Result Value Ref Range Status   Specimen Description   Final    BLOOD RIGHT ANTECUBITAL Performed at [facility], [address] [place], [place] [address]    Special Requests   Final    BOTTLES DRAWN AEROBIC AND ANAEROBIC Blood Culture adequate volume Performed at [facility], [address] [place], [place] [address]    Culture   Final    NO GROWTH 2 DAYS Performed at [facility], [address], [place], [place] [address]    Report Status PENDING  Incomplete  Urine Culture     Status: Abnormal   Collection Time: 02/26/24 12:03 AM   Specimen: Urine, Random  Result Value Ref Range Status   Specimen Description   Final    URINE, RANDOM Performed at [facility], [address] [place], [place]  [address]    Special Requests   Final    NONE Reflexed from [id number] Performed at [facility], [address] [place], [place] [address]    Culture (A)  Final    <10,000 COLONIES/mL INSIGNIFICANT GROWTH Performed at [facility], [address] [place], [place] [address]    Report Status 02/27/2024 FINAL  Final    Procedures/Studies: CT Angio Abd/Pel W and/or Wo Contrast Result Date: 02/26/2024 EXAM:  CTA ABDOMEN AND PELVIS WITH CONTRAST 02/26/2024 01:02:06 AM TECHNIQUE: CTA images of the abdomen and pelvis with intravenous contrast. 100 mL iohexol  (OMNIPAQUE ) 350 MG/ML injection was administered. Three-dimensional MIP/volume rendered formations were performed. Automated exposure control, iterative reconstruction, and/or weight based adjustment of the mA/kV was utilized to reduce the radiation dose to as low as reasonably achievable. COMPARISON: None available. CLINICAL HISTORY: Abdominal pain and history of sma stenosis on prior CT. FINDINGS: VASCULATURE: AORTA: Diffuse atherosclerotic calcifications are noted without aneurysmal dilatation. Some mural thrombus is seen in a juxtarenal position although it does not appear to be flow limiting. No dissection. CELIAC TRUNK: Widely patent. SUPERIOR MESENTERIC ARTERY: Calcifications are noted at the origin of the SMA. A focal stenosis is noted approximately 15 mm beyond the sma origin secondary to soft atherosclerotic plaque. The previously seen abnormalities were felt to be more related to the timing of the contrast bolus as opposed to true high-grade stenosis of the SMA. The distal SMA shows normal branching. RENAL ARTERIES: Patent. Atherosclerotic calcification is present without aneurysmal dilatation. ILIAC ARTERIES: Atherosclerotic calcification is present without aneurysmal dilatation. VENOUS SYSTEM: No specific venous abnormality is seen. LIVER: The liver demonstrates 2 hypervascular lesions, 1 best seen on image 12 of series 21 and the  second inferiorly on image 23 of series 21. The enhancement pattern is that consistent with hemangioma. These are stable in appearance from the prior exam. GALLBLADDER AND BILE DUCTS: The gallbladder is decompressed. No biliary ductal dilatation. SPLEEN: The spleen is unremarkable. PANCREAS: The pancreas is unremarkable. ADRENAL GLANDS: Bilateral adrenal glands demonstrate no acute abnormality. KIDNEYS, URETERS AND BLADDER: Kidneys demonstrated normal enhancement pattern bilaterally. No renal calculi or obstructive changes are seen. The bladder is well distended. GI AND BOWEL: Stomach and duodenal sweep demonstrate no acute abnormality. The small bowel is within normal limits. Colon shows mild diverticular change without evidence of diverticulitis. The appendix has been surgically removed. No obstructive changes are seen. REPRODUCTIVE: The uterus has been surgically removed. PERITONEUM AND RETRPERITONEUM: No free fluid is noted. No free air. LUNG BASE: Lung bases are free of acute infiltrate or sizable effusion. LYMPH NODES: No lymphadenopathy is seen. BONES AND SOFT TISSUES: No acute abnormality of the bones. No acute soft tissue abnormality. IMPRESSION: 1. 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 No CT evidence of acute mesenteric ischemia. 2. Diffuse aortic atherosclerosis without aneurysmal dilatation, with small juxtarenal mural thrombus that is not flow limiting. No dissection. 3. Stable hypervascular liver lesions consistent with hemangiomas. Electronically signed by: Oneil Devonshire MD 02/26/2024 01:15 AM EST RP Workstation: [id number]   CT Head Wo Contrast Result Date: 02/26/2024 EXAM: CT HEAD WITHOUT CONTRAST 02/26/2024 01:00:00 AM TECHNIQUE: CT of the head was performed without the administration of intravenous contrast. Automated exposure control, iterative reconstruction, and/or weight based adjustment  of the mA/kV was utilized to reduce the radiation dose to as low as reasonably achievable. COMPARISON: None available. CLINICAL HISTORY: Altered mental status, nontraumatic (Ped 0-17y). FINDINGS: BRAIN AND VENTRICLES: Basal ganglia mineralization bilaterally. No acute hemorrhage. No evidence of acute infarct. Mild atrophic changes are noted commensurate with the patient's given age. No mass effect or midline shift. ORBITS: Bilateral lens replacement noted. SINUSES: No acute abnormality. SOFT TISSUES AND SKULL: No acute soft tissue abnormality. No skull fracture. IMPRESSION: 1. No acute intracranial abnormality. Electronically signed by: Oneil Devonshire MD 02/26/2024 01:06 AM EST RP Workstation: [id number]  Time coordinating discharge: Over 30 minutes    Alm Apo, MD  [facility] 02/28/2024, 2:39 PM

## 2024-02-28 NOTE — Progress Notes (Signed)
 Transition of Care [facility]) - Inpatient Brief Assessment   Patient Details  Name: Donna Little MRN: [id number] Date of Birth: [birth year]  Transition of Care [facility]) CM/SW Contact:    Rosaline JONELLE Joe, RN Phone Number: 02/28/2024, 12:01 PM   Clinical Narrative: Patient  admitted to the hospital with UTI.  Patient lives alone and plans to return home when stable.  Patient states that her family assists with her needs at the home as needed.  DME at the home includes RW.  Patient was faxed out in the hub for home health choice and offered Medicare choice at the bedside.  Patient chose [facility] [place] accepted for home health RN, PT, OT.  HH orders placed - to be co-signed by MD.  No other IP Care management needs.  Patient plans to return home by car with family when medically stable.   Transition of Care Asessment: Insurance and Status: (P) Insurance coverage has been reviewed Patient has primary care physician: (P) Yes Home environment has been reviewed: (P) from home alone Prior level of function:: (P) self Prior/Current Home Services: (P) No current home services Social Drivers of Health Review: (P) SDOH reviewed interventions complete Readmission risk has been reviewed: (P) Yes Transition of care needs: (P) transition of care needs identified, TOC will continue to follow

## 2024-02-28 NOTE — Plan of Care (Signed)
  Problem: Clinical Measurements: Goal: Will remain free from infection Outcome: Progressing   Problem: Clinical Measurements: Goal: Diagnostic test results will improve Outcome: Progressing   Problem: Clinical Measurements: Goal: Cardiovascular complication will be avoided Outcome: Progressing   Problem: Safety: Goal: Ability to remain free from injury will improve Outcome: Progressing

## 2024-02-29 ENCOUNTER — Telehealth: Payer: Self-pay

## 2024-02-29 NOTE — Transitions of Care (Post Inpatient/ED Visit) (Signed)
   02/29/2024  Name: LINDSI BAYLISS MRN: [id number] DOB: 1945/08/05  Today's TOC FU Call Status: Today's TOC FU Call Status:: Unsuccessful Call (2nd Attempt) Unsuccessful Call (2nd Attempt) Date: 02/29/24  Attempted to reach the patient regarding the most recent Inpatient/ED visit.  Follow Up Plan: Additional outreach attempts will be made to reach the patient to complete the Transitions of Care (Post Inpatient/ED visit) call.   [name] RN, CCM   [facility]Population [facility] RN Care Manager [phone]

## 2024-02-29 NOTE — Telephone Encounter (Signed)
 I called [facility] and I gave verbal that Dr Sherre will be the attending physician.   Copied from CRM [id number]. Topic: Clinical - Home Health Verbal Orders >> Feb 29, 2024  1:57 PM Rosaria BRAVO wrote: Caller/Agency: Glade from [facility] Number: [phone] Service Requested: Nursing, PT/OT  Wants to know if PCP would be willing to be the attending physician for their services. They need verbal confirmation.

## 2024-02-29 NOTE — Transitions of Care (Post Inpatient/ED Visit) (Signed)
   02/29/2024  Name: Donna Little MRN: [id number] DOB: Jan 05, 1946  Today's TOC FU Call Status: Today's TOC FU Call Status:: Unsuccessful Call (1st Attempt) Unsuccessful Call (1st Attempt) Date: 02/29/24  Attempted to reach the patient regarding the most recent Inpatient/ED visit.  Follow Up Plan: Additional outreach attempts will be made to reach the patient to complete the Transitions of Care (Post Inpatient/ED visit) call.   [name] RN, CCM [place]  [facility]BC[facility]Population Health RN Care Manager [phone]

## 2024-03-01 ENCOUNTER — Ambulatory Visit: Admitting: Vascular Surgery

## 2024-03-01 ENCOUNTER — Ambulatory Visit (HOSPITAL_COMMUNITY)

## 2024-03-01 ENCOUNTER — Encounter: Admitting: Vascular Surgery

## 2024-03-01 ENCOUNTER — Telehealth: Payer: Self-pay

## 2024-03-01 NOTE — Transitions of Care (Post Inpatient/ED Visit) (Signed)
   03/01/2024  Name: Donna Little MRN: [id number] DOB: 06/02/1945  Today's TOC FU Call Status: Today's TOC FU Call Status:: Successful TOC FU Call Completed TOC FU Call Complete Date: 03/01/24 (Spoke with patient who declined TOC call/program and states she has everything she needs and son visits every morning on his way to work and son manages meds and states, Sometimes I think I have too much help.)  Patient's Name and Date of Birth confirmed. DOB, Name  [name] RN, CCM [place]  [facility]BC[facility]Population Health RN Care Manager [phone]

## 2024-03-02 LAB — CULTURE, BLOOD (ROUTINE X 2)
Culture: NO GROWTH
Culture: NO GROWTH
Special Requests: ADEQUATE
Special Requests: ADEQUATE

## 2024-03-05 ENCOUNTER — Encounter: Payer: Self-pay | Admitting: Family Medicine

## 2024-03-07 ENCOUNTER — Other Ambulatory Visit: Payer: Self-pay | Admitting: Family Medicine

## 2024-03-07 DIAGNOSIS — M5432 Sciatica, left side: Secondary | ICD-10-CM

## 2024-03-08 ENCOUNTER — Telehealth: Payer: Self-pay

## 2024-03-08 NOTE — Telephone Encounter (Signed)
 Spoke with Luke and let her know the below orders are okay to move forward with.   Copied from CRM [id number]. Topic: Clinical - Home Health Verbal Orders >> Mar 08, 2024  2:42 PM Donna BRAVO wrote: Caller/Agency: [facility]  Callback Number: [phone]  Service Requested: Physical Therapy  Frequency: 1 week 4,   once every other week for 5    physical therapy eveualtions   Any new concerns about the patient? No   approval for plan of care for nursing and physical therapy

## 2024-03-09 ENCOUNTER — Ambulatory Visit: Admitting: Family Medicine

## 2024-03-09 ENCOUNTER — Encounter: Payer: Self-pay | Admitting: Family Medicine

## 2024-03-09 VITALS — BP 112/62 | HR 79 | Temp 99.2°F | Resp 16 | Ht 61.5 in | Wt 120.4 lb

## 2024-03-09 DIAGNOSIS — K551 Chronic vascular disorders of intestine: Secondary | ICD-10-CM | POA: Diagnosis not present

## 2024-03-09 DIAGNOSIS — N3000 Acute cystitis without hematuria: Secondary | ICD-10-CM

## 2024-03-09 DIAGNOSIS — R197 Diarrhea, unspecified: Secondary | ICD-10-CM

## 2024-03-09 DIAGNOSIS — J029 Acute pharyngitis, unspecified: Secondary | ICD-10-CM | POA: Diagnosis not present

## 2024-03-09 DIAGNOSIS — E876 Hypokalemia: Secondary | ICD-10-CM

## 2024-03-09 DIAGNOSIS — G9341 Metabolic encephalopathy: Secondary | ICD-10-CM
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
## 2024-03-10 ENCOUNTER — Encounter: Payer: Self-pay | Admitting: Family Medicine

## 2024-03-10 ENCOUNTER — Ambulatory Visit: Payer: Self-pay | Admitting: Family Medicine

## 2024-03-10 LAB — COMPREHENSIVE METABOLIC PANEL WITH GFR
ALT: 13 IU/L (ref 0–32)
AST: 23 IU/L (ref 0–40)
Albumin: 4.1 g/dL (ref 3.8–4.8)
Alkaline Phosphatase: 72 IU/L (ref 49–135)
BUN/Creatinine Ratio: 10 — ABNORMAL LOW (ref 12–28)
BUN: 11 mg/dL (ref 8–27)
Bilirubin Total: 0.3 mg/dL (ref 0.0–1.2)
CO2: 27 mmol/L (ref 20–29)
Calcium: 8.4 mg/dL — ABNORMAL LOW (ref 8.7–10.3)
Chloride: 102 mmol/L (ref 96–106)
Creatinine, Ser: 1.06 mg/dL — ABNORMAL HIGH (ref 0.57–1.00)
Globulin, Total: 1.7 g/dL (ref 1.5–4.5)
Glucose: 97 mg/dL (ref 70–99)
Potassium: 4.3 mmol/L (ref 3.5–5.2)
Sodium: 142 mmol/L (ref 134–144)
Total Protein: 5.8 g/dL — ABNORMAL LOW (ref 6.0–8.5)
eGFR: 53 mL/min/1.73 — ABNORMAL LOW (ref >59)

## 2024-03-12 DIAGNOSIS — R197 Diarrhea, unspecified: Secondary | ICD-10-CM | POA: Insufficient documentation

## 2024-03-12 DIAGNOSIS — N3000 Acute cystitis without hematuria: Secondary | ICD-10-CM | POA: Insufficient documentation

## 2024-03-12 DIAGNOSIS — K551 Chronic vascular disorders of intestine: Secondary | ICD-10-CM | POA: Insufficient documentation

## 2024-03-12 DIAGNOSIS — J029 Acute pharyngitis, unspecified: Secondary | ICD-10-CM | POA: Insufficient documentation

## 2024-03-12 NOTE — Assessment & Plan Note (Signed)
 Completed antibiotics

## 2024-03-12 NOTE — Assessment & Plan Note (Signed)
 Resolved

## 2024-03-12 NOTE — Assessment & Plan Note (Signed)
 Mild mesenteric artery narrowing noted on CT. Not primary cause of symptoms but requires monitoring for potential intestinal ischemia. Discussed risks and potential stenting intervention. - Rescheduled vascular ultrasound for mesenteric artery evaluation. - Coordinated with Dr. Claretta office to confirm ultrasound necessity. He does recommend as this is a better test.

## 2024-03-12 NOTE — Assessment & Plan Note (Signed)
No evidence of infection.  ?Monitor.  ?

## 2024-03-12 NOTE — Assessment & Plan Note (Signed)
 Check chemistry panel (liver and kidney function)  Orders:   Comprehensive metabolic panel with GFR

## 2024-03-12 NOTE — ED Provider Notes (Signed)
 [place] [facility] Provider Note   CSN: [id number] Arrival date & time: 02/25/24  2141     Patient presents with: Emesis and Diarrhea   Donna Little is a 78 y.o. female.   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  Since Wednesday she has ben vomiting and having diarrhea. Family reports she has been more confused, generally weak.  She has not been acting herself. Also notes abdominal pain.  No known trauma. No urinary symptoms. No black or bloody stool. Unknown if fever  Past Medical History:  Diagnosis Date   A-fib (HCC)    Atherosclerosis of both carotid arteries 2022   noted per CT on 05/14/20   Chronic pain syndrome    Chronic systolic heart failure (HCC)    Depression    Emphysema of lung (HCC) 2022   noted per CT scan on 05/14/20   Generalized atherosclerosis    GERD (gastroesophageal reflux disease)    Hypertension    IBS (irritable bowel syndrome)    Melanoma (HCC)    Other specified nontoxic goiter    Palpitations    PMR (polymyalgia rheumatica) 07/04/2020   Polymyalgia rheumatica    Primary pulmonary hypertension (HCC)    Pulmonary embolism (HCC)    Stroke (HCC)    ministroke times 3 Right side weakness   Supraventricular tachycardia    Type 2 diabetes mellitus (HCC)    diet controlled     Prior to Admission medications   Medication Sig Start Date End Date Taking? Authorizing Provider  Acetaminophen  500 MG capsule Take 1,000 mg by mouth every 6 (six) hours as needed for pain (or headaches).   Yes [provider]  albuterol  (VENTOLIN  HFA) 108 (90 Base) MCG/ACT inhaler Inhale 2  puffs into the lungs every 6 (six) hours as needed. 06/03/22  Yes Cox, Kirsten, MD  amLODipine  (NORVASC ) 2.5 MG tablet Take 1 tablet (2.5 mg total) by mouth daily. 11/24/23  Yes Cox, Kirsten, MD  atorvastatin  (LIPITOR) 80 MG tablet Take 1 tablet (80 mg total) by mouth daily. Patient taking differently: Take 80 mg by mouth at bedtime. 06/21/23  Yes Cox, Kirsten, MD  citalopram  (CELEXA ) 10 MG tablet Take 1 tablet (10 mg total) by mouth daily. 01/26/24  Yes Cox, Abigail, MD  clopidogrel  (PLAVIX ) 75 MG tablet Take 1 tablet by mouth once daily 11/11/23  Yes Cox, Kirsten, MD  dicyclomine  (BENTYL ) 20 MG tablet Take 1 tablet (20 mg total) by mouth 4 (four) times daily -  before meals and at bedtime. 09/01/23  Yes Cox, Kirsten, MD  diltiazem  (CARDIZEM  CD) 120 MG 24 hr capsule Take 1 capsule (120 mg total) by mouth daily. Patient taking differently: Take 120 mg by mouth at bedtime. 12/01/23  Yes Cox, Kirsten, MD  gabapentin  (NEURONTIN ) 800 MG tablet Take 1 tablet (800 mg total) by mouth 3 (three) times daily. Patient taking differently: Take 800 mg by mouth 2 (two) times daily. 01/26/24  Yes Cox, Kirsten, MD  ipratropium-albuterol  (DUONEB) 0.5-2.5 (3) MG/3ML SOLN Take 3 mLs by nebulization every 6 (six) hours as needed. 12/07/23  Yes Ival Domino, FNP  levETIRAcetam  (KEPPRA ) 500 MG tablet Take 1 tablet (500  mg total) by mouth 2 (two) times daily. 01/26/24  Yes Cox, Kirsten, MD  morphine  (MS CONTIN ) 15 MG 12 hr tablet Take 1 tablet (15 mg total) by mouth every 12 (twelve) hours. 02/25/24  Yes Cox, Kirsten, MD  nitroGLYCERIN  (NITROSTAT ) 0.4 MG SL tablet Place under the tongue. Patient taking differently: Place 0.4 mg under the tongue every 5 (five) minutes as needed for chest pain.   Yes [provider]  pantoprazole  (PROTONIX ) 40 MG tablet Take 1 tablet (40 mg total) by mouth daily. 01/26/24  Yes Cox, Kirsten, MD  potassium chloride  SA (KLOR-CON  M) 20 MEQ tablet Take 1 tablet (20 mEq total) by mouth 2 (two)  times daily with a meal. Resume on 12/20/2023 01/26/24  Yes Cox, Kirsten, MD  promethazine -dextromethorphan (PROMETHAZINE -DM) 6.25-15 MG/5ML syrup Take 5 mLs by mouth 4 (four) times daily as needed for cough. Do not use and drive - May make drowsy. 12/07/23  Yes Ival Domino, FNP  ranolazine  (RANEXA ) 500 MG 12 hr tablet Take 1 tablet (500 mg total) by mouth 2 (two) times daily. 02/10/24  Yes Cox, Kirsten, MD  Fluticasone-Umeclidin-Vilant (TRELEGY ELLIPTA ) 100-62.5-25 MCG/ACT AEPB Inhale 1 puff into the lungs daily. Inhale into the lungs. 02/23/24   CoxAbigail, MD  furosemide  (LASIX ) 40 MG tablet Take 1 tablet (40 mg total) by mouth 2 (two) times daily. 02/25/24   Sherre Abigail, MD    Allergies: Codeine, Macrobid  [nitrofurantoin ], Methocarbamol, Sertraline, and Tizanidine    Review of Systems  Updated Vital Signs BP 121/89 (BP Location: Left Arm)   Pulse 62   Temp 97.9 F (36.6 C)   Resp 20   Ht 5' 1.5 (1.562 m)   Wt 54.4 kg   LMP  (LMP Unknown)   SpO2 96%   BMI 22.29 kg/m   Physical Exam Vitals and nursing note reviewed.  Constitutional:      General: She is not in acute distress.    Appearance: She is well-developed. She is not diaphoretic.  HENT:     Head: Normocephalic and atraumatic.  Eyes:     Conjunctiva/sclera: Conjunctivae normal.  Cardiovascular:     Rate and Rhythm: Regular rhythm. Tachycardia present.     Heart sounds: Normal heart sounds. No murmur heard.    No friction rub. No gallop.  Pulmonary:     Effort: Pulmonary effort is normal. No respiratory distress.     Breath sounds: Normal breath sounds. No wheezing or rales.  Abdominal:     General: There is no distension.     Palpations: Abdomen is soft.     Tenderness: There is abdominal tenderness. There is no guarding.  Musculoskeletal:        General: No tenderness.     Cervical back: Normal range of motion.  Skin:    General: Skin is warm and dry.     Findings: No erythema or rash.  Neurological:      Mental Status: She is alert.     (all labs ordered are listed, but only abnormal results are displayed) Labs Reviewed  URINE CULTURE - Abnormal; Notable for the following components:      Result Value   Culture   (*)    Value: <10,000 COLONIES/mL INSIGNIFICANT GROWTH Performed at [facility], [address], [place], [place] [address]    All other components within normal limits  COMPREHENSIVE METABOLIC PANEL WITH GFR - Abnormal; Notable for the following components:   Potassium 3.2 (*)    Glucose, Bld  154 (*)    Calcium  8.7 (*)    All other components within normal limits  CBC WITH DIFFERENTIAL/PLATELET - Abnormal; Notable for the following components:   Hemoglobin 16.1 (*)    All other components within normal limits  URINALYSIS, W/ REFLEX TO CULTURE (INFECTION SUSPECTED) - Abnormal; Notable for the following components:   APPearance CLOUDY (*)    Hgb urine dipstick TRACE (*)    Protein, ur 100 (*)    Nitrite POSITIVE (*)    Leukocytes,Ua LARGE (*)    Bacteria, UA MANY (*)    All other components within normal limits  BASIC METABOLIC PANEL WITH GFR - Abnormal; Notable for the following components:   Potassium 3.0 (*)    BUN 7 (*)    Calcium  7.6 (*)    All other components within normal limits  BASIC METABOLIC PANEL WITH GFR - Abnormal; Notable for the following components:   Potassium 5.3 (*)    BUN 7 (*)    Calcium  7.6 (*)    All other components within normal limits  CBC WITH DIFFERENTIAL/PLATELET - Abnormal; Notable for the following components:   Neutro Abs 1.6 (*)    All other components within normal limits  BASIC METABOLIC PANEL WITH GFR - Abnormal; Notable for the following components:   Glucose, Bld 182 (*)    BUN 7 (*)    Calcium  7.5 (*)    All other components within normal limits  CULTURE, BLOOD (ROUTINE X 2)  CULTURE, BLOOD (ROUTINE X 2)  LACTIC ACID, PLASMA  LACTIC ACID, PLASMA  PROTIME-INR  HEMOGLOBIN A1C  CBC WITH DIFFERENTIAL/PLATELET   MAGNESIUM  MAGNESIUM    EKG: None  Radiology: No results found.   Procedures   Medications Ordered in the ED  lactated ringers  bolus 1,000 mL (0 mLs Intravenous Stopped 02/26/24 0010)  piperacillin -tazobactam (ZOSYN ) IVPB 3.375 g (0 g Intravenous Stopped 02/25/24 2303)  iohexol  (OMNIPAQUE ) 350 MG/ML injection 100 mL (100 mLs Intravenous Contrast Given 02/26/24 0030)  ondansetron  (ZOFRAN ) injection 4 mg (4 mg Intravenous Given 02/26/24 0016)  haloperidol  lactate (HALDOL ) injection 2 mg (2 mg Intravenous Given 02/26/24 0149)  cefTRIAXone  (ROCEPHIN ) 1 g in sodium chloride  0.9 % 100 mL IVPB (0 g Intravenous Stopped 02/26/24 0222)  potassium chloride  10 mEq in 100 mL IVPB (0 mEq Intravenous Stopped 02/26/24 1317)  phenazopyridine  (PYRIDIUM ) tablet 200 mg (200 mg Oral Given 02/28/24 1123)  potassium chloride  10 mEq in 100 mL IVPB (10 mEq Intravenous New Bag/Given 02/27/24 1850)  potassium chloride  SA (KLOR-CON  M) CR tablet 40 mEq (40 mEq Oral Given 02/27/24 0840)  sodium chloride  0.9 % bolus 1,000 mL (1,000 mLs Intravenous New Bag/Given 02/28/24 0858)  furosemide  (LASIX ) injection 20 mg (20 mg Intravenous Given 02/28/24 0853)    Clinical Course as of 03/12/24 0558  Sat Feb 26, 2024  0023 Patient requesting nausea medication. UA collected by in-and-out cath with UTI, already give Zosyn . Will add culture. Awaiting CT.  [CS]  0118 I personally viewed the images from radiology studies and agree with radiologist interpretation: CT head and CTA abdomen pelvis without any acute findings.  [CS]  0136 Patient remains hemodynamically stable, but still confused per family at bedside, trying to get out of bed and pulling at IV. Will give a dose of haldol  for agitation and plan admission for further management. Will give a dose of Rocephin  given anticipated delay in getting an inpatient bed.  [CS]  [phone] Spoke with Dr. Sundil who will accept for admission.  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  DDx includes viral gastroenteritis, colitis, mesenteric ischemia, cholecystitis, pyelonephritis , anemia, electrolyte abnormalities, ICH.  Not focal symptoms or findings, doubt CVA.   Given initial tachycardia, concern for encephalopathy, ordered empiric zosyn  for possible intraabdominal infection.   CT head ordered given AMS, CTA ordered given history of SMA narrowing, symptoms of abdominal pain and diarrhea.  Labs completed and evaluated by me show mild hypokalemia, normal renal function, no WBC, normal lactic acid, normal INR.  Signed out with reeval and CTA/ct head/UA pending.     Final diagnoses:  Acute cystitis without hematuria  Altered mental status, unspecified altered mental status type    ED Discharge Orders          Ordered    Increase activity slowly        02/28/24 1438    Diet general        02/28/24 1438               Dreama Longs, MD 03/12/24 [phone]

## 2024-03-13 ENCOUNTER — Telehealth: Payer: Self-pay | Admitting: Family Medicine

## 2024-03-13 NOTE — Telephone Encounter (Signed)
 [facility] [phone]

## 2024-03-16 ENCOUNTER — Telehealth: Payer: Self-pay | Admitting: Family Medicine

## 2024-03-16 ENCOUNTER — Ambulatory Visit (INDEPENDENT_AMBULATORY_CARE_PROVIDER_SITE_OTHER)
Admission: RE | Admit: 2024-03-16 | Discharge: 2024-03-16 | Disposition: A | Discharge status: Home / Self Care | Source: Ambulatory Visit | Attending: Family Medicine | Admitting: Family Medicine

## 2024-03-16 DIAGNOSIS — M48061 Spinal stenosis, lumbar region without neurogenic claudication: Secondary | ICD-10-CM | POA: Diagnosis not present

## 2024-03-16 DIAGNOSIS — M5432 Sciatica, left side: Secondary | ICD-10-CM | POA: Diagnosis not present

## 2024-03-16 DIAGNOSIS — M5115 Intervertebral disc disorders with radiculopathy, thoracolumbar region: Secondary | ICD-10-CM | POA: Diagnosis not present

## 2024-03-16 DIAGNOSIS — M5116 Intervertebral disc disorders with radiculopathy, lumbar region: Secondary | ICD-10-CM | POA: Diagnosis not present

## 2024-03-16 DIAGNOSIS — M4856XA Collapsed vertebra, not elsewhere classified, lumbar region, initial encounter for fracture: Secondary | ICD-10-CM | POA: Diagnosis not present

## 2024-03-16 MED ORDER — GADOBUTROL 1 MMOL/ML IV SOLN
6.0000 mL | Freq: Once | INTRAVENOUS | Status: AC | PRN
  Administered 2024-03-16: 6 mL via INTRAVENOUS

## 2024-03-16 NOTE — Telephone Encounter (Signed)
 [facility] [phone]

## 2024-03-19 ENCOUNTER — Ambulatory Visit: Payer: Self-pay | Admitting: Family Medicine

## 2024-03-21 DIAGNOSIS — K219 Gastro-esophageal reflux disease without esophagitis: Secondary | ICD-10-CM | POA: Diagnosis not present

## 2024-03-21 DIAGNOSIS — J439 Emphysema, unspecified: Secondary | ICD-10-CM | POA: Diagnosis not present

## 2024-03-21 DIAGNOSIS — M5432 Sciatica, left side: Secondary | ICD-10-CM | POA: Diagnosis not present

## 2024-03-21 DIAGNOSIS — I7091 Generalized atherosclerosis: Secondary | ICD-10-CM | POA: Diagnosis not present

## 2024-03-21 DIAGNOSIS — C439 Malignant melanoma of skin, unspecified: Secondary | ICD-10-CM | POA: Diagnosis not present

## 2024-03-21 DIAGNOSIS — F32A Depression, unspecified: Secondary | ICD-10-CM | POA: Diagnosis not present

## 2024-03-21 DIAGNOSIS — E876 Hypokalemia: Secondary | ICD-10-CM | POA: Diagnosis not present

## 2024-03-21 DIAGNOSIS — E785 Hyperlipidemia, unspecified: Secondary | ICD-10-CM | POA: Diagnosis not present

## 2024-03-21 DIAGNOSIS — I110 Hypertensive heart disease with heart failure: Secondary | ICD-10-CM | POA: Diagnosis not present

## 2024-03-21 DIAGNOSIS — E1165 Type 2 diabetes mellitus with hyperglycemia: Secondary | ICD-10-CM | POA: Diagnosis not present

## 2024-03-21 DIAGNOSIS — Z9181 History of falling: Secondary | ICD-10-CM | POA: Diagnosis not present

## 2024-03-22 DIAGNOSIS — M0600 Rheumatoid arthritis without rheumatoid factor, unspecified site: Secondary | ICD-10-CM | POA: Diagnosis not present

## 2024-03-23 ENCOUNTER — Other Ambulatory Visit: Payer: Self-pay | Admitting: Family Medicine

## 2024-03-23 DIAGNOSIS — M48061 Spinal stenosis, lumbar region without neurogenic claudication: Secondary | ICD-10-CM | POA: Insufficient documentation

## 2024-03-23 DIAGNOSIS — M5416 Radiculopathy, lumbar region: Secondary | ICD-10-CM | POA: Insufficient documentation

## 2024-03-23 DIAGNOSIS — G8929 Other chronic pain: Secondary | ICD-10-CM

## 2024-03-23 NOTE — Progress Notes (Signed)
Referral sent. Dr. Rhen Kawecki  

## 2024-03-31 ENCOUNTER — Other Ambulatory Visit: Payer: Self-pay

## 2024-03-31 ENCOUNTER — Telehealth: Payer: Self-pay | Admitting: Family Medicine

## 2024-03-31 DIAGNOSIS — G8929 Other chronic pain: Secondary | ICD-10-CM

## 2024-03-31 MED ORDER — MORPHINE SULFATE ER 15 MG PO TBCR: 15.0000 mg | EXTENDED_RELEASE_TABLET | Freq: Two times a day (BID) | ORAL | 0 refills | Status: DC

## 2024-03-31 NOTE — Telephone Encounter (Signed)
Rx refill sent to provider 

## 2024-03-31 NOTE — Telephone Encounter (Signed)
 Prescription Request  03/31/2024  LOV: 03/09/2024  What is the name of the medication or equipment? morphine  (MS CONTIN ) 15 MG 12 hr tablet   Have you contacted your pharmacy to request a refill? No   Which pharmacy would you like this sent to?    [id number] - [place], [place] - [address] AT [facility] [address] [facility] & [address] [place] [address] Phone: [phone] Fax: [phone]   Patient notified that their request is being sent to the clinical staff for review and that they should receive a response within 2 business days.   Please advise at [facility] [phone]

## 2024-04-03 ENCOUNTER — Telehealth: Payer: Self-pay | Admitting: Family Medicine

## 2024-04-03 NOTE — Telephone Encounter (Signed)
 [facility] [phone]

## 2024-04-04 ENCOUNTER — Other Ambulatory Visit: Payer: Self-pay | Admitting: Family Medicine

## 2024-04-04 NOTE — Telephone Encounter (Signed)
 Copied from CRM [id number]. Topic: Clinical - Medication Refill >> Apr 04, 2024 12:05 PM Pinkey ORN wrote: Medication: diltiazem  (CARDIZEM  CD) 120 MG 24 hr capsule  Has the patient contacted their pharmacy? Yes (Agent: If no, request that the patient contact the pharmacy for the refill. If patient does not wish to contact the pharmacy document the reason why and proceed with request.) (Agent: If yes, when and what did the pharmacy advise?)  This is the patient's preferred pharmacy:   [facility] [address], [place] - [address] [place] [address] Phone: [phone] Fax: [phone]  Is this the correct pharmacy for this prescription? Yes If no, delete pharmacy and type the correct one.   Has the prescription been filled recently? No  Is the patient out of the medication? Yes  Has the patient been seen for an appointment in the last year OR does the patient have an upcoming appointment? Yes  Can we respond through MyChart? Yes  Agent: Please be advised that Rx refills may take up to 3 business days. We ask that you follow-up with your pharmacy.

## 2024-04-05 ENCOUNTER — Encounter: Payer: Self-pay | Admitting: Family Medicine

## 2024-04-05 ENCOUNTER — Telehealth: Payer: Self-pay

## 2024-04-05 MED ORDER — DILTIAZEM HCL ER COATED BEADS 120 MG PO CP24: 120.0000 mg | ORAL_CAPSULE | Freq: Every day | ORAL | 1 refills | Status: AC

## 2024-04-05 NOTE — Telephone Encounter (Signed)
 The last time this happened. Patient did not go to ED and became very sick. She needs to go.  Offer to call her son or daughter to take her to the hospital.  Dr. Sherre

## 2024-04-05 NOTE — Telephone Encounter (Signed)
 Patient declined to go to the ED states she has too much going on and currently cooking for everybody. States she gets sick like this every year and can not go.

## 2024-04-05 NOTE — Telephone Encounter (Signed)
 Patient came on 03/09/2024 for Diarrhea and UTI. She still has diarrhea and she states everything I eat is going out, so I am not eating. She said that she has history of IBS and diverticulititis and she does not have transportation, so she asked if can you send antibiotics. Her left side of her abdomen is hurting and some swelling or a lot of gas She will really appreciate it, she stated. Please advice

## 2024-04-12 ENCOUNTER — Other Ambulatory Visit (HOSPITAL_BASED_OUTPATIENT_CLINIC_OR_DEPARTMENT_OTHER): Admitting: Radiology

## 2024-04-20 ENCOUNTER — Other Ambulatory Visit: Payer: Self-pay | Admitting: Family Medicine

## 2024-04-20 DIAGNOSIS — I110 Hypertensive heart disease with heart failure: Secondary | ICD-10-CM

## 2024-04-20 MED ORDER — FUROSEMIDE 40 MG PO TABS: 40.0000 mg | ORAL_TABLET | Freq: Two times a day (BID) | ORAL | 2 refills | Status: DC

## 2024-04-20 NOTE — Telephone Encounter (Signed)
 Copied from CRM [id number]. Topic: Clinical - Medication Refill >> Apr 20, 2024  8:46 AM Joesph B wrote: Medication: furosemide  (LASIX ) 40 MG tablet  Has the patient contacted their pharmacy? Yes (Agent: If no, request that the patient contact the pharmacy for the refill. If patient does not wish to contact the pharmacy document the reason why and proceed with request.) (Agent: If yes, when and what did the pharmacy advise?)  This is the patient's preferred pharmacy:   [facility] [address] [place] - [address] [place] [address] Phone: [phone] Fax: [phone]  Is this the correct pharmacy for this prescription? Yes If no, delete pharmacy and type the correct one.   Has the prescription been filled recently? Yes  Is the patient out of the medication? Yes  Has the patient been seen for an appointment in the last year OR does the patient have an upcoming appointment? Yes  Can we respond through MyChart? Yes  Agent: Please be advised that Rx refills may take up to 3 business days. We ask that you follow-up with your pharmacy.

## 2024-05-03 ENCOUNTER — Telehealth: Payer: Self-pay

## 2024-05-03 ENCOUNTER — Telehealth: Payer: Self-pay | Admitting: Family Medicine

## 2024-05-03 DIAGNOSIS — I110 Hypertensive heart disease with heart failure: Secondary | ICD-10-CM

## 2024-05-03 MED ORDER — FUROSEMIDE 40 MG PO TABS: 40.0000 mg | ORAL_TABLET | Freq: Two times a day (BID) | ORAL | 2 refills | Status: AC

## 2024-05-03 NOTE — Telephone Encounter (Signed)
 Patient's son came into the office and requested a refill on Furosemide  to be sent to [facility] and he requested a 60 day supply instead of 30  Also needs Morphine  sent to [facility] on [address]

## 2024-05-03 NOTE — Telephone Encounter (Addendum)
 Called patient and asked about the fall. She stated ' I am ok.  She mentioned that she has been with diarrhea for 3 days. She has been drinking plenty of fluids. I offered an appointment, but she refused.   Please advice  Copied from CRM [id number]. Topic: Clinical - Medication Question >> May 02, 2024  3:22 PM Sophia H wrote: Reason for CRM: Cindi - nurse with [facility] is calling in. She is currently with the patient and states the patient has loose stool that started yesterday, only meds she has is imodium AD and HH nurse is wanting to know if something else can be called in for the patient to help out. Also wanted to advise that patient had a fall the night before last, has some bruising to her left leg but patient states she is okay.   Please advise Cindi # [phone]   [facility] [address], [place] - [address]

## 2024-05-03 NOTE — Telephone Encounter (Signed)
 Rx has ben sent to pharmacy at this time.

## 2024-05-04 ENCOUNTER — Other Ambulatory Visit: Payer: Self-pay | Admitting: Family Medicine

## 2024-05-04 DIAGNOSIS — G8929 Other chronic pain: Secondary | ICD-10-CM

## 2024-05-04 MED ORDER — MORPHINE SULFATE ER 15 MG PO TBCR: 15.0000 mg | EXTENDED_RELEASE_TABLET | Freq: Two times a day (BID) | ORAL | 0 refills | Status: AC

## 2024-05-12 ENCOUNTER — Telehealth: Payer: Self-pay | Admitting: Family Medicine

## 2024-05-12 NOTE — Telephone Encounter (Signed)
 [facility] [phone]

## 2024-06-07 ENCOUNTER — Ambulatory Visit: Admitting: Family Medicine

## 2024-08-23 ENCOUNTER — Ambulatory Visit
